# Patient Record
Sex: Male | Born: 1955 | Race: White | Hispanic: No | Marital: Married | Smoking: Former smoker
Health system: Southern US, Community
[De-identification: ages and names within clinical notes are randomized; demographics above are authoritative.]

## PROBLEM LIST (undated history)

## (undated) ENCOUNTER — Emergency Department

## (undated) DIAGNOSIS — E785 Hyperlipidemia, unspecified: Secondary | ICD-10-CM

## (undated) DIAGNOSIS — I2510 Atherosclerotic heart disease of native coronary artery without angina pectoris: Secondary | ICD-10-CM

## (undated) DIAGNOSIS — Z974 Presence of external hearing-aid: Secondary | ICD-10-CM

## (undated) DIAGNOSIS — L219 Seborrheic dermatitis, unspecified: Secondary | ICD-10-CM

## (undated) DIAGNOSIS — H903 Sensorineural hearing loss, bilateral: Secondary | ICD-10-CM

## (undated) DIAGNOSIS — I5189 Other ill-defined heart diseases: Secondary | ICD-10-CM

## (undated) DIAGNOSIS — I340 Nonrheumatic mitral (valve) insufficiency: Secondary | ICD-10-CM

## (undated) DIAGNOSIS — E43 Unspecified severe protein-calorie malnutrition: Secondary | ICD-10-CM

## (undated) DIAGNOSIS — R1310 Dysphagia, unspecified: Secondary | ICD-10-CM

## (undated) DIAGNOSIS — M0579 Rheumatoid arthritis with rheumatoid factor of multiple sites without organ or systems involvement: Secondary | ICD-10-CM

## (undated) DIAGNOSIS — J84112 Idiopathic pulmonary fibrosis: Secondary | ICD-10-CM

## (undated) DIAGNOSIS — M359 Systemic involvement of connective tissue, unspecified: Secondary | ICD-10-CM

## (undated) DIAGNOSIS — J432 Centrilobular emphysema: Secondary | ICD-10-CM

## (undated) DIAGNOSIS — B749 Filariasis, unspecified: Secondary | ICD-10-CM

## (undated) DIAGNOSIS — J439 Emphysema, unspecified: Secondary | ICD-10-CM

## (undated) DIAGNOSIS — R0600 Dyspnea, unspecified: Secondary | ICD-10-CM

## (undated) DIAGNOSIS — M1990 Unspecified osteoarthritis, unspecified site: Secondary | ICD-10-CM

## (undated) DIAGNOSIS — J449 Chronic obstructive pulmonary disease, unspecified: Secondary | ICD-10-CM

## (undated) DIAGNOSIS — J849 Interstitial pulmonary disease, unspecified: Secondary | ICD-10-CM

## (undated) DIAGNOSIS — J99 Respiratory disorders in diseases classified elsewhere: Secondary | ICD-10-CM

## (undated) HISTORY — DX: Unspecified osteoarthritis, unspecified site: M19.90

## (undated) HISTORY — PX: SINUS SURGERY WITH INSTATRAK: SHX5215

---

## 1995-02-26 DIAGNOSIS — F17200 Nicotine dependence, unspecified, uncomplicated: Secondary | ICD-10-CM | POA: Insufficient documentation

## 2003-03-30 DIAGNOSIS — Z9889 Other specified postprocedural states: Secondary | ICD-10-CM | POA: Insufficient documentation

## 2003-03-30 DIAGNOSIS — R079 Chest pain, unspecified: Secondary | ICD-10-CM | POA: Insufficient documentation

## 2006-06-29 ENCOUNTER — Emergency Department: Payer: Self-pay | Admitting: Emergency Medicine

## 2017-05-07 ENCOUNTER — Ambulatory Visit (INDEPENDENT_AMBULATORY_CARE_PROVIDER_SITE_OTHER): Payer: BC Managed Care – PPO | Admitting: Family Medicine

## 2017-05-07 ENCOUNTER — Encounter: Payer: Self-pay | Admitting: Family Medicine

## 2017-05-07 VITALS — BP 135/87 | HR 61 | Temp 98.6°F | Resp 16 | Ht 71.0 in | Wt 185.6 lb

## 2017-05-07 DIAGNOSIS — M150 Primary generalized (osteo)arthritis: Secondary | ICD-10-CM

## 2017-05-07 DIAGNOSIS — Z1159 Encounter for screening for other viral diseases: Secondary | ICD-10-CM | POA: Diagnosis not present

## 2017-05-07 DIAGNOSIS — Z125 Encounter for screening for malignant neoplasm of prostate: Secondary | ICD-10-CM | POA: Diagnosis not present

## 2017-05-07 DIAGNOSIS — Z23 Encounter for immunization: Secondary | ICD-10-CM

## 2017-05-07 DIAGNOSIS — Z7689 Persons encountering health services in other specified circumstances: Secondary | ICD-10-CM

## 2017-05-07 DIAGNOSIS — E7800 Pure hypercholesterolemia, unspecified: Secondary | ICD-10-CM | POA: Diagnosis not present

## 2017-05-07 DIAGNOSIS — R7989 Other specified abnormal findings of blood chemistry: Secondary | ICD-10-CM | POA: Diagnosis not present

## 2017-05-07 DIAGNOSIS — E785 Hyperlipidemia, unspecified: Secondary | ICD-10-CM

## 2017-05-07 DIAGNOSIS — A6000 Herpesviral infection of urogenital system, unspecified: Secondary | ICD-10-CM | POA: Insufficient documentation

## 2017-05-07 DIAGNOSIS — A6002 Herpesviral infection of other male genital organs: Secondary | ICD-10-CM | POA: Diagnosis not present

## 2017-05-07 DIAGNOSIS — R799 Abnormal finding of blood chemistry, unspecified: Secondary | ICD-10-CM | POA: Diagnosis not present

## 2017-05-07 DIAGNOSIS — Z87891 Personal history of nicotine dependence: Secondary | ICD-10-CM

## 2017-05-07 DIAGNOSIS — Z114 Encounter for screening for human immunodeficiency virus [HIV]: Secondary | ICD-10-CM | POA: Diagnosis not present

## 2017-05-07 DIAGNOSIS — M159 Polyosteoarthritis, unspecified: Secondary | ICD-10-CM | POA: Insufficient documentation

## 2017-05-07 MED ORDER — ASPIRIN EC 81 MG PO TBEC: 81.0000 mg | DELAYED_RELEASE_TABLET | Freq: Every day | ORAL | Status: DC

## 2017-05-07 NOTE — Assessment & Plan Note (Signed)
Stable, uncomplicated DC'd Acyclovir since not using anymore, no flares Follow-up or notify office if recurrence can send acyclovir or topical if need

## 2017-05-07 NOTE — Assessment & Plan Note (Signed)
Encourage remain smoke / tobacco free Counseling on future lung CA screening low dose CT - meets criteria age 63-74, >30 pack year smoking history, quit 2007, less than 15 years - given patient information to contact [facility] for arranging if interested

## 2017-05-07 NOTE — Progress Notes (Signed)
Subjective:    Patient ID: Lawrence Wells, male    DOB: [birth year], 61 y.o.   MRN: [id number]  Lawrence Wells is a 61 y.o. male presenting on 05/07/2017 for Arthritis and Establish Care  Here to establish care with new PCP locally now that he is retired from working at [employer]. He was previously followed by [place] (Dr Sandford Craze).  HPI   Osteoarthritis Multiple joints, R sided (Knee, Shoulder, elbow) - Reports chronic history of arthritis diagnosed before, has had old injuries, and overuse/repetitive activities with work in [employer], he has been followed by prior PCP and also [employer] for this in past. Has had multiple imaging with X-rays, and last MRI R Knee. No prior surgeries. Has had multiple joint injections R shoulder and multiple in R knee in past some mixed results. 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 No longer taking acyclovir PRN.  Hyperlipidemia / Family History of Heart Disease / MI - last lipid >3-4 years ago, do not have results - Reports no personal cardiac history but has family history both parents passed from MI ages 80 father and 76 mother, other sibling has heart problem. - He has never taken cholesterol med or baby aspirin - Takes MVI daily. Fish Oil stopped recently, was told ineffective - No fam history DM. No personal history of elevated blood sugar - Diet balanced, not following  particular diet - Exercise - walk on treadmill 20-33min 3-4 times weekly at gym  Health Maintenance: - Due for TDap, will receive today - Due for Flu Shot, declines today despite counseling - Due for routine HIV and Hep C screening, will get with lab draw today - Due for colon cancer screening - not interested today - Interested in low dose CT for lung CA screening, at age 38, 1.5 packs per year >30 year smoker and 1 can per day >20-30 years smokeless tobacco user  Past Medical History:  Diagnosis Date  . Arthritis    Past Surgical History:  Procedure Laterality Date  . SINUS SURGERY WITH INSTATRAK     Social History   Social History  . Marital status: Married    Spouse name: Tellis Spivak  . Number of children: N/A  . Years of education: High School   Occupational History  . 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 Smoking status: Former Smoker    Packs/day: 1.50    Years: 30.00    Types: Cigarettes    Quit date: 2007  . Smokeless tobacco: Former Systems developer    Types: [place] date: 2007     Comment: Dip smokeless tobacco >20-30 years  . Alcohol use Yes  . Drug use: No  . Sexual activity: Not on file   Other Topics Concern  . Not on file   Social History  Narrative  . No narrative on file   Family History  Problem Relation Age of Onset  . Heart disease Mother   . Heart attack Mother 39  . Heart disease Father   . Heart attack Father 55  . Hyperlipidemia Brother   . Throat cancer Brother 60   No current outpatient prescriptions on file prior to visit.   No current facility-administered medications on file prior to visit.     Review of Systems  Constitutional: Negative for activity change, appetite change, chills, diaphoresis, fatigue and fever.  HENT: Positive for hearing loss (Gradual R ear). Negative for congestion, sinus pressure, tinnitus and voice change.   Eyes: Negative for visual disturbance.  Respiratory:  Negative for apnea, cough, choking, chest tightness, shortness of breath and wheezing.   Cardiovascular: Negative for chest pain, palpitations and leg swelling.  Gastrointestinal: Negative for abdominal pain, anal bleeding, blood in stool, constipation, diarrhea, nausea and vomiting.  Endocrine: Negative for cold intolerance and polyuria.  Genitourinary: Negative for difficulty urinating, dysuria, frequency, hematuria and urgency.       Nocturia 1x nightly  Musculoskeletal: Positive for arthralgias (R shoulder, elbow, hip, knee). Negative for back pain, myalgias and neck pain.  Skin: Negative for rash.  Allergic/Immunologic: Negative for environmental allergies.  Neurological: Negative for dizziness, weakness, light-headedness, numbness and headaches.  Hematological: Negative for adenopathy.  Psychiatric/Behavioral: Negative for behavioral problems, dysphoric mood and sleep disturbance. The patient is not nervous/anxious.    Per HPI unless specifically indicated above     Objective:    BP 135/87   Pulse 61   Temp 98.6 F (37 C) (Oral)   Resp 16   Ht 5\' 11"  (1.803 m)   Wt 185 lb 9.6 oz (84.2 kg)   BMI 25.89 kg/m   Wt Readings from Last 3 Encounters:  05/07/17 185 lb 9.6 oz (84.2 kg)    Physical Exam  Constitutional: He is oriented to person, place, and time. He appears well-developed and well-nourished. No distress.  Well-appearing, comfortable, cooperative  HENT:  Head: Normocephalic and atraumatic.  Mouth/Throat: Oropharynx is clear and moist.  Frontal / maxillary sinuses non-tender. Nares patent without purulence or edema. Bilateral TMs clear without erythema, effusion or bulging. Oropharynx clear without erythema, exudates, edema or asymmetry.  Eyes: Pupils are equal, round, and reactive to light. Conjunctivae and EOM are normal. Right eye exhibits no discharge. Left eye exhibits no discharge.  Neck: Normal range of motion. Neck supple. No thyromegaly present.  No carotid  bruits  Cardiovascular: Normal rate, regular rhythm, normal heart sounds and intact distal pulses.   No murmur heard. Pulmonary/Chest: Effort normal and breath sounds normal. No respiratory distress. He has no wheezes. He has no rales.  Abdominal: Soft. Bowel sounds are normal. He exhibits no distension and no mass. There is no tenderness.  Musculoskeletal: Normal range of motion. He exhibits no edema or tenderness.  Upper / Lower Extremities: - Normal muscle tone, strength bilateral upper extremities 5/5, lower extremities 5/5  No significant joint deformity or effusion. Mild bulky appearance R>L Knee  Lymphadenopathy:    He has no cervical adenopathy.  Neurological: He is alert and oriented to person, place, and time.  Distal sensation intact to light touch all extremities  Skin: Skin is warm and dry. No rash noted. He is not diaphoretic. No erythema.  Psychiatric: He has a normal mood and affect. His behavior is normal.  Well groomed, good eye contact, normal speech and thoughts  Nursing note and vitals  reviewed.  No results found for this or any previous visit.    Assessment & Plan:   Problem List Items Addressed This Visit    Screening for prostate cancer    Low / Avg risk patient, with prior reported negative screening - Last PSA unknown - No prior DRE - Clinically asymptomatic  Plan: 1. Reviewed prostate cancer screening guidelines and risks including potential prostate biopsy if abnormal PSA, proceed with yearly PSA for now, and anticipate DRE as needed especially if abnormal PSA or new symptoms      Relevant Orders   PSA, Total with Reflex to PSA, Free   Osteoarthritis of multiple joints - Primary    Stable, chronic arthritis in multiple joints, mostly R sided knee, elbow, shoulder Known OA/DJD, prior x-rays and MRI R knee, prior injections, last seen [facility] 2 years ago Inadequate conservative therapy  Plan: 1. Discussed arthritis management, prognosis 2.  Recommend increased Tylenol dosing Ext Str / Arthritis str 1-2 tabs up to TID more often for daily symptoms, and then use NSAID in future for PRN flares only, can use Ibuprofen or Aleve OTC 3. Future consider rx NSAID meloxicam or naproxen for less pill burden, again PRN only use - pending labs to check Cr level 4. Future consider muscle relaxant or other therapies 5. Follow-up as needed, may need X-ray updated, consider future steroid injections knee/shoulder PRN, may follow-up with PT or Ortho if needed      Relevant Medications   ibuprofen (ADVIL,MOTRIN) 200 MG tablet   aspirin EC 81 MG tablet   Other Relevant Orders   COMPLETE METABOLIC PANEL WITH GFR   Hypercholesterolemia    History of HLD Will check fasting lipids today Review ASCD risk and recommendations Today start ASA 81mg  EC daily for reducing risk, future consider statin - especially given fam history CAD/MI      Relevant Medications   aspirin EC 81 MG tablet   Genital herpes    Stable, uncomplicated DC'd Acyclovir since not using anymore, no flares Follow-up or notify office if recurrence can send acyclovir or topical if need      Former smoker    Encourage remain smoke / tobacco free Counseling on future lung CA screening low dose CT - meets criteria age 61-74, >30 pack year smoking history, quit 2007, less than 15 years - given patient information to contact [facility] for arranging if interested       Other Visit Diagnoses    Encounter to establish care with new doctor       Need for diphtheria-tetanus-pertussis (Tdap) vaccine       Relevant Orders   Tdap vaccine greater than or equal to 7yo IM (Completed)   Dyslipidemia       Relevant Orders   Hemoglobin A1c   Lipid panel   Abnormal CBC       Relevant Orders   CBC with Differential/Platelet   Abnormal blood chemistry       Relevant Orders   COMPLETE METABOLIC PANEL WITH GFR   Hemoglobin A1c   Screening for HIV (human immunodeficiency virus)        Relevant Orders   HIV antibody   Need for hepatitis C screening test       Relevant Orders   Hepatitis C antibody      Meds ordered this encounter  Medications  . DISCONTD: acyclovir (ZOVIRAX) 800 MG tablet    Sig: Take 800 mg by mouth.  . Multiple Vitamin (MULTIVITAMIN) tablet  Sig: Take 1 tablet by mouth daily.  Marland Kitchen ibuprofen (ADVIL,MOTRIN) 200 MG tablet    Sig: Take 200 mg by mouth every 6 (six) hours as needed.  Marland Kitchen aspirin EC 81 MG tablet    Sig: Take 1 tablet (81 mg total) by mouth daily.    Follow up plan: Return in about 1 year (around 05/07/2018) for Annual Physical.  Nobie Putnam, DO [facility] 05/07/2017, 11:02 AM

## 2017-05-07 NOTE — Assessment & Plan Note (Signed)
Low / Avg risk patient, with prior reported negative screening - Last PSA unknown - No prior DRE - Clinically asymptomatic  Plan: 1. Reviewed prostate cancer screening guidelines and risks including potential prostate biopsy if abnormal PSA, proceed with yearly PSA for now, and anticipate DRE as needed especially if abnormal PSA or new symptoms

## 2017-05-07 NOTE — Assessment & Plan Note (Signed)
History of HLD Will check fasting lipids today Review ASCD risk and recommendations Today start ASA 81mg  EC daily for reducing risk, future consider statin - especially given fam history CAD/MI

## 2017-05-07 NOTE — Assessment & Plan Note (Signed)
Stable, chronic arthritis in multiple joints, mostly R sided knee, elbow, shoulder Known OA/DJD, prior x-rays and MRI R knee, prior injections, last seen [facility] 2 years ago Inadequate conservative therapy  Plan: 1. Discussed arthritis management, prognosis 2. Recommend increased Tylenol dosing Ext Str / Arthritis str 1-2 tabs up to TID more often for daily symptoms, and then use NSAID in future for PRN flares only, can use Ibuprofen or Aleve OTC 3. Future consider rx NSAID meloxicam or naproxen for less pill burden, again PRN only use - pending labs to check Cr level 4. Future consider muscle relaxant or other therapies 5. Follow-up as needed, may need X-ray updated, consider future steroid injections knee/shoulder PRN, may follow-up with PT or Ortho if needed

## 2017-05-07 NOTE — Patient Instructions (Addendum)
Thank you for coming to the clinic today.  1. Will check all screening blood work today, and forward you lab results through [place]  2. May continue Multivitamin daily, do not need fish oil at this time.  3. Recommend to start taking Tylenol Extra Strength 500mg  tabs - take 1 to 2 tabs per dose (max 1000mg ) every 6-8 hours for pain (take regularly, don't skip a dose for next 7 days), max 24 hour daily dose is 6 tablets or 3000mg . In the future you can repeat the same everyday Tylenol course for 1-2 weeks at a time.   Safe to take with Aleve or Ibuprofen - pick your preference, only use one of these as needed for few days, then stop. Only if FLARE of arthritis. In future can change to rx version of these if needed.  4. TDap tetanus shot today, good for 10 years  Consider Low Dose CT Scan for lung cancer screening.  Low Dose Chest CT Lung CA Screening - Age 3-74 - Smoking history >30 pack years - Annual screening for 15 years after quit date  [facility]) [place], RN, OCN [phone]  [place] Smoking Cessation Class Ph: [phone]  Please schedule a Follow-up Appointment to: Return in about 1 year (around 05/07/2018) for Annual Physical.  If you have any other questions or concerns, please feel free to call the clinic or send a message through [place]. You may also schedule an earlier appointment if necessary.  Additionally, you may be receiving a survey about your experience at our clinic within a few days to 1 week by e-mail or mail. We value your feedback.  Lawrence Putnam, DO [place]

## 2017-05-08 LAB — LIPID PANEL
Cholesterol: 259 mg/dL — ABNORMAL HIGH (ref <200)
HDL: 76 mg/dL (ref >40)
LDL Cholesterol (Calc): 156 mg/dL (calc) — ABNORMAL HIGH
Non-HDL Cholesterol (Calc): 183 mg/dL (calc) — ABNORMAL HIGH (ref <130)
TRIGLYCERIDES: 138 mg/dL (ref <150)
Total CHOL/HDL Ratio: 3.4 (calc) (ref <5.0)

## 2017-05-08 LAB — HEMOGLOBIN A1C
EAG (MMOL/L): 6.2 (calc)
Hgb A1c MFr Bld: 5.5 % of total Hgb (ref <5.7)
MEAN PLASMA GLUCOSE: 111 (calc)

## 2017-05-08 LAB — CBC WITH DIFFERENTIAL/PLATELET
BASOS ABS: 69 {cells}/uL (ref 0–200)
Basophils Relative: 0.7 %
Eosinophils Absolute: 238 cells/uL (ref 15–500)
Eosinophils Relative: 2.4 %
HEMATOCRIT: 46.3 % (ref 38.5–50.0)
Hemoglobin: 15.9 g/dL (ref 13.2–17.1)
LYMPHS ABS: 2831 {cells}/uL (ref 850–3900)
MCH: 31.8 pg (ref 27.0–33.0)
MCHC: 34.3 g/dL (ref 32.0–36.0)
MCV: 92.6 fL (ref 80.0–100.0)
MPV: 9.5 fL (ref 7.5–12.5)
Monocytes Relative: 8.2 %
NEUTROS PCT: 60.1 %
Neutro Abs: 5950 cells/uL (ref 1500–7800)
Platelets: 333 10*3/uL (ref 140–400)
RBC: 5 10*6/uL (ref 4.20–5.80)
RDW: 12 % (ref 11.0–15.0)
Total Lymphocyte: 28.6 %
WBC mixed population: 812 cells/uL (ref 200–950)
WBC: 9.9 10*3/uL (ref 3.8–10.8)

## 2017-05-08 LAB — HIV ANTIBODY (ROUTINE TESTING W REFLEX): HIV: NONREACTIVE

## 2017-05-08 LAB — COMPLETE METABOLIC PANEL WITH GFR
AG RATIO: 1.4 (calc) (ref 1.0–2.5)
ALT: 17 U/L (ref 9–46)
AST: 21 U/L (ref 10–35)
Albumin: 4.1 g/dL (ref 3.6–5.1)
Alkaline phosphatase (APISO): 95 U/L (ref 40–115)
BUN: 19 mg/dL (ref 7–25)
CALCIUM: 9.5 mg/dL (ref 8.6–10.3)
CHLORIDE: 102 mmol/L (ref 98–110)
CO2: 24 mmol/L (ref 20–32)
Creat: 0.98 mg/dL (ref 0.70–1.25)
GFR, EST NON AFRICAN AMERICAN: 82 mL/min/{1.73_m2} (ref >=60)
GFR, Est African American: 95 mL/min/{1.73_m2} (ref >=60)
Globulin: 3 g/dL (calc) (ref 1.9–3.7)
Glucose, Bld: 89 mg/dL (ref 65–99)
POTASSIUM: 4.6 mmol/L (ref 3.5–5.3)
Sodium: 137 mmol/L (ref 135–146)
Total Bilirubin: 0.4 mg/dL (ref 0.2–1.2)
Total Protein: 7.1 g/dL (ref 6.1–8.1)

## 2017-05-08 LAB — PSA, TOTAL WITH REFLEX TO PSA, FREE: PSA, TOTAL: 0.6 ng/mL (ref <=4.0)

## 2017-05-08 LAB — HEPATITIS C ANTIBODY
HEP C AB: NONREACTIVE
SIGNAL TO CUT-OFF: 0.04 (ref <1.00)

## 2017-05-09 ENCOUNTER — Other Ambulatory Visit: Payer: Self-pay | Admitting: Family Medicine

## 2017-05-09 ENCOUNTER — Encounter: Payer: Self-pay | Admitting: Family Medicine

## 2017-05-09 DIAGNOSIS — E7800 Pure hypercholesterolemia, unspecified: Secondary | ICD-10-CM

## 2017-05-09 MED ORDER — ROSUVASTATIN CALCIUM 5 MG PO TABS: 5.0000 mg | ORAL_TABLET | Freq: Every day | ORAL | 11 refills | Status: DC

## 2017-06-09 ENCOUNTER — Ambulatory Visit
Admission: RE | Admit: 2017-06-09 | Discharge: 2017-06-09 | Disposition: A | Discharge status: Home / Self Care | Payer: BC Managed Care – PPO | Source: Ambulatory Visit | Attending: Family Medicine | Admitting: Family Medicine

## 2017-06-09 ENCOUNTER — Encounter: Payer: Self-pay | Admitting: Family Medicine

## 2017-06-09 ENCOUNTER — Ambulatory Visit (INDEPENDENT_AMBULATORY_CARE_PROVIDER_SITE_OTHER): Payer: BC Managed Care – PPO | Admitting: Family Medicine

## 2017-06-09 VITALS — BP 124/77 | HR 61 | Temp 98.1°F | Resp 16 | Ht 71.0 in | Wt 186.0 lb

## 2017-06-09 DIAGNOSIS — M5442 Lumbago with sciatica, left side: Secondary | ICD-10-CM | POA: Diagnosis not present

## 2017-06-09 DIAGNOSIS — M48061 Spinal stenosis, lumbar region without neurogenic claudication: Secondary | ICD-10-CM | POA: Insufficient documentation

## 2017-06-09 DIAGNOSIS — M150 Primary generalized (osteo)arthritis: Secondary | ICD-10-CM | POA: Diagnosis not present

## 2017-06-09 DIAGNOSIS — M25551 Pain in right hip: Secondary | ICD-10-CM | POA: Diagnosis not present

## 2017-06-09 DIAGNOSIS — G8929 Other chronic pain: Secondary | ICD-10-CM

## 2017-06-09 DIAGNOSIS — M5441 Lumbago with sciatica, right side: Secondary | ICD-10-CM

## 2017-06-09 DIAGNOSIS — M25552 Pain in left hip: Secondary | ICD-10-CM | POA: Insufficient documentation

## 2017-06-09 DIAGNOSIS — M47895 Other spondylosis, thoracolumbar region: Secondary | ICD-10-CM | POA: Insufficient documentation

## 2017-06-09 DIAGNOSIS — M4807 Spinal stenosis, lumbosacral region: Secondary | ICD-10-CM | POA: Diagnosis not present

## 2017-06-09 DIAGNOSIS — I700 Atherosclerosis of aorta: Secondary | ICD-10-CM | POA: Diagnosis not present

## 2017-06-09 DIAGNOSIS — M159 Polyosteoarthritis, unspecified: Secondary | ICD-10-CM

## 2017-06-09 DIAGNOSIS — J984 Other disorders of lung: Secondary | ICD-10-CM | POA: Diagnosis not present

## 2017-06-09 MED ORDER — PREDNISONE 20 MG PO TABS: ORAL_TABLET | ORAL | 0 refills | Status: DC

## 2017-06-09 MED ORDER — MELOXICAM 15 MG PO TABS: 15.0000 mg | ORAL_TABLET | Freq: Every day | ORAL | 2 refills | Status: DC | PRN

## 2017-06-09 NOTE — Patient Instructions (Addendum)
Thank you for coming to the clinic today.  1.   For your Back and Hip Pain - I think that this is due to Muscle Spasms or strain. Your Sciatic Nerve can be affected causing some of your radiation and numbness down your legs. - Also can affect your Piriformis  - X-ray hips and low back today, stay tuned for more information  2. Start Prednisone (steroid anti-inflammatory) taper over 7 days - HOLD Meloxicam (and all other ibuprofen aleve etc) while on prednisone - Once finished Prednisone then start new rx Meloxicam 15mg  once daily AS NEEDED for flare likely up to 2 weeks to start then as needed. Take with food.  Safe to take with Tylenol Extra Str 500mg  tabs - may take 1-2 tablets per dose every 6-8 hours or 3 times daily, take this regularly for now then as needed  3.  Recommend to start using heating pad on your lower back 1-2x daily for few weeks  This pain may take weeks to months to fully resolve, but hopefully it will respond to the medicine initially. All back injuries (small or serious) are slow to heal since we use our back muscles every day. Be careful with turning, twisting, lifting, sitting / standing for prolonged periods, and avoid re-injury.  If your symptoms significantly worsen with more pain, or new symptoms with weakness in one or both legs, new or different shooting leg pains, numbness in legs or groin, loss of control or retention of urine or bowel movements, please call back for advice and you may need to go directly to the Emergency Department.  Please schedule a Follow-up Appointment to: Return in about 3 months (around 09/09/2017) for arthritis.  If you have any other questions or concerns, please feel free to call the clinic or send a message through [place]. You may also schedule an earlier appointment if necessary.  Additionally, you may be receiving a survey about your experience at our clinic within a few days to 1 week by e-mail or mail. We value your  feedback.  Nobie Putnam, DO [place]

## 2017-06-09 NOTE — Assessment & Plan Note (Signed)
Presumed etiology for underlying bilateral hip and low back pain, with known DJD and history is characteristic of OA without any known other injury - See A&P per hip/back pain

## 2017-06-09 NOTE — Progress Notes (Signed)
Subjective:    Patient ID: Lawrence Wells, male    DOB: Dec 11, 1955, 61 y.o.   MRN: [id number]  Lawrence Wells is a 61 y.o. male presenting on 06/09/2017 for Hip Pain (more on Right side and rediate to both legs onset 2 weeks really worst)   HPI   R > L Hip Pain / Low Back Pain, Chronic / FOLLOW-UP OSTEOARTHRITIS Multiple joints - Last visit with me 05/07/17, for initial visit for similar problem with OA/DJD and joint pain, treated with labs for baseline function and recommend conservative inc dose Tylenol and other treatments, see prior notes for background information. - Interval update with labs were unremarkable, he has admitted to taking frequent Meloxicam 15mg  dose from his wife's rx as needed with notable improvement, as it was not improving on other conservative medications - Today patient reports now recent flare past 1-2 weeks with gradual worsening bilateral low back pain with radiating into hips and back of legs with more sharp stabbing pain brief episodes, question sciatica, some discomfort in gluteal regions, worse with prolonged sitting even for few minutes, improves with standing and change positions, has to find comfortable position, worse long car ride. Last few days sleep on flat back, worse on R side - He is not taking Tylenol regularly - No longer followed by Ortho, in past [facility] only - Known osteoarthritis in multiple joints, no recent hip or back x-rays - Denies any fevers/chills, redness swelling, fall trauma injury, numbness weakness tingling, saddle anesthesia, bowel or bladder incontinence  Additional PMH - has some soreness and stinging in R inguinal area, no bulging, he is asking about possible hernia at end of visit.  Health Maintenance: - Due for Flu Shot, declines today despite counseling on benefits  Depression screen PHQ 2/9 05/07/2017  Decreased Interest 0  Down, Depressed, Hopeless 0  PHQ - 2 Score 0    Social History  Substance Use Topics  .  Smoking status: Former Smoker    Packs/day: 1.50    Years: 30.00    Types: Cigarettes    Quit date: 2007  . Smokeless tobacco: Former Systems developer    Types: [place] date: 2007     Comment: Dip smokeless tobacco >20-30 years  . Alcohol use Yes    Review of Systems Per HPI unless specifically indicated above     Objective:    BP 124/77   Pulse 61   Temp 98.1 F (36.7 C) (Oral)   Resp 16   Ht 5\' 11"  (1.803 m)   Wt 186 lb (84.4 kg)   BMI 25.94 kg/m   Wt Readings from Last 3 Encounters:  06/09/17 186 lb (84.4 kg)  05/07/17 185 lb 9.6 oz (84.2 kg)    Physical Exam  Constitutional: He is oriented to person, place, and time. He appears well-developed and well-nourished. No distress.  Well-appearing, comfortable, cooperative  HENT:  Head: Normocephalic and atraumatic.  Mouth/Throat: Oropharynx is clear and moist.  Eyes: Pupils are equal, round, and reactive to light. Conjunctivae and EOM are normal. Right eye exhibits no discharge. Left eye exhibits no discharge.  Neck: Normal range of motion.  Cardiovascular: Normal rate, regular rhythm, normal heart sounds and intact distal pulses.   No murmur heard. Pulmonary/Chest: Effort normal.  Musculoskeletal: Normal range of motion. He exhibits no edema.  Upper / Lower Extremities: - Normal muscle tone, strength bilateral upper extremities 5/5, lower extremities 5/5  Low Back / Bilateral hip and Greater Trochanter  Inspection: BACK - Normal appearance, no spinal deformity, symmetrical. HIP - Normal appearance, symmetrical, no obvious leg length or pelvis deformity  Palpation: BACK - No tenderness over spinous processes. Bilateral lumbar paraspinal muscles non-tender and without hypertonicity/spasm. Some discomfort on palpation over bilateral piriformis gluteal regions HIP - Hips nontender. Lower extremity thigh calf soft non tender no spasm.  ROM: BACK - Full active ROM forward flex / back extension, rotation L/R without  discomfort HIP - Bilateral hip flex/ext supine normal, internal and external rotation normal without problem or limitation.  Special Testing: BACK - Seated SLR negative for radicular pain bilaterally but with R>L tightness in posterior thigh legs HIP - Full hip ROM was not completed today  Strength: Bilateral hip flex/ext 5/5, knee flex/ext 5/5, ankle dorsiflex/plantarflex 5/5 Neurovascular: intact distal sensation to light touch  Neurological: He is alert and oriented to person, place, and time.  Distal sensation intact to light touch all extremities  Skin: Skin is warm and dry. No rash noted. He is not diaphoretic. No erythema.  Psychiatric: He has a normal mood and affect. His behavior is normal.  Well groomed, good eye contact, normal speech and thoughts  Nursing note and vitals reviewed.    I have personally reviewed the radiology report from Lumbar Spine X-ray and Bilateral Hip X-rays on 06/09/17.  CLINICAL DATA: 61 year old male with chronic bilateral hip pain worse on the right. Symptoms progressing over the past 3 weeks. Pain extends down back legs no injury. Initial encounter.  EXAM: LUMBAR SPINE - COMPLETE 4+ VIEW  COMPARISON: None.  FINDINGS: Minimal curvature convex right.  Mild L4-5 and mild to moderate L5-S1 disc space narrowing.  Mild degenerative changes T12-L1.  Significant lung disease incompletely assessed on present exam.  Vascular calcifications.  IMPRESSION: Mild L4-5 and mild to moderate L5-S1 disc space narrowing.  Mild degenerative changes T12-L1.  Significant lung disease incompletely assessed on present exam.  Aortic Atherosclerosis (ICD10-I70.0).   Electronically Signed By: Genia Del M.D. On: 06/09/2017 18:21 ------------------------------------- CLINICAL DATA:  61 year old male with chronic bilateral hip pain worse on the right. Symptoms progressing over the past 3 weeks. Pain extends down back legs no injury. Initial  encounter.  EXAM: DG HIP (WITH OR WITHOUT PELVIS) 2-3V LEFT  COMPARISON:  None.  FINDINGS: Minimal bilateral hip joint degenerative changes.  No evidence of left femoral head avascular necrosis.  No fracture or dislocation.  IMPRESSION: Minimal bilateral hip joint degenerative changes.   Electronically Signed   By: Genia Del M.D.   On: 06/09/2017 18:23  ----------------------------------------- CLINICAL DATA:  61 year old male with chronic bilateral hip pain worse on the right. Symptoms progressing over the past 3 weeks. Pain extends down back legs no injury. Initial encounter.  EXAM: DG HIP (WITH OR WITHOUT PELVIS) 2-3V RIGHT  COMPARISON:  Left hip films same date dictated separately.  FINDINGS: Minimal right hip joint degenerative changes. No plain film evidence of right femoral head avascular necrosis.  No fracture or dislocation.  IMPRESSION: Minimal right hip joint degenerative changes.   Electronically Signed   By: Genia Del M.D.   On: 06/09/2017 18:25   Results for orders placed or performed in visit on 05/07/17  COMPLETE METABOLIC PANEL WITH GFR  Result Value Ref Range   Glucose, Bld 89 65 - 99 mg/dL   BUN 19 7 - 25 mg/dL   Creat 0.98 0.70 - 1.25 mg/dL   GFR, Est Non African American 82 > OR = 60 mL/min/1.48m2   GFR, Est African  American 95 > OR = 60 mL/min/1.81m2   BUN/Creatinine Ratio NOT APPLICABLE 6 - 22 (calc)   Sodium 137 135 - 146 mmol/L   Potassium 4.6 3.5 - 5.3 mmol/L   Chloride 102 98 - 110 mmol/L   CO2 24 20 - 32 mmol/L   Calcium 9.5 8.6 - 10.3 mg/dL   Total Protein 7.1 6.1 - 8.1 g/dL   Albumin 4.1 3.6 - 5.1 g/dL   Globulin 3.0 1.9 - 3.7 g/dL (calc)   AG Ratio 1.4 1.0 - 2.5 (calc)   Total Bilirubin 0.4 0.2 - 1.2 mg/dL   Alkaline phosphatase (APISO) 95 40 - 115 U/L   AST 21 10 - 35 U/L   ALT 17 9 - 46 U/L  CBC with Differential/Platelet  Result Value Ref Range   WBC 9.9 3.8 - 10.8 Thousand/uL   RBC 5.00  4.20 - 5.80 Million/uL   Hemoglobin 15.9 13.2 - 17.1 g/dL   HCT 46.3 38.5 - 50.0 %   MCV 92.6 80.0 - 100.0 fL   MCH 31.8 27.0 - 33.0 pg   MCHC 34.3 32.0 - 36.0 g/dL   RDW 12.0 11.0 - 15.0 %   Platelets 333 140 - 400 Thousand/uL   MPV 9.5 7.5 - 12.5 fL   Neutro Abs 5,950 1,500 - 7,800 cells/uL   Lymphs Abs 2,831 850 - 3,900 cells/uL   WBC mixed population 812 200 - 950 cells/uL   Eosinophils Absolute 238 15 - 500 cells/uL   Basophils Absolute 69 0 - 200 cells/uL   Neutrophils Relative % 60.1 %   Total Lymphocyte 28.6 %   Monocytes Relative 8.2 %   Eosinophils Relative 2.4 %   Basophils Relative 0.7 %  Hemoglobin A1c  Result Value Ref Range   Hgb A1c MFr Bld 5.5 <5.7 % of total Hgb   Mean Plasma Glucose 111 (calc)   eAG (mmol/L) 6.2 (calc)  Lipid panel  Result Value Ref Range   Cholesterol 259 (H) <200 mg/dL   HDL 76 >40 mg/dL   Triglycerides 138 <150 mg/dL   LDL Cholesterol (Calc) 156 (H) mg/dL (calc)   Total CHOL/HDL Ratio 3.4 <5.0 (calc)   Non-HDL Cholesterol (Calc) 183 (H) <130 mg/dL (calc)  HIV antibody  Result Value Ref Range   HIV 1&2 Ab, 4th Generation NON-REACTIVE NON-REACTI  Hepatitis C antibody  Result Value Ref Range   Hepatitis C Ab NON-REACTIVE NON-REACTI   SIGNAL TO CUT-OFF 0.04 <1.00  PSA, Total with Reflex to PSA, Free  Result Value Ref Range   PSA, Total 0.6 < OR = 4.0 ng/mL      Assessment & Plan:   Problem List Items Addressed This Visit    Chronic bilateral low back pain with bilateral sciatica    Subacute on chronic b/l LBP with associated b/l sciatica. Suspect likely due to muscle spasm/strain and arthritis without known injury or trauma.   No red flag symptoms. Negative SLR for radiculopathy - Inadequate conservative therapy   Plan: - Check lumbar sppine X-rays for baseline - reviewed with mild L4-5 and moderate L5-S1 with disc space narrowing 1. Start Prednisone burst/taper x 7 days - primarily for LBP with sciatica bilateral - HOLD NSAID  until finish prednisone 2. Start new rx Meloxicam 15mg  daily 2-4 weeks then PRN, avoid other NSAIDs 3. Start regular dosing Tylenol Ext Str 500mg  tabs x 2 for 1000mg  up to TID regularly then PRN 4. Considered muscle relaxant vs gabapentin will hold for now 5. Follow-up 6 weeks  to 3 months, consider future referral to PT vs return to ortho      Relevant Medications   predniSONE (DELTASONE) 20 MG tablet   meloxicam (MOBIC) 15 MG tablet   Other Relevant Orders   DG Lumbar Spine Complete (Completed)   Chronic hip pain, bilateral    Subacute on chronic gradual worsening problem with R > L Hip pain, seems more related to LBP and piriformis or sciatica. Not obvious trochanteric bursitis on exam - Known underlying OA/DJD without recent imaging for hips - Suggestive of radicular symptoms - Not responding to conservative therapy  Plan: - Check bilateral hip X-rays and low back for baseline - reviewed with only mild DJD hips 1. Start Prednisone burst/taper x 7 days - primarily for LBP with sciatica bilateral - HOLD NSAID until finish prednisone 2. Start new rx Meloxicam 15mg  daily 2-4 weeks then PRN, avoid other NSAIDs 3. Start regular dosing Tylenol Ext Str 500mg  tabs x 2 for 1000mg  up to TID regularly then PRN 4. Considered muscle relaxant vs gabapentin will hold for now 5. Follow-up 6 weeks to 3 months, consider future troch bursa steroid injection, may return to ortho in future      Relevant Medications   predniSONE (DELTASONE) 20 MG tablet   meloxicam (MOBIC) 15 MG tablet   Other Relevant Orders   DG HIP UNILAT WITH PELVIS 2-3 VIEWS LEFT (Completed)   DG HIP UNILAT WITH PELVIS 2-3 VIEWS RIGHT (Completed)   Osteoarthritis of multiple joints - Primary    Presumed etiology for underlying bilateral hip and low back pain, with known DJD and history is characteristic of OA without any known other injury - See A&P per hip/back pain      Relevant Medications   predniSONE (DELTASONE) 20 MG  tablet   meloxicam (MOBIC) 15 MG tablet   Other Relevant Orders   DG Lumbar Spine Complete (Completed)   DG HIP UNILAT WITH PELVIS 2-3 VIEWS LEFT (Completed)   DG HIP UNILAT WITH PELVIS 2-3 VIEWS RIGHT (Completed)      Meds ordered this encounter  Medications  . predniSONE (DELTASONE) 20 MG tablet    Sig: Take daily with food. Start with 60mg  (3 pills) x 2 days, then reduce to 40mg  (2 pills) x 2 days, then 20mg  (1 pill) x 3 days    Dispense:  13 tablet    Refill:  0  . meloxicam (MOBIC) 15 MG tablet    Sig: Take 1 tablet (15 mg total) by mouth daily as needed for pain. For up to 2 weeks for flares.    Dispense:  30 tablet    Refill:  2    Follow up plan: Return in about 3 months (around 09/09/2017) for arthritis.  Nobie Putnam, DO [facility] 06/09/2017, 10:05 PM

## 2017-06-09 NOTE — Assessment & Plan Note (Signed)
Subacute on chronic gradual worsening problem with R > L Hip pain, seems more related to LBP and piriformis or sciatica. Not obvious trochanteric bursitis on exam - Known underlying OA/DJD without recent imaging for hips - Suggestive of radicular symptoms - Not responding to conservative therapy  Plan: - Check bilateral hip X-rays and low back for baseline - reviewed with only mild DJD hips 1. Start Prednisone burst/taper x 7 days - primarily for LBP with sciatica bilateral - HOLD NSAID until finish prednisone 2. Start new rx Meloxicam 15mg  daily 2-4 weeks then PRN, avoid other NSAIDs 3. Start regular dosing Tylenol Ext Str 500mg  tabs x 2 for 1000mg  up to TID regularly then PRN 4. Considered muscle relaxant vs gabapentin will hold for now 5. Follow-up 6 weeks to 3 months, consider future troch bursa steroid injection, may return to ortho in future

## 2017-06-09 NOTE — Assessment & Plan Note (Signed)
Subacute on chronic b/l LBP with associated b/l sciatica. Suspect likely due to muscle spasm/strain and arthritis without known injury or trauma.   No red flag symptoms. Negative SLR for radiculopathy - Inadequate conservative therapy   Plan: - Check lumbar sppine X-rays for baseline - reviewed with mild L4-5 and moderate L5-S1 with disc space narrowing 1. Start Prednisone burst/taper x 7 days - primarily for LBP with sciatica bilateral - HOLD NSAID until finish prednisone 2. Start new rx Meloxicam 15mg  daily 2-4 weeks then PRN, avoid other NSAIDs 3. Start regular dosing Tylenol Ext Str 500mg  tabs x 2 for 1000mg  up to TID regularly then PRN 4. Considered muscle relaxant vs gabapentin will hold for now 5. Follow-up 6 weeks to 3 months, consider future referral to PT vs return to ortho

## 2017-06-10 ENCOUNTER — Other Ambulatory Visit: Payer: Self-pay | Admitting: Family Medicine

## 2019-07-19 ENCOUNTER — Encounter: Payer: Self-pay | Admitting: Family Medicine

## 2019-07-19 ENCOUNTER — Ambulatory Visit (INDEPENDENT_AMBULATORY_CARE_PROVIDER_SITE_OTHER): Payer: PPO | Admitting: Family Medicine

## 2019-07-19 ENCOUNTER — Other Ambulatory Visit: Payer: Self-pay

## 2019-07-19 VITALS — Ht 71.0 in

## 2019-07-19 DIAGNOSIS — M8949 Other hypertrophic osteoarthropathy, multiple sites: Secondary | ICD-10-CM | POA: Diagnosis not present

## 2019-07-19 DIAGNOSIS — M79641 Pain in right hand: Secondary | ICD-10-CM

## 2019-07-19 DIAGNOSIS — M5441 Lumbago with sciatica, right side: Secondary | ICD-10-CM | POA: Diagnosis not present

## 2019-07-19 DIAGNOSIS — M2560 Stiffness of unspecified joint, not elsewhere classified: Secondary | ICD-10-CM | POA: Diagnosis not present

## 2019-07-19 DIAGNOSIS — M5442 Lumbago with sciatica, left side: Secondary | ICD-10-CM | POA: Diagnosis not present

## 2019-07-19 DIAGNOSIS — M79642 Pain in left hand: Secondary | ICD-10-CM

## 2019-07-19 DIAGNOSIS — E7800 Pure hypercholesterolemia, unspecified: Secondary | ICD-10-CM

## 2019-07-19 DIAGNOSIS — G8929 Other chronic pain: Secondary | ICD-10-CM | POA: Diagnosis not present

## 2019-07-19 DIAGNOSIS — M159 Polyosteoarthritis, unspecified: Secondary | ICD-10-CM

## 2019-07-19 MED ORDER — ROSUVASTATIN CALCIUM 5 MG PO TABS: 5.0000 mg | ORAL_TABLET | Freq: Every day | ORAL | 3 refills | Status: DC

## 2019-07-19 MED ORDER — MELOXICAM 15 MG PO TABS: 15.0000 mg | ORAL_TABLET | Freq: Every day | ORAL | 2 refills | Status: DC | PRN

## 2019-07-19 MED ORDER — DICLOFENAC SODIUM 1 % EX GEL: 2.0000 g | Freq: Three times a day (TID) | CUTANEOUS | 2 refills | Status: DC | PRN

## 2019-07-19 NOTE — Patient Instructions (Addendum)
Most likely you have osteoarthritis wear and tear degenerative joint problem in hands fingers lower extremity and ankles as well.  Recommend resume Meloxicam anti inflammatory as needed only. Caution this can affect or reduce kidney function.  Try topical diclofenac medication as needed as well for episodic symptoms - good for hands fingers  At night try to wear a wrist splint to avoid bending flexing wrists to alleviate some pressure on your carpal tunnel nerve can reduce the numbness.  Restart Rosuvastatin crestor nightly for cholesterol.  In future we can consider referral to Rheumatologist or back to orthopedics to consider further testing if needed.  Reynaud's has other medicines we can consider such as a blood pressure pill if need - but there should be no harm to you from this.   DUE for FASTING BLOOD WORK (no food or drink after midnight before the lab appointment, only water or coffee without cream/sugar on the morning of)  SCHEDULE "Lab Only" visit in the morning at the clinic for lab draw in 3 MONTHS   - Make sure Lab Only appointment is at about 1 week before your next appointment, so that results will be available  For Lab Results, once available within 2-3 days of blood draw, you can can log in to MyChart online to view your results and a brief explanation. Also, we can discuss results at next follow-up visit.     Please schedule a Follow-up Appointment to: Return in about 3 months (around 10/17/2019) for Annual Physical.  If you have any other questions or concerns, please feel free to call the office or send a message through [place]. You may also schedule an earlier appointment if necessary.  Additionally, you may be receiving a survey about your experience at our office within a few days to 1 week by e-mail or mail. We value your feedback.  Nobie Putnam, DO [place]

## 2019-07-19 NOTE — Progress Notes (Signed)
Virtual Visit via Telephone The purpose of this virtual visit is to provide medical care while limiting exposure to the novel coronavirus (COVID19) for both patient and office staff.  Consent was obtained for phone visit:  Yes.   Answered questions that patient had about telehealth interaction:  Yes.   I discussed the limitations, risks, security and privacy concerns of performing an evaluation and management service by telephone. I also discussed with the patient that there may be a patient responsible charge related to this service. The patient expressed understanding and agreed to proceed.  Patient Location: Home Provider Location: [facility])  ---------------------------------------------------------------------- Chief Complaint  Patient presents with  . Hyperlipidemia  . Generalized Body Aches    bilateral hands & finger, ankles and calve muscle sorness x 2 mths, Finger tips go numb and turn white     S: Reviewed CMA documentation. I have called patient and gathered additional HPI as follows:  Osteoarthritis / Joint Aches / L hand numbness Reports past few weeks, he has noticed some swelling in knuckles and fingers, usually stiff in morning and has some numbness in finger tips at times, worse with activity also with cold temperatures. Also legs feel sore and stiff in calves and legs, feels "locked up" difficulty getting moving and going. Also Left foot bones on top of foot tender and sore. - Strong history of osteoarthritis in family history in hands as well. Unsure if rheumatoid arthritis - He was advised by nurse that could have Reynaud's phenomenon with hands - he was on meloxicam in past with some relief, request refill - Used to see [facility] but no longer - Known osteoarthritis in multiple joints, no recent hip or back x-rays - Denies any fevers/chills, redness swelling, fall trauma injury, weakness, saddle anesthesia, bowel or bladder  incontinence  Hyperlipidemia / Family History of Heart Disease / MI Last results 2018, out for >1 year on medicine, not currently taking Rosuvastatin 5mg  nightly, it did not bother him or cause side effects. He would like to resume med, he is due for upcoming blood draw - Reports no personal cardiac history but has family history both parents passed from MI ages 3 father and 61 mother, other sibling has heart problem. - taking ASA 81mg  - Takes MVI daily - Diet balanced, not following particular diet   Denies any high risk travel to areas of current concern for COVID19. Denies any known or suspected exposure to person with or possibly with COVID19.  Denies any fevers, chills, sweats, body ache, cough, shortness of breath, sinus pain or pressure, headache, abdominal pain, diarrhea  Past Medical History:  Diagnosis Date  . Arthritis    Social History   Tobacco Use  . Smoking status: Former Smoker    Packs/day: 1.50    Years: 30.00    Pack years: 45.00    Types: Cigarettes    Quit date: 2007    Years since quitting: 13.9  . Smokeless tobacco: Former Systems developer    Types: Chew    Quit date: 2007  . Tobacco comment: Dip smokeless tobacco >20-30 years  Substance Use Topics  . Alcohol use: Yes  . Drug use: No    Current Outpatient Medications:  .  aspirin EC 81 MG tablet, Take 1 tablet (81 mg total) by mouth daily., Disp: , Rfl:  .  Multiple Vitamin (MULTIVITAMIN) tablet, Take 1 tablet by mouth daily., Disp: , Rfl:  .  diclofenac Sodium (VOLTAREN) 1 % GEL, Apply 2  g topically 3 (three) times daily as needed., Disp: 100 g, Rfl: 2 .  meloxicam (MOBIC) 15 MG tablet, Take 1 tablet (15 mg total) by mouth daily as needed for pain. For up to 1-2 weeks for flares., Disp: 30 tablet, Rfl: 2 .  rosuvastatin (CRESTOR) 5 MG tablet, Take 1 tablet (5 mg total) by mouth at bedtime., Disp: 90 tablet, Rfl: 3  Depression screen [facility] 2/9 07/19/2019 05/07/2017  Decreased Interest 0 0  Down, Depressed,  Hopeless 0 0  PHQ - 2 Score 0 0    No flowsheet data found.  -------------------------------------------------------------------------- O: No physical exam performed due to remote telephone encounter.  Lab results reviewed.  No results found for this or any previous visit (from the past 2160 hour(s)).  -------------------------------------------------------------------------- A&P:  Problem List Items Addressed This Visit    Osteoarthritis of multiple joints - Primary   Relevant Medications   diclofenac Sodium (VOLTAREN) 1 % GEL   meloxicam (MOBIC) 15 MG tablet   Hypercholesterolemia   Relevant Medications   rosuvastatin (CRESTOR) 5 MG tablet   Chronic bilateral low back pain with bilateral sciatica   Relevant Medications   meloxicam (MOBIC) 15 MG tablet    Other Visit Diagnoses    Bilateral hand pain       Relevant Medications   diclofenac Sodium (VOLTAREN) 1 % GEL   Stiffness of joint         #Hyperlipidemia Off med >2 years Has high cholesterol and fam history CAD/HLD Restart Rosuvastatin 5mg  nightly - new rx sent, seems this was not causing side effect or myalgia for him, will reorder now, precautions reviewed.  #Multiple joint pain stiffness / osteoarthritis Chronic osteoarthritis OA DJD multiple joints and has variety of symptoms relating to joint issues, cannot rule out carpal tunnel or peripheral nerve impingement with some numbness in fingertips, also has some possible reynaud's phenomenon as well - Fam history arthritis, uncertain if any possible rheumatoid arthritis given his constellation of symptoms  Re order Meloxicam 15mg  daily PRN, precautions reviewed New start topical Diclofenac NSAID for hands/ small joints PRN use to limit oral NSAID Tylenol PRN Use wrist splint at night to limit nerve impingement Notify us if not improved we can consider lab testing for RA or referral to ortho vs Rheum   Meds ordered this encounter  Medications  . diclofenac  Sodium (VOLTAREN) 1 % GEL    Sig: Apply 2 g topically 3 (three) times daily as needed.    Dispense:  100 g    Refill:  2  . rosuvastatin (CRESTOR) 5 MG tablet    Sig: Take 1 tablet (5 mg total) by mouth at bedtime.    Dispense:  90 tablet    Refill:  3  . meloxicam (MOBIC) 15 MG tablet    Sig: Take 1 tablet (15 mg total) by mouth daily as needed for pain. For up to 1-2 weeks for flares.    Dispense:  30 tablet    Refill:  2    Follow-up: - Return in 3 months for Annual Phys + Labs (order after apt)  Patient verbalizes understanding with the above medical recommendations including the limitation of remote medical advice.  Specific follow-up and call-back criteria were given for patient to follow-up or seek medical care more urgently if needed.   - Time spent in direct consultation with patient on phone: 14 minutes  Nobie Putnam, [facility] 07/19/2019, 4:29 PM

## 2019-08-31 ENCOUNTER — Telehealth: Payer: Self-pay | Admitting: Family Medicine

## 2019-08-31 DIAGNOSIS — R7309 Other abnormal glucose: Secondary | ICD-10-CM

## 2019-08-31 DIAGNOSIS — Z0000 Encounter for general adult medical examination without abnormal findings: Secondary | ICD-10-CM

## 2019-08-31 DIAGNOSIS — E7800 Pure hypercholesterolemia, unspecified: Secondary | ICD-10-CM

## 2019-08-31 DIAGNOSIS — Z125 Encounter for screening for malignant neoplasm of prostate: Secondary | ICD-10-CM

## 2019-08-31 DIAGNOSIS — M159 Polyosteoarthritis, unspecified: Secondary | ICD-10-CM

## 2019-08-31 NOTE — Telephone Encounter (Signed)
Signed lab orders  Nobie Putnam, [facility] 08/31/2019, 1:18 PM

## 2019-09-02 ENCOUNTER — Other Ambulatory Visit: Payer: PPO

## 2019-09-02 ENCOUNTER — Other Ambulatory Visit: Payer: Self-pay

## 2019-09-02 DIAGNOSIS — R7309 Other abnormal glucose: Secondary | ICD-10-CM

## 2019-09-02 DIAGNOSIS — Z0000 Encounter for general adult medical examination without abnormal findings: Secondary | ICD-10-CM | POA: Diagnosis not present

## 2019-09-02 DIAGNOSIS — E7800 Pure hypercholesterolemia, unspecified: Secondary | ICD-10-CM

## 2019-09-02 DIAGNOSIS — M8949 Other hypertrophic osteoarthropathy, multiple sites: Secondary | ICD-10-CM | POA: Diagnosis not present

## 2019-09-02 DIAGNOSIS — Z125 Encounter for screening for malignant neoplasm of prostate: Secondary | ICD-10-CM

## 2019-09-02 DIAGNOSIS — M159 Polyosteoarthritis, unspecified: Secondary | ICD-10-CM

## 2019-09-03 LAB — LIPID PANEL
Cholesterol: 210 mg/dL — ABNORMAL HIGH (ref <200)
HDL: 51 mg/dL (ref >=40)
LDL Cholesterol (Calc): 134 mg/dL (calc) — ABNORMAL HIGH
Non-HDL Cholesterol (Calc): 159 mg/dL (calc) — ABNORMAL HIGH (ref <130)
Total CHOL/HDL Ratio: 4.1 (calc) (ref <5.0)
Triglycerides: 139 mg/dL (ref <150)

## 2019-09-03 LAB — COMPLETE METABOLIC PANEL WITH GFR
AG Ratio: 1.2 (calc) (ref 1.0–2.5)
ALT: 10 U/L (ref 9–46)
AST: 17 U/L (ref 10–35)
Albumin: 3.8 g/dL (ref 3.6–5.1)
Alkaline phosphatase (APISO): 113 U/L (ref 35–144)
BUN: 18 mg/dL (ref 7–25)
CO2: 24 mmol/L (ref 20–32)
Calcium: 9.5 mg/dL (ref 8.6–10.3)
Chloride: 104 mmol/L (ref 98–110)
Creat: 0.92 mg/dL (ref 0.70–1.25)
GFR, Est African American: 101 mL/min/{1.73_m2} (ref >=60)
GFR, Est Non African American: 87 mL/min/{1.73_m2} (ref >=60)
Globulin: 3.3 g/dL (calc) (ref 1.9–3.7)
Glucose, Bld: 101 mg/dL (ref 65–139)
Potassium: 4.8 mmol/L (ref 3.5–5.3)
Sodium: 137 mmol/L (ref 135–146)
Total Bilirubin: 0.4 mg/dL (ref 0.2–1.2)
Total Protein: 7.1 g/dL (ref 6.1–8.1)

## 2019-09-03 LAB — CBC WITH DIFFERENTIAL/PLATELET
Absolute Monocytes: 905 cells/uL (ref 200–950)
Basophils Absolute: 65 cells/uL (ref 0–200)
Basophils Relative: 0.6 %
Eosinophils Absolute: 283 cells/uL (ref 15–500)
Eosinophils Relative: 2.6 %
HCT: 48.5 % (ref 38.5–50.0)
Hemoglobin: 16.5 g/dL (ref 13.2–17.1)
Lymphs Abs: 3717 cells/uL (ref 850–3900)
MCH: 31.8 pg (ref 27.0–33.0)
MCHC: 34 g/dL (ref 32.0–36.0)
MCV: 93.4 fL (ref 80.0–100.0)
MPV: 9.6 fL (ref 7.5–12.5)
Monocytes Relative: 8.3 %
Neutro Abs: 5930 cells/uL (ref 1500–7800)
Neutrophils Relative %: 54.4 %
Platelets: 373 10*3/uL (ref 140–400)
RBC: 5.19 10*6/uL (ref 4.20–5.80)
RDW: 11.9 % (ref 11.0–15.0)
Total Lymphocyte: 34.1 %
WBC: 10.9 10*3/uL — ABNORMAL HIGH (ref 3.8–10.8)

## 2019-09-03 LAB — PSA: PSA: 0.9 ng/mL (ref <=4.0)

## 2019-09-03 LAB — HEMOGLOBIN A1C
Hgb A1c MFr Bld: 5.7 % of total Hgb — ABNORMAL HIGH (ref <5.7)
Mean Plasma Glucose: 117 (calc)
eAG (mmol/L): 6.5 (calc)

## 2019-09-08 ENCOUNTER — Encounter: Payer: Self-pay | Admitting: Family Medicine

## 2019-09-08 ENCOUNTER — Ambulatory Visit
Admission: RE | Admit: 2019-09-08 | Discharge: 2019-09-08 | Disposition: A | Discharge status: Home / Self Care | Payer: PPO | Source: Ambulatory Visit | Attending: Family Medicine | Admitting: Family Medicine

## 2019-09-08 ENCOUNTER — Other Ambulatory Visit: Payer: Self-pay

## 2019-09-08 ENCOUNTER — Ambulatory Visit (INDEPENDENT_AMBULATORY_CARE_PROVIDER_SITE_OTHER): Payer: PPO | Admitting: Family Medicine

## 2019-09-08 VITALS — BP 121/84 | HR 69 | Temp 98.2°F | Resp 16 | Ht 71.0 in | Wt 176.0 lb

## 2019-09-08 DIAGNOSIS — R0600 Dyspnea, unspecified: Secondary | ICD-10-CM | POA: Diagnosis not present

## 2019-09-08 DIAGNOSIS — M8949 Other hypertrophic osteoarthropathy, multiple sites: Secondary | ICD-10-CM | POA: Diagnosis not present

## 2019-09-08 DIAGNOSIS — J8410 Pulmonary fibrosis, unspecified: Secondary | ICD-10-CM | POA: Diagnosis not present

## 2019-09-08 DIAGNOSIS — Z1211 Encounter for screening for malignant neoplasm of colon: Secondary | ICD-10-CM | POA: Diagnosis not present

## 2019-09-08 DIAGNOSIS — J449 Chronic obstructive pulmonary disease, unspecified: Secondary | ICD-10-CM

## 2019-09-08 DIAGNOSIS — T466X5A Adverse effect of antihyperlipidemic and antiarteriosclerotic drugs, initial encounter: Secondary | ICD-10-CM | POA: Insufficient documentation

## 2019-09-08 DIAGNOSIS — R911 Solitary pulmonary nodule: Secondary | ICD-10-CM | POA: Diagnosis not present

## 2019-09-08 DIAGNOSIS — R0609 Other forms of dyspnea: Secondary | ICD-10-CM

## 2019-09-08 DIAGNOSIS — Z23 Encounter for immunization: Secondary | ICD-10-CM | POA: Diagnosis not present

## 2019-09-08 DIAGNOSIS — J84112 Idiopathic pulmonary fibrosis: Secondary | ICD-10-CM | POA: Insufficient documentation

## 2019-09-08 DIAGNOSIS — Z0000 Encounter for general adult medical examination without abnormal findings: Secondary | ICD-10-CM

## 2019-09-08 DIAGNOSIS — M150 Primary generalized (osteo)arthritis: Secondary | ICD-10-CM

## 2019-09-08 DIAGNOSIS — E7800 Pure hypercholesterolemia, unspecified: Secondary | ICD-10-CM | POA: Diagnosis not present

## 2019-09-08 DIAGNOSIS — M791 Myalgia, unspecified site: Secondary | ICD-10-CM

## 2019-09-08 DIAGNOSIS — M159 Polyosteoarthritis, unspecified: Secondary | ICD-10-CM

## 2019-09-08 MED ORDER — GABAPENTIN 100 MG PO CAPS: ORAL_CAPSULE | ORAL | 3 refills | Status: DC

## 2019-09-08 MED ORDER — ALBUTEROL SULFATE HFA 108 (90 BASE) MCG/ACT IN AERS: 2.0000 | INHALATION_SPRAY | RESPIRATORY_TRACT | 2 refills | Status: DC | PRN

## 2019-09-08 MED ORDER — BREZTRI AEROSPHERE 160-9-4.8 MCG/ACT IN AERO: 2.0000 | INHALATION_SPRAY | Freq: Two times a day (BID) | RESPIRATORY_TRACT | 0 refills | Status: DC

## 2019-09-08 MED ORDER — NAPROXEN 500 MG PO TABS: 500.0000 mg | ORAL_TABLET | Freq: Two times a day (BID) | ORAL | 2 refills | Status: DC

## 2019-09-08 NOTE — Progress Notes (Signed)
Subjective:    Patient ID: Lawrence Wells, male    DOB: 1956/07/08, 64 y.o.   MRN: [id number]  Lawrence Wells is a 64 y.o. male presenting on 09/08/2019 for Annual Exam   HPI   Here for Annual Physical and Lab Review.  LIFESTYLE / Elevated A1c / HYPERLIPIDEMIA: - Reports concerns statin myalgia on rosuvastatin 5mg  nightly had aches and pains worse, Stopped med few month ago. - Last lipid panel 08/2019, improved LDL 130s but otherwise normal Lifestyle - Diet: goal to improve, he doesn't drink many sodas, mostly water some tea, not following low carb diet - Exercise: limited lately Taking Aspirin 81mg   Additional complaints today  Osteoarthritis multiple joint pain Reports chronic problem, gradual worsening, prior x-rays showed arthritis. He has symptoms in hands, knees, back other joints. Using topical diclofenac occasional PRN some relief. Using Aleve /Ibuprofen PRN and Tylenol, Meloxicam rx 15mg  daily PRN, sometimes he has had to take 2. - He admits stiffness and sore and eventually if active will improve, and can stiffen up again at times - No new fall or injury or trauma  Worsening Dyspnea - Former smoker, see below >30 years at 1.5 ppd approx, quit 2007 - Prior history of X-rays in 2018 for lumbar spine that showed some diffuse lung abnormality previously but only small portion of lungs examined. No other x-rays on phone that I was able to find. - He reports gradual worsening dyspnea and short of breath with exertion, over past few months more noticeable. He has some occasional cough but no significant productive cough or wheezing. - he is requesting a chest x-ray today - Denies unintentional weight loss, hemoptysis, fever chills night sweats  Health Maintenance:  Colon CA Screening: Never had colonoscopy or any colon cancer screening. Currently asymptomatic. No known family history of colon CA. Due for screening test new referral to GI for colonoscopy   Prostate CA  Screening; PSA 0.9 (08/2019) negative.  Due for initial pneumonia vaccine at age 6 - will receive Prevnar-13 today    Depression screen [facility] 2/9 09/08/2019 07/19/2019 05/07/2017  Decreased Interest 0 0 0  Down, Depressed, Hopeless 0 0 0  PHQ - 2 Score 0 0 0    Past Medical History:  Diagnosis Date  . Arthritis    Past Surgical History:  Procedure Laterality Date  . SINUS SURGERY WITH INSTATRAK     Social History   Socioeconomic History  . Marital status: Married    Spouse name: Luxton Salonen  . Number of children: Not on file  . Years of education: [employer]  . Highest education level: Not on file  Occupational History  . 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 Smoking status: Former Smoker    Packs/day: 1.50    Years: 30.00    Pack years: 45.00    Types: Cigarettes    Quit date: 2007    Years since quitting: 14.0  . Smokeless tobacco: Former Systems developer    Types: Chew    Quit date: 2007  . Tobacco comment: Dip smokeless tobacco >20-30 years  Substance and Sexual Activity  . Alcohol use: Yes  . Drug use: No  . Sexual activity: Not on file  Other Topics Concern  . Not on file  Social History Narrative  . Not on file   Social Determinants of Health   Financial Resource Strain:   . Difficulty of Paying Living Expenses: Not on  file  Food Insecurity:   . Worried About Charity fundraiser in the Last Year: Not on file  . Ran Out of Food in the Last Year: Not on file  Transportation Needs:   . Lack of Transportation (Medical): Not on file  . Lack of Transportation (Non-Medical): Not on file  Physical Activity:   . Days of Exercise per Week: Not on file  . Minutes of Exercise per Session: Not on file  Stress:   . Feeling of Stress : Not on file  Social Connections:   . Frequency of Communication with Friends and Family: Not on file  . Frequency of Social Gatherings with Friends and Family: Not on file  . Attends Religious  Services: Not on file  . Active Member of Clubs or Organizations: Not on file  . Attends Archivist Meetings: Not on file  . Marital Status: Not on file  Intimate Partner Violence:   . Fear of Current or Ex-Partner: Not on file  . Emotionally Abused: Not on file  . Physically Abused: Not on file  . Sexually Abused: Not on file   Family History  Problem Relation Age of Onset  . Heart disease Mother   . Heart attack Mother 60  . Heart disease Father   . Heart attack Father 82  . Hyperlipidemia Brother   . Throat cancer Brother 85   Current Outpatient Medications on File Prior to Visit  Medication Sig  . aspirin EC 81 MG tablet Take 1 tablet (81 mg total) by mouth daily.  . diclofenac Sodium (VOLTAREN) 1 % GEL Apply 2 g topically 3 (three) times daily as needed.  . Multiple Vitamin (MULTIVITAMIN) tablet Take 1 tablet by mouth daily.  . rosuvastatin (CRESTOR) 5 MG tablet Take 1 tablet (5 mg total) by mouth once a week.   No current facility-administered medications on file prior to visit.    Review of Systems Per HPI unless specifically indicated above      Objective:    BP 121/84   Pulse 69   Temp 98.2 F (36.8 C) (Oral)   Resp 16   Ht 5\' 11"  (1.803 m)   Wt 176 lb (79.8 kg)   SpO2 96%   BMI 24.55 kg/m   Wt Readings from Last 3 Encounters:  09/08/19 176 lb (79.8 kg)  06/09/17 186 lb (84.4 kg)  05/07/17 185 lb 9.6 oz (84.2 kg)    Physical Exam Vitals and nursing note reviewed.  Constitutional:      General: He is not in acute distress.    Appearance: He is well-developed. He is not diaphoretic.     Comments: Well-appearing, comfortable, cooperative  HENT:     Head: Normocephalic and atraumatic.  Eyes:     General:        Right eye: No discharge.        Left eye: No discharge.     Conjunctiva/sclera: Conjunctivae normal.     Pupils: Pupils are equal, round, and reactive to light.  Neck:     Thyroid: No thyromegaly.     Comments: No carotid  bruit Cardiovascular:     Rate and Rhythm: Normal rate and regular rhythm.     Heart sounds: Normal heart sounds. No murmur.  Pulmonary:     Effort: Pulmonary effort is normal. No respiratory distress.     Breath sounds: No wheezing or rales.     Comments: Reduced air movement diffuse slightly worse at bases, some coarse breath  sounds inc at bases. No focal abnormality. Abdominal:     General: Bowel sounds are normal. There is no distension.     Palpations: Abdomen is soft. There is no mass.     Tenderness: There is no abdominal tenderness.  Musculoskeletal:        General: No tenderness. Normal range of motion.     Cervical back: Normal range of motion and neck supple.     Comments: Upper / Lower Extremities: - Normal muscle tone, strength bilateral upper extremities 5/5, lower extremities 5/5  Bilateral hands /fingers with some bulkiness appearance of MCP DIP, some stiffness on ROM  Lymphadenopathy:     Cervical: No cervical adenopathy.  Skin:    General: Skin is warm and dry.     Findings: No erythema or rash.  Neurological:     Mental Status: He is alert and oriented to person, place, and time.     Comments: Distal sensation intact to light touch all extremities  Psychiatric:        Behavior: Behavior normal.     Comments: Well groomed, good eye contact, normal speech and thoughts      NEW X-ray TODAY 09/08/19  I have personally reviewed the radiology report from 09/08/19 on STAT Chest X-ray.  CLINICAL DATA:  Worsening dyspnea  EXAM: CHEST - 2 VIEW  COMPARISON:  06/09/2017  FINDINGS: Cardiac shadow is at the upper limits of normal in size. The lungs are well aerated bilaterally. Diffuse fibrotic changes are seen. No sizable infiltrate or focal effusion is seen. There is a questionable nodular density in the left apex. This may simply be related to confluence of fibrotic change. No bony abnormality is noted.  IMPRESSION: Diffuse fibrotic changes with  questionable nodule in the left apex. Noncontrast CT is recommended for further evaluation.   Electronically Signed   By: Inez Catalina M.D.   On: 09/08/2019 10:07   ------------------------------------------------------   OLD X-RAY IMAGES I have personally reviewed the radiology report from 06/09/17 X-ray Lumbar / Hip .  CLINICAL DATA:  64 year old male with chronic bilateral hip pain worse on the right. Symptoms progressing over the past 3 weeks. Pain extends down back legs no injury. Initial encounter.  EXAM: LUMBAR SPINE - COMPLETE 4+ VIEW  COMPARISON:  None.  FINDINGS: Minimal curvature convex right.  Mild L4-5 and mild to moderate L5-S1 disc space narrowing.  Mild degenerative changes T12-L1.  Significant lung disease incompletely assessed on present exam.  Vascular calcifications.  IMPRESSION: Mild L4-5 and mild to moderate L5-S1 disc space narrowing.  Mild degenerative changes T12-L1.  Significant lung disease incompletely assessed on present exam.  Aortic Atherosclerosis (ICD10-I70.0).   Electronically Signed   By: Genia Del M.D.   On: 06/09/2017 18:21 ------------------------------------------------   CLINICAL DATA:  64 year old male with chronic bilateral hip pain worse on the right. Symptoms progressing over the past 3 weeks. Pain extends down back legs no injury. Initial encounter.  EXAM: DG HIP (WITH OR WITHOUT PELVIS) 2-3V RIGHT  COMPARISON:  Left hip films same date dictated separately.  FINDINGS: Minimal right hip joint degenerative changes. No plain film evidence of right femoral head avascular necrosis.  No fracture or dislocation.  IMPRESSION: Minimal right hip joint degenerative changes.   Electronically Signed   By: Genia Del M.D.   On: 06/09/2017 18:25  Results for orders placed or performed in visit on 09/02/19  physical Lipid panel  Result Value Ref Range   Cholesterol 210 (H) <200 mg/dL  HDL 51 > OR = 40 mg/dL   Triglycerides 139 <150 mg/dL   LDL Cholesterol (Calc) 134 (H) mg/dL (calc)   Total CHOL/HDL Ratio 4.1 <5.0 (calc)   Non-HDL Cholesterol (Calc) 159 (H) <130 mg/dL (calc)  physical PSA  Result Value Ref Range   PSA 0.9 < OR = 4.0 ng/mL  physical HgB A1c  Result Value Ref Range   Hgb A1c MFr Bld 5.7 (H) <5.7 % of total Hgb   Mean Plasma Glucose 117 (calc)   eAG (mmol/L) 6.5 (calc)  physical CBC  Result Value Ref Range   WBC 10.9 (H) 3.8 - 10.8 Thousand/uL   RBC 5.19 4.20 - 5.80 Million/uL   Hemoglobin 16.5 13.2 - 17.1 g/dL   HCT 48.5 38.5 - 50.0 %   MCV 93.4 80.0 - 100.0 fL   MCH 31.8 27.0 - 33.0 pg   MCHC 34.0 32.0 - 36.0 g/dL   RDW 11.9 11.0 - 15.0 %   Platelets 373 140 - 400 Thousand/uL   MPV 9.6 7.5 - 12.5 fL   Neutro Abs 5,930 1,500 - 7,800 cells/uL   Lymphs Abs 3,717 850 - 3,900 cells/uL   Absolute Monocytes 905 200 - 950 cells/uL   Eosinophils Absolute 283 15 - 500 cells/uL   Basophils Absolute 65 0 - 200 cells/uL   Neutrophils Relative % 54.4 %   Total Lymphocyte 34.1 %   Monocytes Relative 8.3 %   Eosinophils Relative 2.6 %   Basophils Relative 0.6 %  cmet with GFR physical  Result Value Ref Range   Glucose, Bld 101 65 - 139 mg/dL   BUN 18 7 - 25 mg/dL   Creat 0.92 0.70 - 1.25 mg/dL   GFR, Est Non African American 87 > OR = 60 mL/min/1.25m2   GFR, Est African American 101 > OR = 60 mL/min/1.17m2   BUN/Creatinine Ratio NOT APPLICABLE 6 - 22 (calc)   Sodium 137 135 - 146 mmol/L   Potassium 4.8 3.5 - 5.3 mmol/L   Chloride 104 98 - 110 mmol/L   CO2 24 20 - 32 mmol/L   Calcium 9.5 8.6 - 10.3 mg/dL   Total Protein 7.1 6.1 - 8.1 g/dL   Albumin 3.8 3.6 - 5.1 g/dL   Globulin 3.3 1.9 - 3.7 g/dL (calc)   AG Ratio 1.2 1.0 - 2.5 (calc)   Total Bilirubin 0.4 0.2 - 1.2 mg/dL   Alkaline phosphatase (APISO) 113 35 - 144 U/L   AST 17 10 - 35 U/L   ALT 10 9 - 46 U/L      Assessment & Plan:   Problem List Items Addressed This Visit     Pulmonary fibrosis (HCC)   Relevant Medications   albuterol (VENTOLIN HFA) 108 (90 Base) MCG/ACT inhaler   Other Relevant Orders   DG Chest 2 View (Completed)   Osteoarthritis of multiple joints    Gradual progression vs somewhat stable chronic issue Multiple joints affected Continue topical diclofenac 1-3 times daily PRN small joints DC Meloxicam, switch to Naproxen 500 BID PRN, new rx Use Tylenol higher dose 1g TID PRN Add Gabapentin 100mg  titration nightly then to TID if need or 300 QHS, as additional pain relief for arthritis      Relevant Medications   naproxen (NAPROSYN) 500 MG tablet   gabapentin (NEURONTIN) 100 MG capsule   Myalgia due to statin   Hypercholesterolemia    Elevated LDL >130 on last lipid, off statin Last lipid panel 08/2019 Calculated ASCVD 10 yr risk  score elevated  Plan: 1. REDUCE dose Rosuvastatin frequency now to WEEKLY 5mg  one pill, can gradual titrate up to 2-3 times a week as tolerated 2. Continue ASA 81mg  for primary ASCVD risk reduction 3. Encourage improved lifestyle - low carb/cholesterol, reduce portion size, continue improving  regular exercise      Relevant Medications   rosuvastatin (CRESTOR) 5 MG tablet    Other Visit Diagnoses    Annual physical exam    -  Primary   Dyspnea on exertion       Relevant Orders   DG Chest 2 View (Completed)   Need for vaccination with 13-polyvalent pneumococcal conjugate vaccine       Relevant Orders   Pneumococcal conjugate vaccine 13-valent IM (Completed)   Screen for colon cancer       Relevant Orders   Ambulatory referral to Gastroenterology     Updated Health Maintenance information - Referral to [facility] for Colonoscopy initial screening - Prevnar13 today, next dose pneumovax23 in 1 year Reviewed recent lab results with patient Encouraged improvement to lifestyle with diet and exercise - Goal of weight loss   Orders Placed This Encounter  Procedures  . DG Chest 2 View     Standing Status:   Future    Number of Occurrences:   1    Standing Expiration Date:   11/05/2020    Order Specific Question:   Reason for Exam (SYMPTOM  OR DIAGNOSIS REQUIRED)    Answer:   worsening dyspnea, advanced chronic lung disease on prior lumbar x-ray 2018, former smoker, suspect COPD    Order Specific Question:   Preferred imaging location?    Answer:   [facility]    Order Specific Question:   Radiology Contrast Protocol - do NOT remove file path    Answer:   [email or web address]  . Pneumococcal conjugate vaccine 13-valent IM  . Ambulatory referral to Gastroenterology    Referral Priority:   Routine    Referral Type:   Consultation    Referral Reason:   Specialty Services Required    Number of Visits Requested:   1    # Dyspnea on Exertion / Pulmonary Fibrosis - new diagnosis Concerning history of gradual progression dyspnea now few months, but prior records reveal some diffuse lung disease seen in past x-rays not focusing on lungs. He was aware of this but did not affect him at the time.  No sign of infection or volume overload or other clear cause. Suspect some form of COPD emphysema given long tobacco abuse history and gradual onset  Proceed with STAT CXR today - Given sample x 2 inhaler of Breztri 2 puff BID as triple therapy option given symptoms - he can check cost coverage if need new rx or switch med - rx Albuterol PRN rescue inhaler only as needed  **Update after 1215pm today 09/08/19**  Reviewed chest X-ray result, called patient. Advised him that pulmonary fibrosis seems to have advanced and is diffuse also L upper lobe possible lung nodule, at this time seems uncertain cause, we will proceed with recommended referral/imaging, radiology rec Non Contrast CT Lung - I called Burgess Estelle RN and discussed this case and he agrees case is appropriate for referral to [facility] they will watch for this order and expedite a  follow-up plan with placing this initial CT imaging and discuss results and then also we agreed to proceed with referral to Pulmonology, sent to [place] to expedite process after his CT is  done to follow-up on further management.  Orders Placed This Encounter  Procedures  .                                                .   .                       . Ambulatory referral to Pulmonology    Referral Priority:   Routine    Referral Type:   Consultation    Referral Reason:   Specialty Services Required    Requested Specialty:   Pulmonary Disease    Number of Visits Requested:   1  . AMB  Referral to Pul[facility]    Referral Priority:   Routine    Referral Type:   Consultation    Referral Reason:   Specialty Services Required    Number of Visits Requested:   1      Meds ordered this encounter  Medications  . naproxen (NAPROSYN) 500 MG tablet    Sig: Take 1 tablet (500 mg total) by mouth 2 (two) times daily with a meal. For 2-4 weeks then as needed    Dispense:  60 tablet    Refill:  2  . gabapentin (NEURONTIN) 100 MG capsule    Sig: Start 1 capsule daily, increase by 1 cap every 2-3 days as tolerated up to 3 times a day, or may take 3 at once in evening.    Dispense:  90 capsule    Refill:  3  . albuterol (VENTOLIN HFA) 108 (90 Base) MCG/ACT inhaler    Sig: Inhale 2 puffs into the lungs every 4 (four) hours as needed for wheezing or shortness of breath (cough).    Dispense:  8 g    Refill:  2      Follow up plan: Return in about 6 months (around 03/07/2020) for 6 month COPD / Arthritis.   Nobie Putnam, [facility] 09/08/2019, 9:11 AM

## 2019-09-08 NOTE — Patient Instructions (Addendum)
Thank you for coming to the office today.  Lab results look good overall. Slightly elevated cholesterol still, but it is improved. Reduce Rosuvastatin 5mg  - to now only ONCE A WEEK at bedtime, if you do very well with that, can increase to TWICE a week. Maximum dose is THREE times a week.  Stop Meloxicam.  Now use Cream as often as you need.  When needed: Recommend trial of Anti-inflammatory with Naproxen (Naprosyn) 500mg  tabs - take one with food and plenty of water TWICE daily every day (breakfast and dinner), for next 2 to 4 weeks, then you may take only as needed - DO NOT TAKE any ibuprofen, advil, meloxicam, aleve, motrin while you are taking this medicine - It is safe to take Tylenol Ext Str 500mg  tabs - take 1 to 2 (max dose 1000mg ) every 6 hours as needed for breakthrough pain, max 24 hour daily dose is 6 to 8 tablets or 4000mg    Take every day  Start Gabapentin 100mg  capsules, take at night for 2-3 nights only, and then increase to 2 times a day for a few days, and then may increase to 3 times a day, it may make you drowsy, if helps significantly at night only, then you can increase instead to 3 capsules at night, instead of 3 times a day - In the future if needed, we can significantly increase the dose if tolerated well, some common doses are 300mg  three times a day up to 600mg  three times a day, usually it takes several weeks or months to get to higher doses  -----------------------------  STAT X-ray of Chest Lungs today. Will follow up with that, most likely some advanced lung disease such as COPD or Emphysema causing short of breath.  Sample inhaler, check cost and coverage of the following  Breztri Symbiort Breo Anoro Trelegy  REFERRAL to Colonoscopy / GI doctors in [place] - stay tuned for apt.  1st dose Pneumonia vaccine Prevnar13 today, next due in 1 year. Then done  Please schedule a Follow-up Appointment to: Return in about 6 months (around 03/07/2020) for 6  month COPD / Arthritis.  If you have any other questions or concerns, please feel free to call the office or send a message through [place]. You may also schedule an earlier appointment if necessary.  Additionally, you may be receiving a survey about your experience at our office within a few days to 1 week by e-mail or mail. We value your feedback.  Nobie Putnam, DO [place]

## 2019-09-08 NOTE — Assessment & Plan Note (Signed)
Elevated LDL >130 on last lipid, off statin Last lipid panel 08/2019 Calculated ASCVD 10 yr risk score elevated  Plan: 1. REDUCE dose Rosuvastatin frequency now to WEEKLY 5mg  one pill, can gradual titrate up to 2-3 times a week as tolerated 2. Continue ASA 81mg  for primary ASCVD risk reduction 3. Encourage improved lifestyle - low carb/cholesterol, reduce portion size, continue improving  regular exercise

## 2019-09-08 NOTE — Assessment & Plan Note (Signed)
Gradual progression vs somewhat stable chronic issue Multiple joints affected Continue topical diclofenac 1-3 times daily PRN small joints DC Meloxicam, switch to Naproxen 500 BID PRN, new rx Use Tylenol higher dose 1g TID PRN Add Gabapentin 100mg  titration nightly then to TID if need or 300 QHS, as additional pain relief for arthritis

## 2019-09-09 ENCOUNTER — Other Ambulatory Visit: Payer: Self-pay

## 2019-09-09 ENCOUNTER — Telehealth: Payer: Self-pay

## 2019-09-09 DIAGNOSIS — Z1211 Encounter for screening for malignant neoplasm of colon: Secondary | ICD-10-CM

## 2019-09-09 MED ORDER — NA SULFATE-K SULFATE-MG SULF 17.5-3.13-1.6 GM/177ML PO SOLN: 354.0000 mL | Freq: Once | ORAL | 0 refills | Status: AC

## 2019-09-09 NOTE — Telephone Encounter (Signed)
Gastroenterology Pre-Procedure Review   PATIENT REVIEW QUESTIONS: The patient responded to the following health history questions as indicated:    1. Are you having any GI issues? no 2. Do you have a personal history of Polyps? no 3. Do you have a family history of Colon Cancer or Polyps? no 4. Diabetes Mellitus? no 5. Joint replacements in the past 12 months?no 6. Major health problems in the past 3 months?no 7. Any artificial heart valves, MVP, or defibrillator?no    MEDICATIONS & ALLERGIES:    Patient reports the following regarding taking any anticoagulation/antiplatelet therapy:   Plavix, Coumadin, Eliquis, Xarelto, Lovenox, Pradaxa, Brilinta, or Effient? no Aspirin? Yes Asprin 81mg    Patient confirms/reports the following medications:  Current Outpatient Medications  Medication Sig Dispense Refill  . albuterol (VENTOLIN HFA) 108 (90 Base) MCG/ACT inhaler Inhale 2 puffs into the lungs every 4 (four) hours as needed for wheezing or shortness of breath (cough). 8 g 2  . aspirin EC 81 MG tablet Take 1 tablet (81 mg total) by mouth daily.    . Budeson-Glycopyrrol-Formoterol (BREZTRI AEROSPHERE) 160-9-4.8 MCG/ACT AERO Inhale 2 puffs into the lungs 2 (two) times daily. 5.9 g 0  . diclofenac Sodium (VOLTAREN) 1 % GEL Apply 2 g topically 3 (three) times daily as needed. 100 g 2  . gabapentin (NEURONTIN) 100 MG capsule Start 1 capsule daily, increase by 1 cap every 2-3 days as tolerated up to 3 times a day, or may take 3 at once in evening. 90 capsule 3  . Multiple Vitamin (MULTIVITAMIN) tablet Take 1 tablet by mouth daily.    . naproxen (NAPROSYN) 500 MG tablet Take 1 tablet (500 mg total) by mouth 2 (two) times daily with a meal. For 2-4 weeks then as needed 60 tablet 2  . rosuvastatin (CRESTOR) 5 MG tablet Take 1 tablet (5 mg total) by mouth once a week. 12 tablet 3   No current facility-administered medications for this visit.    Patient confirms/reports the following allergies:  No  Known Allergies  No orders of the defined types were placed in this encounter.   AUTHORIZATION INFORMATION Primary Insurance: 1D#: Group #:  Secondary Insurance: 1D#: Group #:  SCHEDULE INFORMATION: Date: 10/11/2019 Time: Location:[place]

## 2019-09-10 ENCOUNTER — Ambulatory Visit (INDEPENDENT_AMBULATORY_CARE_PROVIDER_SITE_OTHER): Payer: PPO | Admitting: Internal Medicine

## 2019-09-10 ENCOUNTER — Encounter: Payer: Self-pay | Admitting: Internal Medicine

## 2019-09-10 ENCOUNTER — Other Ambulatory Visit: Payer: Self-pay

## 2019-09-10 ENCOUNTER — Other Ambulatory Visit
Admission: RE | Admit: 2019-09-10 | Discharge: 2019-09-10 | Disposition: A | Discharge status: Home / Self Care | Payer: PPO | Source: Ambulatory Visit | Attending: Internal Medicine | Admitting: Internal Medicine

## 2019-09-10 VITALS — BP 138/82 | HR 68 | Temp 97.2°F | Ht 71.0 in | Wt 177.0 lb

## 2019-09-10 DIAGNOSIS — J849 Interstitial pulmonary disease, unspecified: Secondary | ICD-10-CM | POA: Diagnosis not present

## 2019-09-10 DIAGNOSIS — R911 Solitary pulmonary nodule: Secondary | ICD-10-CM | POA: Diagnosis not present

## 2019-09-10 DIAGNOSIS — R05 Cough: Secondary | ICD-10-CM

## 2019-09-10 DIAGNOSIS — R0609 Other forms of dyspnea: Secondary | ICD-10-CM

## 2019-09-10 DIAGNOSIS — R053 Chronic cough: Secondary | ICD-10-CM

## 2019-09-10 DIAGNOSIS — R0600 Dyspnea, unspecified: Secondary | ICD-10-CM

## 2019-09-10 LAB — BASIC METABOLIC PANEL
Anion gap: 11 (ref 5–15)
BUN: 28 mg/dL — ABNORMAL HIGH (ref 8–23)
CO2: 23 mmol/L (ref 22–32)
Calcium: 9.2 mg/dL (ref 8.9–10.3)
Chloride: 103 mmol/L (ref 98–111)
Creatinine, Ser: 0.77 mg/dL (ref 0.61–1.24)
GFR calc Af Amer: >60 mL/min (ref >60)
GFR calc non Af Amer: >60 mL/min (ref >60)
Glucose, Bld: 91 mg/dL (ref 70–99)
Potassium: 4.2 mmol/L (ref 3.5–5.1)
Sodium: 137 mmol/L (ref 135–145)

## 2019-09-10 LAB — CBC WITH DIFFERENTIAL/PLATELET
Abs Immature Granulocytes: 0.05 10*3/uL (ref 0.00–0.07)
Basophils Absolute: 0.1 10*3/uL (ref 0.0–0.1)
Basophils Relative: 0 %
Eosinophils Absolute: 0.2 10*3/uL (ref 0.0–0.5)
Eosinophils Relative: 2 %
HCT: 49.5 % (ref 39.0–52.0)
Hemoglobin: 16.5 g/dL (ref 13.0–17.0)
Immature Granulocytes: 0 %
Lymphocytes Relative: 35 %
Lymphs Abs: 3.9 10*3/uL (ref 0.7–4.0)
MCH: 31.4 pg (ref 26.0–34.0)
MCHC: 33.3 g/dL (ref 30.0–36.0)
MCV: 94.1 fL (ref 80.0–100.0)
Monocytes Absolute: 0.9 10*3/uL (ref 0.1–1.0)
Monocytes Relative: 8 %
Neutro Abs: 6.1 10*3/uL (ref 1.7–7.7)
Neutrophils Relative %: 55 %
Platelets: 347 10*3/uL (ref 150–400)
RBC: 5.26 MIL/uL (ref 4.22–5.81)
RDW: 12.4 % (ref 11.5–15.5)
WBC: 11.2 10*3/uL — ABNORMAL HIGH (ref 4.0–10.5)
nRBC: 0 % (ref 0.0–0.2)

## 2019-09-10 LAB — SEDIMENTATION RATE: Sed Rate: 17 mm/hr (ref 0–20)

## 2019-09-10 LAB — HEPATIC FUNCTION PANEL
ALT: 16 U/L (ref 0–44)
AST: 22 U/L (ref 15–41)
Albumin: 3.8 g/dL (ref 3.5–5.0)
Alkaline Phosphatase: 112 U/L (ref 38–126)
Bilirubin, Direct: <0.1 mg/dL (ref 0.0–0.2)
Total Bilirubin: 0.9 mg/dL (ref 0.3–1.2)
Total Protein: 8.3 g/dL — ABNORMAL HIGH (ref 6.5–8.1)

## 2019-09-10 LAB — CK TOTAL AND CKMB (NOT AT ARMC)
CK, MB: 3.4 ng/mL (ref 0.5–5.0)
Relative Index: INVALID (ref 0.0–2.5)
Total CK: 97 U/L (ref 49–397)

## 2019-09-10 NOTE — Addendum Note (Signed)
Addended by: Santiago Bur on: 09/10/2019 12:39 PM   Modules accepted: Orders

## 2019-09-10 NOTE — Addendum Note (Signed)
Addended by: Santiago Bur on: 09/10/2019 12:41 PM   Modules accepted: Orders

## 2019-09-10 NOTE — Addendum Note (Signed)
Addended by: Santiago Bur on: 09/10/2019 12:38 PM   Modules accepted: Orders

## 2019-09-10 NOTE — Addendum Note (Signed)
Addended by: Santiago Bur on: 09/10/2019 12:40 PM   Modules accepted: Orders

## 2019-09-10 NOTE — Addendum Note (Signed)
Addended by: Santiago Bur on: 09/10/2019 12:42 PM   Modules accepted: Orders

## 2019-09-10 NOTE — Patient Instructions (Addendum)
ICD-10-CM   1. ILD (interstitial lung disease) ([place])  J84.9   2. Dyspnea on exertion  R06.00   3. Chronic cough  R05   4. Nodule of upper lobe of left lung  R91.1     - I am concerned you  have Interstitial Lung Disease (ILD) aka Pulmonary Fibrosis   -  There are MANY varieties of this - the variety I am suspecting is IPF based on age > 84, male gender, prior smoking, work history, acid reflux histroy, crackling noise at bottom of lung and clubbing  Plan  - Do HRCT supine and prone witn inspiratory and expiratory images - next few days to few weeks  - might need PET scan based on CT rsult  - Do blood  09/10/2019 - Serum: ESR, ACE, ANA, DS-DNA, RF, anti-CCP,  ANCA screen, MPO, PR-3, Total CK,  Aldolase,   scl-70, ssA, ssB, anti-RNP, anti-JO-1,  Hypersensitivity Pneumonitis Panel  - Do blood 09/10/2019 - Check blood Cbc with diff, LFT and BMET  - Do ONO on room air  - start 2 L [place] portable o2 for exertion -to help quality of symptoms  - Do PFT test first available  Followup - 15 min telephone visit in 1- 2 weeks but after completing results - to discuss results (MD can be in [place] to do this telephone visit)  - can be with APP in [place] as well  - do not hold up visit because of PFT test

## 2019-09-10 NOTE — Progress Notes (Signed)
OV 09/10/2019  Subjective:  Patient ID: Lawrence Wells, male , DOB: 12/20/1955 , age 64 y.o. , MRN: [id number] , ADDRESS: [place] [id number]   09/10/2019 -   Chief Complaint  Patient presents with  . pulmonary consult    per Dr. Parks Ranger- CXR 09/08/2019. pt reports of sob with exertion, talking and bending, prod cough with yellow mucus, left sided chest discomfort  and wheezing.     HPI Lawrence Wells 64 y.o. -referred for interstitial lung disease after a chest x-ray from September 08, 2019 that I personally visualized and interpreted shows diffuse ILD along with left upper lobe nodule.  Patient has wife tell me that he has had insidious onset of shortness of breath for a year with progression in the last few months.  They also tell me that few years ago he had an chest x-ray done for an incidental unrelated reason that showed chronic changes of scarring but apparently were reassured.  In review of his chart and noticed that in 2005 this is chest x-ray done in the [facility] system with reports of ILD in it.  He is unaware of that.  History for this visit is given by the patient and his wife and review of the chart. [facility] Integrated Comprehensive ILD Questionnaire  Symptoms:   Insidious onset of shortness of breath for the last year getting worse in the last 3 to 4 months.  Definitely no dyspnea a few years ago even though chest x-ray from 2005 was reported as ILD.  Sometimes dyspnea is episodic but mostly with exertion relieved by rest.  Symptom severity is listed below.  There is associated significant cough.  Cough started in October 2020.  It is getting worse cough is rated as moderate to severe.  He coughs at night.  He does bring up some yellow phlegm.  It is worse when he lies down.Marland Kitchen  He does clear his throat and he does feel a tickle in the back of the throat.  There is also associated wheezing.     SYMPTOM SCALE - ILD 09/10/2019   O2 use ra    Shortness of Breath 0 -> 5 scale with 5 being worst (score 6 If unable to do)  At rest 0  Simple tasks - showers, clothes change, eating, shaving *4  Household (dishes, doing bed, laundry) 4*  Shopping 3  Walking level at own pace 3  Walking keeping up with others of same age 64  Walking up Stairs 4  Walking up [place] 4  Total (40 - 48) Dyspnea Score 24  How bad is your cough? yes  How bad is your fatigue yes       Past Medical History : He gives a history positive for arthritis.  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  Denies any asthma or known COPD.  Denies heart failure.  Denies scleroderma any collagen vascular disease.  Denies diagnosis of acid reflux or hiatal hernia.  Denies obstructive sleep apnea.  Denies pulmonary hypertension.  Denies diabetes or thyroid disease or stroke or seizures.  Denies infectious mononucleosis.  Denies hepatitis.  Denies tuberculosis denies kidney disease.  Denies blood clots denies heart disease denies pleurisy.   ROS: Positive for fatigue and arthralgia.  Positive for acid reflux.  He went to check the box for [name].  Denies any GI symptoms or rash or ulcers  FAMILY HISTORY of LUNG DISEASE: Denies including COPD and pulmonary fibrosis   EXPOSURE HISTORY: Smokes cigarettes between 1983 and 2007 1 to 2 packs/day and then quit.  Did not smoke cigars did not smoke pipe.  Did not vape.  Never smoked marijuana or cocaine.  Never used intravenous drugs.   HOME and HOBBY DETAILS : [place].  The age of the home is 20 years.  There is no dampness in the living environment.  No mold or mildew.  Does not use humidifier.  No CPAP use.  Does not use nebulizer.  No steam iron use.  No Jacuzzi.  No pet birds or parakeets in the house.  No pet gerbils.  No feather pillows.  No mold in the  [facility] duct.  Does not play wind instruments.  Does not do any gardening.   OCCUPATIONAL HISTORY (122 questions) : Works in the Transport planner for 30 years.  Worked in [employer] and crawl spaces.  A lot of dust exposure.  During this time he worked in damp air-conditioned spaces.  He thinks he had asbestos exposure.  He also worked in the [employer].  He cleaned AC coils.  Otherwise history is negative   PULMONARY TOXICITY HISTORY (27 items): Entirely negative.      Simple office walk 185 feet x  3 laps goal with forehead probe 09/10/2019   O2 used ra  Number laps completed 3  Comments about pace mod  Resting Pulse Ox/HR 98% and 74/min  Final Pulse Ox/HR 88% and 96/min  Desaturated </= 88% yes  Desaturated <= 3% points Yes, 10 points  Got Tachycardic >/= 90/min yes  Symptoms at end of test Yes "pretty heavy"  Miscellaneous comments Corrected 2L       ROS - per HPI     has a past medical history of Arthritis.   reports that he quit smoking about 14 years ago. His smoking use included cigarettes. He has a 45.00 pack-year smoking history. He quit smokeless tobacco use about 14 years ago.  His smokeless tobacco use included chew.  Past Surgical History:  Procedure Laterality Date  . SINUS SURGERY WITH INSTATRAK      No Known Allergies  Immunization History  Administered Date(s) Administered  . Pneumococcal Conjugate-13 09/08/2019  . Tdap 04/19/2015, 05/07/2017    Family History  Problem Relation Age of Onset  . Heart disease Mother   . Heart attack Mother 66  . Heart disease Father   . Heart attack Father 29  . Hyperlipidemia Brother   . Throat cancer Brother 53     Current Outpatient Medications:  .  albuterol (VENTOLIN HFA) 108 (90 Base) MCG/ACT inhaler, Inhale 2 puffs into the lungs every 4 (four) hours as needed for wheezing or shortness of breath (cough)., Disp: 8 g, Rfl: 2 .  aspirin EC 81 MG tablet, Take 1 tablet (81 mg total) by  mouth daily., Disp: , Rfl:  .  Budeson-Glycopyrrol-Formoterol (BREZTRI AEROSPHERE) 160-9-4.8 MCG/ACT AERO, Inhale 2 puffs into the lungs 2 (two) times daily., Disp: 5.9 g, Rfl: 0 .  diclofenac Sodium (VOLTAREN) 1 % GEL, Apply 2 g topically 3 (three) times daily as needed., Disp: 100 g, Rfl: 2 .  gabapentin (NEURONTIN) 100 MG capsule, Start 1 capsule daily, increase by 1 cap every 2-3 days as tolerated up to 3 times a day, or may  take 3 at once in evening., Disp: 90 capsule, Rfl: 3 .  Multiple Vitamin (MULTIVITAMIN) tablet, Take 1 tablet by mouth daily., Disp: , Rfl:  .  naproxen (NAPROSYN) 500 MG tablet, Take 1 tablet (500 mg total) by mouth 2 (two) times daily with a meal. For 2-4 weeks then as needed, Disp: 60 tablet, Rfl: 2 .  rosuvastatin (CRESTOR) 5 MG tablet, Take 1 tablet (5 mg total) by mouth once a week., Disp: 12 tablet, Rfl: 3      Objective:   Vitals:   09/10/19 1111  BP: 138/82  Pulse: 68  Temp: (!) 97.2 F (36.2 C)  TempSrc: Temporal  SpO2: 97%  Weight: 177 lb (80.3 kg)  Height: '5\' 11"'  (1.803 m)    Estimated body mass index is 24.69 kg/m as calculated from the following:   Height as of this encounter: '5\' 11"'  (1.803 m).   Weight as of this encounter: 177 lb (80.3 kg).  '@WEIGHTCHANGE' @  [employer]   09/10/19 1111  Weight: 177 lb (80.3 kg)     Physical Exam  General Appearance:    Alert, cooperative, no distress, appears stated age - yes , Deconditioned looking - no , OBESE  - no, Sitting on Wheelchair -  n  Head:    Normocephalic, without obvious abnormality, atraumatic  Eyes:    PERRL, conjunctiva/corneas clear,  Ears:    Normal TM's and external ear canals, both ears  Nose:   Nares normal, septum midline, mucosa normal, no drainage    or sinus tenderness. OXYGEN ON  - no . Patient is @ ra   Throat:   Lips, mucosa, and tongue normal; teeth and gums normal. Cyanosis on lips - no  Neck:   Supple, symmetrical, trachea midline, no adenopathy;    thyroid:  no  enlargement/tenderness/nodules; no carotid   bruit or JVD  Back:     Symmetric, no curvature, ROM normal, no CVA tenderness  Lungs:     Distress - no , Wheeze no, Barrell Chest - no, Purse lip breathing - no, Crackles - YES - classic cranio caudal velcro at base   Chest Wall:    No tenderness or deformity.    Heart:    Regular rate and rhythm, S1 and S2 normal, no rub   or gallop, Murmur - no  Breast Exam:    NOT DONE  Abdomen:     Soft, non-tender, bowel sounds active all four quadrants,    no masses, no organomegaly. Visceral obesity - no  Genitalia:   NOT DONE  Rectal:   NOT DONE  Extremities:   Extremities - normal, Has Cane - no, Clubbing - YES, Edema - no  Pulses:   2+ and symmetric all extremities  Skin:   Stigmata of Connective Tissue Disease - NO  Lymph nodes:   Cervical, supraclavicular, and axillary nodes normal  Psychiatric:  Neurologic:   Pleasant - yes, Anxious - no, Flat affect - no  CAm-ICU - neg, Alert and Oriented x 3 - yes, Moves all 4s - yes, Speech - normal, Cognition - intact           Assessment:       ICD-10-CM   1. ILD (interstitial lung disease) ([facility])  J84.9 Sedimentation rate    Angiotensin converting enzyme    Anti-DNA antibody, double-stranded    Rheumatoid factor    Cyclic citrul peptide antibody, IgG    ANCA screen with reflex titer    Antimyeloperoxidase (MPO) Abs  Antiproteinase 3 (PR-3) Abs    CK Total (and CKMB)    Aldolase    Anti-scleroderma antibody    Sjogren's syndrome antibods(ssa + ssb)    RNP Antibodies    Jo-1 antibody-IgG    Hypersensitivity Pneumonitis    CBC w/Diff    Basic metabolic panel    Pulmonary Function Test [facility] Only    Hepatic function panel    Pulse oximetry, overnight    AMB REFERRAL FOR DME    CANCELED: Hepatic function panel  2. Dyspnea on exertion  R06.00   3. Chronic cough  R05   4. Nodule of upper lobe of left lung  R91.1   5. Interstitial pulmonary disease (HCC)  J84.9 CT Chest High Resolution    In terms of ILD seen on the chest x-ray: The story is very suspicious for idiopathic pulmonary fibrosis given his age, Caucasian ethnicity, male gender, previous smoking and occupational history possible acid reflux history and progression.  Rheumatoid arthritis or collagen vascular disease given his history of arthritis and [name] possibly is in the differential.  He might have associated emphysema on account of his previous smoking  There is a history of nodule in the left lung.  This is possibly early stage lung cancer.  This can be elucidated on a CT scan during part of the ILD work-up    Plan:     Patient Instructions     ICD-10-CM   1. ILD (interstitial lung disease) ([place])  J84.9   2. Dyspnea on exertion  R06.00   3. Chronic cough  R05   4. Nodule of upper lobe of left lung  R91.1     - I am concerned you  have Interstitial Lung Disease (ILD) aka Pulmonary Fibrosios   -  There are MANY varieties of this - the variety I am suspecting is IPF based on age > 30, male gender, prior smoking, work history, acid reflux histroy, crackling noise at bottom of lung and clubbing  Plan  - Do HRCT supine and prone witn inspiratory and expiratory images - next few days to few weeks  - might need PET scan based on CT rsult  - Do blood  09/10/2019 - Serum: ESR, ACE, ANA, DS-DNA, RF, anti-CCP,  ANCA screen, MPO, PR-3, Total CK,  Aldolase,   scl-70, ssA, ssB, anti-RNP, anti-JO-1,  Hypersensitivity Pneumonitis Panel  - Do blood 09/10/2019 - Check blood Cbc with diff, LFT and BMET  - Do ONO on room air  - start 2 L [place] portable o2 for exertion -to help quality of symptoms  - Do PFT test first available  Followup - 15 min telephone visit in 1- 2 weeks but after completing results - to discuss results (MD can be in [place] to do this telephone visit)  - can be with APP in [place] as well  - do not hold up visit because of PFT test    ( Level 05 visit:  New 60-74 min in  visit type: on-site  physical face to visit  in total care time and counseling or/and coordination of care by this undersigned MD - Dr Brand Males. This includes one or more of the following on this same day 09/10/2019: pre-charting, chart review, note writing, documentation discussion of test results, diagnostic or treatment recommendations, prognosis, risks and benefits of management options, instructions, education, compliance or risk-factor reduction. It excludes time spent by the [place] or office staff in the care of the patient. Actual time 61 min)  SIGNATURE    Dr. Brand Males, M.D., F.C.C.P,  Pulmonary and Critical Care Medicine Staff Physician, [place] Director - Interstitial Lung Disease  Program  Pulmonary [place] at [place], [place], [address]  Pager: [phone], If no answer or between  15:00h - 7:00h: call [phone] Telephone: [phone]  12:02 PM 09/10/2019

## 2019-09-11 DIAGNOSIS — J849 Interstitial pulmonary disease, unspecified: Secondary | ICD-10-CM | POA: Diagnosis not present

## 2019-09-11 LAB — ANTI-DNA ANTIBODY, DOUBLE-STRANDED: ds DNA Ab: 3 IU/mL (ref 0–9)

## 2019-09-11 LAB — ANTI-SCLERODERMA ANTIBODY: Scleroderma (Scl-70) (ENA) Antibody, IgG: <0.2 AI (ref 0.0–0.9)

## 2019-09-11 LAB — SJOGREN'S SYNDROME ANTIBODS(SSA + SSB)
SSA (Ro) (ENA) Antibody, IgG: 2.6 AI — ABNORMAL HIGH (ref 0.0–0.9)
SSB (La) (ENA) Antibody, IgG: <0.2 AI (ref 0.0–0.9)

## 2019-09-11 LAB — RHEUMATOID FACTOR: Rheumatoid fact SerPl-aCnc: 490.7 IU/mL — ABNORMAL HIGH (ref 0.0–13.9)

## 2019-09-11 LAB — ALDOLASE: Aldolase: 5.8 U/L (ref 3.3–10.3)

## 2019-09-11 LAB — RNP ANTIBODIES: Ribonucleic Protein: <0.2 AI (ref 0.0–0.9)

## 2019-09-11 LAB — ANGIOTENSIN CONVERTING ENZYME: Angiotensin-Converting Enzyme: 28 U/L (ref 14–82)

## 2019-09-13 ENCOUNTER — Telehealth: Payer: Self-pay | Admitting: Internal Medicine

## 2019-09-13 DIAGNOSIS — J849 Interstitial pulmonary disease, unspecified: Secondary | ICD-10-CM

## 2019-09-13 DIAGNOSIS — M2550 Pain in unspecified joint: Secondary | ICD-10-CM

## 2019-09-13 DIAGNOSIS — R768 Other specified abnormal immunological findings in serum: Secondary | ICD-10-CM

## 2019-09-13 LAB — CYCLIC CITRUL PEPTIDE ANTIBODY, IGG/IGA: CCP Antibodies IgG/IgA: 15 units (ref 0–19)

## 2019-09-13 NOTE — Telephone Encounter (Signed)
Pt is aware of date/time of covid test.   

## 2019-09-13 NOTE — Telephone Encounter (Signed)
Margie  His RF is very high - please refer to rheeumatologist. Who is there locally? Indication: ILD, positive RF and arthralgia

## 2019-09-13 NOTE — Telephone Encounter (Signed)
Pt is aware of results and voiced his understanding. Referral has been placed to Dr. Meda Coffee with [facility], as he is local.  Will route to MR as an FYI.

## 2019-09-14 ENCOUNTER — Ambulatory Visit
Admission: RE | Admit: 2019-09-14 | Discharge: 2019-09-14 | Disposition: A | Discharge status: Home / Self Care | Payer: PPO | Source: Ambulatory Visit | Attending: Internal Medicine | Admitting: Internal Medicine

## 2019-09-14 ENCOUNTER — Other Ambulatory Visit: Payer: Self-pay

## 2019-09-14 ENCOUNTER — Telehealth: Payer: Self-pay | Admitting: Internal Medicine

## 2019-09-14 DIAGNOSIS — J9809 Other diseases of bronchus, not elsewhere classified: Secondary | ICD-10-CM | POA: Diagnosis not present

## 2019-09-14 DIAGNOSIS — J849 Interstitial pulmonary disease, unspecified: Secondary | ICD-10-CM | POA: Diagnosis not present

## 2019-09-14 DIAGNOSIS — I2584 Coronary atherosclerosis due to calcified coronary lesion: Secondary | ICD-10-CM

## 2019-09-14 DIAGNOSIS — I2510 Atherosclerotic heart disease of native coronary artery without angina pectoris: Secondary | ICD-10-CM

## 2019-09-14 LAB — ANCA TITERS
Atypical P-ANCA titer: <1:20 {titer}
C-ANCA: <1:20 {titer}
P-ANCA: <1:20 {titer}

## 2019-09-14 NOTE — Telephone Encounter (Signed)
Pt is aware of below results and voiced his understanding.  Referral has been placed to cardiology.  Pt has been scheduled for OV with MR on 10/22/2019. Nothing further is needed.

## 2019-09-14 NOTE — Telephone Encounter (Signed)
Several findings on CT. Margie - please share results but tell him app will ggo over in detail  1. Fibrosis + - depending on eval with rheum - this can now end up being RA related but can explain at followup. 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  Emphysema + - at followu with TP - can start spiriva  3. No cancer  4.coronary and aortic valve calcification -> refer cards   Plan  - keep followupo with app in feb 2021 - tele visit  - give appt to see me in March 2021 - face to face in [place]  CT Chest High Resolution  Result Date: 09/14/2019 CLINICAL DATA:  64 year old male with history of worsening shortness of breath for the past several months. EXAM: CT CHEST WITHOUT CONTRAST TECHNIQUE: Multidetector CT imaging of the chest was performed following the standard protocol without intravenous contrast. High resolution imaging of the lungs, as well as inspiratory and expiratory imaging, was performed. COMPARISON:  No priors. FINDINGS: Cardiovascular: Heart size is normal. There is no significant pericardial fluid, thickening or pericardial calcification. There is aortic atherosclerosis, as well as atherosclerosis of the great vessels of the mediastinum and the coronary arteries, including calcified atherosclerotic plaque in the left main, left anterior descending, left circumflex and right coronary arteries. Mild calcifications of the aortic valve. 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 Mediastinum/Nodes: Multiple prominent borderline enlarged and mildly enlarged mediastinal and hilar lymph nodes are noted, measuring up to 1.7 cm in short axis in the right paratracheal nodal station. Esophagus is unremarkable in appearance. No axillary lymphadenopathy. Lungs/Pleura: High-resolution images demonstrate widespread areas of septal thickening, subpleural reticulation, parenchymal banding, traction bronchiectasis and extensive honeycombing, most severe  throughout the left lung. These findings have a definitive craniocaudal gradient. A small amount of ground-glass attenuation is also noted in the periphery of the right lung. Inspiratory and expiratory imaging is unremarkable. No acute consolidative airspace disease. Mild diffuse bronchial wall thickening with moderate centrilobular and paraseptal emphysema. No pleural effusions. No suspicious appearing pulmonary nodules or masses are noted. Upper Abdomen: Aortic atherosclerosis. Musculoskeletal: There are no aggressive appearing lytic or blastic lesions noted in the visualized portions of the skeleton. IMPRESSION: 1. The appearance of the lungs is compatible with interstitial lung disease, with a spectrum of findings considered diagnostic of usual interstitial pneumonia (UIP) per current ATS guidelines. 2. Mild diffuse bronchial wall thickening with moderate centrilobular and paraseptal emphysema; imaging findings suggestive of underlying COPD. 3. Aortic atherosclerosis, in addition to left main and 3 vessel coronary artery disease. Please note that although the presence of coronary artery calcium documents the presence of coronary artery disease, the severity of this disease and any potential stenosis cannot be assessed on this non-gated CT examination. Assessment for potential risk factor modification, dietary therapy or pharmacologic therapy may be warranted, if clinically indicated. 4. There are calcifications of the aortic valve. Echocardiographic correlation for evaluation of potential valvular dysfunction may be warranted if clinically indicated. 5. 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 Recommend annual imaging followup by CTA or MRA. This recommendation follows 2010 ACCF/AHA/AATS/ACR/ASA/SCA/SCAI/SIR/STS/SVM Guidelines for the Diagnosis and Management of Patients with Thoracic Aortic Disease. Circulation. 2010; [id number]. Aortic aneurysm NOS (ICD10-I71.9). Aortic Atherosclerosis  (ICD10-I70.0) and Emphysema (ICD10-J43.9). Electronically Signed   By: Vinnie Langton M.D.   On: 09/14/2019 11:59

## 2019-09-15 ENCOUNTER — Other Ambulatory Visit
Admission: RE | Admit: 2019-09-15 | Discharge: 2019-09-15 | Disposition: A | Discharge status: Home / Self Care | Payer: PPO | Source: Ambulatory Visit | Attending: Internal Medicine | Admitting: Internal Medicine

## 2019-09-15 DIAGNOSIS — Z20822 Contact with and (suspected) exposure to covid-19: Secondary | ICD-10-CM | POA: Insufficient documentation

## 2019-09-15 DIAGNOSIS — Z01812 Encounter for preprocedural laboratory examination: Secondary | ICD-10-CM | POA: Diagnosis not present

## 2019-09-15 LAB — HYPERSENSITIVITY PNEUMONITIS
A. Pullulans Abs: NEGATIVE
A.Fumigatus #1 Abs: NEGATIVE
Micropolyspora faeni, IgG: NEGATIVE
Pigeon Serum Abs: NEGATIVE
Thermoact. Saccharii: NEGATIVE
Thermoactinomyces vulgaris, IgG: NEGATIVE

## 2019-09-15 LAB — SARS CORONAVIRUS 2 (TAT 6-24 HRS): SARS Coronavirus 2: NEGATIVE

## 2019-09-16 ENCOUNTER — Other Ambulatory Visit: Payer: Self-pay

## 2019-09-16 ENCOUNTER — Ambulatory Visit: Payer: PPO | Attending: Internal Medicine

## 2019-09-16 DIAGNOSIS — J849 Interstitial pulmonary disease, unspecified: Secondary | ICD-10-CM

## 2019-09-16 MED ORDER — ALBUTEROL SULFATE (2.5 MG/3ML) 0.083% IN NEBU
2.5000 mg | INHALATION_SOLUTION | Freq: Once | RESPIRATORY_TRACT | Status: AC
  Administered 2019-09-16: 12:00:00 2.5 mg via RESPIRATORY_TRACT
  Filled 2019-09-16: qty 3

## 2019-09-22 ENCOUNTER — Other Ambulatory Visit: Payer: Self-pay

## 2019-09-22 ENCOUNTER — Ambulatory Visit (INDEPENDENT_AMBULATORY_CARE_PROVIDER_SITE_OTHER): Payer: PPO | Admitting: Adult Health

## 2019-09-22 ENCOUNTER — Encounter: Payer: Self-pay | Admitting: Adult Health

## 2019-09-22 DIAGNOSIS — J8410 Pulmonary fibrosis, unspecified: Secondary | ICD-10-CM

## 2019-09-22 MED ORDER — SPIRIVA RESPIMAT 2.5 MCG/ACT IN AERS: 2.0000 | INHALATION_SPRAY | Freq: Every day | RESPIRATORY_TRACT | 5 refills | Status: DC

## 2019-09-22 NOTE — Patient Instructions (Addendum)
Begin Spiriva Respimat 2 puffs daily .  Use Oxygen 2l/m with activity. .  Goal is for oxygen level to be >88-90% .  Order for Portable concentrator.  Order for ONO - check oxygen level At bedtime   Begin OFEV process. (OFEV 150mg  Twice daily  )  Will take a few weeks to get approval. Will have education on how to take once approved.  Follow up with Rheumatology as planned next month  Follow up with Dr. Chase Caller in 4 weeks and As needed    Please contact office for sooner follow up if symptoms do not improve or worsen or seek emergency care

## 2019-09-22 NOTE — Progress Notes (Signed)
Virtual Visit via Telephone Note  I connected with Lawrence Wells on 09/22/19 at 10:00 AM EST by telephone and verified that I am speaking with the correct person using two identifiers.  Location: Patient: Home  Provider: Office    I discussed the limitations, risks, security and privacy concerns of performing an evaluation and management service by telephone and the availability of in person appointments. I also discussed with the patient that there may be a patient responsible charge related to this service. The patient expressed understanding and agreed to proceed.   History of Present Illness: 64 year old male former smoker -heavy -seen for pulmonary consult 09/10/2019 for abnormal CT chest with interstitial lung disease and left upper lobe nodule along with progressive shortness of breath and dry cough over the last 6 months Medical history significant for arthritis.  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  Patient was seen last visit for a pulmonary consult for possible ILD.  Patient's had progressive shortness of breath and cough over the last 6 months.  Chest x-ray showed diffuse fibrotic changes and questionable nodule in the left apex.  Patient was set up for high-resolution CT chest that was completed on 09/14/2019 that showed widespread areas of septal thickening, subpleural reticulation, parenchymal banding, traction bronchiectasis with extensive honeycombing most severe throughout the left lung.  Craniocaudal gradient.  Groundglass attenuation in the peripheral right lung.  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  Moderate emphysema.  Atherosclerosis, ectasia of TA .  Patient was referred to cardiology for the atherosclerosis and ectasia of the thoracic aorta. Labs were positive for positive rheumatoid factor  and SSA ENA IgG.  Hypersensitivity panel was negative.  Scleroderma was negative.  Double-stranded DNA was negative.  CCP antibodies were negative, ANCA panel was negative.  Patient has been referred to rheumatology.  Has an appointment next month. Patient says his oxygen is helping however he likes to be very active and works a part-time job.  He needs a portable system that he can use to be more active.  We discussed ordering a POC device.  Pulmonary function testing was done and returned showing moderate restriction, no airflow obstruction.  DLCO 56%.  We discussed his test results. Answered questions from him and his family. He says he does get winded with activity but rest and then goes back to his activity.  He does have a daily dry cough.  He denies any hemoptysis.  Says appetite is fair.  Patient Active Problem List   Diagnosis Date Noted  . Pulmonary fibrosis ([place]) 09/08/2019  . Myalgia due to statin 09/08/2019  . Chronic hip pain, bilateral 06/09/2017  . Chronic bilateral low back pain with bilateral sciatica 06/09/2017  . Osteoarthritis of multiple joints 05/07/2017  . Genital herpes 05/07/2017  . Screening for prostate cancer 05/07/2017  . Hypercholesterolemia 03/30/2003  . Former smoker 02/26/1995   Current Outpatient Medications on File Prior to Visit  Medication Sig Dispense Refill  . albuterol (VENTOLIN HFA) 108 (90 Base) MCG/ACT inhaler Inhale 2 puffs into the lungs every 4 (four) hours as needed for wheezing or shortness of breath (cough). 8 g 2  . aspirin EC 81 MG tablet Take 1 tablet (81 mg total) by mouth daily.    . Budeson-Glycopyrrol-Formoterol (BREZTRI AEROSPHERE) 160-9-4.8 MCG/ACT AERO Inhale 2 puffs into the lungs 2 (two) times daily. 5.9  g 0  . diclofenac Sodium (VOLTAREN) 1 % GEL Apply 2 g topically 3 (three) times daily as needed. 100 g 2  . gabapentin (NEURONTIN) 100 MG capsule Start 1 capsule daily, increase by 1 cap every 2-3 days as tolerated up to 3 times a  day, or may take 3 at once in evening. 90 capsule 3  . Multiple Vitamin (MULTIVITAMIN) tablet Take 1 tablet by mouth daily.    . naproxen (NAPROSYN) 500 MG tablet Take 1 tablet (500 mg total) by mouth 2 (two) times daily with a meal. For 2-4 weeks then as needed 60 tablet 2  . rosuvastatin (CRESTOR) 5 MG tablet Take 1 tablet (5 mg total) by mouth once a week. 12 tablet 3   No current facility-administered medications on file prior to visit.       Observations/Objective: Walk test 09/10/2019 O2 saturation dropped to 88% on room air.  Corrected above 90% on 2 L of oxygen  Assessment and Plan: ILD-high-resolution CT chest compatible with UIP.  Considering patient's clinical presentation, pulmonary function testing and high-resolution CT chest. Most likely has an underlying rheumatological component with positive rheumatoid factor and Sjogren's labs.  Will refer to rheumatology await their work-up.  Considering the patient is having progressive shortness of breath and DLCO is at 56%.  We will go ahead and begin Ofev. Patient education to patient and family regarding A. fib and the approval process  Emphysema-patient has no significant airflow obstruction.  He is symptomatic and uses albuterol.  We will try Spiriva to see if this helps with his symptom control.  Chronic hypoxic respiratory failure.  Patient has exertional hypoxemia.  Patient is continue on oxygen 2 L.  Will order a POC to help with his mobility. Check an overnight oximetry for nocturnal hypoxemia   Plan  Patient Instructions  Begin Spiriva Respimat 2 puffs daily .  Use Oxygen 2l/m with activity. .  Goal is for oxygen level to be >88-90% .  Order for Portable concentrator.  Order for ONO - check oxygen level At bedtime   Begin OFEV process. (OFEV 150mg  Twice daily  )  Will take a few weeks to get approval. Will have education on how to take once approved.  Follow up with Rheumatology as planned next month  Follow up with  Dr. Chase Caller in 4 weeks and As needed    Please contact office for sooner follow up if symptoms do not improve or worsen or seek emergency care         Follow Up Instructions: Follow-up in 4 weeks and as needed   I discussed the assessment and treatment plan with the patient. The patient was provided an opportunity to ask questions and all were answered. The patient agreed with the plan and demonstrated an understanding of the instructions.   The patient was advised to call back or seek an in-person evaluation if the symptoms worsen or if the condition fails to improve as anticipated.  I provided 50 minutes of non-face-to-face time during this encounter.   Rexene Edison, NP

## 2019-09-24 ENCOUNTER — Telehealth: Payer: Self-pay | Admitting: Adult Health

## 2019-09-24 ENCOUNTER — Telehealth: Payer: Self-pay | Admitting: Pharmacy Technician

## 2019-09-24 NOTE — Telephone Encounter (Signed)
Submitted a Prior Authorization request to [facility] for OFEV 150mg  via Cover My Meds. Will update once we receive a response.  ([id number]) - [id number]

## 2019-09-24 NOTE — Telephone Encounter (Signed)
Received [employer] Start Paperwork. Will update as we work through the benefits process.  3:00 PM Lawrence Wells, CPhT

## 2019-09-24 NOTE — Telephone Encounter (Signed)
Error

## 2019-09-26 DIAGNOSIS — I2584 Coronary atherosclerosis due to calcified coronary lesion: Secondary | ICD-10-CM | POA: Insufficient documentation

## 2019-09-26 DIAGNOSIS — I2510 Atherosclerotic heart disease of native coronary artery without angina pectoris: Secondary | ICD-10-CM | POA: Insufficient documentation

## 2019-09-26 NOTE — Progress Notes (Signed)
Cardiology Office Note  Date:  09/27/2019   ID:  Lawrence Wells, DOB October 13, 1955, MRN [id number]  PCP:  Olin Hauser, DO   Chief Complaint  Patient presents with  . New Patient (Initial Visit)    Patietn c/o increased SOB. Meds reviewed verbally with patient.     HPI:  Lawrence Wells is a 64 yo male with PMH of  hyperlipidemia Former smoker ILD/Pulmonary fibrosis Referred by Dr. Brand Males for coronary calcification on CT scan  Pulmonary records reviewed diffuse ILD along with left upper lobe nodule.   Note indicating insidious onset of shortness of breath for a year with progression  CT scan chest 09/14/2019 Images pulled up and reviewed with him in detail Minimal carotid and aortic atherosclerosis noted At least moderate diffuse coronary calcification noted LAD and RCA in particular  Stopped smoking 2007 Tries to stay active but slowed down by breathing  Works part time, Environmental consultant, Systems analyst Work slowly, does not wear oxygen at work  oxygen, uses rarely, only when really bad  No regular exercise program  EKG personally reviewed by myself on todays visit Shows normal sinus rhythm with rate 51 bpm T wave abnormality precordial leads V1 through V5  Lab Results  Component Value Date   CHOL 210 (H) 09/02/2019   HDL 51 09/02/2019   LDLCALC 134 (H) 09/02/2019   TRIG 139 09/02/2019     PMH:   has a past medical history of Arthritis.  PSH:    Past Surgical History:  Procedure Laterality Date  . SINUS SURGERY WITH INSTATRAK      Current Outpatient Medications  Medication Sig Dispense Refill  . albuterol (VENTOLIN HFA) 108 (90 Base) MCG/ACT inhaler Inhale 2 puffs into the lungs every 4 (four) hours as needed for wheezing or shortness of breath (cough). 8 g 2  . Budeson-Glycopyrrol-Formoterol (BREZTRI AEROSPHERE) 160-9-4.8 MCG/ACT AERO Inhale 2 puffs into the lungs 2 (two) times daily. 5.9 g 0  . diclofenac Sodium (VOLTAREN) 1 % GEL  Apply 2 g topically 3 (three) times daily as needed. 100 g 2  . gabapentin (NEURONTIN) 100 MG capsule Start 1 capsule daily, increase by 1 cap every 2-3 days as tolerated up to 3 times a day, or may take 3 at once in evening. 90 capsule 3  . Multiple Vitamin (MULTIVITAMIN) tablet Take 1 tablet by mouth daily.    . naproxen (NAPROSYN) 500 MG tablet Take 1 tablet (500 mg total) by mouth 2 (two) times daily with a meal. For 2-4 weeks then as needed 60 tablet 2  . Tiotropium Bromide Monohydrate (SPIRIVA RESPIMAT) 2.5 MCG/ACT AERS Inhale 2 puffs into the lungs daily. 1 g 5   No current facility-administered medications for this visit.     Allergies:   Patient has no known allergies.   Social History:  The patient  reports that he quit smoking about 14 years ago. His smoking use included cigarettes. He has a 45.00 pack-year smoking history. He quit smokeless tobacco use about 14 years ago.  His smokeless tobacco use included chew. He reports current alcohol use. He reports that he does not use drugs.   Family History:   family history includes Heart attack (age of onset: 32) in his father; Heart attack (age of onset: 41) in his mother; Heart disease in his father and mother; Hyperlipidemia in his brother; Throat cancer (age of onset: 31) in his brother.    Review of Systems: Review of Systems  Constitutional: Negative.  HENT: Negative.   Respiratory: Positive for shortness of breath.   Cardiovascular: Negative.   Gastrointestinal: Negative.   Musculoskeletal: Negative.   Neurological: Negative.   Psychiatric/Behavioral: Negative.   All other systems reviewed and are negative.   PHYSICAL EXAM: VS:  BP 110/82 (BP Location: Right Arm, Patient Position: Sitting, Cuff Size: Normal)   Pulse (!) 51   Ht 5\' 11"  (1.803 m)   Wt 176 lb 8 oz (80.1 kg)   SpO2 93%   BMI 24.62 kg/m  , BMI Body mass index is 24.62 kg/m. GEN: Well nourished, well developed, in no acute distress HEENT: normal Neck:  no JVD, carotid bruits, or masses Cardiac: RRR; no murmurs, rubs, or gallops,no edema  Respiratory:  clear to auscultation bilaterally, normal work of breathing GI: soft, nontender, nondistended, + BS MS: no deformity or atrophy Skin: warm and dry, no rash Neuro:  Strength and sensation are intact Psych: euthymic mood, full affect   Recent Labs: 09/10/2019: ALT 16; BUN 28; Creatinine, Ser 0.77; Hemoglobin 16.5; Platelets 347; Potassium 4.2; Sodium 137    Lipid Panel Lab Results  Component Value Date   CHOL 210 (H) 09/02/2019   HDL 51 09/02/2019   LDLCALC 134 (H) 09/02/2019   TRIG 139 09/02/2019      Wt Readings from Last 3 Encounters:  09/27/19 176 lb 8 oz (80.1 kg)  09/10/19 177 lb (80.3 kg)  09/08/19 176 lb (79.8 kg)     ASSESSMENT AND PLAN:  Problem List Items Addressed This Visit      Cardiology Problems   Coronary artery calcification   Relevant Orders   EKG 12-Lead   Hypercholesterolemia     Other   Pulmonary fibrosis ([place]) - Primary   Former smoker    Other Visit Diagnoses    Centrilobular emphysema (HCC)         COPD Contributing to some of his shortness of breath Also with pulmonary fibrosis per CT scan, followed by pulmonary  Coronary artery disease CT scan images pulled up and discussed Heavy calcification LAD, RCA, some in the left circumflex Minimal in the aorta and carotids -Given his shortness of breath symptoms, inability to treadmill secondary to shortness of breath, we have ordered a pharmacologic Myoview to rule out ischemia Discussed risk and benefit of stress testing versus cardiac catheterization We will pursue stress testing at this time  Hyperlipidemia Unable to tolerate Crestor We will try a atorvastatin 10 mg daily Might need to add Zetia Goal LDL less than 70  Pulmonary fibrosis Followed by pulmonary, seen on CT scan images Waiting for portable oxygen generator for convenience  Disposition:   F/U  6  months    Signed, Esmond Plants, M.D., Ph.D. [place], [place]

## 2019-09-27 ENCOUNTER — Ambulatory Visit (INDEPENDENT_AMBULATORY_CARE_PROVIDER_SITE_OTHER): Payer: PPO | Admitting: Cardiovascular Disease

## 2019-09-27 ENCOUNTER — Encounter: Payer: Self-pay | Admitting: Cardiovascular Disease

## 2019-09-27 ENCOUNTER — Other Ambulatory Visit: Payer: Self-pay

## 2019-09-27 VITALS — BP 110/82 | HR 51 | Ht 71.0 in | Wt 176.5 lb

## 2019-09-27 DIAGNOSIS — I2510 Atherosclerotic heart disease of native coronary artery without angina pectoris: Secondary | ICD-10-CM

## 2019-09-27 DIAGNOSIS — J8410 Pulmonary fibrosis, unspecified: Secondary | ICD-10-CM | POA: Diagnosis not present

## 2019-09-27 DIAGNOSIS — E7800 Pure hypercholesterolemia, unspecified: Secondary | ICD-10-CM | POA: Diagnosis not present

## 2019-09-27 DIAGNOSIS — J432 Centrilobular emphysema: Secondary | ICD-10-CM

## 2019-09-27 DIAGNOSIS — Z87891 Personal history of nicotine dependence: Secondary | ICD-10-CM

## 2019-09-27 DIAGNOSIS — I2584 Coronary atherosclerosis due to calcified coronary lesion: Secondary | ICD-10-CM | POA: Diagnosis not present

## 2019-09-27 MED ORDER — ATORVASTATIN CALCIUM 10 MG PO TABS: 10.0000 mg | ORAL_TABLET | Freq: Every day | ORAL | 3 refills | Status: DC

## 2019-09-27 NOTE — Telephone Encounter (Signed)
Received notification from [facility] regarding a prior authorization for Lawrence Wells. Authorization has been APPROVED from 09/27/2019 to 09/26/2020, as long as patient is still enrolled with this plan.  Will send document to scan center.  Authorization # [id number]  Per plan, patient must fill [facility].  2:45 PM Beatriz Chancellor, CPhT

## 2019-09-27 NOTE — Telephone Encounter (Signed)
Received notification from [facility] regarding a prior authorization for OFEV 150mg . Authorization has been APPROVED from 09/24/19 to 09/23/20.   Authorization # [id number] Phone # [phone]  Ran test claim, patient's copay is $2,609.06 for 1 month supply of Ofev.  **When attempting to run test claim, patient's [facility] insurance is still showing as active. [employer] says that it is primary and requires PA. Submitted PA.  Submitted a Prior Authorization request to [facility] for OFEV 150mg  via Cover My Meds. Will update once we receive a response.  PA Case ID: [id number]

## 2019-09-27 NOTE — Patient Instructions (Addendum)
Medication Instructions:  Your physician has recommended you make the following change in your medication:  1. STOP Rosuvastatin (Crestor) 2. START Atorvastatin 10 mg once daily  We can write for Zetia 10 mg once a day if needed, for cholesterol  If you need a refill on your cardiac medications before your next appointment, please call your pharmacy.    Lab work: No new labs needed   If you have labs (blood work) drawn today and your tests are completely normal, you will receive your results only by: Marland Kitchen MyChart Message (if you have MyChart) OR . A paper copy in the mail If you have any lab test that is abnormal or we need to change your treatment, we will call you to review the results.   Testing/Procedures: Leane Call for shortness of breath Unable to treadmill, CAD on CT scan  [facility] MYOVIEW  Your caregiver has ordered a Stress Test with nuclear imaging. The purpose of this test is to evaluate the blood supply to your heart muscle. This procedure is referred to as a "Non-Invasive Stress Test." This is because other than having an IV started in your vein, nothing is inserted or "invades" your body. Cardiac stress tests are done to find areas of poor blood flow to the heart by determining the extent of coronary artery disease (CAD). Some patients exercise on a treadmill, which naturally increases the blood flow to your heart, while others who are  unable to walk on a treadmill due to physical limitations have a pharmacologic/chemical stress agent called Lexiscan . This medicine will mimic walking on a treadmill by temporarily increasing your coronary blood flow.   Please note: these test may take anywhere between 2-4 hours to complete  PLEASE REPORT TO [employer] AT THE FIRST DESK WILL DIRECT YOU WHERE TO GO  Date of Procedure:_____________________________________  Arrival Time for Procedure:______________________________   PLEASE NOTIFY THE  OFFICE AT LEAST 24 HOURS IN ADVANCE IF YOU ARE UNABLE TO KEEP YOUR APPOINTMENT.  [phone] AND  PLEASE NOTIFY NUCLEAR MEDICINE AT [facility] AT LEAST 24 HOURS IN ADVANCE IF YOU ARE UNABLE TO KEEP YOUR APPOINTMENT. [phone]  How to prepare for your Myoview test:  1. Do not eat or drink after midnight 2. No caffeine for 24 hours prior to test 3. No smoking 24 hours prior to test. 4. Your medication may be taken with water.  If your doctor stopped a medication because of this test, do not take that medication. 5. Ladies, please do not wear dresses.  Skirts or pants are appropriate. Please wear a short sleeve shirt. 6. No perfume, cologne or lotion. 7. Wear comfortable walking shoes. No heels!   Follow-Up: At [facility], you and your health needs are our priority.  As part of our continuing mission to provide you with exceptional heart care, we have created designated Provider Care Teams.  These Care Teams include your primary Cardiologist (physician) and Advanced Practice Providers (APPs -  Physician Assistants and Nurse Practitioners) who all work together to provide you with the care you need, when you need it.  . You will need a follow up appointment in 6 months   . Providers on your designated Care Team:   . Murray Hodgkins, NP . Christell Faith, PA-C . Marrianne Mood, PA-C  Any Other Special Instructions Will Be Listed Below (If Applicable).  For educational health videos Log in to : [email or web address] Or : [email or web address], password : [email or web address]

## 2019-09-29 ENCOUNTER — Encounter: Payer: Self-pay | Admitting: Adult Health

## 2019-09-29 DIAGNOSIS — R0902 Hypoxemia: Secondary | ICD-10-CM | POA: Diagnosis not present

## 2019-09-29 DIAGNOSIS — J449 Chronic obstructive pulmonary disease, unspecified: Secondary | ICD-10-CM | POA: Diagnosis not present

## 2019-09-29 NOTE — Telephone Encounter (Signed)
Patient scheduled for appointment on 10/06/2019 to discuss starting Ofev.  He will determine annual household income to see if he is eligible for grant or patient assistance.   [name], PharmD, [place], [place] Clinical Specialty Pharmacist [phone]  09/29/2019 3:49 PM

## 2019-09-30 NOTE — Telephone Encounter (Signed)
Called patient and spoke to him and his wife Lawrence Wells about his 2 active prescription plans. Patient's wife will call the [employer] to coordinate their plans and make them the secondary insurance plan. Reminded them on televisit appointment on 2/17 @ 9am.

## 2019-10-01 NOTE — Progress Notes (Signed)
Subjective:   Patient presents today to [facility] to see pharmacy team for Ofev medication management.  Pertinent past medical history includes IPF, history of tobacco abuse, and hyperlipidemia . He is naive to anti-fibrotic therapy.  Objective: No Known Allergies  Outpatient Encounter Medications as of 10/06/2019  Medication Sig  . albuterol (VENTOLIN HFA) 108 (90 Base) MCG/ACT inhaler Inhale 2 puffs into the lungs every 4 (four) hours as needed for wheezing or shortness of breath (cough).  Marland Kitchen atorvastatin (LIPITOR) 10 MG tablet Take 1 tablet (10 mg total) by mouth daily.  . Budeson-Glycopyrrol-Formoterol (BREZTRI AEROSPHERE) 160-9-4.8 MCG/ACT AERO Inhale 2 puffs into the lungs 2 (two) times daily.  . diclofenac Sodium (VOLTAREN) 1 % GEL Apply 2 g topically 3 (three) times daily as needed.  . gabapentin (NEURONTIN) 100 MG capsule Start 1 capsule daily, increase by 1 cap every 2-3 days as tolerated up to 3 times a day, or may take 3 at once in evening.  . Multiple Vitamin (MULTIVITAMIN) tablet Take 1 tablet by mouth daily.  . naproxen (NAPROSYN) 500 MG tablet Take 1 tablet (500 mg total) by mouth 2 (two) times daily with a meal. For 2-4 weeks then as needed  . Tiotropium Bromide Monohydrate (SPIRIVA RESPIMAT) 2.5 MCG/ACT AERS Inhale 2 puffs into the lungs daily.   No facility-administered encounter medications on file as of 10/06/2019.     Immunization History  Administered Date(s) Administered  . Pneumococcal Conjugate-13 09/08/2019  . Tdap 04/19/2015, 05/07/2017     HRCT 09/14/2019: traction bronchiectasis and extensive honeycombing along with small amount of ground-glass attenuation is compatible with interstitial lung disease, with a spectrum of findings considered diagnostic of usual interstitial pneumonia (UIP) per current ATS guidelines.  PFT's No results found for: FEV1, FVC, FEV1FVC, TLC, DLCO    CMP     Component Value Date/Time   NA 137 09/10/2019 1258   K 4.2  09/10/2019 1258   CL 103 09/10/2019 1258   CO2 23 09/10/2019 1258   GLUCOSE 91 09/10/2019 1258   BUN 28 (H) 09/10/2019 1258   CREATININE 0.77 09/10/2019 1258   CREATININE 0.92 09/02/2019 0836   CALCIUM 9.2 09/10/2019 1258   PROT 8.3 (H) 09/10/2019 1258   ALBUMIN 3.8 09/10/2019 1258   AST 22 09/10/2019 1258   ALT 16 09/10/2019 1258   ALKPHOS 112 09/10/2019 1258   BILITOT 0.9 09/10/2019 1258   GFRNONAA >60 09/10/2019 1258   GFRNONAA 87 09/02/2019 0836   GFRAA >60 09/10/2019 1258   GFRAA 101 09/02/2019 0836     CBC    Component Value Date/Time   WBC 11.2 (H) 09/10/2019 1258   RBC 5.26 09/10/2019 1258   HGB 16.5 09/10/2019 1258   HCT 49.5 09/10/2019 1258   PLT 347 09/10/2019 1258   MCV 94.1 09/10/2019 1258   MCH 31.4 09/10/2019 1258   MCHC 33.3 09/10/2019 1258   RDW 12.4 09/10/2019 1258   LYMPHSABS 3.9 09/10/2019 1258   MONOABS 0.9 09/10/2019 1258   EOSABS 0.2 09/10/2019 1258   BASOSABS 0.1 09/10/2019 1258     LFT's Hepatic Function Latest Ref Rng & Units 09/10/2019 09/02/2019 05/07/2017  Total Protein 6.5 - 8.1 g/dL 8.3(H) 7.1 7.1  Albumin 3.5 - 5.0 g/dL 3.8 - -  AST 15 - 41 U/L '22 17 21  ' ALT 0 - 44 U/L '16 10 17  ' Alk Phosphatase 38 - 126 U/L 112 - -  Total Bilirubin 0.3 - 1.2 mg/dL 0.9 0.4 0.4  Bilirubin, Direct  0.0 - 0.2 mg/dL <0.1 - -     Assessment and Plan  1. Ofev Medication Management  Patient counseled on purpose, proper use, and potential adverse effects including diarrhea, nausea, vomiting, abdominal pain, decreased appetite, weight loss, and increased blood pressure. Stressed the importance of routine lab monitoring. Will monitor LFT's every month for the first 6 months of treatment then every 3 months. Will monitor CBC every 3 months.   Ofev dose will be 150 mg capsule every 12 hours with food. Stressed importance of taking with food to minimize stomach upset.  Dickey Gave has been approved through insurance.  Patient has both a Medicare advantage plan and [place].  Ofev was approved through both.  They are both currently listed as the primary insurance.  Ran test claim through Medicare advantage plan and co-pay was almost $2700 a month likely due to a deductible.  Unable to run a test claim through [place] as [facility].  Since [place] is [employer] he may be able to use a co-pay card but cannot make any guarantees.  Patient household size is 2 and annual gross income of $110,000.  He is not eligible for a grant.  Advised that he may be eligible for patient assistance through [facility] cares.  Patient will download application online and return to our office so we can fax for approval.    In the meantime [facility] to determine what co-pay would be through [place].  2. Medication Reconciliation  A drug regimen assessment was performed, including review of allergies, interactions, disease-state management, dosing and immunization history. Medications were reviewed with the patient, including name, instructions, indication, goals of therapy, potential side effects, importance of adherence, and safe use.  No major drug interactions identified.  3. Immunizations  Received Prevnar 13 in January. Recommend annual influenzae, Pneumovax 23, Covid-19 and shingles vaccinations.  All questions encouraged and answered.  Instructed patient to call with any other questions or concerns.  Thank you for allowing pharmacy to participate in this patient's care.   This appointment required  40 minutes of patient care (this includes precharting, chart review, review of results, face-to-face care, etc.).   [name], PharmD, [place], [place] Clinical Specialty Pharmacist [phone]  10/06/2019 4:25 PM

## 2019-10-05 ENCOUNTER — Telehealth: Payer: Self-pay | Admitting: Adult Health

## 2019-10-05 ENCOUNTER — Other Ambulatory Visit: Payer: Self-pay

## 2019-10-05 ENCOUNTER — Encounter: Payer: Self-pay | Admitting: Gastroenterology

## 2019-10-05 DIAGNOSIS — J8410 Pulmonary fibrosis, unspecified: Secondary | ICD-10-CM

## 2019-10-05 DIAGNOSIS — J849 Interstitial pulmonary disease, unspecified: Secondary | ICD-10-CM

## 2019-10-05 NOTE — Telephone Encounter (Signed)
Patient last seen 2.3.2021 by TP for televisit ONO on room air ordered to check for nocturnal hypoxemia  ONO results received and reviewed by TP: +nocturnal desats, begin O2 2L at bedtime   Called spoke with patient, discussed ONO results Patient voiced his understanding and denied any questions/concerns at this time  Order sent to [employer] Nothing further needed; will sign off

## 2019-10-06 ENCOUNTER — Telehealth (INDEPENDENT_AMBULATORY_CARE_PROVIDER_SITE_OTHER): Payer: PPO | Admitting: Pharmacist

## 2019-10-06 ENCOUNTER — Encounter: Payer: Self-pay | Admitting: Anesthesiology

## 2019-10-06 DIAGNOSIS — Z79899 Other long term (current) drug therapy: Secondary | ICD-10-CM

## 2019-10-06 DIAGNOSIS — J8410 Pulmonary fibrosis, unspecified: Secondary | ICD-10-CM

## 2019-10-06 MED ORDER — OFEV 150 MG PO CAPS: 150.0000 mg | ORAL_CAPSULE | Freq: Two times a day (BID) | ORAL | 2 refills | Status: DC

## 2019-10-06 NOTE — Patient Instructions (Signed)
   Complete patient assistance application and return to the office  Determine co-pay at [facility] through [place]

## 2019-10-06 NOTE — Progress Notes (Signed)
Wait on results from cardiac stress test

## 2019-10-07 ENCOUNTER — Other Ambulatory Visit
Admission: RE | Admit: 2019-10-07 | Discharge: 2019-10-07 | Disposition: A | Discharge status: Home / Self Care | Payer: PPO | Source: Ambulatory Visit | Attending: Gastroenterology | Admitting: Gastroenterology

## 2019-10-07 DIAGNOSIS — Z20822 Contact with and (suspected) exposure to covid-19: Secondary | ICD-10-CM | POA: Insufficient documentation

## 2019-10-07 DIAGNOSIS — Z01812 Encounter for preprocedural laboratory examination: Secondary | ICD-10-CM | POA: Insufficient documentation

## 2019-10-07 LAB — SARS CORONAVIRUS 2 (TAT 6-24 HRS): SARS Coronavirus 2: NEGATIVE

## 2019-10-08 ENCOUNTER — Telehealth: Payer: Self-pay

## 2019-10-08 ENCOUNTER — Other Ambulatory Visit: Payer: PPO

## 2019-10-08 NOTE — Telephone Encounter (Signed)
Spoke with pt and informed him that we will need to postpone his screening colonoscopy until after he's completed his cardiac work up and has cardiac clearance for the procedure. I explained that once he's completed his cardiac test as planned by Dr. Rockey Situ, we will then send a cardiac clearance request. Pt agrees with postponing the colonoscopy and plans to contact our office to reschedule once cardiac testing is complete. [facility] has been notified.

## 2019-10-08 NOTE — Anesthesia Preprocedure Evaluation (Deleted)
Anesthesia Evaluation    Airway        Dental   Pulmonary COPD (Supplemental O2 @ home prn), former smoker,   Interstitial lung disease/pulmonary fibrosis          Cardiovascular + CAD     HLD   Neuro/Psych  Chronic back pain  Neuromuscular disease (Sciatica)    GI/Hepatic   Endo/Other    Renal/GU      Musculoskeletal  (+) Arthritis ,   Abdominal   Peds  Hematology   Anesthesia Other Findings   Reproductive/Obstetrics                             Anesthesia Physical Anesthesia Plan  ASA: III  Anesthesia Plan: General   Post-op Pain Management:    Induction: Intravenous  PONV Risk Score and Plan: 2 and Propofol infusion, TIVA and Treatment may vary due to age or medical condition  Airway Management Planned: Natural Airway and Nasal Cannula  Additional Equipment:   Intra-op Plan:   Post-operative Plan:   Informed Consent: I have reviewed the patients History and Physical, chart, labs and discussed the procedure including the risks, benefits and alternatives for the proposed anesthesia with the patient or authorized representative who has indicated his/her understanding and acceptance.       Plan Discussed with: CRNA and Anesthesiologist  Anesthesia Plan Comments:         Anesthesia Quick Evaluation

## 2019-10-08 NOTE — Discharge Instructions (Signed)
General Anesthesia, Adult, Care After This sheet gives you information about how to care for yourself after your procedure. Your health care provider may also give you more specific instructions. If you have problems or questions, contact your health care provider. What can I expect after the procedure? After the procedure, the following side effects are common:  Pain or discomfort at the IV site.  Nausea.  Vomiting.  Sore throat.  Trouble concentrating.  Feeling cold or chills.  Weak or tired.  Sleepiness and fatigue.  Soreness and body aches. These side effects can affect parts of the body that were not involved in surgery. Follow these instructions at home:  For at least 24 hours after the procedure:  Have a responsible adult stay with you. It is important to have someone help care for you until you are awake and alert.  Rest as needed.  Do not: ? Participate in activities in which you could fall or become injured. ? Drive. ? Use heavy machinery. ? Drink alcohol. ? Take sleeping pills or medicines that cause drowsiness. ? Make important decisions or sign legal documents. ? Take care of children on your own. Eating and drinking  Follow any instructions from your health care provider about eating or drinking restrictions.  When you feel hungry, start by eating small amounts of foods that are soft and easy to digest (bland), such as toast. Gradually return to your regular diet.  Drink enough fluid to keep your urine pale yellow.  If you vomit, rehydrate by drinking water, juice, or clear broth. General instructions  If you have sleep apnea, surgery and certain medicines can increase your risk for breathing problems. Follow instructions from your health care provider about wearing your sleep device: ? Anytime you are sleeping, including during daytime naps. ? While taking prescription pain medicines, sleeping medicines, or medicines that make you drowsy.  Return to  your normal activities as told by your health care provider. Ask your health care provider what activities are safe for you.  Take over-the-counter and prescription medicines only as told by your health care provider.  If you smoke, do not smoke without supervision.  Keep all follow-up visits as told by your health care provider. This is important. Contact a health care provider if:  You have nausea or vomiting that does not get better with medicine.  You cannot eat or drink without vomiting.  You have pain that does not get better with medicine.  You are unable to pass urine.  You develop a skin rash.  You have a fever.  You have redness around your IV site that gets worse. Get help right away if:  You have difficulty breathing.  You have chest pain.  You have blood in your urine or stool, or you vomit blood. Summary  After the procedure, it is common to have a sore throat or nausea. It is also common to feel tired.  Have a responsible adult stay with you for the first 24 hours after general anesthesia. It is important to have someone help care for you until you are awake and alert.  When you feel hungry, start by eating small amounts of foods that are soft and easy to digest (bland), such as toast. Gradually return to your regular diet.  Drink enough fluid to keep your urine pale yellow.  Return to your normal activities as told by your health care provider. Ask your health care provider what activities are safe for you. This information is not   intended to replace advice given to you by your health care provider. Make sure you discuss any questions you have with your health care provider. Document Revised: 08/08/2017 Document Reviewed: 03/21/2017 Elsevier Patient Education  2020 [employer]vier Inc.  

## 2019-10-09 ENCOUNTER — Encounter: Payer: Self-pay | Admitting: Intensive Care

## 2019-10-09 ENCOUNTER — Other Ambulatory Visit: Payer: Self-pay

## 2019-10-09 ENCOUNTER — Emergency Department: Payer: PPO

## 2019-10-09 ENCOUNTER — Emergency Department
Admission: EM | Admit: 2019-10-09 | Discharge: 2019-10-09 | Disposition: A | Discharge status: Home / Self Care | Payer: PPO | Attending: Emergency Medicine | Admitting: Emergency Medicine

## 2019-10-09 DIAGNOSIS — Z87891 Personal history of nicotine dependence: Secondary | ICD-10-CM | POA: Insufficient documentation

## 2019-10-09 DIAGNOSIS — Z79899 Other long term (current) drug therapy: Secondary | ICD-10-CM | POA: Insufficient documentation

## 2019-10-09 DIAGNOSIS — M25512 Pain in left shoulder: Secondary | ICD-10-CM | POA: Diagnosis not present

## 2019-10-09 DIAGNOSIS — M25522 Pain in left elbow: Secondary | ICD-10-CM | POA: Diagnosis not present

## 2019-10-09 HISTORY — DX: Emphysema, unspecified: J43.9

## 2019-10-09 MED ORDER — LIDOCAINE 5 % EX PTCH
1.0000 | MEDICATED_PATCH | CUTANEOUS | Status: DC
  Administered 2019-10-09: 1 via TRANSDERMAL
  Filled 2019-10-09: qty 1

## 2019-10-09 MED ORDER — PREDNISONE 20 MG PO TABS
60.0000 mg | ORAL_TABLET | Freq: Once | ORAL | Status: AC
  Administered 2019-10-09: 60 mg via ORAL
  Filled 2019-10-09: qty 3

## 2019-10-09 MED ORDER — LIDOCAINE 5 % EX PTCH: 1.0000 | MEDICATED_PATCH | Freq: Two times a day (BID) | CUTANEOUS | 0 refills | Status: DC

## 2019-10-09 MED ORDER — METHYLPREDNISOLONE 4 MG PO TBPK: ORAL_TABLET | ORAL | 0 refills | Status: DC

## 2019-10-09 NOTE — ED Notes (Signed)
See triage note  Presents with left shoulder pain for about 1-2 years  Thinks he has had possible rotator cuff problems  But during the night he developed increased pain at left elbow   States he is having increased pain with movement  Denies any injury  Good pulses

## 2019-10-09 NOTE — Discharge Instructions (Addendum)
Follow discharge care instructions.  Continue previous medication except for naproxen while taking steroids.  Change patches every 12 hours.  Follow-up with schedule arthritis/orthopedic appointment.

## 2019-10-09 NOTE — ED Triage Notes (Signed)
Patient c/o left shoulder pain for years and reports it has progressively gotten worse the last couple of weeks. Denies injury

## 2019-10-09 NOTE — ED Provider Notes (Signed)
[facility] Emergency Department Provider Note   ____________________________________________   First MD Initiated Contact with Patient 10/09/19 [phone]     (approximate)  I have reviewed the triage vital signs and the nursing notes.   HISTORY  Chief Complaint Shoulder Pain    HPI TRAYQUAN CAMPEAU is a 64 y.o. male chronic left shoulder/elbow pain which has increased in the past couple weeks.  States the pain is at the [facility] joint and posterior elbow.  Pain increased with overhead reaching, abduction, and pronation/supination of left forearm.  No provocative incident for complaint.  Patient rates pain as a 3/10.  Patient described the pain as "achy".  Patient has  mention this to his PCP.  Patient has a schedule appointment earlier next month see Ortho doctor.  Patient came in today because he had a trouble sleeping secondary to his complaint last night.      Past Medical History:  Diagnosis Date  . Arthritis   . COPD (chronic obstructive pulmonary disease) ([place])   . Coronary artery disease   . Dyspnea   . Emphysema lung ([place])   . Hyperlipidemia   . Pulmonary filariasis     Patient Active Problem List   Diagnosis Date Noted  . Coronary artery calcification 09/26/2019  . Pulmonary fibrosis ([place]) 09/08/2019  . Myalgia due to statin 09/08/2019  . Chronic hip pain, bilateral 06/09/2017  . Chronic bilateral low back pain with bilateral sciatica 06/09/2017  . Osteoarthritis of multiple joints 05/07/2017  . Genital herpes 05/07/2017  . Screening for prostate cancer 05/07/2017  . Hypercholesterolemia 03/30/2003  . Former smoker 02/26/1995    Past Surgical History:  Procedure Laterality Date  . SINUS SURGERY WITH INSTATRAK      Prior to Admission medications   Medication Sig Start Date End Date Taking? Authorizing Provider  albuterol (VENTOLIN HFA) 108 (90 Base) MCG/ACT inhaler Inhale 2 puffs into the lungs every 4 (four) hours as needed for wheezing  or shortness of breath (cough). 09/08/19   Karamalegos, Devonne Doughty, DO  atorvastatin (LIPITOR) 10 MG tablet Take 1 tablet (10 mg total) by mouth daily. 09/27/19 12/26/19  Minna Merritts, MD  diclofenac Sodium (VOLTAREN) 1 % GEL Apply 2 g topically 3 (three) times daily as needed. 07/19/19   Karamalegos, Devonne Doughty, DO  gabapentin (NEURONTIN) 100 MG capsule Start 1 capsule daily, increase by 1 cap every 2-3 days as tolerated up to 3 times a day, or may take 3 at once in evening. 09/08/19   Karamalegos, Devonne Doughty, DO  Multiple Vitamin (MULTIVITAMIN) tablet Take 1 tablet by mouth daily.    [provider]  naproxen (NAPROSYN) 500 MG tablet Take 1 tablet (500 mg total) by mouth 2 (two) times daily with a meal. For 2-4 weeks then as needed 09/08/19   Parks Ranger, Devonne Doughty, DO  Nintedanib (OFEV) 150 MG CAPS Take 1 capsule (150 mg total) by mouth 2 (two) times daily. 10/06/19   Brand Males, MD  OXYGEN Inhale into the lungs as needed.    [provider]  Tiotropium Bromide Monohydrate (SPIRIVA RESPIMAT) 2.5 MCG/ACT AERS Inhale 2 puffs into the lungs daily. 09/22/19   Parrett, Fonnie Mu, NP    Allergies Patient has no known allergies.  Family History  Problem Relation Age of Onset  . Heart disease Mother   . Heart attack Mother 81  . Heart disease Father   . Heart attack Father 22  . Hyperlipidemia Brother   . Throat cancer  Brother 65    Social History Social History   Tobacco Use  . Smoking status: Former Smoker    Packs/day: 1.50    Years: 30.00    Pack years: 45.00    Types: Cigarettes    Quit date: 2007    Years since quitting: 14.1  . Smokeless tobacco: Former Systems developer    Types: Chew    Quit date: 2007  . Tobacco comment: Dip smokeless tobacco >20-30 years  Substance Use Topics  . Alcohol use: Yes    Alcohol/week: 6.0 standard drinks    Types: 6 Cans of beer per week    Comment: 2-5 can daily- 09/10/2019  . Drug use: No    Review of  Systems  Constitutional: No fever/chills Eyes: No visual changes. ENT: No sore throat. Cardiovascular: Denies chest pain. Respiratory: Denies shortness of breath.  COPD Gastrointestinal: No abdominal pain.  No nausea, no vomiting.  No diarrhea.  No constipation. Genitourinary: Negative for dysuria. Musculoskeletal: Left shoulder, left elbow, and left forearm pain.  Skin: Negative for rash. Neurological: Negative for headaches, focal weakness or numbness. Endocrine:  Hyperlipidemia ____________________________________________   PHYSICAL EXAM:  VITAL SIGNS: ED Triage Vitals  Enc Vitals Group     BP 10/09/19 0838 123/82     Pulse Rate 10/09/19 0838 72     Resp 10/09/19 0838 16     Temp 10/09/19 0837 97.9 F (36.6 C)     Temp Source 10/09/19 0837 Oral     SpO2 10/09/19 0838 93 %     Weight 10/09/19 0837 174 lb (78.9 kg)     Height 10/09/19 0837 5\' 11"  (1.803 m)     Head Circumference --      Peak Flow --      Pain Score 10/09/19 0837 3     Pain Loc --      Pain Edu? --      Excl. in ? --    Constitutional: Alert and oriented. Well appearing and in no acute distress. Neck: No cervical spine tenderness to palpation. Hematological/Lymphatic/Immunilogical: No cervical lymphadenopathy. Cardiovascular: Normal rate, regular rhythm. Grossly normal heart sounds.  Good peripheral circulation. Respiratory: Normal respiratory effort.  No retractions. Lungs CTAB. Gastrointestinal: Soft and nontender. No distention. No abdominal bruits. No CVA tenderness. Musculoskeletal: No obvious deformity to the left upper extremity.  Patient decreased range of motion as described above.  Patient is moderate guarding palpation of the [place] joint.  No lower extremity tenderness nor edema.  No joint effusions. Neurologic:  Normal speech and language. No gross focal neurologic deficits are appreciated. No gait instability. Skin:  Skin is warm, dry and intact. No rash noted. Psychiatric: Mood and affect are  normal. Speech and behavior are normal.  ____________________________________________   LABS (all labs ordered are listed, but only abnormal results are displayed)  Labs Reviewed - No data to display ____________________________________________  EKG   ____________________________________________  RADIOLOGY  ED MD interpretation:    Official radiology report(s): DG Elbow Complete Left  Result Date: 10/09/2019 CLINICAL DATA:  Left elbow pain with pronation and supination, nontraumatic. EXAM: LEFT ELBOW - COMPLETE 3+ VIEW COMPARISON:  None. FINDINGS: There is no evidence of fracture, dislocation, or joint effusion. There is no evidence of arthropathy or other focal bone abnormality. Soft tissues are unremarkable. IMPRESSION: No acute osseous abnormality. No significant left elbow arthropathy. Electronically Signed   By: Ilona Sorrel M.D.   On: 10/09/2019 09:46   DG Shoulder Left  Result Date: 10/09/2019 CLINICAL  DATA:  Chronic left shoulder pain for 1-2 years. No recent injury but worsening symptoms EXAM: LEFT SHOULDER - 2+ VIEW COMPARISON:  None. FINDINGS: There is no evidence of fracture or dislocation. There is no evidence of arthropathy or other focal bone abnormality. Soft tissues are unremarkable. Interstitial coarsening in the lung that correlates with emphysema and fibrosis by recent chest CT. IMPRESSION: Negative left shoulder. Electronically Signed   By: Monte Fantasia M.D.   On: 10/09/2019 09:14    ____________________________________________   PROCEDURES  Procedure(s) performed (including Critical Care):  Procedures   ____________________________________________   INITIAL IMPRESSION / ASSESSMENT AND PLAN / ED COURSE  As part of my medical decision making, I reviewed the following data within the [place]     Patient presents with left shoulder and elbow pain.  Patient shoulder pain has been chronic over the past few years.  Patient states  pain has increased and last couple weeks.  Physical exam is grossly unremarkable except for decreased range of motion guarding at the [facility] joint.  Discussed negative x-ray findings with patient.  Patient placed in arm sling for comfort and given tapered dose of steroids to use with Lidoderm patches.  Patient advised to follow-up with scheduled orthopedic/arthritic appointment early next month.    JAYCEION THERRIAULT was evaluated in Emergency Department on 10/09/2019 for the symptoms described in the history of present illness. He was evaluated in the context of the global COVID-19 pandemic, which necessitated consideration that the patient might be at risk for infection with the SARS-CoV-2 virus that causes COVID-19. Institutional protocols and algorithms that pertain to the evaluation of patients at risk for COVID-19 are in a state of rapid change based on information released by regulatory bodies including the CDC and federal and state organizations. These policies and algorithms were followed during the patient's care in the ED.       ____________________________________________   FINAL CLINICAL IMPRESSION(S) / ED DIAGNOSES  Final diagnoses:  Acute pain of left shoulder     ED Discharge Orders    None       Note:  This document was prepared using Dragon voice recognition software and may include unintentional dictation errors.    Sable Feil, PA-C 10/09/19 1005    Carrie Mew, MD 10/09/19 [phone]

## 2019-10-11 ENCOUNTER — Telehealth: Payer: Self-pay | Admitting: Pharmacist

## 2019-10-11 ENCOUNTER — Ambulatory Visit
Admission: RE | Admit: 2019-10-11 | Payer: BC Managed Care – PPO | Source: Home / Self Care | Admitting: Gastroenterology

## 2019-10-11 HISTORY — DX: Hyperlipidemia, unspecified: E78.5

## 2019-10-11 HISTORY — DX: Atherosclerotic heart disease of native coronary artery without angina pectoris: I25.10

## 2019-10-11 HISTORY — DX: Dyspnea, unspecified: R06.00

## 2019-10-11 HISTORY — DX: Chronic obstructive pulmonary disease, unspecified: J44.9

## 2019-10-11 HISTORY — DX: Respiratory disorders in diseases classified elsewhere: J99

## 2019-10-11 HISTORY — DX: Filariasis, unspecified: B74.9

## 2019-10-11 SURGERY — COLONOSCOPY WITH PROPOFOL
Anesthesia: Choice

## 2019-10-11 NOTE — Telephone Encounter (Signed)
Received email from patient's wife stating that [facility] out to [facility] about setting up patient's first shipment of Ofev.  They were informed that patient's co-pay would be 0.  I assume they enrolled patient in co-pay card.  They are to receive first shipment tomorrow.  [name] wanted to know if he can go ahead and start his medication.  The pharmacist mentioned something about having liver function test first.  Replied to email informing Lawrence Wells that Lawrence Wells can go ahead and start Ofev medication when it arrives tomorrow.  He already had baseline LFTs and he will be due for repeat CMP/CBC in 1 month.  Advised that he can have those labs drawn at the [place] office and orders are already in place.   [name], PharmD, [place], [place] Clinical Specialty Pharmacist [phone]  10/11/2019 3:40 PM

## 2019-10-12 DIAGNOSIS — J849 Interstitial pulmonary disease, unspecified: Secondary | ICD-10-CM | POA: Diagnosis not present

## 2019-10-14 ENCOUNTER — Other Ambulatory Visit: Payer: Self-pay

## 2019-10-14 ENCOUNTER — Ambulatory Visit
Admission: RE | Admit: 2019-10-14 | Discharge: 2019-10-14 | Disposition: A | Discharge status: Home / Self Care | Payer: PPO | Source: Ambulatory Visit | Attending: Cardiovascular Disease | Admitting: Cardiovascular Disease

## 2019-10-14 DIAGNOSIS — I2584 Coronary atherosclerosis due to calcified coronary lesion: Secondary | ICD-10-CM | POA: Diagnosis not present

## 2019-10-14 DIAGNOSIS — I2510 Atherosclerotic heart disease of native coronary artery without angina pectoris: Secondary | ICD-10-CM | POA: Diagnosis not present

## 2019-10-14 LAB — NM MYOCAR MULTI W/SPECT W/WALL MOTION / EF
Estimated workload: 1 METS
Exercise duration (min): 0 min
Exercise duration (sec): 0 s
LV dias vol: 73 mL (ref 62–150)
LV sys vol: 25 mL
MPHR: 155 {beats}/min
Peak HR: 89 {beats}/min
Percent HR: 57 %
Rest HR: 58 {beats}/min
SDS: 0
SRS: 1
SSS: 1
TID: 0.85

## 2019-10-14 MED ORDER — TECHNETIUM TC 99M TETROFOSMIN IV KIT
10.7110 | PACK | Freq: Once | INTRAVENOUS | Status: AC | PRN
  Administered 2019-10-14: 08:00:00 10.711 via INTRAVENOUS

## 2019-10-14 MED ORDER — REGADENOSON 0.4 MG/5ML IV SOLN
0.4000 mg | Freq: Once | INTRAVENOUS | Status: AC
  Administered 2019-10-14: 0.4 mg via INTRAVENOUS

## 2019-10-14 MED ORDER — TECHNETIUM TC 99M TETROFOSMIN IV KIT
31.2880 | PACK | Freq: Once | INTRAVENOUS | Status: AC | PRN
  Administered 2019-10-14: 09:00:00 31.288 via INTRAVENOUS

## 2019-10-18 ENCOUNTER — Telehealth: Payer: Self-pay | Admitting: *Deleted

## 2019-10-18 NOTE — Telephone Encounter (Signed)
-----   Message from Minna Merritts, MD sent at 10/17/2019  9:44 PM EST ----- Stress test No ischemia, low risk study Hope he can tolerate lipitor May need to add zetia to low dose lipitor to reach LDL <70

## 2019-10-18 NOTE — Telephone Encounter (Signed)
Spoke with patient and reviewed results of testing. Him and wife were on the phone and reports that he was on the generic Lipitor and had to go to ED because he was hurting so bad. He has tried both generic meds for Crestor & Lipitor. He was hurting so bad that he went to ED. So he has been off that for about a week. So they want to know what other medication to try. Advised that I would send to provider and call back with recommendations. They both verbalized understanding of results, and plan for return call regarding medication.

## 2019-10-20 NOTE — Progress Notes (Signed)
Office Visit Note  Patient: Lawrence Wells             Date of Birth: 07/10/1956           MRN: [id number]             PCP: Olin Hauser, DO Referring: Nobie Putnam * Visit Date: 10/21/2019 Occupation: '@GUAROCC' @  Subjective:  Pain in multiple joints.   History of Present Illness: Lawrence Wells is a 64 y.o. male seen in consultation per request of Dr. Chase Caller.  According to patient his symptoms started about 1 year ago with shortness of breath.  2 months ago he went for a physical and had a chest x-ray followed by high-resolution CT which was consistent with interstitial lung disease.  He was referred to Dr. Chase Caller after evaluation he diagnosed her with interstitial lung disease.  He was also placed on 2 L of oxygen which he uses on exertion or at night.  He states he has been experiencing pain and swelling in his joints.  He complains of pain and swelling in his bilateral hands.  He also has discomfort in his shoulders and his wrist joints.  He complains of discomfort in his knee joints and his feet as well.  He states his toes gets numb at times.  She states that he went to the emergency room due to severe shoulder joint pain.  He had x-ray of his shoulder joint and was placed on prednisone taper for 10 days.  He took first dose of prednisone yesterday and his symptoms remarkably improved.  He had lower back problems several years ago without recurrence.  He continues to have some neck discomfort without radiculopathy.  He states his hands are very stiff in the morning.  He was also seen by the cardiologist for coronary artery disease.  Activities of Daily Living:  Patient reports morning stiffness for several hours.   Patient Reports nocturnal pain.  Difficulty dressing/grooming: Reports Difficulty climbing stairs: Denies Difficulty getting out of chair: Denies Difficulty using hands for taps, buttons, cutlery, and/or writing: Reports  Review of Systems    Constitutional: Positive for fatigue. Negative for night sweats.  HENT: Negative for mouth sores, mouth dryness and nose dryness.   Eyes: Negative for redness, itching and dryness.  Respiratory: Positive for shortness of breath and difficulty breathing.   Cardiovascular: Negative for chest pain, palpitations, hypertension, irregular heartbeat and swelling in legs/feet.  Gastrointestinal: Negative for blood in stool, constipation and diarrhea.  Endocrine: Negative for increased urination.  Genitourinary: Negative for difficulty urinating and painful urination.  Musculoskeletal: Positive for arthralgias, joint pain, joint swelling and morning stiffness. Negative for myalgias, muscle weakness, muscle tenderness and myalgias.  Skin: Positive for color change. Negative for rash, hair loss, nodules/bumps, skin tightness, ulcers and sensitivity to sunlight.  Allergic/Immunologic: Negative for susceptible to infections.  Neurological: Positive for numbness. Negative for dizziness, fainting, headaches, memory loss, night sweats and weakness.  Hematological: Negative for bruising/bleeding tendency and swollen glands.  Psychiatric/Behavioral: Negative for depressed mood, confusion and sleep disturbance. The patient is not nervous/anxious.     PMFS History:  Patient Active Problem List   Diagnosis Date Noted  . Coronary artery calcification 09/26/2019  . Pulmonary fibrosis ([place]) 09/08/2019  . Myalgia due to statin 09/08/2019  . Chronic hip pain, bilateral 06/09/2017  . Chronic bilateral low back pain with bilateral sciatica 06/09/2017  . Osteoarthritis of multiple joints 05/07/2017  . Genital herpes 05/07/2017  . Screening for prostate  cancer 05/07/2017  . Hypercholesterolemia 03/30/2003  . Former smoker 02/26/1995    Past Medical History:  Diagnosis Date  . Arthritis   . COPD (chronic obstructive pulmonary disease) ([place])   . Coronary artery disease   . Dyspnea   . Emphysema lung ([place])   .  Hyperlipidemia   . Pulmonary filariasis     Family History  Problem Relation Age of Onset  . Heart disease Mother   . Heart attack Mother 38  . Arthritis Mother   . Heart disease Father   . Heart attack Father 36  . Healthy Sister   . Hyperlipidemia Brother   . Throat cancer Brother 78  . Heart disease Brother   . Healthy Son   . Healthy Daughter    Past Surgical History:  Procedure Laterality Date  . SINUS SURGERY WITH INSTATRAK     Social History   Social History Narrative  . Not on file   Immunization History  Administered Date(s) Administered  . Pneumococcal Conjugate-13 09/08/2019  . Tdap 04/19/2015, 05/07/2017     Objective: Vital Signs: BP 124/81 (BP Location: Right Arm, Patient Position: Sitting, Cuff Size: Normal)   Pulse 74   Resp 15   Ht '5\' 11"'  (1.803 m)   Wt 170 lb 9.6 oz (77.4 kg)   BMI 23.79 kg/m    Physical Exam Vitals and nursing note reviewed.  Constitutional:      Appearance: He is well-developed.  HENT:     Head: Normocephalic and atraumatic.  Eyes:     Conjunctiva/sclera: Conjunctivae normal.     Pupils: Pupils are equal, round, and reactive to light.  Cardiovascular:     Rate and Rhythm: Normal rate and regular rhythm.     Heart sounds: Normal heart sounds.  Pulmonary:     Effort: Pulmonary effort is normal.     Breath sounds: Rales present.  Abdominal:     General: Bowel sounds are normal.     Palpations: Abdomen is soft.  Musculoskeletal:     Cervical back: Normal range of motion and neck supple.  Skin:    General: Skin is warm and dry.     Capillary Refill: Capillary refill takes less than 2 seconds.  Neurological:     Mental Status: He is alert and oriented to person, place, and time.  Psychiatric:        Behavior: Behavior normal.      Musculoskeletal Exam: C-spine was in good range of motion.  Shoulder joints with good range of motion without discomfort.  Elbow joints with good range of motion.  He has tenderness over  MCPs and synovitis of her left second MCP joint.  Hip joints and knee joints in good range of motion with no synovitis.  Ankle joints in good range of motion.  He has some tenderness across MTP joints.  CDAI Exam: CDAI Score: 5.2  Patient Global: 6 mm; Provider Global: 6 mm Swollen: 1 ; Tender: 8  Joint Exam 10/21/2019      Right  Left  Glenohumeral   Tender   Tender  MCP 2     Swollen Tender  Cervical Spine   Tender     MTP 2   Tender     MTP 3   Tender   Tender  MTP 5      Tender     Investigation: No additional findings.  Imaging: DG Elbow Complete Left  Result Date: 10/09/2019 CLINICAL DATA:  Left elbow pain with pronation and  supination, nontraumatic. EXAM: LEFT ELBOW - COMPLETE 3+ VIEW COMPARISON:  None. FINDINGS: There is no evidence of fracture, dislocation, or joint effusion. There is no evidence of arthropathy or other focal bone abnormality. Soft tissues are unremarkable. IMPRESSION: No acute osseous abnormality. No significant left elbow arthropathy. Electronically Signed   By: Ilona Sorrel M.D.   On: 10/09/2019 09:46   NM Myocar Multi W/Spect W/Wall Motion / EF  Result Date: 10/14/2019 Pharmacological myocardial perfusion imaging study with no significant  ischemia Normal wall motion, EF estimated at 54% Mild GI uptake artifact No EKG changes concerning for ischemia at peak stress or in recovery. Resting EKG with T wave inversions V1 through V5 Moderate three-vessel coronary calcification, no significant aortic atherosclerosis Low risk scan Signed, Esmond Plants, MD, Ph.D [facility]   DG Shoulder Left  Result Date: 10/09/2019 CLINICAL DATA:  Chronic left shoulder pain for 1-2 years. No recent injury but worsening symptoms EXAM: LEFT SHOULDER - 2+ VIEW COMPARISON:  None. FINDINGS: There is no evidence of fracture or dislocation. There is no evidence of arthropathy or other focal bone abnormality. Soft tissues are unremarkable. Interstitial coarsening in the lung that  correlates with emphysema and fibrosis by recent chest CT. IMPRESSION: Negative left shoulder. Electronically Signed   By: Monte Fantasia M.D.   On: 10/09/2019 09:14   XR Foot 2 Views Left  Result Date: 10/21/2019 First MTP, PIP and DIP narrowing was noted.  None of the other MTP joint space narrowing was noted.  No intertarsal tibiotalar joint space narrowing was noted.  No erosive changes were noted. Impression: These findings are consistent with osteoarthritis of the foot.  XR Foot 2 Views Right  Result Date: 10/21/2019 First MTP, PIP and DIP narrowing was noted.  None of the other MTP joint space narrowing was noted.  No intertarsal tibiotalar joint space narrowing was noted.  No erosive changes were noted. Impression: These findings are consistent with osteoarthritis of the foot.  XR Hand 2 View Left  Result Date: 10/21/2019 CMC, PIP and DIP narrowing was noted.  No MCP, intercarpal radiocarpal joint space narrowing was noted.  Juxta-articular osteopenia was noted.  No erosive changes were noted. Impression: These findings are consistent with inflammatory arthritis and osteoarthritis overlap.  XR Hand 2 View Right  Result Date: 10/21/2019 Mild first and second MCP joint narrowing was noted.  PIP narrowing was noted.  No intercarpal radiocarpal joint space narrowing was noted.  No erosive changes were noted. Impression: These findings are consistent with osteoarthritis or inflammatory arthritis overlap.  XR KNEE 3 VIEW LEFT  Result Date: 10/21/2019 No medial lateral compartment narrowing was noted.  No chondrocalcinosis was noted.  No patellofemoral narrowing was noted. Impression: Unremarkable x-ray of the knee joint.  XR KNEE 3 VIEW RIGHT  Result Date: 10/21/2019 Mild medial compartment narrowing was noted.  Mild patellofemoral narrowing was noted.  No chondrocalcinosis was noted. Impression: These findings are consistent mild osteoarthritis and mild chondromalacia patella.   Recent  Labs: Lab Results  Component Value Date   WBC 11.2 (H) 09/10/2019   HGB 16.5 09/10/2019   PLT 347 09/10/2019   NA 137 09/10/2019   K 4.2 09/10/2019   CL 103 09/10/2019   CO2 23 09/10/2019   GLUCOSE 91 09/10/2019   BUN 28 (H) 09/10/2019   CREATININE 0.77 09/10/2019   BILITOT 0.9 09/10/2019   ALKPHOS 112 09/10/2019   AST 22 09/10/2019   ALT 16 09/10/2019   PROT 8.3 (H) 09/10/2019  ALBUMIN 3.8 09/10/2019   CALCIUM 9.2 09/10/2019   GFRAA >60 09/10/2019    Speciality Comments: No specialty comments available.  Procedures:  No procedures performed Allergies: Atorvastatin   Assessment / Plan:     Visit Diagnoses: Rheumatoid arthritis involving multiple sites with positive rheumatoid factor (HCC)-patient gives history of inflammatory arthritis for almost a year now.  He also has positive rheumatoid factor negative CCP and positive Ro antibody.  He states he has pain and swelling in his bilateral hands and also discomfort in his feet.  He was having severe pain and discomfort in the shoulder joints.  He was placed on prednisone and did much better after being on prednisone.  Today he did not have much synovitis except for his left second MCP joint swelling.  With his history of interstitial lung disease he would benefit by going on Imuran.  I will obtain some baseline labs today.  Once the labs are back then we can start him on Imuran 50 mg and increase to 100 mg p.o. daily as tolerated after lab work.  He will need labs in 2 weeks x 2 and then every 2 months until stable.  We will give him a prednisone taper starting at 10 mg p.o. daily and taper by 2.5 mg every 2 weeks.  High risk prescription-we will need some baseline labs today.  Polyarthralgia - 09/10/19: CK 97, aldolase 5.8, Ro 2.6, La-, Scl-70-, RF 490.7, dsDNA 3, Ace 28, ESR 17, CCP 15.  He has positive Ro antibody.  He gives history of sicca symptoms mostly related to medications and also Lasix surgery.  Pain in both hands -he  complains of pain and discomfort in his bilateral hands.  He had tenderness on palpation over MCPs and synovitis over left second MCP joint.  Plan: XR Hand 2 View Right, XR Hand 2 View Left  Chronic pain of both knees -he complains of pain and discomfort in her bilateral knee joints.  Plan: XR KNEE 3 VIEW RIGHT, XR KNEE 3 VIEW LEFT  Pain in both feet -he has tenderness across MTPs with no synovitis.  Plan: XR Foot 2 Views Right, XR Foot 2 Views Left   ILD (interstitial lung disease) (HCC)-followed by Dr. Chase Caller.  He was started on antifibrotic agent.  Pulmonary fibrosis ([place])  Former smoker  Coronary artery calcification-followed by cardiology.  Chronic bilateral low back pain with bilateral sciatica-patient denies any discomfort currently.  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 XR Hand 2 View Right  . XR Hand 2 View Left  . XR Foot 2 Views Right  . XR Foot 2 Views Left  . XR KNEE 3 VIEW RIGHT  . XR KNEE 3 VIEW LEFT  . Urinalysis, Routine w reflex microscopic  . ANA  . Anti-Smith antibody  . 14-3-3 eta Protein  . Hepatitis B core antibody, IgM  . Hepatitis B surface antigen  . QuantiFERON-TB Gold Plus  . Serum protein electrophoresis with reflex  . IgG, IgA, IgM  . Thiopurine methyltransferase(tpmt)rbc   Meds ordered this encounter  Medications  . predniSONE (DELTASONE) 5 MG tablet    Sig: Take 2 tablets (10 mg total) by mouth daily with breakfast for 14 days, THEN 1.5 tablets (7.5 mg total) daily with breakfast for 14 days, THEN 1 tablet (5 mg total) daily with breakfast for 14 days, THEN 0.5 tablets (2.5 mg total)  daily with breakfast for 14 days.    Dispense:  70 tablet    Refill:  0    Face-to-face time spent with patient was 50 minutes. Greater than 50% of time was spent in counseling and coordination of care.  Follow-Up Instructions:  Return in about 2 weeks (around 11/04/2019).   Bo Merino, MD  Note - This record has been created using Editor, commissioning.  Chart creation errors have been sought, but may not always  have been located. Such creation errors do not reflect on  the standard of medical care.

## 2019-10-21 ENCOUNTER — Ambulatory Visit: Payer: Self-pay

## 2019-10-21 ENCOUNTER — Encounter: Payer: Self-pay | Admitting: Rheumatology

## 2019-10-21 ENCOUNTER — Other Ambulatory Visit: Payer: Self-pay

## 2019-10-21 ENCOUNTER — Ambulatory Visit (INDEPENDENT_AMBULATORY_CARE_PROVIDER_SITE_OTHER): Payer: PPO | Admitting: Rheumatology

## 2019-10-21 VITALS — BP 124/81 | HR 74 | Resp 15 | Ht 71.0 in | Wt 170.6 lb

## 2019-10-21 DIAGNOSIS — M0579 Rheumatoid arthritis with rheumatoid factor of multiple sites without organ or systems involvement: Secondary | ICD-10-CM | POA: Diagnosis not present

## 2019-10-21 DIAGNOSIS — M791 Myalgia, unspecified site: Secondary | ICD-10-CM | POA: Diagnosis not present

## 2019-10-21 DIAGNOSIS — M5441 Lumbago with sciatica, right side: Secondary | ICD-10-CM

## 2019-10-21 DIAGNOSIS — G8929 Other chronic pain: Secondary | ICD-10-CM

## 2019-10-21 DIAGNOSIS — M2550 Pain in unspecified joint: Secondary | ICD-10-CM

## 2019-10-21 DIAGNOSIS — Z79899 Other long term (current) drug therapy: Secondary | ICD-10-CM | POA: Diagnosis not present

## 2019-10-21 DIAGNOSIS — T466X5A Adverse effect of antihyperlipidemic and antiarteriosclerotic drugs, initial encounter: Secondary | ICD-10-CM

## 2019-10-21 DIAGNOSIS — J849 Interstitial pulmonary disease, unspecified: Secondary | ICD-10-CM | POA: Diagnosis not present

## 2019-10-21 DIAGNOSIS — E7800 Pure hypercholesterolemia, unspecified: Secondary | ICD-10-CM

## 2019-10-21 DIAGNOSIS — J8410 Pulmonary fibrosis, unspecified: Secondary | ICD-10-CM

## 2019-10-21 DIAGNOSIS — M25561 Pain in right knee: Secondary | ICD-10-CM | POA: Diagnosis not present

## 2019-10-21 DIAGNOSIS — M79672 Pain in left foot: Secondary | ICD-10-CM

## 2019-10-21 DIAGNOSIS — M79641 Pain in right hand: Secondary | ICD-10-CM

## 2019-10-21 DIAGNOSIS — M79642 Pain in left hand: Secondary | ICD-10-CM | POA: Diagnosis not present

## 2019-10-21 DIAGNOSIS — M25562 Pain in left knee: Secondary | ICD-10-CM

## 2019-10-21 DIAGNOSIS — I2584 Coronary atherosclerosis due to calcified coronary lesion: Secondary | ICD-10-CM

## 2019-10-21 DIAGNOSIS — I2510 Atherosclerotic heart disease of native coronary artery without angina pectoris: Secondary | ICD-10-CM | POA: Diagnosis not present

## 2019-10-21 DIAGNOSIS — M79671 Pain in right foot: Secondary | ICD-10-CM | POA: Diagnosis not present

## 2019-10-21 DIAGNOSIS — Z87891 Personal history of nicotine dependence: Secondary | ICD-10-CM

## 2019-10-21 DIAGNOSIS — A6002 Herpesviral infection of other male genital organs: Secondary | ICD-10-CM

## 2019-10-21 DIAGNOSIS — M5442 Lumbago with sciatica, left side: Secondary | ICD-10-CM | POA: Diagnosis not present

## 2019-10-21 MED ORDER — PREDNISONE 5 MG PO TABS: ORAL_TABLET | ORAL | 0 refills | Status: AC

## 2019-10-21 NOTE — Progress Notes (Signed)
Pharmacy Note  [phone]  Lawrence Wells  Subjective: Patient presents today to [facility] for follow up office visit.  Patient seen by the pharmacist for counseling on azathioprine (Imuran) for rheumatoid arthritis.  Patient also connected with wife Lawrence Wells on speaker phone. He is naive to [facility] and biologic therapy.  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 Chase Caller.    Objective: CMP     Component Value Date/Time   NA 137 09/10/2019 1258   K 4.2 09/10/2019 1258   CL 103 09/10/2019 1258   CO2 23 09/10/2019 1258   GLUCOSE 91 09/10/2019 1258   BUN 28 (H) 09/10/2019 1258   CREATININE 0.77 09/10/2019 1258   CREATININE 0.92 09/02/2019 0836   CALCIUM 9.2 09/10/2019 1258   PROT 8.3 (H) 09/10/2019 1258   ALBUMIN 3.8 09/10/2019 1258   AST 22 09/10/2019 1258   ALT 16 09/10/2019 1258   ALKPHOS 112 09/10/2019 1258   BILITOT 0.9 09/10/2019 1258   GFRNONAA >60 09/10/2019 1258   GFRNONAA 87 09/02/2019 0836   GFRAA >60 09/10/2019 1258   GFRAA 101 09/02/2019 0836    CBC    Component Value Date/Time   WBC 11.2 (H) 09/10/2019 1258   RBC 5.26 09/10/2019 1258   HGB 16.5 09/10/2019 1258   HCT 49.5 09/10/2019 1258   PLT 347 09/10/2019 1258   MCV 94.1 09/10/2019 1258   MCH 31.4 09/10/2019 1258   MCHC 33.3 09/10/2019 1258   RDW 12.4 09/10/2019 1258   LYMPHSABS 3.9 09/10/2019 1258   MONOABS 0.9 09/10/2019 1258   EOSABS 0.2 09/10/2019 1258   BASOSABS 0.1 09/10/2019 1258    Baseline Immunosuppressant Therapy Labs TB GOLD Pending 10/21/2019  Hepatitis Panel HepB pending 10/21/2019 Hepatitis Latest Ref Rng & Units 05/07/2017  Hep C Ab NON-REACTI NON-REACTIVE  Hep C Ab NON-REACTI NON-REACTIVE   HIV Lab Results  Component Value Date   HIV NON-REACTIVE 05/07/2017   Immunoglobulins Pending 10/21/2019  SPEP Pending 10/21/2019  G6PD No results found for: G6PDH  TPMT Pending 10/21/2019   Chest x-ray 09/08/2019 Diffuse fibrotic changes with questionable nodule in the left  apex. Noncontrast CT is recommended for further evaluation.  Assessment/Plan:  Patient was counseled on the purpose, proper use, and adverse effects of azathioprine including risk of infection, nausea, rash, and hair loss. Also informed that medication can cause discoloration of urine, sweat and tears. Reviewed risk of cancer after long term use.  Discussed risk of myelosupression and reviewed importance of frequent lab work to monitor blood counts.  Standing orders placed.  Reviewed drug-drug interactions including contraindication with allopurinol.  Provided patient with educational materials on azathioprine and answered all questions.  Patient consented to azathioprine.  Will upload consent into the media tab.   Patient dose will be Imuran 50 mg 1 tablet daily for 2 weeks, then increase to 2 tablets daily as tolerated.  Prescription pending labs results.  Patient is currently on a prednisone taper.  We would like patient to continue prednisone after he finishes current taper to manage his symptoms while he initiates Imuran.  Prescription sent to [facility] per patient request for Prednisone 10 mg tapering by 2.5 mg every 2 weeks.    All questions encouraged and answered.  Instructed patient to call with any other questions or concerns.  [name], PharmD, [place], [place] Clinical Specialty Pharmacist [phone]  10/21/2019 9:39 AM

## 2019-10-21 NOTE — Patient Instructions (Addendum)
CONTINUE current prednisone taper  START new prednisone taper after completing taper from Dr. Tamala Julian following directions below.  10 mg (2 tablets) daily for 14 days  7.5 mg (1 1/2 tablets) daily for 14 days   5 mg (1 tablet) daily for 14 days  2.5 mg (1/2 tablet) daily for 14 days  We will start Imuran once we receive your lab results  You do NOT have to stop Imuran to receive the Covid-19 vaccine.    Standing Labs We placed an order today for your standing lab work.    Please come back and get your standing labs after starting Imuran in 2 weeks, 4 weeks, and then every 3 months.  We have open lab daily Monday through Thursday from 8:30-12:30 PM and 1:30-4:30 PM and Friday from 8:30-12:30 PM and 1:30-4:00 PM at the office of Dr. Bo Merino.   You may experience shorter wait times on Monday and Friday afternoons. The office is located at [address] [place], [place], [place] [address] No appointment is necessary.   Labs are drawn by [facility].  You may receive a bill from [facility] for your lab work.  If you wish to have your labs drawn at another location, please call the office 24 hours in advance to send orders.  If you have any questions regarding directions or hours of operation,  please call [phone].   Just as a reminder please drink plenty of water prior to coming for your lab work. Thanks!  Vaccines You are taking a medication(s) that can suppress your immune system.  The following immunizations are recommended: . Flu annually . Covid-19 . Pneumonia (Pneumovax 23 and Prevnar 13 spaced at least 1 year apart) . Shingrix (after age 14)  Please check with your PCP to make sure you are up to date.  Azathioprine tablets What is this medicine? AZATHIOPRINE (ay za THYE oh preen) suppresses the immune system. It is used to prevent organ rejection after a transplant. It is also used to treat rheumatoid arthritis. This medicine may be used for other purposes;  ask your health care provider or pharmacist if you have questions. COMMON BRAND NAME(S): Azasan, Imuran What should I tell my health care provider before I take this medicine? They need to know if you have any of these conditions:  infection  kidney disease  liver disease  an unusual or allergic reaction to azathioprine, other medicines, lactose, foods, dyes, or preservatives  pregnant or trying to get pregnant  breast feeding How should I use this medicine? Take this medicine by mouth with a full glass of water. Follow the directions on the prescription label. Take your medicine at regular intervals. Do not take your medicine more often than directed. Continue to take your medicine even if you feel better. Do not stop taking except on your doctor's advice. Talk to your pediatrician regarding the use of this medicine in children. Special care may be needed. Overdosage: If you think you have taken too much of this medicine contact a poison control center or emergency room at once. NOTE: This medicine is only for you. Do not share this medicine with others. What if I miss a dose? If you miss a dose, take it as soon as you can. If it is almost time for your next dose, take only that dose. Do not take double or extra doses. What may interact with this medicine? Do not take this medicine with any of the following medications:  febuxostat  mercaptopurine  This medicine may also interact with the following medications:  allopurinol  aminosalicylates like sulfasalazine, mesalamine, balsalazide, and olsalazine  leflunomide  medicines called ACE inhibitors like benazepril, captopril, enalapril, fosinopril, quinapril, lisinopril, ramipril, and trandolapril  mycophenolate  sulfamethoxazole; trimethoprim  vaccines  warfarin This list may not describe all possible interactions. Give your health care provider a list of all the medicines, herbs, non-prescription drugs, or dietary  supplements you use. Also tell them if you smoke, drink alcohol, or use illegal drugs. Some items may interact with your medicine. What should I watch for while using this medicine? Visit your doctor or health care professional for regular checks on your progress. You will need frequent blood checks during the first few months you are receiving the medicine. If you get a cold or other infection while receiving this medicine, call your doctor or health care professional. Do not treat yourself. The medicine may increase your risk of getting an infection. Women should inform their doctor if they wish to become pregnant or think they might be pregnant. There is a potential for serious side effects to an unborn child. Talk to your health care professional or pharmacist for more information. Men may have a reduced sperm count while they are taking this medicine. Talk to your health care professional for more information. This medicine may increase your risk of getting certain kinds of cancer. Talk to your doctor about healthy lifestyle choices, important screenings, and your risk. What side effects may I notice from receiving this medicine? Side effects that you should report to your doctor or health care professional as soon as possible:  allergic reactions like skin rash, itching or hives, swelling of the face, lips, or tongue  changes in vision  confusion  fever, chills, or any other sign of infection  loss of balance or coordination  severe stomach pain  unusual bleeding, bruising  unusually weak or tired  vomiting  yellowing of the eyes or skin Side effects that usually do not require medical attention (report to your doctor or health care professional if they continue or are bothersome):  hair loss  nausea This list may not describe all possible side effects. Call your doctor for medical advice about side effects. You may report side effects to FDA at 1-800-FDA-1088. Where should I  keep my medicine? Keep out of the reach of children. Store at room temperature between 15 and 25 degrees C (59 and 77 degrees F). Protect from light. Throw away any unused medicine after the expiration date. NOTE: This sheet is a summary. It may not cover all possible information. If you have questions about this medicine, talk to your doctor, pharmacist, or health care provider.  2020 Elsevier/Gold Standard (2013-11-30 12:00:31)

## 2019-10-22 ENCOUNTER — Other Ambulatory Visit
Admission: RE | Admit: 2019-10-22 | Discharge: 2019-10-22 | Disposition: A | Discharge status: Home / Self Care | Payer: PPO | Source: Ambulatory Visit | Attending: Internal Medicine | Admitting: Internal Medicine

## 2019-10-22 ENCOUNTER — Encounter: Payer: Self-pay | Admitting: Internal Medicine

## 2019-10-22 ENCOUNTER — Ambulatory Visit (INDEPENDENT_AMBULATORY_CARE_PROVIDER_SITE_OTHER): Payer: PPO | Admitting: Internal Medicine

## 2019-10-22 ENCOUNTER — Other Ambulatory Visit: Payer: Self-pay

## 2019-10-22 VITALS — BP 130/82 | HR 60 | Temp 98.2°F | Ht 71.0 in | Wt 173.4 lb

## 2019-10-22 DIAGNOSIS — J439 Emphysema, unspecified: Secondary | ICD-10-CM

## 2019-10-22 DIAGNOSIS — R768 Other specified abnormal immunological findings in serum: Secondary | ICD-10-CM

## 2019-10-22 DIAGNOSIS — M359 Systemic involvement of connective tissue, unspecified: Secondary | ICD-10-CM | POA: Diagnosis not present

## 2019-10-22 DIAGNOSIS — J8489 Other specified interstitial pulmonary diseases: Secondary | ICD-10-CM | POA: Diagnosis not present

## 2019-10-22 DIAGNOSIS — Z5181 Encounter for therapeutic drug level monitoring: Secondary | ICD-10-CM | POA: Diagnosis not present

## 2019-10-22 DIAGNOSIS — J849 Interstitial pulmonary disease, unspecified: Secondary | ICD-10-CM

## 2019-10-22 LAB — HEPATIC FUNCTION PANEL
ALT: 19 U/L (ref 0–44)
AST: 22 U/L (ref 15–41)
Albumin: 4.1 g/dL (ref 3.5–5.0)
Alkaline Phosphatase: 104 U/L (ref 38–126)
Bilirubin, Direct: <0.1 mg/dL (ref 0.0–0.2)
Total Bilirubin: 0.6 mg/dL (ref 0.3–1.2)
Total Protein: 8.4 g/dL — ABNORMAL HIGH (ref 6.5–8.1)

## 2019-10-22 MED ORDER — EZETIMIBE 10 MG PO TABS: 10.0000 mg | ORAL_TABLET | Freq: Every day | ORAL | 3 refills | Status: DC

## 2019-10-22 NOTE — Patient Instructions (Addendum)
ICD-10-CM   1. Interstitial lung disease due to connective tissue disease ([name])  J84.89    M35.9   2. ILD (interstitial lung disease) ([name])  J84.9   3. Rheumatoid factor positive  R76.8   4. Therapeutic drug monitoring  Z51.81   5. Pulmonary emphysema, unspecified emphysema type (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 Keturah Barre gives you the greenlight for this -Other management steps  -Support group  -Visit website www.pulmonaryfibrosis.org for support group  -Email [name] at [email or web address] .com for local support group information  -Pulmonary rehabilitation  -Refer to pulmonary rehabilitation at [facility]  -Transplant  -I want you to think about this and at next visit we will talk about referral  -Research as a care option  =-This is a consideration for the future  Follow-up  -In 4 and 8 and 12 weeks do repeat liver function test -In 4 weeks do a televisit or video visit with myself or nurse practitioner -In 8 weeks do another televisit or video visit with myself or nurse practitioner -In 12 weeks do spirometry and DLCO -In 12 weeks to a 30-minute visit with myself Dr. Chase Caller  -Try to make as many of these appointments today as possible

## 2019-10-22 NOTE — Addendum Note (Signed)
Addended by: Clearence Ped R on: 10/22/2019 11:06 AM   Modules accepted: Orders

## 2019-10-22 NOTE — Progress Notes (Signed)
OV 09/10/2019  Subjective:  Patient ID: Lawrence Wells, male , DOB: October 17, 1955 , age 89 y.o. , MRN: [id number] , ADDRESS: [place] [id number]   09/10/2019 -   Chief Complaint  Patient presents with  . pulmonary consult    per Dr. Parks Ranger- CXR 09/08/2019. pt reports of sob with exertion, talking and bending, prod cough with yellow mucus, left sided chest discomfort  and wheezing.     HPI Lawrence Wells 64 y.o. -referred for interstitial lung disease after a chest x-ray from September 08, 2019 that I personally visualized and interpreted shows diffuse ILD along with left upper lobe nodule.  Patient has wife tell me that he has had insidious onset of shortness of breath for a year with progression in the last few months.  They also tell me that few years ago he had an chest x-ray done for an incidental unrelated reason that showed chronic changes of scarring but apparently were reassured.  In review of his chart and noticed that in 2005 this is chest x-ray done in the [facility] system with reports of ILD in it.  He is unaware of that.  History for this visit is given by the patient and his wife and review of the chart. [name]  Symptoms:   Insidious onset of shortness of breath for the last year getting worse in the last 3 to 4 months.  Definitely no dyspnea a few years ago even though chest x-ray from 2005 was reported as ILD.  Sometimes dyspnea is episodic but mostly with exertion relieved by rest.  Symptom severity is listed below.  There is associated significant cough.  Cough started in October 2020.  It is getting worse cough is rated as moderate to severe.  He coughs at night.  He does bring up some yellow phlegm.  It is worse when he lies down.Marland Kitchen  He does clear his throat and he does feel a tickle in the back of the throat.  There is also associated wheezing.     SYMPTOM SCALE - ILD 09/10/2019   O2 use ra  Shortness  of Breath 0 -> 5 scale with 5 being worst (score 6 If unable to do)  At rest 0  Simple tasks - showers, clothes change, eating, shaving *4  Household (dishes, doing bed, laundry) 4*  Shopping 3  Walking level at own pace 3  Walking keeping up with others of same age 56  Walking up Stairs 4  Walking up [place] 4  Total (40 - 48) Dyspnea Score 24  How bad is your cough? yes  How bad is your fatigue yes       Past Medical History : He gives a history positive for arthritis.  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  Denies any asthma or known COPD.  Denies heart failure.  Denies scleroderma any collagen vascular disease.  Denies diagnosis of acid reflux or hiatal hernia.  Denies obstructive sleep apnea.  Denies pulmonary hypertension.  Denies diabetes or thyroid disease or stroke or seizures.  Denies infectious mononucleosis.  Denies hepatitis.  Denies tuberculosis denies kidney disease.  Denies blood clots denies heart disease denies pleurisy.   ROS: Positive for fatigue and arthralgia.  Positive for acid reflux.  He went to check the box for [name].  Denies any GI  symptoms or rash or ulcers  FAMILY HISTORY of LUNG DISEASE: Denies including COPD and pulmonary fibrosis   EXPOSURE HISTORY: Smokes cigarettes between 1983 and 2007 1 to 2 packs/day and then quit.  Did not smoke cigars did not smoke pipe.  Did not vape.  Never smoked marijuana or cocaine.  Never used intravenous drugs.   HOME and HOBBY DETAILS : [place].  The age of the home is 20 years.  There is no dampness in the living environment.  No mold or mildew.  Does not use humidifier.  No CPAP use.  Does not use nebulizer.  No steam iron use.  No Jacuzzi.  No pet birds or parakeets in the house.  No pet gerbils.  No feather pillows.  No mold in the [facility] duct.   Does not play wind instruments.  Does not do any gardening.   OCCUPATIONAL HISTORY (122 questions) : Works in the Transport planner for 30 years.  Worked in [employer] and crawl spaces.  A lot of dust exposure.  During this time he worked in damp air-conditioned spaces.  He thinks he had asbestos exposure.  He also worked in the [employer].  He cleaned AC coils.  Otherwise history is negative   PULMONARY TOXICITY HISTORY (27 items): Entirely negative.       ROS - per HPI    OV 10/22/2019  Subjective:  Patient ID: Lawrence Wells, male , DOB: 23-Feb-1956 , age 44 y.o. , MRN: [id number] , ADDRESS: [place] [id number]   10/22/2019 -   Chief Complaint  Patient presents with  . Follow-up    ILD Followup. SOB on exertion. Non prod cough. Pt denies any wheezing, fever, chills, or sweats.   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  Started on nintedanib 10/12/2019  Associated emphysema present on CT scan January 2021  Normal cardiac stress test 10/14/2019   HPI ANQUAN Wells 64 y.o. -presents for follow-up with his wife.  At last saw him end of January 2021 for ILD evaluation.  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  He then saw a rheumatologist Dr. D yesterday 10/21/2019 and given a diagnosis of joint rheumatoid arthritis.  In the interim he also saw pulmonary nurse practitioner for a virtual visit and was started on nintedanib but she started [place] on 10/12/2019.  So far is tolerating it fine.  He is using oxygen only with exertion.  There is no deterioration in symptoms.  Yesterday the rheumatology visit  Imuran has been recommended first rheumatoid arthritis.  The TPMT test has been done and the results are pending.  He does not have liver function tests checked after he started his nintedanib.  He and his wife have many questions about the future course of the disease and natural history and other management strategies.  Currently is free was not helping him but he is willing to take it because of the associated emphysema.   I personally visualized and reviewed the CT and also interpreted the findings myself   Simple office walk 185 feet x  3 laps goal  with forehead probe 09/10/2019  10/22/2019   O2 used ra ra - walk, uses 2L portable at home with exerion  Number laps completed 3 3  Comments about pace mod   Resting Pulse Ox/HR 98% and 74/min 95% and 71  Final Pulse Ox/HR 88% and 96/min 86% and 101  Desaturated </= 88% yes yes  Desaturated <= 3% points Yes, 10 points Yes, 9 pon  Got Tachycardic >/= 90/min yes yes  Symptoms at end of test Yes "pretty heavy" yes  Miscellaneous comments Corrected 2L      ROS - per HPI     has a past medical history of Arthritis, COPD (chronic obstructive pulmonary disease) ([place]), Coronary artery disease, Dyspnea, Emphysema lung ([place]), Hyperlipidemia, and Pulmonary filariasis.   reports that he quit smoking about 14 years ago. His smoking use included cigarettes. He has a 45.00 pack-year smoking history. He quit smokeless tobacco use about 14 years ago.  His smokeless tobacco use included chew.  Past Surgical History:  Procedure Laterality Date  . SINUS SURGERY WITH INSTATRAK      Allergies  Allergen Reactions  . Atorvastatin Other (See Comments)    Myalgias    Immunization History  Administered Date(s) Administered  . Pneumococcal Conjugate-13 09/08/2019  . Tdap 04/19/2015, 05/07/2017    Family History  Problem Relation Age of Onset  . Heart disease Mother   . Heart attack Mother 87  . Arthritis Mother   . Heart disease Father   .  Heart attack Father 63  . Healthy Sister   . Hyperlipidemia Brother   . Throat cancer Brother 56  . Heart disease Brother   . Healthy Son   . Healthy Daughter      Current Outpatient Medications:  .  albuterol (VENTOLIN HFA) 108 (90 Base) MCG/ACT inhaler, Inhale 2 puffs into the lungs every 4 (four) hours as needed for wheezing or shortness of breath (cough)., Disp: 8 g, Rfl: 2 .  diclofenac Sodium (VOLTAREN) 1 % GEL, Apply 2 g topically 3 (three) times daily as needed., Disp: 100 g, Rfl: 2 .  gabapentin (NEURONTIN) 100 MG capsule, Start 1 capsule daily, increase by 1 cap every 2-3 days as tolerated up to 3 times a day, or may take 3 at once in evening., Disp: 90 capsule, Rfl: 3 .  methylPREDNISolone (MEDROL DOSEPAK) 4 MG TBPK tablet, Take Tapered dose as directed, Disp: 21 tablet, Rfl: 0 .  Multiple Vitamin (MULTIVITAMIN) tablet, Take 1 tablet by mouth daily., Disp: , Rfl:  .  naproxen (NAPROSYN) 500 MG tablet, Take 1 tablet (500 mg total) by mouth 2 (two) times daily with a meal. For 2-4 weeks then as needed, Disp: 60 tablet, Rfl: 2 .  Nintedanib (OFEV) 150 MG CAPS, Take 1 capsule (150 mg total) by mouth 2 (two) times daily., Disp: 60 capsule, Rfl: 2 .  OXYGEN, Inhale into the lungs as needed., Disp: , Rfl:  .  predniSONE (DELTASONE) 5 MG tablet, Take 2 tablets (10 mg total) by mouth daily with breakfast for 14 days, THEN 1.5 tablets (7.5 mg total) daily with breakfast for 14 days, THEN 1 tablet (5 mg total) daily with breakfast for 14 days, THEN 0.5 tablets (2.5 mg total) daily with breakfast for 14 days., Disp: 70 tablet, Rfl: 0 .  Tiotropium Bromide Monohydrate (SPIRIVA RESPIMAT) 2.5 MCG/ACT AERS, Inhale 2 puffs into the lungs daily., Disp: 1 g, Rfl: 5      Objective:   Vitals:   10/22/19 1019  BP: 130/82  Pulse: 60  Temp: 98.2 F (36.8 C)  TempSrc: Temporal  SpO2: 95%  Weight: 173 lb 6.4 oz (78.7 kg)  Height: 5\' 11"  (1.803 m)    Estimated body mass index is 24.18 kg/m as  calculated from the following:   Height as of this encounter: 5\' 11"  (1.803 m).   Weight as of this encounter: 173 lb 6.4 oz (78.7 kg).  @WEIGHTCHANGE @  [employer]   10/22/19 1019  Weight: 173 lb 6.4 oz (78.7 kg)     Physical Exam  Well-built male thin.  Requiring oxygen with exertion bilateral crackles present bilateral clubbing present of his fingers.  No stigmata of connective tissue disease alert and oriented x3 normal heart sounds.  Abdomen soft.    Assessment:       ICD-10-CM   1. Interstitial lung disease due to connective tissue disease ([place])  J84.89    M35.9   2. ILD (interstitial lung disease) ([place])  J84.9   3. Rheumatoid factor positive  R76.8   4. Therapeutic drug monitoring  Z51.81   5. Pulmonary emphysema, unspecified emphysema type ([place])  J43.9        Plan:     Patient Instructions     ICD-10-CM   1. Interstitial lung disease due to connective tissue disease ([place])  J84.89    M35.9   2. ILD (interstitial lung disease) ([place])  J84.9   3. Rheumatoid factor positive  R76.8   4. Therapeutic drug monitoring  Z51.81   5. 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 Keturah Barre gives you the greenlight for this -Other management steps  -Support group  -Visit website www.pulmonaryfibrosis.org for support group  -Email [name] at [email or web address] .com for local support group information  -Pulmonary rehabilitation  -Refer to pulmonary  rehabilitation at [facility]  -Transplant  -I want you to think about this and at next visit we will talk about referral  -Research as a care option  =-This is a consideration for the future  Follow-up  -In 4 and 8 and 12 weeks do repeat liver function test -In 4 weeks do a televisit or video visit with myself or nurse practitioner -In 8 weeks do another televisit or video visit with myself or nurse practitioner -In 12 weeks do spirometry and DLCO -In 12 weeks to a 30-minute visit with myself Dr. Chase Caller  -Try to make as many of these appointments today as possible    ( Level 05 visit: Estb 40-54 min   in  visit type: on-site physical face to visit  in total care time and counseling or/and coordination of care by this undersigned MD - Dr Brand Males. This includes one or more of the following on this same day 10/22/2019: pre-charting, chart review, note writing, documentation discussion of test results, diagnostic or treatment recommendations, prognosis, risks and benefits of management options, instructions, education, compliance or risk-factor reduction. It excludes time spent by the [place] or office staff in the care of the patient. Actual time 40 min)  SIGNATURE    Dr. Brand Males, M.D., F.C.C.P,  Pulmonary and Critical Care Medicine Staff Physician, [place] Director - Interstitial Lung Disease  Program  Pulmonary [place] at [place], [place], [address]  Pager: [phone], If no answer or between  15:00h - 7:00h: call [phone] Telephone: [phone]  10:56 AM 10/22/2019

## 2019-10-22 NOTE — Telephone Encounter (Addendum)
Spoke with patient and he reports that medication was going to cost $40 at [facility] and wants me to send it to [facility]. Called pharmacy and requested that they please process with goodrx and no insurance for patient to pick up. They verbalized understanding with no further questions.

## 2019-10-22 NOTE — Telephone Encounter (Signed)
Patient calling Would like for medication to be sent to [facility] instead of [facility]

## 2019-10-22 NOTE — Telephone Encounter (Signed)
Start zetia 10 daily

## 2019-10-22 NOTE — Telephone Encounter (Signed)
Spoke with patient and reviewed that provider recommended starting zetia 10 mg once daily. Reviewed that I did send it to his pharmacy and advised if they told him anything more than $9.00 to call back because at [facility] is cheaper and would be happy to resend it to them. He verbalized understanding with no further questions at this time.

## 2019-10-22 NOTE — Addendum Note (Signed)
Addended by: Valora Corporal on: 10/22/2019 03:20 PM   Modules accepted: Orders

## 2019-10-22 NOTE — Addendum Note (Signed)
Addended by: Valora Corporal on: 10/22/2019 04:17 PM   Modules accepted: Orders

## 2019-10-27 ENCOUNTER — Encounter: Payer: Self-pay | Admitting: *Deleted

## 2019-10-27 ENCOUNTER — Encounter: Payer: PPO | Attending: Internal Medicine | Admitting: *Deleted

## 2019-10-27 ENCOUNTER — Other Ambulatory Visit: Payer: Self-pay

## 2019-10-27 DIAGNOSIS — Z7952 Long term (current) use of systemic steroids: Secondary | ICD-10-CM | POA: Insufficient documentation

## 2019-10-27 DIAGNOSIS — Z87891 Personal history of nicotine dependence: Secondary | ICD-10-CM | POA: Insufficient documentation

## 2019-10-27 DIAGNOSIS — J439 Emphysema, unspecified: Secondary | ICD-10-CM | POA: Insufficient documentation

## 2019-10-27 DIAGNOSIS — E785 Hyperlipidemia, unspecified: Secondary | ICD-10-CM | POA: Insufficient documentation

## 2019-10-27 DIAGNOSIS — I2510 Atherosclerotic heart disease of native coronary artery without angina pectoris: Secondary | ICD-10-CM | POA: Insufficient documentation

## 2019-10-27 DIAGNOSIS — Z79899 Other long term (current) drug therapy: Secondary | ICD-10-CM | POA: Insufficient documentation

## 2019-10-27 DIAGNOSIS — J849 Interstitial pulmonary disease, unspecified: Secondary | ICD-10-CM | POA: Insufficient documentation

## 2019-10-27 DIAGNOSIS — M1990 Unspecified osteoarthritis, unspecified site: Secondary | ICD-10-CM | POA: Insufficient documentation

## 2019-10-27 NOTE — Progress Notes (Signed)
Completed virtual orientation today.  EP evaluation is scheduled for Thursday  3/11 at 930am.  Documentation for diagnosis can be found in [facility] encounter 10/22/2019.

## 2019-10-28 ENCOUNTER — Encounter: Payer: PPO | Admitting: *Deleted

## 2019-10-28 ENCOUNTER — Telehealth: Payer: Self-pay | Admitting: Internal Medicine

## 2019-10-28 VITALS — Ht 70.75 in | Wt 168.9 lb

## 2019-10-28 DIAGNOSIS — J849 Interstitial pulmonary disease, unspecified: Secondary | ICD-10-CM | POA: Diagnosis not present

## 2019-10-28 DIAGNOSIS — I2510 Atherosclerotic heart disease of native coronary artery without angina pectoris: Secondary | ICD-10-CM | POA: Diagnosis not present

## 2019-10-28 DIAGNOSIS — Z87891 Personal history of nicotine dependence: Secondary | ICD-10-CM | POA: Diagnosis not present

## 2019-10-28 DIAGNOSIS — E785 Hyperlipidemia, unspecified: Secondary | ICD-10-CM | POA: Diagnosis not present

## 2019-10-28 DIAGNOSIS — J439 Emphysema, unspecified: Secondary | ICD-10-CM | POA: Diagnosis not present

## 2019-10-28 DIAGNOSIS — Z7952 Long term (current) use of systemic steroids: Secondary | ICD-10-CM | POA: Diagnosis not present

## 2019-10-28 DIAGNOSIS — M1990 Unspecified osteoarthritis, unspecified site: Secondary | ICD-10-CM | POA: Diagnosis not present

## 2019-10-28 DIAGNOSIS — Z79899 Other long term (current) drug therapy: Secondary | ICD-10-CM | POA: Diagnosis not present

## 2019-10-28 NOTE — Patient Instructions (Signed)
Patient Instructions  Patient Details  Name: Lawrence Wells MRN: [id number] Date of Birth: March 28, 1956 Referring Provider:  Brand Males, MD  Below are your personal goals for exercise, nutrition, and risk factors. Our goal is to help you stay on track towards obtaining and maintaining these goals. We will be discussing your progress on these goals with you throughout the program.  Initial Exercise Prescription: Initial Exercise Prescription - 10/28/19 1100      Date of Initial Exercise RX and Referring Provider   Date  10/28/19    Referring Provider  Brand Males MD      Oxygen   Oxygen  Continuous    Liters  2      Treadmill   MPH  2.4    Grade  0.5    Minutes  15    METs  3      NuStep   Level  3    SPM  80    Minutes  15    METs  3      Elliptical   Level  1    Speed  3    Minutes  15    METs  3      Biostep-RELP   Level  3    SPM  50    Minutes  15    METs  3      Prescription Details   Frequency (times per week)  2    Duration  Progress to 30 minutes of continuous aerobic without signs/symptoms of physical distress      Intensity   THRR 40-80% of Max Heartrate  98-138    Ratings of Perceived Exertion  11-13    Perceived Dyspnea  0-4      Progression   Progression  Continue to progress workloads to maintain intensity without signs/symptoms of physical distress.      Resistance Training   Training Prescription  Yes    Weight  5 lb    Reps  10-15       Exercise Goals: Frequency: Be able to perform aerobic exercise two to three times per week in program working toward 2-5 days per week of home exercise.  Intensity: Work with a perceived exertion of 11 (fairly light) - 15 (hard) while following your exercise prescription.  We will make changes to your prescription with you as you progress through the program.   Duration: Be able to do 30 to 45 minutes of continuous aerobic exercise in addition to a 5 minute warm-up and a 5 minute cool-down  routine.   Nutrition Goals: Your personal nutrition goals will be established when you do your nutrition analysis with the dietician.  The following are general nutrition guidelines to follow: Cholesterol < 200mg /day Sodium < 1500mg /day Fiber: Men over 50 yrs - 30 grams per day  Personal Goals: Personal Goals and Risk Factors at Admission - 10/28/19 1145      Core Components/Risk Factors/Patient Goals on Admission    Weight Management  Yes;Weight Maintenance    Intervention  Weight Management: Develop a combined nutrition and exercise program designed to reach desired caloric intake, while maintaining appropriate intake of nutrient and fiber, sodium and fats, and appropriate energy expenditure required for the weight goal.;Weight Management: Provide education and appropriate resources to help participant work on and attain dietary goals.    Admit Weight  168 lb 14.4 oz (76.6 kg)    Goal Weight: Short Term  165 lb (74.8 kg)    Goal  Weight: Long Term  165 lb (74.8 kg)    Expected Outcomes  Short Term: Continue to assess and modify interventions until short term weight is achieved;Long Term: Adherence to nutrition and physical activity/exercise program aimed toward attainment of established weight goal;Weight Maintenance: Understanding of the daily nutrition guidelines, which includes 25-35% calories from fat, 7% or less cal from saturated fats, less than 200mg  cholesterol, less than 1.5gm of sodium, & 5 or more servings of fruits and vegetables daily    Improve shortness of breath with ADL's  Yes    Intervention  Provide education, individualized exercise plan and daily activity instruction to help decrease symptoms of SOB with activities of daily living.    Expected Outcomes  Short Term: Improve cardiorespiratory fitness to achieve a reduction of symptoms when performing ADLs;Long Term: Be able to perform more ADLs without symptoms or delay the onset of symptoms    Lipids  Yes    Intervention   Provide education and support for participant on nutrition & aerobic/resistive exercise along with prescribed medications to achieve LDL 70mg , HDL >40mg .    Expected Outcomes  Short Term: Participant states understanding of desired cholesterol values and is compliant with medications prescribed. Participant is following exercise prescription and nutrition guidelines.;Long Term: Cholesterol controlled with medications as prescribed, with individualized exercise RX and with personalized nutrition plan. Value goals: LDL < 70mg , HDL > 40 mg.       Tobacco Use Initial Evaluation: Social History   Tobacco Use  Smoking Status Former Smoker  . Packs/day: 1.50  . Years: 30.00  . Pack years: 45.00  . Types: Cigarettes  . Quit date: 2007  . Years since quitting: 14.2  Smokeless Tobacco Former Systems developer  . Types: Chew  . Quit date: 2007  Tobacco Comment   Dip smokeless tobacco >20-30 years    Exercise Goals and Review: Exercise Goals    Row Name 10/28/19 1144             Exercise Goals   Increase Physical Activity  Yes       Intervention  Provide advice, education, support and counseling about physical activity/exercise needs.;Develop an individualized exercise prescription for aerobic and resistive training based on initial evaluation findings, risk stratification, comorbidities and participant's personal goals.       Expected Outcomes  Short Term: Attend rehab on a regular basis to increase amount of physical activity.;Long Term: Add in home exercise to make exercise part of routine and to increase amount of physical activity.;Long Term: Exercising regularly at least 3-5 days a week.       Increase Strength and Stamina  Yes       Intervention  Provide advice, education, support and counseling about physical activity/exercise needs.;Develop an individualized exercise prescription for aerobic and resistive training based on initial evaluation findings, risk stratification, comorbidities and  participant's personal goals.       Expected Outcomes  Short Term: Increase workloads from initial exercise prescription for resistance, speed, and METs.;Short Term: Perform resistance training exercises routinely during rehab and add in resistance training at home;Long Term: Improve cardiorespiratory fitness, muscular endurance and strength as measured by increased METs and functional capacity (6MWT)       Able to understand and use rate of perceived exertion (RPE) scale  Yes       Intervention  Provide education and explanation on how to use RPE scale       Expected Outcomes  Short Term: Able to use RPE daily in rehab  to express subjective intensity level;Long Term:  Able to use RPE to guide intensity level when exercising independently       Able to understand and use Dyspnea scale  Yes       Intervention  Provide education and explanation on how to use Dyspnea scale       Expected Outcomes  Short Term: Able to use Dyspnea scale daily in rehab to express subjective sense of shortness of breath during exertion;Long Term: Able to use Dyspnea scale to guide intensity level when exercising independently       Knowledge and understanding of Target Heart Rate Range (THRR)  Yes       Intervention  Provide education and explanation of THRR including how the numbers were predicted and where they are located for reference       Expected Outcomes  Short Term: Able to state/look up THRR;Short Term: Able to use daily as guideline for intensity in rehab;Long Term: Able to use THRR to govern intensity when exercising independently       Able to check pulse independently  Yes       Intervention  Provide education and demonstration on how to check pulse in carotid and radial arteries.;Review the importance of being able to check your own pulse for safety during independent exercise       Expected Outcomes  Short Term: Able to explain why pulse checking is important during independent exercise;Long Term: Able to check  pulse independently and accurately       Understanding of Exercise Prescription  Yes       Intervention  Provide education, explanation, and written materials on patient's individual exercise prescription       Expected Outcomes  Long Term: Able to explain home exercise prescription to exercise independently;Short Term: Able to explain program exercise prescription          Copy of goals given to participant.

## 2019-10-28 NOTE — Progress Notes (Signed)
Pulmonary Individual Treatment Plan  Patient Details  Name: Lawrence Wells MRN: [id number] Date of Birth: 11-24-1955 Referring Provider:     Pulmonary Rehab from 10/28/2019 in [facility] Cardiac and Pulmonary Rehab  Referring Provider  Brand Males MD      Initial Encounter Date:    Pulmonary Rehab from 10/28/2019 in [facility] Cardiac and Pulmonary Rehab  Date  10/28/19      Visit Diagnosis: ILD (interstitial lung disease) ([place])  Patient's Home Medications on Admission:  Current Outpatient Medications:  .  albuterol (VENTOLIN HFA) 108 (90 Base) MCG/ACT inhaler, Inhale 2 puffs into the lungs every 4 (four) hours as needed for wheezing or shortness of breath (cough). (Patient not taking: Reported on 10/27/2019), Disp: 8 g, Rfl: 2 .  diclofenac Sodium (VOLTAREN) 1 % GEL, Apply 2 g topically 3 (three) times daily as needed., Disp: 100 g, Rfl: 2 .  ezetimibe (ZETIA) 10 MG tablet, Take 1 tablet (10 mg total) by mouth daily., Disp: 90 tablet, Rfl: 3 .  gabapentin (NEURONTIN) 100 MG capsule, Start 1 capsule daily, increase by 1 cap every 2-3 days as tolerated up to 3 times a day, or may take 3 at once in evening., Disp: 90 capsule, Rfl: 3 .  methylPREDNISolone (MEDROL DOSEPAK) 4 MG TBPK tablet, Take Tapered dose as directed, Disp: 21 tablet, Rfl: 0 .  Multiple Vitamin (MULTIVITAMIN) tablet, Take 1 tablet by mouth daily., Disp: , Rfl:  .  naproxen (NAPROSYN) 500 MG tablet, Take 1 tablet (500 mg total) by mouth 2 (two) times daily with a meal. For 2-4 weeks then as needed, Disp: 60 tablet, Rfl: 2 .  Nintedanib (OFEV) 150 MG CAPS, Take 1 capsule (150 mg total) by mouth 2 (two) times daily., Disp: 60 capsule, Rfl: 2 .  OXYGEN, Inhale into the lungs as needed., Disp: , Rfl:  .  predniSONE (DELTASONE) 5 MG tablet, Take 2 tablets (10 mg total) by mouth daily with breakfast for 14 days, THEN 1.5 tablets (7.5 mg total) daily with breakfast for 14 days, THEN 1 tablet (5 mg total) daily with breakfast for 14 days,  THEN 0.5 tablets (2.5 mg total) daily with breakfast for 14 days., Disp: 70 tablet, Rfl: 0 .  Tiotropium Bromide Monohydrate (SPIRIVA RESPIMAT) 2.5 MCG/ACT AERS, Inhale 2 puffs into the lungs daily., Disp: 1 g, Rfl: 5  Past Medical History: Past Medical History:  Diagnosis Date  . Arthritis   . COPD (chronic obstructive pulmonary disease) ([place])   . Coronary artery disease   . Dyspnea   . Emphysema lung ([place])   . Hyperlipidemia   . Pulmonary filariasis     Tobacco Use: Social History   Tobacco Use  Smoking Status Former Smoker  . Packs/day: 1.50  . Years: 30.00  . Pack years: 45.00  . Types: Cigarettes  . Quit date: 2007  . Years since quitting: 14.2  Smokeless Tobacco Former Systems developer  . Types: Chew  . Quit date: 2007  Tobacco Comment   Dip smokeless tobacco >20-30 years    Labs: Recent Review Flowsheet Data    Labs for ITP Cardiac and Pulmonary Rehab Latest Ref Rng & Units 05/07/2017 09/02/2019   Cholestrol <200 mg/dL 259(H) 210(H)   LDLCALC mg/dL (calc) 156(H) 134(H)   HDL > OR = 40 mg/dL 76 51   Trlycerides <150 mg/dL 138 139   Hemoglobin A1c <5.7 % of total Hgb 5.5 5.7(H)       Pulmonary Assessment Scores: Pulmonary Assessment Scores  [place] Name 10/27/19 1117         ADL UCSD   ADL Phase  Entry     SOB Score total  41     Rest  0     Walk  3     Stairs  4     Bath  2     Dress  2     Shop  1       CAT Score   CAT Score  15        UCSD: Self-administered rating of dyspnea associated with activities of daily living (ADLs) 6-point scale (0 = "not at all" to 5 = "maximal or unable to do because of breathlessness")  Scoring Scores range from 0 to 120.  Minimally important difference is 5 units  CAT: CAT can identify the health impairment of COPD patients and is better correlated with disease progression.  CAT has a scoring range of zero to 40. The CAT score is classified into four groups of low (less than 10), medium (10 - 20), high (21-30) and very  high (31-40) based on the impact level of disease on health status. A CAT score over 10 suggests significant symptoms.  A worsening CAT score could be explained by an exacerbation, poor medication adherence, poor inhaler technique, or progression of COPD or comorbid conditions.  CAT MCID is 2 points  mMRC: mMRC (Modified Medical Research Council) Dyspnea Scale is used to assess the degree of baseline functional disability in patients of respiratory disease due to dyspnea. No minimal important difference is established. A decrease in score of 1 point or greater is considered a positive change.   Pulmonary Function Assessment: Pulmonary Function Assessment - 10/27/19 1118      Breath   Shortness of Breath  Yes;Limiting activity       Exercise Target Goals: Exercise Program Goal: Individual exercise prescription set using results from initial 6 min walk test and THRR while considering  patient's activity barriers and safety.   Exercise Prescription Goal: Initial exercise prescription builds to 30-45 minutes a day of aerobic activity, 2-3 days per week.  Home exercise guidelines will be given to patient during program as part of exercise prescription that the participant will acknowledge.  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 HR  95 bpm  Max Ex. BP  146/72     2 Minute Post BP  134/70       Interval HR   1 Minute HR  82     2 Minute HR  89     3 Minute HR  89     4 Minute HR  95     5 Minute HR  92     6 Minute HR  93     2 Minute Post HR  67     Interval Heart Rate?  Yes       Interval Oxygen   Interval Oxygen?  Yes     Baseline Oxygen Saturation %  95 %     1 Minute Oxygen Saturation %  89 %     1 Minute Liters of Oxygen  0 L Room Air     2 Minute Oxygen Saturation %  86 %     2 Minute Liters of Oxygen  0 L     3 Minute Oxygen Saturation %  82 %     3 Minute Liters of Oxygen  0 L     4 Minute Oxygen Saturation %  81 %     4 Minute Liters of Oxygen  0 L     5 Minute Oxygen Saturation %  80 %     5 Minute Liters of Oxygen  0 L     6 Minute Oxygen Saturation %  79 %     6 Minute Liters of Oxygen  0 L     2 Minute Post Oxygen Saturation %  91 %     2 Minute Post Liters of Oxygen  0 L       Oxygen Initial Assessment: Oxygen Initial Assessment - 10/28/19 1146      Home Oxygen   Home Oxygen Device  Home Concentrator;E-Tanks    Sleep Oxygen Prescription  Continuous    Liters per minute  2    Home Exercise Oxygen Prescription  None    Home at Rest Exercise Oxygen Prescription  Continuous    Liters per minute  2    Compliance with Home Oxygen Use  No      Initial 6 min Walk   Oxygen Used  None      Program Oxygen Prescription   Program Oxygen Prescription  Continuous;E-Tanks    Liters per minute  2    Comments  try with weight bearing only first and monitor saturations      Intervention   Short Term Goals  To learn and exhibit compliance with exercise, home and travel O2 prescription;To learn and understand importance of monitoring SPO2 with pulse oximeter and demonstrate accurate use of the pulse oximeter.;To learn and understand importance of maintaining oxygen saturations>88%;To  learn and demonstrate proper pursed lip breathing techniques or other breathing techniques.;To learn and demonstrate proper use of respiratory medications    Long  Term Goals  Exhibits compliance with exercise, home and travel O2 prescription;Verbalizes importance of monitoring SPO2 with pulse oximeter and return demonstration;Maintenance of O2 saturations>88%;Exhibits proper breathing techniques, such as pursed lip breathing or other method taught during program session;Compliance with respiratory medication;Demonstrates proper use of MDI's       Oxygen Re-Evaluation:   Oxygen Discharge (Final Oxygen Re-Evaluation):   Initial Exercise Prescription: Initial Exercise Prescription - 10/28/19 1100      Date of Initial Exercise RX and Referring Provider   Date  10/28/19    Referring Provider  Brand Males MD      Oxygen   Oxygen  Continuous    Liters  2      Treadmill   MPH  2.4    Grade  0.5    Minutes  15    METs  3      NuStep   Level  3    SPM  80    Minutes  15    METs  3      Elliptical   Level  1    Speed  3    Minutes  15    METs  3      Biostep-RELP   Level  3    SPM  50    Minutes  15    METs  3      Prescription Details   Frequency (times per week)  2    Duration  Progress to 30 minutes of continuous aerobic without signs/symptoms of physical distress      Intensity   THRR 40-80% of Max Heartrate  98-138    Ratings of Perceived Exertion  11-13    Perceived Dyspnea  0-4      Progression   Progression  Continue to progress workloads to maintain intensity without signs/symptoms of physical distress.      Resistance Training   Training Prescription  Yes    Weight  5 lb    Reps  10-15       Perform Capillary Blood Glucose checks as needed.  Exercise Prescription Changes: Exercise Prescription Changes    Row Name 10/28/19 1100             Response to Exercise   Blood Pressure (Admit)  130/74       Blood Pressure (Exercise)  146/72        Blood Pressure (Exit)  132/70       Heart Rate (Admit)  60 bpm       Heart Rate (Exercise)  95 bpm       Heart Rate (Exit)  62 bpm       Oxygen Saturation (Admit)  95 %       Oxygen Saturation (Exercise)  79 %       Oxygen Saturation (Exit)  95 %       Rating of Perceived Exertion (Exercise)  7       Perceived Dyspnea (Exercise)  1       Symptoms  none       Comments  walk test results          Exercise Comments:   Exercise Goals and Review: Exercise Goals    Row Name 10/28/19 1144             Exercise Goals   Increase Physical Activity  Yes       Intervention  Provide advice, education, support and counseling about physical activity/exercise needs.;Develop an individualized exercise prescription for aerobic and resistive training based on initial evaluation findings, risk stratification, comorbidities and participant's personal goals.       Expected Outcomes  Short Term: Attend rehab on a regular basis to increase amount of physical activity.;Long Term: Add in home exercise to make exercise part of routine and to increase amount of physical activity.;Long Term: Exercising regularly at least 3-5 days a week.       Increase Strength and Stamina  Yes       Intervention  Provide advice, education, support and counseling about physical activity/exercise  needs.;Develop an individualized exercise prescription for aerobic and resistive training based on initial evaluation findings, risk stratification, comorbidities and participant's personal goals.       Expected Outcomes  Short Term: Increase workloads from initial exercise prescription for resistance, speed, and METs.;Short Term: Perform resistance training exercises routinely during rehab and add in resistance training at home;Long Term: Improve cardiorespiratory fitness, muscular endurance and strength as measured by increased METs and functional capacity (6MWT)       Able to understand and use rate of perceived exertion (RPE)  scale  Yes       Intervention  Provide education and explanation on how to use RPE scale       Expected Outcomes  Short Term: Able to use RPE daily in rehab to express subjective intensity level;Long Term:  Able to use RPE to guide intensity level when exercising independently       Able to understand and use Dyspnea scale  Yes       Intervention  Provide education and explanation on how to use Dyspnea scale       Expected Outcomes  Short Term: Able to use Dyspnea scale daily in rehab to express subjective sense of shortness of breath during exertion;Long Term: Able to use Dyspnea scale to guide intensity level when exercising independently       Knowledge and understanding of Target Heart Rate Range (THRR)  Yes       Intervention  Provide education and explanation of THRR including how the numbers were predicted and where they are located for reference       Expected Outcomes  Short Term: Able to state/look up THRR;Short Term: Able to use daily as guideline for intensity in rehab;Long Term: Able to use THRR to govern intensity when exercising independently       Able to check pulse independently  Yes       Intervention  Provide education and demonstration on how to check pulse in carotid and radial arteries.;Review the importance of being able to check your own pulse for safety during independent exercise       Expected Outcomes  Short Term: Able to explain why pulse checking is important during independent exercise;Long Term: Able to check pulse independently and accurately       Understanding of Exercise Prescription  Yes       Intervention  Provide education, explanation, and written materials on patient's individual exercise prescription       Expected Outcomes  Long Term: Able to explain home exercise prescription to exercise independently;Short Term: Able to explain program exercise prescription          Exercise Goals Re-Evaluation :   Discharge Exercise Prescription (Final Exercise  Prescription Changes): Exercise Prescription Changes - 10/28/19 1100      Response to Exercise   Blood Pressure (Admit)  130/74    Blood Pressure (Exercise)  146/72    Blood Pressure (Exit)  132/70    Heart Rate (Admit)  60 bpm    Heart Rate (Exercise)  95 bpm    Heart Rate (Exit)  62 bpm    Oxygen Saturation (Admit)  95 %    Oxygen Saturation (Exercise)  79 %    Oxygen Saturation (Exit)  95 %    Rating of Perceived Exertion (Exercise)  7    Perceived Dyspnea (Exercise)  1    Symptoms  none    Comments  walk test results       Nutrition:  Target  Goals: Understanding of nutrition guidelines, daily intake of sodium <1585m, cholesterol <2020m calories 30% from fat and 7% or less from saturated fats, daily to have 5 or more servings of fruits and vegetables.  Biometrics: Pre Biometrics - 10/28/19 1145      Pre Biometrics   Height  5' 10.75" (1.797 m)    Weight  168 lb 14.4 oz (76.6 kg)    BMI (Calculated)  23.73    Single Leg Stand  30 seconds        Nutrition Therapy Plan and Nutrition Goals:   Nutrition Assessments: Nutrition Assessments - 10/27/19 1118      MEDFICTS Scores   Pre Score  23       Nutrition Goals Re-Evaluation:   Nutrition Goals Discharge (Final Nutrition Goals Re-Evaluation):   Psychosocial: Target Goals: Acknowledge presence or absence of significant depression and/or stress, maximize coping skills, provide positive support system. Participant is able to verbalize types and ability to use techniques and skills needed for reducing stress and depression.   Initial Review & Psychosocial Screening: Initial Psych Review & Screening - 10/27/19 0846      Initial Review   Current issues with  Current Stress Concerns    Source of Stress Concerns  Chronic Illness;Unable to participate in former interests or hobbies;Unable to perform yard/household activities    Comments  Pulmonary Fibrosis and Arthritis keep him from doing everything that he wants        [place] Yes   both have several children, grandkids, neighbors     Barriers   Psychosocial barriers to participate in program  The patient should benefit from training in stress management and relaxation.;Psychosocial barriers identified (see note)      Screening Interventions   Interventions  Encouraged to exercise;To provide support and resources with identified psychosocial needs;Provide feedback about the scores to participant    Expected Outcomes  Short Term goal: Utilizing psychosocial counselor, staff and physician to assist with identification of specific Stressors or current issues interfering with healing process. Setting desired goal for each stressor or current issue identified.;Long Term Goal: Stressors or current issues are controlled or eliminated.;Short Term goal: Identification and review with participant of any Quality of Life or Depression concerns found by scoring the questionnaire.;Long Term goal: The participant improves quality of Life and PHQ9 Scores as seen by post scores and/or verbalization of changes       Quality of Life Scores:  Scores of 19 and below usually indicate a poorer quality of life in these areas.  A difference of  2-3 points is a clinically meaningful difference.  A difference of 2-3 points in the total score of the Quality of Life Index has been associated with significant improvement in overall quality of life, self-image, physical symptoms, and general health in studies assessing change in quality of life.  PHQ-9: Recent Review Flowsheet Data    [facility] 10/28/2019 09/08/2019 07/19/2019 05/07/2017   Decreased Interest 1 0 0 0   Down, Depressed, Hopeless 0 0 0 0   PHQ - 2 Score 1 0 0 0   Altered sleeping 2 - - -   Tired, decreased energy 1 - - -   Change in appetite 0 - - -   Feeling bad or failure about yourself  1 - - -   Trouble concentrating 0 - - -   Moving slowly or fidgety/restless 1 - -  -   Suicidal thoughts  2  - - -   PHQ-9 Score 8 - - -   Difficult doing work/chores Not difficult at all - - -     Interpretation of Total Score  Total Score Depression Severity:  1-4 = Minimal depression, 5-9 = Mild depression, 10-14 = Moderate depression, 15-19 = Moderately severe depression, 20-27 = Severe depression   Psychosocial Evaluation and Intervention: Psychosocial Evaluation - 10/27/19 0855      Psychosocial Evaluation & Interventions   Interventions  Stress management education;Encouraged to exercise with the program and follow exercise prescription    Comments  Jerrol 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  He has always been an avid exerciser at [employer] prior to the pandemic last year.  He has rhuematoid arthritis all over which also limits his ability to do things and keeps him in chronic pain.  He has a strong support system in his wife.  He is coming to the program after his doctor recommended that he should be supervised as he gets back into his exercise routine.  He wants to get back to exercise and feel better again.  He wants to be able to breathe and able to do more.    Expected Outcomes  Short: Attend rehab to get back into exercise routine.  Long: Continue to improve breathing.    Continue Psychosocial Services   Follow up required by staff       Psychosocial Re-Evaluation:   Psychosocial Discharge (Final Psychosocial Re-Evaluation):   Education: Education Goals: Education classes will be provided on a weekly basis, covering required topics. Participant will state understanding/return demonstration of topics presented.  Learning Barriers/Preferences: Learning Barriers/Preferences - 10/27/19 5597      Learning Barriers/Preferences   Learning Barriers  None    Learning Preferences  None       Education Topics:  Initial Evaluation Education: - Verbal, written and demonstration of respiratory meds, oximetry and breathing  techniques. Instruction on use of nebulizers and MDIs and importance of monitoring MDI activations.   Pulmonary Rehab from 10/28/2019 in [facility] Cardiac and Pulmonary Rehab  Date  10/28/19  Educator  [facility]  Instruction Review Code  1- Verbalizes Understanding      General Nutrition Guidelines/Fats and Fiber: -Group instruction provided by verbal, written material, models and posters to present the general guidelines for heart healthy nutrition. Gives an explanation and review of dietary fats and fiber.   Controlling Sodium/Reading Food Labels: -Group verbal and written material supporting the discussion of sodium use in heart healthy nutrition. Review and explanation with models, verbal and written materials for utilization of the food label.   Exercise Physiology & General Exercise Guidelines: - Group verbal and written instruction with models to review the exercise physiology of the cardiovascular system and associated critical values. Provides general exercise guidelines with specific guidelines to those with heart or lung disease.    Aerobic Exercise & Resistance Training: - Gives group verbal and written instruction on the various components of exercise. Focuses on aerobic and resistive training programs and the benefits of this training and how to safely progress through these programs.   Flexibility, Balance, Mind/Body Relaxation: Provides group verbal/written instruction on the benefits of flexibility and balance training, including mind/body exercise modes such as yoga, pilates and tai chi.  Demonstration and skill practice provided.   Stress and Anxiety: - Provides group verbal and written instruction about the health risks of elevated stress and causes of high stress.  Discuss the correlation  between heart/lung disease and anxiety and treatment options. Review healthy ways to manage with stress and anxiety.   Depression: - Provides group verbal and written instruction on the  correlation between heart/lung disease and depressed mood, treatment options, and the stigmas associated with seeking treatment.   Exercise & Equipment Safety: - Individual verbal instruction and demonstration of equipment use and safety with use of the equipment.   Pulmonary Rehab from 10/28/2019 in [facility] Cardiac and Pulmonary Rehab  Date  10/28/19  Educator  [facility]  Instruction Review Code  1- Verbalizes Understanding      Infection Prevention: - Provides verbal and written material to individual with discussion of infection control including proper hand washing and proper equipment cleaning during exercise session.   Pulmonary Rehab from 10/28/2019 in [facility] Cardiac and Pulmonary Rehab  Date  10/28/19  Educator  [facility]  Instruction Review Code  1- Verbalizes Understanding      Falls Prevention: - Provides verbal and written material to individual with discussion of falls prevention and safety.   Pulmonary Rehab from 10/28/2019 in [facility] Cardiac and Pulmonary Rehab  Date  10/28/19  Educator  [facility]  Instruction Review Code  1- Verbalizes Understanding      Diabetes: - Individual verbal and written instruction to review signs/symptoms of diabetes, desired ranges of glucose level fasting, after meals and with exercise. Advice that pre and post exercise glucose checks will be done for 3 sessions at entry of program.   Chronic Lung Diseases: - Group verbal and written instruction to review updates, respiratory medications, advancements in procedures and treatments. Discuss use of supplemental oxygen including available portable oxygen systems, continuous and intermittent flow rates, concentrators, personal use and safety guidelines. Review proper use of inhaler and spacers. Provide informative websites for self-education.    Energy Conservation: - Provide group verbal and written instruction for methods to conserve energy, plan and organize activities. Instruct on pacing techniques, use of adaptive  equipment and posture/positioning to relieve shortness of breath.   Triggers and Exacerbations: - Group verbal and written instruction to review types of environmental triggers and ways to prevent exacerbations. Discuss weather changes, air quality and the benefits of nasal washing. Review warning signs and symptoms to help prevent infections. Discuss techniques for effective airway clearance, coughing, and vibrations.   AED/CPR: - Group verbal and written instruction with the use of models to demonstrate the basic use of the AED with the basic ABC's of resuscitation.   Anatomy and Physiology of the Lungs: - Group verbal and written instruction with the use of models to provide basic lung anatomy and physiology related to function, structure and complications of lung disease.   Anatomy & Physiology of the Heart: - Group verbal and written instruction and models provide basic cardiac anatomy and physiology, with the coronary electrical and arterial systems. Review of Valvular disease and Heart Failure   Cardiac Medications: - Group verbal and written instruction to review commonly prescribed medications for heart disease. Reviews the medication, class of the drug, and side effects.   Know Your Numbers and Risk Factors: -Group verbal and written instruction about important numbers in your health.  Discussion of what are risk factors and how they play a role in the disease process.  Review of Cholesterol, Blood Pressure, Diabetes, and BMI and the role they play in your overall health.   Sleep Hygiene: -Provides group verbal and written instruction about how sleep can affect your health.  Define sleep hygiene, discuss sleep cycles and impact  of sleep habits. Review good sleep hygiene tips.    Other: -Provides group and verbal instruction on various topics (see comments)    Knowledge Questionnaire Score: Knowledge Questionnaire Score - 10/27/19 1118      Knowledge Questionnaire  Score   Pre Score  15/18 Education Focus: O2 Safety and Exercise        Core Components/Risk Factors/Patient Goals at Admission: Personal Goals and Risk Factors at Admission - 10/28/19 1145      Core Components/Risk Factors/Patient Goals on Admission    Weight Management  Yes;Weight Maintenance    Intervention  Weight Management: Develop a combined nutrition and exercise program designed to reach desired caloric intake, while maintaining appropriate intake of nutrient and fiber, sodium and fats, and appropriate energy expenditure required for the weight goal.;Weight Management: Provide education and appropriate resources to help participant work on and attain dietary goals.    Admit Weight  168 lb 14.4 oz (76.6 kg)    Goal Weight: Short Term  165 lb (74.8 kg)    Goal Weight: Long Term  165 lb (74.8 kg)    Expected Outcomes  Short Term: Continue to assess and modify interventions until short term weight is achieved;Long Term: Adherence to nutrition and physical activity/exercise program aimed toward attainment of established weight goal;Weight Maintenance: Understanding of the daily nutrition guidelines, which includes 25-35% calories from fat, 7% or less cal from saturated fats, less than 278m cholesterol, less than 1.5gm of sodium, & 5 or more servings of fruits and vegetables daily    Improve shortness of breath with ADL's  Yes    Intervention  Provide education, individualized exercise plan and daily activity instruction to help decrease symptoms of SOB with activities of daily living.    Expected Outcomes  Short Term: Improve cardiorespiratory fitness to achieve a reduction of symptoms when performing ADLs;Long Term: Be able to perform more ADLs without symptoms or delay the onset of symptoms    Lipids  Yes    Intervention  Provide education and support for participant on nutrition & aerobic/resistive exercise along with prescribed medications to achieve LDL <766m HDL >4035m   Expected  Outcomes  Short Term: Participant states understanding of desired cholesterol values and is compliant with medications prescribed. Participant is following exercise prescription and nutrition guidelines.;Long Term: Cholesterol controlled with medications as prescribed, with individualized exercise RX and with personalized nutrition plan. Value goals: LDL < 93m74mDL > 40 mg.       Core Components/Risk Factors/Patient Goals Review:    Core Components/Risk Factors/Patient Goals at Discharge (Final Review):    ITP Comments: ITP Comments    Row Name 10/27/19 0912 10/28/19 1107         ITP Comments  Completed virtual orientation today.  EP evaluation is scheduled for Thursday 3/11 at 930am.  [facility]  Completed 6MWT and gym orientation.  Initial ITP created and sent for review to Dr. MarkEmily Filbertdical Director.         Comments: Initial ITP

## 2019-10-28 NOTE — Telephone Encounter (Signed)
Spoke with patient's wife. She stated that the patient attended his first pulmonary rehab session today and was advised that he will probably do better with a POC. He has oxygen now but he is having to carry around small tanks. They went home and did some research and reached out to [employer]. [employer] advised them that they sent him a POC within a week, they just need an order.   She wanted to know if we place orders for [employer] POCs. I advised her that we do, but [employer] is not a local company meaning if something happens to his POC, he can not take it to a local store for it to be repaired. She was not aware of this information. They have also tried to get a POC through [employer] but were told that he would not receive one for at least 6 months.   Will need to call [facility] in the morning to see if they have POCs.   MR, do you have any recommendations for him?

## 2019-10-29 NOTE — Telephone Encounter (Signed)
Called the patient and advised of the response from Dr. Chase Caller. Advised that he may want to contact [employer] to confirm how they would service the machine if it breaks, would they provide a loaner until the original has been repaired? What would be the out of pocket cost if any?  Patient agreed to talk with his wife to decide if we should go ahead and send order to DME or issue prescription for [employer]. Patient stated he uses 2L and uses O2 while working during the day, but the POC/[employer] would be easier to carry.  He stated if they decide to proceed forward, he will call our office to let us know. Nothing further needed at this time.

## 2019-10-29 NOTE — Telephone Encounter (Signed)
[employer] is good quality and I have not heard complaints from patients -t hey have to ask [employer] what their service is like. They probably can still give good service despite being far away and at end of day is between patient an [employer]  You can try [facility] but due to covid there is shortage on portables  So patient might just have to go to [facility]

## 2019-10-29 NOTE — Progress Notes (Addendum)
Office Visit Note  Patient: Lawrence Wells             Date of Birth: 13-Apr-1956           MRN: [id number]             PCP: Olin Hauser, DO Referring: Nobie Putnam * Visit Date: 11/04/2019 Occupation: '@GUAROCC' @  Subjective:  Pain and stiffness in joints.   History of Present Illness: Lawrence Wells is a 64 y.o. male with history of rheumatoid arthritis and interstitial lung disease.  He states he is feeling 60% better as regards to his joint pain.  He was started on prednisone 10 mg p.o. daily.  He has reduced it down to prednisone 7.5 mg p.o. daily now.  The plan was to start him on Imuran today.  Continues to have some shortness of breath.  He is going to pulmonary rehab.  Activities of Daily Living:  Patient reports morning stiffness for 30-40 minutes.   Patient Denies nocturnal pain.  Difficulty dressing/grooming: Denies Difficulty climbing stairs: Denies Difficulty getting out of chair: Denies Difficulty using hands for taps, buttons, cutlery, and/or writing: Denies  Review of Systems  Constitutional: Negative for fatigue and night sweats.  HENT: Negative for mouth sores, mouth dryness and nose dryness.   Eyes: Negative for redness, itching and dryness.  Respiratory: Positive for shortness of breath and difficulty breathing.        Due to lung disease  Cardiovascular: Negative for chest pain, palpitations, hypertension, irregular heartbeat and swelling in legs/feet.  Gastrointestinal: Negative for blood in stool, constipation and diarrhea.  Endocrine: Negative for increased urination.  Genitourinary: Negative for difficulty urinating and painful urination.  Musculoskeletal: Positive for arthralgias, joint pain and morning stiffness. Negative for joint swelling, myalgias, muscle weakness, muscle tenderness and myalgias.  Skin: Negative for color change, rash, hair loss, nodules/bumps, skin tightness, ulcers and sensitivity to sunlight.    Allergic/Immunologic: Negative for susceptible to infections.  Neurological: Positive for numbness. Negative for dizziness, fainting, headaches, memory loss, night sweats and weakness.  Hematological: Positive for bruising/bleeding tendency. Negative for swollen glands.  Psychiatric/Behavioral: Negative for depressed mood, confusion and sleep disturbance. The patient is not nervous/anxious.     PMFS History:  Patient Active Problem List   Diagnosis Date Noted  . Coronary artery calcification 09/26/2019  . Pulmonary fibrosis ([place]) 09/08/2019  . Myalgia due to statin 09/08/2019  . Chronic hip pain, bilateral 06/09/2017  . Chronic bilateral low back pain with bilateral sciatica 06/09/2017  . Osteoarthritis of multiple joints 05/07/2017  . Genital herpes 05/07/2017  . Screening for prostate cancer 05/07/2017  . Hypercholesterolemia 03/30/2003  . Former smoker 02/26/1995    Past Medical History:  Diagnosis Date  . Arthritis   . COPD (chronic obstructive pulmonary disease) ([place])   . Coronary artery disease   . Dyspnea   . Emphysema lung ([place])   . Hyperlipidemia   . Pulmonary filariasis     Family History  Problem Relation Age of Onset  . Heart disease Mother   . Heart attack Mother 48  . Arthritis Mother   . Heart disease Father   . Heart attack Father 48  . Healthy Sister   . Hyperlipidemia Brother   . Throat cancer Brother 9  . Heart disease Brother   . Healthy Son   . Healthy Daughter    Past Surgical History:  Procedure Laterality Date  . SINUS SURGERY WITH INSTATRAK     Social History  Social History Narrative  . Not on file   Immunization History  Administered Date(s) Administered  . Pneumococcal Conjugate-13 09/08/2019  . Tdap 04/19/2015, 05/07/2017     Objective: Vital Signs: BP 118/71 (BP Location: Left Arm, Patient Position: Sitting, Cuff Size: Normal)   Pulse 68   Resp 17   Ht '5\' 11"'  (1.803 m)   Wt 172 lb 6.4 oz (78.2 kg)   BMI 24.04 kg/m     Physical Exam Vitals and nursing note reviewed.  Constitutional:      Appearance: He is well-developed.  HENT:     Head: Normocephalic and atraumatic.  Eyes:     Conjunctiva/sclera: Conjunctivae normal.     Pupils: Pupils are equal, round, and reactive to light.  Cardiovascular:     Rate and Rhythm: Normal rate and regular rhythm.     Heart sounds: Normal heart sounds.  Pulmonary:     Effort: Pulmonary effort is normal.     Breath sounds: Normal breath sounds.  Abdominal:     General: Bowel sounds are normal.     Palpations: Abdomen is soft.  Musculoskeletal:     Cervical back: Normal range of motion and neck supple.  Skin:    General: Skin is warm and dry.     Capillary Refill: Capillary refill takes less than 2 seconds.  Neurological:     Mental Status: He is alert and oriented to person, place, and time.  Psychiatric:        Behavior: Behavior normal.      Musculoskeletal Exam: Patient had good range of motion of his cervical spine and lumbar spine.  He had good range of motion of his shoulder joints without discomfort.  Elbow joints wrist joints were in good range of motion.  He had no synovitis over MCP joints.  PIP and DIP joints were prominent without synovitis.  He had good range of motion of his joints.  He has good range of motion of his knee joints without any warmth swelling or effusion.  He had discomfort range of motion of his knee joints.  Ankle joints with good range of motion.  He had no tenderness over MTPs.  CDAI Exam: CDAI Score: 2.8  Patient Global: 5 mm; Provider Global: 3 mm Swollen: 0 ; Tender: 2  Joint Exam 11/04/2019      Right  Left  Knee   Tender   Tender     Investigation: No additional findings.  Imaging: DG Elbow Complete Left  Result Date: 10/09/2019 CLINICAL DATA:  Left elbow pain with pronation and supination, nontraumatic. EXAM: LEFT ELBOW - COMPLETE 3+ VIEW COMPARISON:  None. FINDINGS: There is no evidence of fracture,  dislocation, or joint effusion. There is no evidence of arthropathy or other focal bone abnormality. Soft tissues are unremarkable. IMPRESSION: No acute osseous abnormality. No significant left elbow arthropathy. Electronically Signed   By: Ilona Sorrel M.D.   On: 10/09/2019 09:46   NM Myocar Multi W/Spect W/Wall Motion / EF  Result Date: 10/14/2019 Pharmacological myocardial perfusion imaging study with no significant  ischemia Normal wall motion, EF estimated at 54% Mild GI uptake artifact No EKG changes concerning for ischemia at peak stress or in recovery. Resting EKG with T wave inversions V1 through V5 Moderate three-vessel coronary calcification, no significant aortic atherosclerosis Low risk scan Signed, Esmond Plants, MD, Ph.D [facility]   DG Shoulder Left  Result Date: 10/09/2019 CLINICAL DATA:  Chronic left shoulder pain for 1-2 years. No recent injury but  worsening symptoms EXAM: LEFT SHOULDER - 2+ VIEW COMPARISON:  None. FINDINGS: There is no evidence of fracture or dislocation. There is no evidence of arthropathy or other focal bone abnormality. Soft tissues are unremarkable. Interstitial coarsening in the lung that correlates with emphysema and fibrosis by recent chest CT. IMPRESSION: Negative left shoulder. Electronically Signed   By: Monte Fantasia M.D.   On: 10/09/2019 09:14   XR Foot 2 Views Left  Result Date: 10/21/2019 First MTP, PIP and DIP narrowing was noted.  None of the other MTP joint space narrowing was noted.  No intertarsal tibiotalar joint space narrowing was noted.  No erosive changes were noted. Impression: These findings are consistent with osteoarthritis of the foot.  XR Foot 2 Views Right  Result Date: 10/21/2019 First MTP, PIP and DIP narrowing was noted.  None of the other MTP joint space narrowing was noted.  No intertarsal tibiotalar joint space narrowing was noted.  No erosive changes were noted. Impression: These findings are consistent with osteoarthritis of  the foot.  XR Hand 2 View Left  Result Date: 10/21/2019 CMC, PIP and DIP narrowing was noted.  No MCP, intercarpal radiocarpal joint space narrowing was noted.  Juxta-articular osteopenia was noted.  No erosive changes were noted. Impression: These findings are consistent with inflammatory arthritis and osteoarthritis overlap.  XR Hand 2 View Right  Result Date: 10/21/2019 Mild first and second MCP joint narrowing was noted.  PIP narrowing was noted.  No intercarpal radiocarpal joint space narrowing was noted.  No erosive changes were noted. Impression: These findings are consistent with osteoarthritis or inflammatory arthritis overlap.  XR KNEE 3 VIEW LEFT  Result Date: 10/21/2019 No medial lateral compartment narrowing was noted.  No chondrocalcinosis was noted.  No patellofemoral narrowing was noted. Impression: Unremarkable x-ray of the knee joint.  XR KNEE 3 VIEW RIGHT  Result Date: 10/21/2019 Mild medial compartment narrowing was noted.  Mild patellofemoral narrowing was noted.  No chondrocalcinosis was noted. Impression: These findings are consistent mild osteoarthritis and mild chondromalacia patella.   Recent Labs: Lab Results  Component Value Date   WBC 11.2 (H) 09/10/2019   HGB 16.5 09/10/2019   PLT 347 09/10/2019   NA 137 09/10/2019   K 4.2 09/10/2019   CL 103 09/10/2019   CO2 23 09/10/2019   GLUCOSE 91 09/10/2019   BUN 28 (H) 09/10/2019   CREATININE 0.77 09/10/2019   BILITOT 0.6 10/22/2019   ALKPHOS 104 10/22/2019   AST 22 10/22/2019   ALT 19 10/22/2019   PROT 8.4 (H) 10/22/2019   ALBUMIN 4.1 10/22/2019   CALCIUM 9.2 09/10/2019   GFRAA >60 09/10/2019   QFTBGOLDPLUS NEGATIVE 10/21/2019  October 21, 2019 UA negative, SPEP normal, hepatitis B-, IgG elevated, IgA elevated, TB Gold negative, ANA 1: 320 cytoplasmic, Smith negative, TPMT normal  09/10/19: CK 97, aldolase 5.8, Ro 2.6, La-, Scl-70-, RF 490.7, dsDNA 3, Ace 28, ESR 17, CCP 15.  Speciality Comments: No specialty  comments available.  Procedures:  No procedures performed Allergies: Atorvastatin   Assessment / Plan:     Visit Diagnoses: Rheumatoid arthritis involving multiple sites with positive rheumatoid factor (HCC) - Positive rheumatoid factor, anti-CCP negative, positive ANA, positive Ro.  Positive synovitis.  He was given a prednisone taper at the last visit.  He took prednisone 10 mg p.o. daily for 2 weeks and felt 60% better.  He is on prednisone 7.5 mg p.o. daily now which will be tapering gradually.  At the last visit we  discussed Imuran and the side effects.  He was in agreement.  We are waiting on TPMT to return to start him on Imuran.  Indications adverse contraindications of Imuran were discussed.  The plan is to start him on Imuran 50 mg p.o. daily.  We will check labs in 2 weeks.  If labs are normal then we will increase Imuran to 100 mg p.o. daily and check labs in 2 weeks.  Medication counseling:  TPMT: Normal  Patient was counseled on the purpose, proper use, and adverse effects of azathioprine including risk of infection, nausea, rash, and hair loss.  Reviewed risk of cancer after long term use.  Discussed risk of myelosupression and reviewed importance of frequent lab work to monitor blood counts.  Reviewed drug-drug interactions including contraindication with allopurinol.  Provided patient with educational materials on azathioprine and answered all questions.  Patient consented to azathioprine.  Will upload consent into the media tab.    High risk medication use - Imuran 50 mg p.o. daily will be started today.  Primary osteoarthritis of right knee - Mild osteoarthritis.  He continues to have pain and discomfort in his bilateral knee joints.  Primary osteoarthritis of both feet-he is currently not complaining of feet discomfort.  ILD (interstitial lung disease) ([place]) - Followed by Dr. Chase Caller.  On antifibrotic agents.  Pulmonary fibrosis ([place])  Former smoker  Coronary artery  calcification  Chronic bilateral low back pain with bilateral sciatica  Myalgia due to statin  Hypercholesterolemia  Orders: Orders Placed This Encounter  Procedures  . CBC with Differential/Platelet  . CMP14+EGFR   Meds ordered this encounter  Medications  . azaTHIOprine (IMURAN) 50 MG tablet    Sig: Take 1 tablet by mouth daily for 2 weeks, if labs are stable, increase to 2 tablets by mouth daily.    Dispense:  42 tablet    Refill:  0    Face-to-face time spent with patient was  30 minutes. Greater than 50% of time was spent in counseling and coordination of care.  Follow-Up Instructions: Return in about 4 weeks (around 12/02/2019) for Rheumatoid arthritis, ILD.   Bo Merino, MD  Note - This record has been created using Editor, commissioning.  Chart creation errors have been sought, but may not always  have been located. Such creation errors do not reflect on  the standard of medical care.

## 2019-10-31 LAB — URINALYSIS, ROUTINE W REFLEX MICROSCOPIC
Bilirubin Urine: NEGATIVE
Glucose, UA: NEGATIVE
Hgb urine dipstick: NEGATIVE
Ketones, ur: NEGATIVE
Leukocytes,Ua: NEGATIVE
Nitrite: NEGATIVE
Protein, ur: NEGATIVE
Specific Gravity, Urine: 1.019 (ref 1.001–1.03)
pH: <=5 (ref 5.0–8.0)

## 2019-10-31 LAB — HEPATITIS B SURFACE ANTIGEN: Hepatitis B Surface Ag: NONREACTIVE

## 2019-10-31 LAB — IGG, IGA, IGM
IgG (Immunoglobin G), Serum: 1938 mg/dL — ABNORMAL HIGH (ref 600–1540)
IgM, Serum: 113 mg/dL (ref 50–300)
Immunoglobulin A: 424 mg/dL — ABNORMAL HIGH (ref 70–320)

## 2019-10-31 LAB — ANTI-SMITH ANTIBODY: ENA SM Ab Ser-aCnc: <1 AI

## 2019-10-31 LAB — PROTEIN ELECTROPHORESIS, SERUM, WITH REFLEX
Albumin ELP: 3.9 g/dL (ref 3.8–4.8)
Alpha 1: 0.4 g/dL — ABNORMAL HIGH (ref 0.2–0.3)
Alpha 2: 1 g/dL — ABNORMAL HIGH (ref 0.5–0.9)
Beta 2: 0.5 g/dL (ref 0.2–0.5)
Beta Globulin: 0.5 g/dL (ref 0.4–0.6)
Gamma Globulin: 1.6 g/dL (ref 0.8–1.7)
Total Protein: 8 g/dL (ref 6.1–8.1)

## 2019-10-31 LAB — ANA: Anti Nuclear Antibody (ANA): POSITIVE — AB

## 2019-10-31 LAB — QUANTIFERON-TB GOLD PLUS
Mitogen-NIL: >10 IU/mL
NIL: 0.02 IU/mL
QuantiFERON-TB Gold Plus: NEGATIVE
TB1-NIL: 0.01 IU/mL
TB2-NIL: 0 IU/mL

## 2019-10-31 LAB — ANTI-NUCLEAR AB-TITER (ANA TITER): ANA Titer 1: 1:320 {titer} — ABNORMAL HIGH

## 2019-10-31 LAB — 14-3-3 ETA PROTEIN: 14-3-3 eta Protein: 5.6 ng/mL — ABNORMAL HIGH (ref <0.2)

## 2019-10-31 LAB — HEPATITIS B CORE ANTIBODY, IGM: Hep B C IgM: NONREACTIVE

## 2019-10-31 LAB — THIOPURINE METHYLTRANSFERASE (TPMT), RBC: Thiopurine Methyltransferase, RBC: 16 nmol/hr/mL RBC

## 2019-11-01 NOTE — Progress Notes (Signed)
I will discuss results at the follow-up visit.

## 2019-11-03 ENCOUNTER — Encounter: Payer: PPO | Admitting: *Deleted

## 2019-11-03 ENCOUNTER — Other Ambulatory Visit: Payer: Self-pay

## 2019-11-03 DIAGNOSIS — J849 Interstitial pulmonary disease, unspecified: Secondary | ICD-10-CM | POA: Diagnosis not present

## 2019-11-03 NOTE — Progress Notes (Signed)
Daily Session Note  Patient Details  Name: Lawrence Wells MRN: [id number] Date of Birth: [birth year] Referring Provider:     Pulmonary Rehab from 10/28/2019 in [facility] Cardiac and Pulmonary Rehab  Referring Provider  Brand Males MD      Encounter Date: 11/03/2019  Check In: Session Check In - 11/03/19 0823      Check-In   Supervising physician immediately available to respond to emergencies  See telemetry face sheet for immediately available ER MD    Location  [facility]-Cardiac & Pulmonary Rehab    Staff Present  Heath Lark, RN, BSN, CCRP;Joseph Foy [place], [place], ACSM CEP, Exercise Physiologist    Virtual Visit  No    Medication changes reported      No    Fall or balance concerns reported     No    Warm-up and Cool-down  Performed on first and last piece of equipment    Resistance Training Performed  Yes    VAD Patient?  No    PAD/SET Patient?  No      Pain Assessment   Currently in Pain?  No/denies          Social History   Tobacco Use  Smoking Status Former Smoker  . Packs/day: 1.50  . Years: 30.00  . Pack years: 45.00  . Types: Cigarettes  . Quit date: 2007  . Years since quitting: 14.2  Smokeless Tobacco Former Systems developer  . Types: Chew  . Quit date: 2007  Tobacco Comment   Dip smokeless tobacco >20-30 years    Goals Met:  Proper associated with RPD/PD & O2 Sat Exercise tolerated well Personal goals reviewed No report of cardiac concerns or symptoms  Goals Unmet:  O2 Sat  Dropped to 80% on TM  Placed  2l per nasal canuula oxygen on [employer]   Sats back up above 90%  Comments: First full day of exercise!  Patient was oriented to gym and equipment including functions, settings, policies, and procedures.  Patient's individual exercise prescription and treatment plan were reviewed.  All starting workloads were established based on the results of the 6 minute walk test done at initial orientation visit.  The plan for exercise progression was also  introduced and progression will be customized based on patient's performance and goals.    Dr. Emily Filbert is Medical Director for [place] and [facility].

## 2019-11-04 ENCOUNTER — Other Ambulatory Visit: Payer: Self-pay

## 2019-11-04 ENCOUNTER — Encounter: Payer: PPO | Admitting: *Deleted

## 2019-11-04 ENCOUNTER — Encounter: Payer: Self-pay | Admitting: Rheumatology

## 2019-11-04 ENCOUNTER — Ambulatory Visit (INDEPENDENT_AMBULATORY_CARE_PROVIDER_SITE_OTHER): Payer: PPO | Admitting: Rheumatology

## 2019-11-04 VITALS — BP 118/71 | HR 68 | Resp 17 | Ht 71.0 in | Wt 172.4 lb

## 2019-11-04 DIAGNOSIS — Z87891 Personal history of nicotine dependence: Secondary | ICD-10-CM | POA: Diagnosis not present

## 2019-11-04 DIAGNOSIS — M5442 Lumbago with sciatica, left side: Secondary | ICD-10-CM

## 2019-11-04 DIAGNOSIS — M0579 Rheumatoid arthritis with rheumatoid factor of multiple sites without organ or systems involvement: Secondary | ICD-10-CM | POA: Diagnosis not present

## 2019-11-04 DIAGNOSIS — M5441 Lumbago with sciatica, right side: Secondary | ICD-10-CM

## 2019-11-04 DIAGNOSIS — I2510 Atherosclerotic heart disease of native coronary artery without angina pectoris: Secondary | ICD-10-CM | POA: Diagnosis not present

## 2019-11-04 DIAGNOSIS — M19072 Primary osteoarthritis, left ankle and foot: Secondary | ICD-10-CM

## 2019-11-04 DIAGNOSIS — J8410 Pulmonary fibrosis, unspecified: Secondary | ICD-10-CM

## 2019-11-04 DIAGNOSIS — J849 Interstitial pulmonary disease, unspecified: Secondary | ICD-10-CM

## 2019-11-04 DIAGNOSIS — M791 Myalgia, unspecified site: Secondary | ICD-10-CM

## 2019-11-04 DIAGNOSIS — M19071 Primary osteoarthritis, right ankle and foot: Secondary | ICD-10-CM

## 2019-11-04 DIAGNOSIS — M1711 Unilateral primary osteoarthritis, right knee: Secondary | ICD-10-CM

## 2019-11-04 DIAGNOSIS — E7800 Pure hypercholesterolemia, unspecified: Secondary | ICD-10-CM | POA: Diagnosis not present

## 2019-11-04 DIAGNOSIS — Z79899 Other long term (current) drug therapy: Secondary | ICD-10-CM | POA: Diagnosis not present

## 2019-11-04 DIAGNOSIS — G8929 Other chronic pain: Secondary | ICD-10-CM

## 2019-11-04 DIAGNOSIS — I2584 Coronary atherosclerosis due to calcified coronary lesion: Secondary | ICD-10-CM

## 2019-11-04 DIAGNOSIS — T466X5A Adverse effect of antihyperlipidemic and antiarteriosclerotic drugs, initial encounter: Secondary | ICD-10-CM

## 2019-11-04 MED ORDER — AZATHIOPRINE 50 MG PO TABS: ORAL_TABLET | ORAL | 0 refills | Status: DC

## 2019-11-04 NOTE — Progress Notes (Signed)
Daily Session Note  Patient Details  Name: Lawrence Wells MRN: [id number] Date of Birth: 1955/08/22 Referring Provider:     Pulmonary Rehab from 10/28/2019 in [facility] Cardiac and Pulmonary Rehab  Referring Provider  Brand Males MD      Encounter Date: 11/04/2019  Check In: Session Check In - 11/04/19 0805      Check-In   Supervising physician immediately available to respond to emergencies  See telemetry face sheet for immediately available ER MD    Location  [facility]-Cardiac & Pulmonary Rehab    Staff Present  Heath Lark, RN, BSN, CCRP;Laureen Owens Shark, BS, RRT, CPFT;Amanda Oletta Darter, BA, ACSM CEP, Exercise Physiologist    Virtual Visit  No    Medication changes reported      No    Fall or balance concerns reported     No    Warm-up and Cool-down  Performed on first and last piece of equipment    Resistance Training Performed  Yes    VAD Patient?  No    PAD/SET Patient?  No      Pain Assessment   Currently in Pain?  No/denies          Social History   Tobacco Use  Smoking Status Former Smoker  . Packs/day: 1.50  . Years: 30.00  . Pack years: 45.00  . Types: Cigarettes  . Quit date: 2007  . Years since quitting: 14.2  Smokeless Tobacco Former Systems developer  . Types: Chew  . Quit date: 2007  Tobacco Comment   Dip smokeless tobacco >20-30 years    Goals Met:  Proper associated with RPD/PD & O2 Sat Exercise tolerated well No report of cardiac concerns or symptoms  Goals Unmet:  Not Applicable  Comments: Pt able to follow exercise prescription today without complaint.  Will continue to monitor for progression.    Dr. Emily Filbert is Medical Director for [place] and [facility].

## 2019-11-04 NOTE — Patient Instructions (Signed)
Standing Labs We placed an order today for your standing lab work.    Please come back and get your standing labs in 2 weeks and 4 weeks.   We have open lab daily Monday through Thursday from 8:30-12:30 PM and 1:30-4:30 PM and Friday from 8:30-12:30 PM and 1:30-4:00 PM at the office of Dr. Bo Merino.   You may experience shorter wait times on Monday and Friday afternoons. The office is located at [address] [place], [place], [place] [address] No appointment is necessary.   Labs are drawn by [facility].  You may receive a bill from [facility] for your lab work.  If you wish to have your labs drawn at another location, please call the office 24 hours in advance to send orders.  If you have any questions regarding directions or hours of operation,  please call [phone].   Just as a reminder please drink plenty of water prior to coming for your lab work. Thanks!    Azathioprine tablets What is this medicine? AZATHIOPRINE (ay za THYE oh preen) suppresses the immune system. It is used to prevent organ rejection after a transplant. It is also used to treat rheumatoid arthritis. This medicine may be used for other purposes; ask your health care provider or pharmacist if you have questions. COMMON BRAND NAME(S): Azasan, Imuran What should I tell my health care provider before I take this medicine? They need to know if you have any of these conditions:  infection  kidney disease  liver disease  an unusual or allergic reaction to azathioprine, other medicines, lactose, foods, dyes, or preservatives  pregnant or trying to get pregnant  breast feeding How should I use this medicine? Take this medicine by mouth with a full glass of water. Follow the directions on the prescription label. Take your medicine at regular intervals. Do not take your medicine more often than directed. Continue to take your medicine even if you feel better. Do not stop taking except on your doctor's  advice. Talk to your pediatrician regarding the use of this medicine in children. Special care may be needed. Overdosage: If you think you have taken too much of this medicine contact a poison control center or emergency room at once. NOTE: This medicine is only for you. Do not share this medicine with others. What if I miss a dose? If you miss a dose, take it as soon as you can. If it is almost time for your next dose, take only that dose. Do not take double or extra doses. What may interact with this medicine? Do not take this medicine with any of the following medications:  febuxostat  mercaptopurine This medicine may also interact with the following medications:  allopurinol  aminosalicylates like sulfasalazine, mesalamine, balsalazide, and olsalazine  leflunomide  medicines called ACE inhibitors like benazepril, captopril, enalapril, fosinopril, quinapril, lisinopril, ramipril, and trandolapril  mycophenolate  sulfamethoxazole; trimethoprim  vaccines  warfarin This list may not describe all possible interactions. Give your health care provider a list of all the medicines, herbs, non-prescription drugs, or dietary supplements you use. Also tell them if you smoke, drink alcohol, or use illegal drugs. Some items may interact with your medicine. What should I watch for while using this medicine? Visit your doctor or health care professional for regular checks on your progress. You will need frequent blood checks during the first few months you are receiving the medicine. If you get a cold or other infection while receiving this medicine, call your doctor  or health care professional. Do not treat yourself. The medicine may increase your risk of getting an infection. Women should inform their doctor if they wish to become pregnant or think they might be pregnant. There is a potential for serious side effects to an unborn child. Talk to your health care professional or pharmacist for  more information. Men may have a reduced sperm count while they are taking this medicine. Talk to your health care professional for more information. This medicine may increase your risk of getting certain kinds of cancer. Talk to your doctor about healthy lifestyle choices, important screenings, and your risk. What side effects may I notice from receiving this medicine? Side effects that you should report to your doctor or health care professional as soon as possible:  allergic reactions like skin rash, itching or hives, swelling of the face, lips, or tongue  changes in vision  confusion  fever, chills, or any other sign of infection  loss of balance or coordination  severe stomach pain  unusual bleeding, bruising  unusually weak or tired  vomiting  yellowing of the eyes or skin Side effects that usually do not require medical attention (report to your doctor or health care professional if they continue or are bothersome):  hair loss  nausea This list may not describe all possible side effects. Call your doctor for medical advice about side effects. You may report side effects to FDA at 1-800-FDA-1088. Where should I keep my medicine? Keep out of the reach of children. Store at room temperature between 15 and 25 degrees C (59 and 77 degrees F). Protect from light. Throw away any unused medicine after the expiration date. NOTE: This sheet is a summary. It may not cover all possible information. If you have questions about this medicine, talk to your doctor, pharmacist, or health care provider.  2020 [employer] Standard (2013-11-30 12:00:31)

## 2019-11-08 ENCOUNTER — Ambulatory Visit: Payer: Self-pay | Admitting: Rheumatology

## 2019-11-09 DIAGNOSIS — J849 Interstitial pulmonary disease, unspecified: Secondary | ICD-10-CM | POA: Diagnosis not present

## 2019-11-10 ENCOUNTER — Encounter: Payer: PPO | Admitting: *Deleted

## 2019-11-10 ENCOUNTER — Other Ambulatory Visit: Payer: Self-pay

## 2019-11-10 ENCOUNTER — Encounter: Payer: Self-pay | Admitting: *Deleted

## 2019-11-10 DIAGNOSIS — J849 Interstitial pulmonary disease, unspecified: Secondary | ICD-10-CM | POA: Diagnosis not present

## 2019-11-10 NOTE — Progress Notes (Signed)
Pulmonary Individual Treatment Plan  Patient Details  Name: PRAJWAL FELLNER MRN: [id number] Date of Birth: 02/19/1956 Referring Provider:     Pulmonary Rehab from 10/28/2019 in [facility] Cardiac and Pulmonary Rehab  Referring Provider  Brand Males MD      Initial Encounter Date:    Pulmonary Rehab from 10/28/2019 in [facility] Cardiac and Pulmonary Rehab  Date  10/28/19      Visit Diagnosis: ILD (interstitial lung disease) ([place])  Patient's Home Medications on Admission:  Current Outpatient Medications:  .  albuterol (VENTOLIN HFA) 108 (90 Base) MCG/ACT inhaler, Inhale 2 puffs into the lungs every 4 (four) hours as needed for wheezing or shortness of breath (cough). (Patient not taking: Reported on 10/27/2019), Disp: 8 g, Rfl: 2 .  azaTHIOprine (IMURAN) 50 MG tablet, Take 1 tablet by mouth daily for 2 weeks, if labs are stable, increase to 2 tablets by mouth daily., Disp: 42 tablet, Rfl: 0 .  diclofenac Sodium (VOLTAREN) 1 % GEL, Apply 2 g topically 3 (three) times daily as needed., Disp: 100 g, Rfl: 2 .  ezetimibe (ZETIA) 10 MG tablet, Take 1 tablet (10 mg total) by mouth daily., Disp: 90 tablet, Rfl: 3 .  gabapentin (NEURONTIN) 100 MG capsule, Start 1 capsule daily, increase by 1 cap every 2-3 days as tolerated up to 3 times a day, or may take 3 at once in evening., Disp: 90 capsule, Rfl: 3 .  Multiple Vitamin (MULTIVITAMIN) tablet, Take 1 tablet by mouth daily., Disp: , Rfl:  .  naproxen (NAPROSYN) 500 MG tablet, Take 1 tablet (500 mg total) by mouth 2 (two) times daily with a meal. For 2-4 weeks then as needed, Disp: 60 tablet, Rfl: 2 .  Nintedanib (OFEV) 150 MG CAPS, Take 1 capsule (150 mg total) by mouth 2 (two) times daily., Disp: 60 capsule, Rfl: 2 .  OXYGEN, Inhale into the lungs as needed., Disp: , Rfl:  .  predniSONE (DELTASONE) 5 MG tablet, Take 2 tablets (10 mg total) by mouth daily with breakfast for 14 days, THEN 1.5 tablets (7.5 mg total) daily with breakfast for 14 days, THEN 1  tablet (5 mg total) daily with breakfast for 14 days, THEN 0.5 tablets (2.5 mg total) daily with breakfast for 14 days., Disp: 70 tablet, Rfl: 0 .  Tiotropium Bromide Monohydrate (SPIRIVA RESPIMAT) 2.5 MCG/ACT AERS, Inhale 2 puffs into the lungs daily., Disp: 1 g, Rfl: 5  Past Medical History: Past Medical History:  Diagnosis Date  . Arthritis   . COPD (chronic obstructive pulmonary disease) ([place])   . Coronary artery disease   . Dyspnea   . Emphysema lung ([place])   . Hyperlipidemia   . Pulmonary filariasis     Tobacco Use: Social History   Tobacco Use  Smoking Status Former Smoker  . Packs/day: 1.50  . Years: 30.00  . Pack years: 45.00  . Types: Cigarettes  . Quit date: 2007  . Years since quitting: 14.2  Smokeless Tobacco Former Systems developer  . Types: Chew  . Quit date: 2007  Tobacco Comment   Dip smokeless tobacco >20-30 years    Labs: Recent Review Flowsheet Data    Labs for ITP Cardiac and Pulmonary Rehab Latest Ref Rng & Units 05/07/2017 09/02/2019   Cholestrol <200 mg/dL 259(H) 210(H)   LDLCALC mg/dL (calc) 156(H) 134(H)   HDL > OR = 40 mg/dL 76 51   Trlycerides <150 mg/dL 138 139   Hemoglobin A1c <5.7 % of total Hgb 5.5 5.7(H)  Pulmonary Assessment Scores: Pulmonary Assessment Scores    Row Name 10/27/19 1117         ADL UCSD   ADL Phase  Entry     SOB Score total  41     Rest  0     Walk  3     Stairs  4     Bath  2     Dress  2     Shop  1       CAT Score   CAT Score  15        UCSD: Self-administered rating of dyspnea associated with activities of daily living (ADLs) 6-point scale (0 = "not at all" to 5 = "maximal or unable to do because of breathlessness")  Scoring Scores range from 0 to 120.  Minimally important difference is 5 units  CAT: CAT can identify the health impairment of COPD patients and is better correlated with disease progression.  CAT has a scoring range of zero to 40. The CAT score is classified into four groups of low (less  than 10), medium (10 - 20), high (21-30) and very high (31-40) based on the impact level of disease on health status. A CAT score over 10 suggests significant symptoms.  A worsening CAT score could be explained by an exacerbation, poor medication adherence, poor inhaler technique, or progression of COPD or comorbid conditions.  CAT MCID is 2 points  mMRC: mMRC (Modified Medical Research Council) Dyspnea Scale is used to assess the degree of baseline functional disability in patients of respiratory disease due to dyspnea. No minimal important difference is established. A decrease in score of 1 point or greater is considered a positive change.   Pulmonary Function Assessment: Pulmonary Function Assessment - 10/27/19 1118      Breath   Shortness of Breath  Yes;Limiting activity       Exercise Target Goals: Exercise Program Goal: Individual exercise prescription set using results from initial 6 min walk test and THRR while considering  patient's activity barriers and safety.   Exercise Prescription Goal: Initial exercise prescription builds to 30-45 minutes a day of aerobic activity, 2-3 days per week.  Home exercise guidelines will be given to patient during program as part of exercise prescription that the participant will acknowledge.  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 HR  95 bpm     Max Ex. BP  146/72     2 Minute Post BP  134/70       Interval HR   1 Minute HR  82     2 Minute HR  89     3 Minute HR  89     4 Minute HR  95     5 Minute HR  92     6 Minute HR  93     2 Minute Post HR  67     Interval Heart Rate?  Yes       Interval Oxygen   Interval Oxygen?  Yes     Baseline Oxygen Saturation %  95 %     1 Minute Oxygen Saturation %  89 %     1 Minute Liters of Oxygen  0 L Room Air     2 Minute Oxygen Saturation %  86 %     2 Minute Liters of Oxygen  0 L     3 Minute Oxygen Saturation %  82 %     3 Minute Liters of Oxygen  0 L     4 Minute Oxygen Saturation %  81 %     4 Minute Liters of Oxygen  0 L     5 Minute Oxygen Saturation %  80 %     5 Minute Liters of Oxygen  0 L     6 Minute Oxygen Saturation %  79 %     6 Minute Liters of Oxygen  0 L     2 Minute Post Oxygen Saturation %  91 %     2 Minute Post Liters of Oxygen  0 L       Oxygen Initial Assessment: Oxygen Initial Assessment - 11/03/19 0835      Home Oxygen   Home Oxygen Device  Home Concentrator;E-Tanks    Sleep Oxygen Prescription  Continuous    Liters per minute  2    Home Exercise Oxygen Prescription  Continuous    Liters per minute  2    Home at Rest Exercise Oxygen Prescription  None    Liters per minute  0    Compliance with Home Oxygen Use  Yes       Oxygen Re-Evaluation: Oxygen Re-Evaluation    Row Name 11/03/19 0843             Program Oxygen Prescription   Program Oxygen Prescription  E-Tanks;Continuous       Liters per minute  2       Comments  Required oxygen 2 L  while exercising         Home Oxygen   Home Oxygen Device  Home Concentrator;E-Tanks       Sleep Oxygen Prescription  Continuous       Liters per minute  2       Home Exercise Oxygen Prescription  Continuous       Liters per minute   2       Home at Rest Exercise Oxygen Prescription  None       Liters per minute  0       Compliance with Home Oxygen Use  Yes         Goals/Expected Outcomes   Short Term Goals  To learn and exhibit compliance with exercise, home and travel O2 prescription;To learn and understand importance of monitoring SPO2 with pulse oximeter and demonstrate  accurate use of the pulse oximeter.;To learn and understand importance of maintaining oxygen saturations>88%;To learn and demonstrate proper pursed lip breathing techniques or other breathing techniques.       Long  Term Goals  Exhibits compliance with exercise, home and travel O2 prescription;Verbalizes importance of monitoring SPO2 with pulse oximeter and return demonstration;Maintenance of O2 saturations>88%;Exhibits proper breathing techniques, such as pursed lip breathing or other method taught during program session       Comments  Reviewed PLB technique with pt.  Talked about how it works and it's importance in maintaining their exercise saturations.       Goals/Expected Outcomes  Short: Become more profiecient at using PLB.   Long: Become independent at using PLB.          Oxygen Discharge (Final Oxygen Re-Evaluation): Oxygen Re-Evaluation - 11/03/19 0843      Program Oxygen Prescription   Program Oxygen Prescription  E-Tanks;Continuous    Liters per minute  2    Comments  Required oxygen 2 L  while exercising      Home Oxygen   Home Oxygen Device  Home Concentrator;E-Tanks    Sleep Oxygen Prescription  Continuous    Liters per minute  2    Home Exercise Oxygen Prescription  Continuous    Liters per minute  2    Home at Rest Exercise Oxygen Prescription  None    Liters per minute  0    Compliance with Home Oxygen Use  Yes      Goals/Expected Outcomes   Short Term Goals  To learn and exhibit compliance with exercise, home and travel O2 prescription;To learn and understand importance of monitoring SPO2 with pulse oximeter and  demonstrate accurate use of the pulse oximeter.;To learn and understand importance of maintaining oxygen saturations>88%;To learn and demonstrate proper pursed lip breathing techniques or other breathing techniques.    Long  Term Goals  Exhibits compliance with exercise, home and travel O2 prescription;Verbalizes importance of monitoring SPO2 with pulse oximeter and return demonstration;Maintenance of O2 saturations>88%;Exhibits proper breathing techniques, such as pursed lip breathing or other method taught during program session    Comments  Reviewed PLB technique with pt.  Talked about how it works and it's importance in maintaining their exercise saturations.    Goals/Expected Outcomes  Short: Become more profiecient at using PLB.   Long: Become independent at using PLB.       Initial Exercise Prescription: Initial Exercise Prescription - 10/28/19 1100      Date of Initial Exercise RX and Referring Provider   Date  10/28/19    Referring Provider  Brand Males MD      Oxygen   Oxygen  Continuous    Liters  2      Treadmill   MPH  2.4    Grade  0.5    Minutes  15    METs  3      NuStep   Level  3    SPM  80    Minutes  15    METs  3      Elliptical   Level  1    Speed  3    Minutes  15    METs  3      Biostep-RELP   Level  3    SPM  50    Minutes  15    METs  3      Prescription Details   Frequency (times per week)  2  Duration  Progress to 30 minutes of continuous aerobic without signs/symptoms of physical distress      Intensity   THRR 40-80% of Max Heartrate  98-138    Ratings of Perceived Exertion  11-13    Perceived Dyspnea  0-4      Progression   Progression  Continue to progress workloads to maintain intensity without signs/symptoms of physical distress.      Resistance Training   Training Prescription  Yes    Weight  5 lb    Reps  10-15       Perform Capillary Blood Glucose checks as needed.  Exercise Prescription Changes: Exercise  Prescription Changes    Row Name 10/28/19 1100             Response to Exercise   Blood Pressure (Admit)  130/74       Blood Pressure (Exercise)  146/72       Blood Pressure (Exit)  132/70       Heart Rate (Admit)  60 bpm       Heart Rate (Exercise)  95 bpm       Heart Rate (Exit)  62 bpm       Oxygen Saturation (Admit)  95 %       Oxygen Saturation (Exercise)  79 %       Oxygen Saturation (Exit)  95 %       Rating of Perceived Exertion (Exercise)  7       Perceived Dyspnea (Exercise)  1       Symptoms  none       Comments  walk test results          Exercise Comments: Exercise Comments    Row Name 11/03/19 0829 11/03/19 0830         Exercise Comments  First full day of exercise!  Patient was oriented to gym and equipment including functions, settings, policies, and procedures.  Patient's individual exercise prescription and treatment plan were reviewed.  All starting workloads were established based on the results of the 6 minute walk test done at initial orientation visit.  The plan for exercise progression was also introduced and progression will be customized based on patient's performance and goals.  Dropped to 80% on TM  Placed  2l per nasal canuula oxygen on pJohn   Sats back up above 90%         Exercise Goals and Review: Exercise Goals    Row Name 10/28/19 1144             Exercise Goals   Increase Physical Activity  Yes       Intervention  Provide advice, education, support and counseling about physical activity/exercise needs.;Develop an individualized exercise prescription for aerobic and resistive training based on initial evaluation findings, risk stratification, comorbidities and participant's personal goals.       Expected Outcomes  Short Term: Attend rehab on a regular basis to increase amount of physical activity.;Long Term: Add in home exercise to make exercise part of routine and to increase amount of physical activity.;Long Term: Exercising regularly  at least 3-5 days a week.       Increase Strength and Stamina  Yes       Intervention  Provide advice, education, support and counseling about physical activity/exercise needs.;Develop an individualized exercise prescription for aerobic and resistive training based on initial evaluation findings, risk stratification, comorbidities and participant's personal goals.       Expected Outcomes  Short  Term: Increase workloads from initial exercise prescription for resistance, speed, and METs.;Short Term: Perform resistance training exercises routinely during rehab and add in resistance training at home;Long Term: Improve cardiorespiratory fitness, muscular endurance and strength as measured by increased METs and functional capacity (6MWT)       Able to understand and use rate of perceived exertion (RPE) scale  Yes       Intervention  Provide education and explanation on how to use RPE scale       Expected Outcomes  Short Term: Able to use RPE daily in rehab to express subjective intensity level;Long Term:  Able to use RPE to guide intensity level when exercising independently       Able to understand and use Dyspnea scale  Yes       Intervention  Provide education and explanation on how to use Dyspnea scale       Expected Outcomes  Short Term: Able to use Dyspnea scale daily in rehab to express subjective sense of shortness of breath during exertion;Long Term: Able to use Dyspnea scale to guide intensity level when exercising independently       Knowledge and understanding of Target Heart Rate Range (THRR)  Yes       Intervention  Provide education and explanation of THRR including how the numbers were predicted and where they are located for reference       Expected Outcomes  Short Term: Able to state/look up THRR;Short Term: Able to use daily as guideline for intensity in rehab;Long Term: Able to use THRR to govern intensity when exercising independently       Able to check pulse independently  Yes        Intervention  Provide education and demonstration on how to check pulse in carotid and radial arteries.;Review the importance of being able to check your own pulse for safety during independent exercise       Expected Outcomes  Short Term: Able to explain why pulse checking is important during independent exercise;Long Term: Able to check pulse independently and accurately       Understanding of Exercise Prescription  Yes       Intervention  Provide education, explanation, and written materials on patient's individual exercise prescription       Expected Outcomes  Long Term: Able to explain home exercise prescription to exercise independently;Short Term: Able to explain program exercise prescription          Exercise Goals Re-Evaluation : Exercise Goals Re-Evaluation    [place] Name 11/03/19 [phone]             Exercise Goal Re-Evaluation   Exercise Goals Review  Knowledge and understanding of Target Heart Rate Range (THRR);Able to understand and use Dyspnea scale;Able to understand and use rate of perceived exertion (RPE) scale;Understanding of Exercise Prescription       Comments  Reviewed RPE scale, THR and program prescription with pt today.  Pt voiced understanding and was given a copy of goals to take home.       Expected Outcomes  Short: Use RPE daily to regulate intensity. Long: Follow program prescription in THR.          Discharge Exercise Prescription (Final Exercise Prescription Changes): Exercise Prescription Changes - 10/28/19 1100      Response to Exercise   Blood Pressure (Admit)  130/74    Blood Pressure (Exercise)  146/72    Blood Pressure (Exit)  132/70    Heart Rate (Admit)  60  bpm    Heart Rate (Exercise)  95 bpm    Heart Rate (Exit)  62 bpm    Oxygen Saturation (Admit)  95 %    Oxygen Saturation (Exercise)  79 %    Oxygen Saturation (Exit)  95 %    Rating of Perceived Exertion (Exercise)  7    Perceived Dyspnea (Exercise)  1    Symptoms  none    Comments  walk  test results       Nutrition:  Target Goals: Understanding of nutrition guidelines, daily intake of sodium <156m, cholesterol <2017m calories 30% from fat and 7% or less from saturated fats, daily to have 5 or more servings of fruits and vegetables.  Biometrics: Pre Biometrics - 10/28/19 1145      Pre Biometrics   Height  5' 10.75" (1.797 m)    Weight  168 lb 14.4 oz (76.6 kg)    BMI (Calculated)  23.73    Single Leg Stand  30 seconds        Nutrition Therapy Plan and Nutrition Goals:   Nutrition Assessments: Nutrition Assessments - 10/27/19 1118      MEDFICTS Scores   Pre Score  23       Nutrition Goals Re-Evaluation:   Nutrition Goals Discharge (Final Nutrition Goals Re-Evaluation):   Psychosocial: Target Goals: Acknowledge presence or absence of significant depression and/or stress, maximize coping skills, provide positive support system. Participant is able to verbalize types and ability to use techniques and skills needed for reducing stress and depression.   Initial Review & Psychosocial Screening: Initial Psych Review & Screening - 10/27/19 0846      Initial Review   Current issues with  Current Stress Concerns    Source of Stress Concerns  Chronic Illness;Unable to participate in former interests or hobbies;Unable to perform yard/household activities    Comments  Pulmonary Fibrosis and Arthritis keep him from doing everything that he wants      [place] Yes   both have several children, grandkids, neighbors     Barriers   Psychosocial barriers to participate in program  The patient should benefit from training in stress management and relaxation.;Psychosocial barriers identified (see note)      Screening Interventions   Interventions  Encouraged to exercise;To provide support and resources with identified psychosocial needs;Provide feedback about the scores to participant    Expected Outcomes  Short Term goal: Utilizing  psychosocial counselor, staff and physician to assist with identification of specific Stressors or current issues interfering with healing process. Setting desired goal for each stressor or current issue identified.;Long Term Goal: Stressors or current issues are controlled or eliminated.;Short Term goal: Identification and review with participant of any Quality of Life or Depression concerns found by scoring the questionnaire.;Long Term goal: The participant improves quality of Life and PHQ9 Scores as seen by post scores and/or verbalization of changes       Quality of Life Scores:  Scores of 19 and below usually indicate a poorer quality of life in these areas.  A difference of  2-3 points is a clinically meaningful difference.  A difference of 2-3 points in the total score of the Quality of Life Index has been associated with significant improvement in overall quality of life, self-image, physical symptoms, and general health in studies assessing change in quality of life.  PHQ-9: Recent Review Flowsheet Data    [facility] 10/28/2019 09/08/2019 07/19/2019 05/07/2017  Decreased Interest 1 0 0 0   Down, Depressed, Hopeless 0 0 0 0   PHQ - 2 Score 1 0 0 0   Altered sleeping 2 - - -   Tired, decreased energy 1 - - -   Change in appetite 0 - - -   Feeling bad or failure about yourself  1 - - -   Trouble concentrating 0 - - -   Moving slowly or fidgety/restless 1 - - -   Suicidal thoughts 2  - - -   PHQ-9 Score 8 - - -   Difficult doing work/chores Not difficult at all - - -     Interpretation of Total Score  Total Score Depression Severity:  1-4 = Minimal depression, 5-9 = Mild depression, 10-14 = Moderate depression, 15-19 = Moderately severe depression, 20-27 = Severe depression   Psychosocial Evaluation and Intervention: Psychosocial Evaluation - 10/27/19 0855      Psychosocial Evaluation & Interventions   Interventions  Stress management education;Encouraged to  exercise with the program and follow exercise prescription    Comments  Torrey 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  He has always been an avid exerciser at [employer] prior to the pandemic last year.  He has rhuematoid arthritis all over which also limits his ability to do things and keeps him in chronic pain.  He has a strong support system in his wife.  He is coming to the program after his doctor recommended that he should be supervised as he gets back into his exercise routine.  He wants to get back to exercise and feel better again.  He wants to be able to breathe and able to do more.    Expected Outcomes  Short: Attend rehab to get back into exercise routine.  Long: Continue to improve breathing.    Continue Psychosocial Services   Follow up required by staff       Psychosocial Re-Evaluation:   Psychosocial Discharge (Final Psychosocial Re-Evaluation):   Education: Education Goals: Education classes will be provided on a weekly basis, covering required topics. Participant will state understanding/return demonstration of topics presented.  Learning Barriers/Preferences: Learning Barriers/Preferences - 10/27/19 9702      Learning Barriers/Preferences   Learning Barriers  None    Learning Preferences  None       Education Topics:  Initial Evaluation Education: - Verbal, written and demonstration of respiratory meds, oximetry and breathing techniques. Instruction on use of nebulizers and MDIs and importance of monitoring MDI activations.   Pulmonary Rehab from 10/28/2019 in [facility] Cardiac and Pulmonary Rehab  Date  10/28/19  Educator  [facility]  Instruction Review Code  1- Verbalizes Understanding      General Nutrition Guidelines/Fats and Fiber: -Group instruction provided by verbal, written material, models and posters to present the general guidelines for heart healthy nutrition. Gives an explanation and review of dietary fats and fiber.   Controlling  Sodium/Reading Food Labels: -Group verbal and written material supporting the discussion of sodium use in heart healthy nutrition. Review and explanation with models, verbal and written materials for utilization of the food label.   Exercise Physiology & General Exercise Guidelines: - Group verbal and written instruction with models to review the exercise physiology of the cardiovascular system and associated critical values. Provides general exercise guidelines with specific guidelines to those with heart or lung disease.    Aerobic Exercise & Resistance Training: - Gives group verbal and written instruction on the various  components of exercise. Focuses on aerobic and resistive training programs and the benefits of this training and how to safely progress through these programs.   Flexibility, Balance, Mind/Body Relaxation: Provides group verbal/written instruction on the benefits of flexibility and balance training, including mind/body exercise modes such as yoga, pilates and tai chi.  Demonstration and skill practice provided.   Stress and Anxiety: - Provides group verbal and written instruction about the health risks of elevated stress and causes of high stress.  Discuss the correlation between heart/lung disease and anxiety and treatment options. Review healthy ways to manage with stress and anxiety.   Depression: - Provides group verbal and written instruction on the correlation between heart/lung disease and depressed mood, treatment options, and the stigmas associated with seeking treatment.   Exercise & Equipment Safety: - Individual verbal instruction and demonstration of equipment use and safety with use of the equipment.   Pulmonary Rehab from 10/28/2019 in [facility] Cardiac and Pulmonary Rehab  Date  10/28/19  Educator  [facility]  Instruction Review Code  1- Verbalizes Understanding      Infection Prevention: - Provides verbal and written material to individual with discussion of  infection control including proper hand washing and proper equipment cleaning during exercise session.   Pulmonary Rehab from 10/28/2019 in [facility] Cardiac and Pulmonary Rehab  Date  10/28/19  Educator  [facility]  Instruction Review Code  1- Verbalizes Understanding      Falls Prevention: - Provides verbal and written material to individual with discussion of falls prevention and safety.   Pulmonary Rehab from 10/28/2019 in [facility] Cardiac and Pulmonary Rehab  Date  10/28/19  Educator  [facility]  Instruction Review Code  1- Verbalizes Understanding      Diabetes: - Individual verbal and written instruction to review signs/symptoms of diabetes, desired ranges of glucose level fasting, after meals and with exercise. Advice that pre and post exercise glucose checks will be done for 3 sessions at entry of program.   Chronic Lung Diseases: - Group verbal and written instruction to review updates, respiratory medications, advancements in procedures and treatments. Discuss use of supplemental oxygen including available portable oxygen systems, continuous and intermittent flow rates, concentrators, personal use and safety guidelines. Review proper use of inhaler and spacers. Provide informative websites for self-education.    Energy Conservation: - Provide group verbal and written instruction for methods to conserve energy, plan and organize activities. Instruct on pacing techniques, use of adaptive equipment and posture/positioning to relieve shortness of breath.   Triggers and Exacerbations: - Group verbal and written instruction to review types of environmental triggers and ways to prevent exacerbations. Discuss weather changes, air quality and the benefits of nasal washing. Review warning signs and symptoms to help prevent infections. Discuss techniques for effective airway clearance, coughing, and vibrations.   AED/CPR: - Group verbal and written instruction with the use of models to demonstrate the basic  use of the AED with the basic ABC's of resuscitation.   Anatomy and Physiology of the Lungs: - Group verbal and written instruction with the use of models to provide basic lung anatomy and physiology related to function, structure and complications of lung disease.   Anatomy & Physiology of the Heart: - Group verbal and written instruction and models provide basic cardiac anatomy and physiology, with the coronary electrical and arterial systems. Review of Valvular disease and Heart Failure   Cardiac Medications: - Group verbal and written instruction to review commonly prescribed medications for heart disease. Reviews the medication,  class of the drug, and side effects.   Know Your Numbers and Risk Factors: -Group verbal and written instruction about important numbers in your health.  Discussion of what are risk factors and how they play a role in the disease process.  Review of Cholesterol, Blood Pressure, Diabetes, and BMI and the role they play in your overall health.   Sleep Hygiene: -Provides group verbal and written instruction about how sleep can affect your health.  Define sleep hygiene, discuss sleep cycles and impact of sleep habits. Review good sleep hygiene tips.    Other: -Provides group and verbal instruction on various topics (see comments)    Knowledge Questionnaire Score: Knowledge Questionnaire Score - 10/27/19 1118      Knowledge Questionnaire Score   Pre Score  15/18 Education Focus: O2 Safety and Exercise        Core Components/Risk Factors/Patient Goals at Admission: Personal Goals and Risk Factors at Admission - 10/28/19 1145      Core Components/Risk Factors/Patient Goals on Admission    Weight Management  Yes;Weight Maintenance    Intervention  Weight Management: Develop a combined nutrition and exercise program designed to reach desired caloric intake, while maintaining appropriate intake of nutrient and fiber, sodium and fats, and appropriate energy  expenditure required for the weight goal.;Weight Management: Provide education and appropriate resources to help participant work on and attain dietary goals.    Admit Weight  168 lb 14.4 oz (76.6 kg)    Goal Weight: Short Term  165 lb (74.8 kg)    Goal Weight: Long Term  165 lb (74.8 kg)    Expected Outcomes  Short Term: Continue to assess and modify interventions until short term weight is achieved;Long Term: Adherence to nutrition and physical activity/exercise program aimed toward attainment of established weight goal;Weight Maintenance: Understanding of the daily nutrition guidelines, which includes 25-35% calories from fat, 7% or less cal from saturated fats, less than 230m cholesterol, less than 1.5gm of sodium, & 5 or more servings of fruits and vegetables daily    Improve shortness of breath with ADL's  Yes    Intervention  Provide education, individualized exercise plan and daily activity instruction to help decrease symptoms of SOB with activities of daily living.    Expected Outcomes  Short Term: Improve cardiorespiratory fitness to achieve a reduction of symptoms when performing ADLs;Long Term: Be able to perform more ADLs without symptoms or delay the onset of symptoms    Lipids  Yes    Intervention  Provide education and support for participant on nutrition & aerobic/resistive exercise along with prescribed medications to achieve LDL <733m HDL >4031m   Expected Outcomes  Short Term: Participant states understanding of desired cholesterol values and is compliant with medications prescribed. Participant is following exercise prescription and nutrition guidelines.;Long Term: Cholesterol controlled with medications as prescribed, with individualized exercise RX and with personalized nutrition plan. Value goals: LDL < 62m57mDL > 40 mg.       Core Components/Risk Factors/Patient Goals Review:    Core Components/Risk Factors/Patient Goals at Discharge (Final Review):    ITP  Comments: ITP Comments    Row Name 10/27/19 0912 10/28/19 1107 11/03/19 0828 11/10/19 0552     ITP Comments  Completed virtual orientation today.  EP evaluation is scheduled for Thursday 3/11 at 930am.  [facility]  Completed 6MWT and gym orientation.  Initial ITP created and sent for review to Dr. MarkEmily Filbertdical  Director.  First full day of exercise!  Patient was oriented to gym and equipment including functions, settings, policies, and procedures.  Patient's individual exercise prescription and treatment plan were reviewed.  All starting workloads were established based on the results of the 6 minute walk test done at initial orientation visit.  The plan for exercise progression was also introduced and progression will be customized based on patient's performance and goals.  30 day chart review completed. ITP sent to Dr Zachery Dakins Medical Director, for review,changes as needed and signature. Continue with ITP if no changes requested       Comments:

## 2019-11-10 NOTE — Progress Notes (Signed)
Daily Session Note  Patient Details  Name: Lawrence Wells MRN: [id number] Date of Birth: 10-22-1955 Referring Provider:     Pulmonary Rehab from 10/28/2019 in [facility] Cardiac and Pulmonary Rehab  Referring Provider  Brand Males MD      Encounter Date: 11/10/2019  Check In: Session Check In - 11/10/19 0859      Check-In   Supervising physician immediately available to respond to emergencies  See telemetry face sheet for immediately available ER MD    Location  [facility]-Cardiac & Pulmonary Rehab    Staff Present  Heath Lark, RN, BSN, Lance Sell, BA, ACSM CEP, Exercise Physiologist;Joseph Hood RCP,RRT,BSRT    Virtual Visit  No    Medication changes reported      No    Fall or balance concerns reported     No    Warm-up and Cool-down  Performed on first and last piece of equipment    Resistance Training Performed  Yes    VAD Patient?  No    PAD/SET Patient?  No      Pain Assessment   Currently in Pain?  No/denies          Social History   Tobacco Use  Smoking Status Former Smoker  . Packs/day: 1.50  . Years: 30.00  . Pack years: 45.00  . Types: Cigarettes  . Quit date: 2007  . Years since quitting: 14.2  Smokeless Tobacco Former Systems developer  . Types: Chew  . Quit date: 2007  Tobacco Comment   Dip smokeless tobacco >20-30 years    Goals Met:  Independence with exercise equipment Exercise tolerated well No report of cardiac concerns or symptoms  Goals Unmet:  Not Applicable  Comments: Pt able to follow exercise prescription today without complaint.  Will continue to monitor for progression.    Dr. Emily Filbert is Medical Director for [place] and [facility].

## 2019-11-11 ENCOUNTER — Encounter: Payer: PPO | Admitting: *Deleted

## 2019-11-11 ENCOUNTER — Other Ambulatory Visit: Payer: Self-pay

## 2019-11-11 DIAGNOSIS — J849 Interstitial pulmonary disease, unspecified: Secondary | ICD-10-CM

## 2019-11-11 NOTE — Progress Notes (Signed)
Daily Session Note  Patient Details  Name: Lawrence Wells MRN: [id number] Date of Birth: April 23, 1956 Referring Provider:     Pulmonary Rehab from 10/28/2019 in [facility] Cardiac and Pulmonary Rehab  Referring Provider  Lawrence Males MD      Encounter Date: 11/11/2019  Check In: Session Check In - 11/11/19 0757      Check-In   Supervising physician immediately available to respond to emergencies  See telemetry face sheet for immediately available ER MD    Location  [facility]-Cardiac & Pulmonary Rehab    Staff Present  Lawrence Lark, RN, BSN, CCRP;Lawrence Washburn, MA, RCEP, CCRP, CCET    Virtual Visit  No    Medication changes reported      No    Fall or balance concerns reported     No    Warm-up and Cool-down  Performed on first and last piece of equipment    Resistance Training Performed  Yes    VAD Patient?  No    PAD/SET Patient?  No      Pain Assessment   Currently in Pain?  No/denies          Social History   Tobacco Use  Smoking Status Former Smoker  . Packs/day: 1.50  . Years: 30.00  . Pack years: 45.00  . Types: Cigarettes  . Quit date: 2007  . Years since quitting: 14.2  Smokeless Tobacco Former Systems developer  . Types: Chew  . Quit date: 2007  Tobacco Comment   Dip smokeless tobacco >20-30 years    Goals Met:  Proper associated with RPD/PD & O2 Sat Exercise tolerated well No report of cardiac concerns or symptoms  Goals Unmet:  Not Applicable  Comments: Pt able to follow exercise prescription today without complaint.  Will continue to monitor for progression.    Dr. Emily Filbert is Medical Director for [place] and [facility].

## 2019-11-17 ENCOUNTER — Encounter: Payer: PPO | Admitting: *Deleted

## 2019-11-17 ENCOUNTER — Other Ambulatory Visit: Payer: Self-pay

## 2019-11-17 DIAGNOSIS — J849 Interstitial pulmonary disease, unspecified: Secondary | ICD-10-CM

## 2019-11-17 NOTE — Progress Notes (Signed)
Daily Session Note  Patient Details  Name: TERRIN MEDDAUGH MRN: [id number] Date of Birth: 10/24/1955 Referring Provider:     Pulmonary Rehab from 10/28/2019 in [facility] Cardiac and Pulmonary Rehab  Referring Provider  Brand Males MD      Encounter Date: 11/17/2019  Check In: Session Check In - 11/17/19 0836      Check-In   Supervising physician immediately available to respond to emergencies  See telemetry face sheet for immediately available ER MD    Staff Present  Heath Lark, RN, BSN, CCRP;Melissa Caiola RDN, LDN;Joseph Hood RCP,RRT,BSRT    Virtual Visit  No    Medication changes reported      No    Fall or balance concerns reported     No    Warm-up and Cool-down  Performed on first and last piece of equipment    Resistance Training Performed  Yes    VAD Patient?  No    PAD/SET Patient?  No      Pain Assessment   Currently in Pain?  No/denies          Social History   Tobacco Use  Smoking Status Former Smoker  . Packs/day: 1.50  . Years: 30.00  . Pack years: 45.00  . Types: Cigarettes  . Quit date: 2007  . Years since quitting: 14.2  Smokeless Tobacco Former Systems developer  . Types: Chew  . Quit date: 2007  Tobacco Comment   Dip smokeless tobacco >20-30 years    Goals Met:  Proper associated with RPD/PD & O2 Sat Independence with exercise equipment Exercise tolerated well No report of cardiac concerns or symptoms  Goals Unmet:  Not Applicable  Comments: Pt able to follow exercise prescription today without complaint.  Will continue to monitor for progression.    Dr. Emily Filbert is Medical Director for [place] and [facility].

## 2019-11-18 ENCOUNTER — Other Ambulatory Visit: Payer: Self-pay

## 2019-11-18 ENCOUNTER — Encounter: Payer: PPO | Attending: Internal Medicine | Admitting: *Deleted

## 2019-11-18 ENCOUNTER — Other Ambulatory Visit: Payer: Self-pay | Admitting: *Deleted

## 2019-11-18 DIAGNOSIS — J439 Emphysema, unspecified: Secondary | ICD-10-CM | POA: Insufficient documentation

## 2019-11-18 DIAGNOSIS — Z79899 Other long term (current) drug therapy: Secondary | ICD-10-CM

## 2019-11-18 DIAGNOSIS — J849 Interstitial pulmonary disease, unspecified: Secondary | ICD-10-CM | POA: Insufficient documentation

## 2019-11-18 DIAGNOSIS — E785 Hyperlipidemia, unspecified: Secondary | ICD-10-CM | POA: Insufficient documentation

## 2019-11-18 DIAGNOSIS — Z7952 Long term (current) use of systemic steroids: Secondary | ICD-10-CM | POA: Insufficient documentation

## 2019-11-18 DIAGNOSIS — I2510 Atherosclerotic heart disease of native coronary artery without angina pectoris: Secondary | ICD-10-CM | POA: Diagnosis not present

## 2019-11-18 DIAGNOSIS — Z87891 Personal history of nicotine dependence: Secondary | ICD-10-CM | POA: Diagnosis not present

## 2019-11-18 DIAGNOSIS — M1990 Unspecified osteoarthritis, unspecified site: Secondary | ICD-10-CM | POA: Diagnosis not present

## 2019-11-18 LAB — CBC WITH DIFFERENTIAL/PLATELET
Absolute Monocytes: 1101 cells/uL — ABNORMAL HIGH (ref 200–950)
Basophils Absolute: 57 cells/uL (ref 0–200)
Basophils Relative: 0.4 %
Eosinophils Absolute: 215 cells/uL (ref 15–500)
Eosinophils Relative: 1.5 %
HCT: 49.4 % (ref 38.5–50.0)
Hemoglobin: 16.8 g/dL (ref 13.2–17.1)
Lymphs Abs: 4190 cells/uL — ABNORMAL HIGH (ref 850–3900)
MCH: 32.2 pg (ref 27.0–33.0)
MCHC: 34 g/dL (ref 32.0–36.0)
MCV: 94.8 fL (ref 80.0–100.0)
MPV: 9.6 fL (ref 7.5–12.5)
Monocytes Relative: 7.7 %
Neutro Abs: 8737 cells/uL — ABNORMAL HIGH (ref 1500–7800)
Neutrophils Relative %: 61.1 %
Platelets: 335 10*3/uL (ref 140–400)
RBC: 5.21 10*6/uL (ref 4.20–5.80)
RDW: 12.5 % (ref 11.0–15.0)
Total Lymphocyte: 29.3 %
WBC: 14.3 10*3/uL — ABNORMAL HIGH (ref 3.8–10.8)

## 2019-11-18 LAB — COMPLETE METABOLIC PANEL WITH GFR
AG Ratio: 1 (calc) (ref 1.0–2.5)
ALT: 18 U/L (ref 9–46)
AST: 20 U/L (ref 10–35)
Albumin: 3.5 g/dL — ABNORMAL LOW (ref 3.6–5.1)
Alkaline phosphatase (APISO): 97 U/L (ref 35–144)
BUN: 24 mg/dL (ref 7–25)
CO2: 26 mmol/L (ref 20–32)
Calcium: 9.4 mg/dL (ref 8.6–10.3)
Chloride: 102 mmol/L (ref 98–110)
Creat: 0.84 mg/dL (ref 0.70–1.25)
GFR, Est African American: 106 mL/min/{1.73_m2} (ref >=60)
GFR, Est Non African American: 92 mL/min/{1.73_m2} (ref >=60)
Globulin: 3.6 g/dL (calc) (ref 1.9–3.7)
Glucose, Bld: 74 mg/dL (ref 65–99)
Potassium: 4.8 mmol/L (ref 3.5–5.3)
Sodium: 140 mmol/L (ref 135–146)
Total Bilirubin: 0.4 mg/dL (ref 0.2–1.2)
Total Protein: 7.1 g/dL (ref 6.1–8.1)

## 2019-11-18 NOTE — Progress Notes (Signed)
Daily Session Note  Patient Details  Name: Lawrence Wells MRN: [id number] Date of Birth: 1956/07/03 Referring Provider:     Pulmonary Rehab from 10/28/2019 in [facility] Cardiac and Pulmonary Rehab  Referring Provider  Brand Males MD      Encounter Date: 11/18/2019  Check In: Session Check In - 11/18/19 0748      Check-In   Supervising physician immediately available to respond to emergencies  See telemetry face sheet for immediately available ER MD    Location  [facility]-Cardiac & Pulmonary Rehab    Staff Present  Nyoka Cowden, RN, BSN, [place], BS, RRT, CPFT;Susanne Bice, RN, BSN, CCRP    Virtual Visit  No    Medication changes reported      No    Fall or balance concerns reported     No    Warm-up and Cool-down  Performed on first and last piece of equipment    Resistance Training Performed  Yes    VAD Patient?  No    PAD/SET Patient?  No      Pain Assessment   Currently in Pain?  No/denies          Social History   Tobacco Use  Smoking Status Former Smoker  . Packs/day: 1.50  . Years: 30.00  . Pack years: 45.00  . Types: Cigarettes  . Quit date: 2007  . Years since quitting: 14.2  Smokeless Tobacco Former Systems developer  . Types: Chew  . Quit date: 2007  Tobacco Comment   Dip smokeless tobacco >20-30 years    Goals Met:  Independence with exercise equipment Exercise tolerated well No report of cardiac concerns or symptoms  Goals Unmet:  Not Applicable  Comments: Pt able to follow exercise prescription today without complaint.  Will continue to monitor for progression.   Dr. Emily Filbert is Medical Director for [place] and [facility].

## 2019-11-22 ENCOUNTER — Ambulatory Visit: Payer: PPO | Attending: Internal Medicine

## 2019-11-22 ENCOUNTER — Telehealth: Payer: Self-pay | Admitting: *Deleted

## 2019-11-22 DIAGNOSIS — Z23 Encounter for immunization: Secondary | ICD-10-CM

## 2019-11-22 NOTE — Telephone Encounter (Signed)
Contacted patient to discuss lab results. Patient states he is currently on Imuran 50 mg and per Dr. Estanislado Pandy okay for patient to increase to 100 mg. Patient states bilateral shoulders are hurting. Patient states his shoulders and arms ache when he wakes up and for several hours. Patient states then again around 5-6 pm they start tightening and hurting again. Patient states at the end of the day his feet and knees hurt as well. Patient states it is interfering with his sleep. Patient is inquiring about possible injections in his shoulders. Please advise.

## 2019-11-22 NOTE — Progress Notes (Signed)
WBC count is elevated because patient was on prednisone.  Most of other labs are normal.  Patient is on Imuran.

## 2019-11-22 NOTE — Telephone Encounter (Signed)
Patient advised he will require x-rays of both shoulder joints prior to having cortisone injections. Patient has been rescheduled for a sooner office visit to obtain x-rays and discuss treatment options for his shoulder joint pain.

## 2019-11-22 NOTE — Telephone Encounter (Signed)
He will require x-rays of both shoulder joints prior to having cortisone injections. Please see if the patient wants to reschedule for a sooner office visit to obtain x-rays and discuss treatment options for his shoulder joint pain.

## 2019-11-22 NOTE — Progress Notes (Signed)
   Covid-19 Vaccination Clinic  Name:  Lawrence Wells    MRN: [id number] DOB: Jun 14, 1956  11/22/2019  [name] was observed post Covid-19 immunization for 15 minutes without incident. He was provided with Vaccine Information Sheet and instruction to access the V-Safe system.   [name] was instructed to call 911 with any severe reactions post vaccine: Marland Kitchen Difficulty breathing  . Swelling of face and throat  . A fast heartbeat  . A bad rash all over body  . Dizziness and weakness   Immunizations Administered    Name Date Dose VIS Date Route   Pfizer COVID-19 Vaccine 11/22/2019  9:40 AM 0.3 mL 07/30/2019 Intramuscular   Manufacturer: [place]   Lot: [id number]   [place]: [id number]

## 2019-11-23 ENCOUNTER — Ambulatory Visit (INDEPENDENT_AMBULATORY_CARE_PROVIDER_SITE_OTHER): Payer: PPO | Admitting: Physician Assistant

## 2019-11-23 ENCOUNTER — Ambulatory Visit: Payer: Self-pay

## 2019-11-23 ENCOUNTER — Encounter: Payer: Self-pay | Admitting: Physician Assistant

## 2019-11-23 ENCOUNTER — Other Ambulatory Visit: Payer: Self-pay

## 2019-11-23 VITALS — BP 120/75 | HR 62 | Resp 18 | Ht 71.0 in | Wt 171.6 lb

## 2019-11-23 DIAGNOSIS — M19071 Primary osteoarthritis, right ankle and foot: Secondary | ICD-10-CM

## 2019-11-23 DIAGNOSIS — Z87891 Personal history of nicotine dependence: Secondary | ICD-10-CM | POA: Diagnosis not present

## 2019-11-23 DIAGNOSIS — M1711 Unilateral primary osteoarthritis, right knee: Secondary | ICD-10-CM

## 2019-11-23 DIAGNOSIS — Z79899 Other long term (current) drug therapy: Secondary | ICD-10-CM | POA: Diagnosis not present

## 2019-11-23 DIAGNOSIS — I2510 Atherosclerotic heart disease of native coronary artery without angina pectoris: Secondary | ICD-10-CM

## 2019-11-23 DIAGNOSIS — M0579 Rheumatoid arthritis with rheumatoid factor of multiple sites without organ or systems involvement: Secondary | ICD-10-CM

## 2019-11-23 DIAGNOSIS — J849 Interstitial pulmonary disease, unspecified: Secondary | ICD-10-CM | POA: Diagnosis not present

## 2019-11-23 DIAGNOSIS — E7800 Pure hypercholesterolemia, unspecified: Secondary | ICD-10-CM | POA: Diagnosis not present

## 2019-11-23 DIAGNOSIS — J8410 Pulmonary fibrosis, unspecified: Secondary | ICD-10-CM | POA: Diagnosis not present

## 2019-11-23 DIAGNOSIS — M791 Myalgia, unspecified site: Secondary | ICD-10-CM

## 2019-11-23 DIAGNOSIS — M25512 Pain in left shoulder: Secondary | ICD-10-CM | POA: Diagnosis not present

## 2019-11-23 DIAGNOSIS — M19072 Primary osteoarthritis, left ankle and foot: Secondary | ICD-10-CM

## 2019-11-23 DIAGNOSIS — M25511 Pain in right shoulder: Secondary | ICD-10-CM | POA: Diagnosis not present

## 2019-11-23 DIAGNOSIS — I2584 Coronary atherosclerosis due to calcified coronary lesion: Secondary | ICD-10-CM

## 2019-11-23 DIAGNOSIS — T466X5A Adverse effect of antihyperlipidemic and antiarteriosclerotic drugs, initial encounter: Secondary | ICD-10-CM

## 2019-11-23 DIAGNOSIS — G8929 Other chronic pain: Secondary | ICD-10-CM

## 2019-11-23 MED ORDER — TRIAMCINOLONE ACETONIDE 40 MG/ML IJ SUSP
40.0000 mg | INTRAMUSCULAR | Status: AC | PRN
  Administered 2019-11-23: 40 mg via INTRA_ARTICULAR

## 2019-11-23 MED ORDER — LIDOCAINE HCL 1 % IJ SOLN
1.5000 mL | INTRAMUSCULAR | Status: AC | PRN
  Administered 2019-11-23: 1.5 mL

## 2019-11-23 NOTE — Progress Notes (Signed)
Office Visit Note  Patient: Lawrence Wells             Date of Birth: Dec 21, 1955           MRN: [id number]             PCP: Olin Hauser, DO Referring: Nobie Putnam * Visit Date: 11/23/2019 Occupation: @GUAROCC @  Subjective:  Pain in both shoulders  History of Present Illness: Lawrence Wells is a 64 y.o. male with history of seropositive rheumatoid arthritis, ILD, and osteoarthritis.  He was started on Imuran 50 mg 1 tablet once weekly about 2 weeks ago and increased to 100 mg yesterday. He has been tolerating imuran without any side effects. He continues to take the prednisone taper as prescribed.  He is unsure of what dose he is taking currently.  He presents today with pain in both shoulders, both elbows, both wrists, and both hands. He denies any joint swelling.  He states he has muscle tenderness in bilateral upper extremities.   He continues to have shortness of breath and is going to pulmonary rehab twice weekly. He is taking Ofev 150 mg 1 capsule twice daily. He is followed by Dr. Chase Caller.     Activities of Daily Living:  Patient reports morning stiffness for 3 hours.   Patient Reports nocturnal pain.  Difficulty dressing/grooming: Reports Difficulty climbing stairs: Denies Difficulty getting out of chair: Reports Difficulty using hands for taps, buttons, cutlery, and/or writing: Reports  Review of Systems  Constitutional: Positive for fatigue. Negative for night sweats.  HENT: Negative for mouth sores, mouth dryness and nose dryness.   Eyes: Positive for dryness. Negative for redness.  Respiratory: Positive for shortness of breath (Going to pulm rehab). Negative for difficulty breathing.   Cardiovascular: Negative for chest pain, palpitations, hypertension, irregular heartbeat and swelling in legs/feet.  Gastrointestinal: Negative for constipation and diarrhea.  Endocrine: Negative for increased urination.  Genitourinary: Negative for difficulty  urinating and painful urination.  Musculoskeletal: Positive for arthralgias, joint pain, joint swelling, muscle weakness, morning stiffness and muscle tenderness. Negative for myalgias and myalgias.  Skin: Negative for color change, rash, hair loss, nodules/bumps, skin tightness, ulcers and sensitivity to sunlight.  Allergic/Immunologic: Negative for susceptible to infections.  Neurological: Negative for dizziness, fainting, memory loss and night sweats.  Hematological: Negative for swollen glands.  Psychiatric/Behavioral: Positive for sleep disturbance. Negative for depressed mood. The patient is not nervous/anxious.     PMFS History:  Patient Active Problem List   Diagnosis Date Noted  . Coronary artery calcification 09/26/2019  . Pulmonary fibrosis ([place]) 09/08/2019  . Myalgia due to statin 09/08/2019  . Chronic hip pain, bilateral 06/09/2017  . Chronic bilateral low back pain with bilateral sciatica 06/09/2017  . Osteoarthritis of multiple joints 05/07/2017  . Genital herpes 05/07/2017  . Screening for prostate cancer 05/07/2017  . Hypercholesterolemia 03/30/2003  . Former smoker 02/26/1995    Past Medical History:  Diagnosis Date  . Arthritis   . COPD (chronic obstructive pulmonary disease) ([place])   . Coronary artery disease   . Dyspnea   . Emphysema lung ([place])   . Hyperlipidemia   . Pulmonary filariasis     Family History  Problem Relation Age of Onset  . Heart disease Mother   . Heart attack Mother 8  . Arthritis Mother   . Heart disease Father   . Heart attack Father 82  . Healthy Sister   . Hyperlipidemia Brother   . Throat cancer Brother  58  . Heart disease Brother   . Healthy Son   . Healthy Daughter    Past Surgical History:  Procedure Laterality Date  . SINUS SURGERY WITH INSTATRAK     Social History   Social History Narrative  . Not on file   Immunization History  Administered Date(s) Administered  . PFIZER SARS-COV-2 Vaccination 11/22/2019  .  Pneumococcal Conjugate-13 09/08/2019  . Tdap 04/19/2015, 05/07/2017     Objective: Vital Signs: BP 120/75 (BP Location: Left Arm, Patient Position: Sitting, Cuff Size: Normal)   Pulse 62   Resp 18   Ht 5\' 11"  (1.803 m)   Wt 171 lb 9.6 oz (77.8 kg)   BMI 23.93 kg/m    Physical Exam Vitals and nursing note reviewed.  Constitutional:      Appearance: He is well-developed.  HENT:     Head: Normocephalic and atraumatic.  Eyes:     Conjunctiva/sclera: Conjunctivae normal.     Pupils: Pupils are equal, round, and reactive to light.  Pulmonary:     Effort: Pulmonary effort is normal.  Abdominal:     General: Bowel sounds are normal.     Palpations: Abdomen is soft.  Musculoskeletal:     Cervical back: Normal range of motion and neck supple.  Skin:    General: Skin is warm and dry.     Capillary Refill: Capillary refill takes less than 2 seconds.  Neurological:     Mental Status: He is alert and oriented to person, place, and time.  Psychiatric:        Behavior: Behavior normal.      Musculoskeletal Exam: C-spine limited ROM with lateral rotation.  Thoracic and lumbar spine good ROM.  Shoulder joints good ROM with discomfort bilaterally.  Tenderness of both shoulders noted.  Elbow joints, wrist joints, MCPs, PIPs, and DIPs good ROM with no synovitis.  Tenderness of the right 3rd PIP joint. Hip joints, knee joints, ankle joints good ROM with no synovitis.  No warmth or effusion of knee joints.  No tenderness or swelling of ankle joints.    CDAI Exam: CDAI Score: -- Patient Global: --; Provider Global: -- Swollen: --; Tender: -- Joint Exam 11/23/2019   No joint exam has been documented for this visit   There is currently no information documented on the homunculus. Go to the Rheumatology activity and complete the homunculus joint exam.  Investigation: No additional findings.  Imaging: XR Shoulder Left  Result Date: 11/23/2019 No glenohumeral joint space narrowing was  noted.  Acromioclavicular joint narrowing was noted.  No chondrocalcinosis was noted. Impression: These findings are consistent with acromioclavicular arthritis of the shoulder.  XR Shoulder Right  Result Date: 11/23/2019 No glenohumeral joint space narrowing was noted.  Acromioclavicular joint narrowing was noted.  No chondrocalcinosis was noted. Impression: These findings are consistent with acromioclavicular arthritis of the shoulder.   Recent Labs: Lab Results  Component Value Date   WBC 14.3 (H) 11/18/2019   HGB 16.8 11/18/2019   PLT 335 11/18/2019   NA 140 11/18/2019   K 4.8 11/18/2019   CL 102 11/18/2019   CO2 26 11/18/2019   GLUCOSE 74 11/18/2019   BUN 24 11/18/2019   CREATININE 0.84 11/18/2019   BILITOT 0.4 11/18/2019   ALKPHOS 104 10/22/2019   AST 20 11/18/2019   ALT 18 11/18/2019   PROT 7.1 11/18/2019   ALBUMIN 4.1 10/22/2019   CALCIUM 9.4 11/18/2019   GFRAA 106 11/18/2019   QFTBGOLDPLUS NEGATIVE 10/21/2019  Speciality Comments: No specialty comments available.  Procedures:  Large Joint Inj: bilateral glenohumeral on 11/23/2019 12:14 PM Indications: pain Details: 27 G 1.5 in needle, posterior approach  Arthrogram: No  Medications (Right): 1.5 mL lidocaine 1 %; 40 mg triamcinolone acetonide 40 MG/ML Medications (Left): 1.5 mL lidocaine 1 %; 40 mg triamcinolone acetonide 40 MG/ML Outcome: tolerated well, no immediate complications Procedure, treatment alternatives, risks and benefits explained, specific risks discussed. Consent was given by the patient. Immediately prior to procedure a time out was called to verify the correct patient, procedure, equipment, support staff and site/side marked as required. Patient was prepped and draped in the usual sterile fashion.     Allergies: Atorvastatin   Assessment / Plan:     Visit Diagnoses: Rheumatoid arthritis involving multiple sites with positive rheumatoid factor (HCC) - Positive rheumatoid factor, anti-CCP  negative, positive ANA, positive Ro.  Positive synovitis: He has no synovitis on exam.  He presents today with pain in both shoulder joints.  He has good ROM with discomfort and stiffness bilaterally.  X-rays of both shoulders were obtained today, which revealed AC joint narrowing bilaterally.  Bilateral shoulder joints were injected with cortisone.  He has tenderness of the right 3rd PIP joint.  He will continue taking Imuran 100 mg po daily.  He will return for lab work in 2 weeks. He will complete the prednisone taper as prescribed.    He will follow up in 6 weeks.   High risk medication use - Imuran 100 mg po daily.  He is tolerating Imuran without any side effects. CBC and CMP were stable on 11/18/19.  WBC count is elevated due to prednisone use.  He will return for lab work in 2 weeks. Standing orders are in place.   ILD (interstitial lung disease) ([place]) - Followed by Dr. Chase Caller.  He is taking Ofev 150 mg 1 capsule twice daily.  He continues to go to pulmonary rehab twice weekly.   Chronic pain of both shoulders - He presents today with bilateral shoulder joint pain.  He has been experiencing morning stiffness lasting 2-3 hours as well as nocturnal pain in both shoulder joints.  He has good ROM with discomfort bilaterally.  Tenderness but no effusion noted on exam.  X-rays of both shoulders were updated today.  He requested bilateral shoulder joint cortisone injections.  He tolerated the procedure well.  Procedure notes completed above.  Aftercare was discussed. Plan: XR Shoulder Right, XR Shoulder Left  Primary osteoarthritis of right knee: He has good ROM with no discomfort at this time.  No warmth or effusion noted.   Primary osteoarthritis of both feet: He is not having any pain in his feet currently.  He wears proper fitting shoes.   Pulmonary fibrosis [facility]): He is taking Ofev as prescribed by Dr. Chase Caller.   Other medical conditions are listed as follows:   Former smoker  Coronary  artery calcification  Myalgia due to statin  Hypercholesterolemia    Orders: Orders Placed This Encounter  Procedures  . Large Joint Inj  . XR Shoulder Right  . XR Shoulder Left   No orders of the defined types were placed in this encounter.   Face-to-face time spent with patient was 30 minutes. Greater than 50% of time was spent in counseling and coordination of care.  Follow-Up Instructions: Return in about 4 weeks (around 12/21/2019).   Hazel Sams, PA-C  I examined and evaluated the patient with Hazel Sams PA.  Patient has been experiencing  discomfort in his bilateral shoulders.  The x-rays reviewed today showed acromioclavicular arthritis.  Also he was having discomfort with range of motion of the shoulder joints.  After informed consent was obtained bilateral shoulder joints were injected with cortisone as described above.  He tolerated the procedure well.  We will see response to increased dose of Imuran.  The plan of care was discussed as noted above.  Lawrence Merino, MD  Note - This record has been created using Editor, commissioning.  Chart creation errors have been sought, but may not always  have been located. Such creation errors do not reflect on  the standard of medical care.

## 2019-11-23 NOTE — Patient Instructions (Signed)
Standing Labs We placed an order today for your standing lab work.    Please come back and get your standing labs in 2 weeks  We have open lab daily Monday through Thursday from 8:30-12:30 PM and 1:30-4:30 PM and Friday from 8:30-12:30 PM and 1:30 -4:00 PM at the office of Dr. Shaili Deveshwar.   You may experience shorter wait times on Monday and Friday afternoons. The office is located at [address], [address], [place], [place] [address] No appointment is necessary.   Labs are drawn by [facility].  You may receive a bill from [facility] for your lab work.  If you wish to have your labs drawn at another location, please call the office 24 hours in advance to send orders.  If you have any questions regarding directions or hours of operation,  please call [phone].   Just as a reminder please drink plenty of water prior to coming for your lab work. Thanks!   

## 2019-11-24 ENCOUNTER — Encounter: Payer: PPO | Admitting: *Deleted

## 2019-11-24 DIAGNOSIS — J849 Interstitial pulmonary disease, unspecified: Secondary | ICD-10-CM

## 2019-11-24 NOTE — Progress Notes (Signed)
Cardiac Individual Treatment Plan  Patient Details  Name: Lawrence Wells MRN: [id number] Date of Birth: 1955/12/23 Referring Provider:     Pulmonary Rehab from 10/28/2019 in [facility] [place] Cardiac and Pulmonary Rehab  Referring Provider  Brand Males MD      Initial Encounter Date:    Pulmonary Rehab from 10/28/2019 in [facility] Cardiac and Pulmonary Rehab  Date  10/28/19      Visit Diagnosis: ILD (interstitial lung disease) ([place])  Patient's Home Medications on Admission:  Current Outpatient Medications:  .  azaTHIOprine (IMURAN) 50 MG tablet, Take 1 tablet by mouth daily for 2 weeks, if labs are stable, increase to 2 tablets by mouth daily., Disp: 42 tablet, Rfl: 0 .  diclofenac Sodium (VOLTAREN) 1 % GEL, Apply 2 g topically 3 (three) times daily as needed., Disp: 100 g, Rfl: 2 .  ezetimibe (ZETIA) 10 MG tablet, Take 1 tablet (10 mg total) by mouth daily., Disp: 90 tablet, Rfl: 3 .  gabapentin (NEURONTIN) 100 MG capsule, Start 1 capsule daily, increase by 1 cap every 2-3 days as tolerated up to 3 times a day, or may take 3 at once in evening., Disp: 90 capsule, Rfl: 3 .  Multiple Vitamin (MULTIVITAMIN) tablet, Take 1 tablet by mouth daily., Disp: , Rfl:  .  naproxen (NAPROSYN) 500 MG tablet, Take 1 tablet (500 mg total) by mouth 2 (two) times daily with a meal. For 2-4 weeks then as needed, Disp: 60 tablet, Rfl: 2 .  Nintedanib (OFEV) 150 MG CAPS, Take 1 capsule (150 mg total) by mouth 2 (two) times daily., Disp: 60 capsule, Rfl: 2 .  OXYGEN, Inhale into the lungs as needed., Disp: , Rfl:  .  predniSONE (DELTASONE) 5 MG tablet, Take 2 tablets (10 mg total) by mouth daily with breakfast for 14 days, THEN 1.5 tablets (7.5 mg total) daily with breakfast for 14 days, THEN 1 tablet (5 mg total) daily with breakfast for 14 days, THEN 0.5 tablets (2.5 mg total) daily with breakfast for 14 days., Disp: 70 tablet, Rfl: 0 .  Tiotropium Bromide Monohydrate (SPIRIVA RESPIMAT) 2.5 MCG/ACT AERS, Inhale 2 puffs  into the lungs daily., Disp: 1 g, Rfl: 5  Past Medical History: Past Medical History:  Diagnosis Date  . Arthritis   . COPD (chronic obstructive pulmonary disease) ([place])   . Coronary artery disease   . Dyspnea   . Emphysema lung ([place])   . Hyperlipidemia   . Pulmonary filariasis     Tobacco Use: Social History   Tobacco Use  Smoking Status Former Smoker  . Packs/day: 1.50  . Years: 30.00  . Pack years: 45.00  . Types: Cigarettes  . Quit date: 2007  . Years since quitting: 14.2  Smokeless Tobacco Former Systems developer  . Types: Chew  . Quit date: 2007  Tobacco Comment   Dip smokeless tobacco >20-30 years    Labs: Recent Review Flowsheet Data    Labs for ITP Cardiac and Pulmonary Rehab Latest Ref Rng & Units 05/07/2017 09/02/2019   Cholestrol <200 mg/dL 259(H) 210(H)   LDLCALC mg/dL (calc) 156(H) 134(H)   HDL > OR = 40 mg/dL 76 51   Trlycerides <150 mg/dL 138 139   Hemoglobin A1c <5.7 % of total Hgb 5.5 5.7(H)       Exercise Target Goals: Exercise Program Goal: Individual exercise prescription set using results from initial 6 min walk test and THRR while considering  patient's activity barriers and safety.   Exercise Prescription Goal: Initial exercise prescription  builds to 30-45 minutes a day of aerobic activity, 2-3 days per week.  Home exercise guidelines will be given to patient during program as part of exercise prescription that the participant will acknowledge.   Education: Aerobic Exercise & Resistance Training: - Gives group verbal and written instruction on the various components of exercise. Focuses on aerobic and resistive training programs and the benefits of this training and how to safely progress through these programs..   Education: Exercise & Equipment Safety: - Individual verbal instruction and demonstration of equipment use and safety with use of the equipment.   Pulmonary Rehab from 10/28/2019 in [facility] Cardiac and Pulmonary Rehab  Date  10/28/19    Educator  [facility]  Instruction Review Code  1- Verbalizes Understanding      Education: Exercise Physiology & General Exercise Guidelines: - Group verbal and written instruction with models to review the exercise physiology of the cardiovascular system and associated critical values. Provides general exercise guidelines with specific guidelines to those with heart or lung disease.    Education: Flexibility, Balance, Mind/Body Relaxation: Provides group verbal/written instruction on the benefits of flexibility and balance training, including mind/body exercise modes such as yoga, pilates and tai chi.  Demonstration and skill practice provided.   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 HR  95 bpm     Max Ex. BP  146/72     2 Minute Post BP  134/70       Interval HR   1 Minute HR  82     2 Minute HR  89     3 Minute HR  89     4 Minute HR  95     5 Minute HR  92     6 Minute HR  93     2 Minute Post HR  67     Interval Heart Rate?  Yes       Interval Oxygen   Interval Oxygen?  Yes     Baseline Oxygen Saturation %  95 %     1 Minute Oxygen Saturation %  89 %     1 Minute Liters of Oxygen  0 L Room Air     2 Minute  Oxygen Saturation %  86 %     2 Minute Liters of Oxygen  0 L  3 Minute Oxygen Saturation %  82 %     3 Minute Liters of Oxygen  0 L     4 Minute Oxygen Saturation %  81 %     4 Minute Liters of Oxygen  0 L     5 Minute Oxygen Saturation %  80 %     5 Minute Liters of Oxygen  0 L     6 Minute Oxygen Saturation %  79 %     6 Minute Liters of Oxygen  0 L     2 Minute Post Oxygen Saturation %  91 %     2 Minute Post Liters of Oxygen  0 L        Oxygen Initial Assessment: Oxygen Initial Assessment - 11/03/19 0835      Home Oxygen   Home Oxygen Device  Home Concentrator;E-Tanks    Sleep Oxygen Prescription  Continuous    Liters per minute  2    Home Exercise Oxygen Prescription  Continuous    Liters per minute  2    Home at Rest Exercise Oxygen Prescription  None    Liters per minute  0    Compliance with Home Oxygen Use  Yes       Oxygen Re-Evaluation: Oxygen Re-Evaluation    Row Name 11/03/19 0843             Program Oxygen Prescription   Program Oxygen Prescription  E-Tanks;Continuous       Liters per minute  2       Comments  Required oxygen 2 L  while exercising         Home Oxygen   Home Oxygen Device  Home Concentrator;E-Tanks       Sleep Oxygen Prescription  Continuous       Liters per minute  2       Home Exercise Oxygen Prescription  Continuous       Liters per minute  2       Home at Rest Exercise Oxygen Prescription  None       Liters per minute  0       Compliance with Home Oxygen Use  Yes         Goals/Expected Outcomes   Short Term Goals  To learn and exhibit compliance with exercise, home and travel O2 prescription;To learn and understand importance of monitoring SPO2 with pulse oximeter and demonstrate accurate use of the pulse oximeter.;To learn and understand importance of maintaining oxygen saturations>88%;To learn and demonstrate proper pursed lip breathing techniques or other breathing techniques.       Long  Term Goals  Exhibits compliance  with exercise, home and travel O2 prescription;Verbalizes importance of monitoring SPO2 with pulse oximeter and return demonstration;Maintenance of O2 saturations>88%;Exhibits proper breathing techniques, such as pursed lip breathing or other method taught during program session       Comments  Reviewed PLB technique with pt.  Talked about how it works and it's importance in maintaining their exercise saturations.       Goals/Expected Outcomes  Short: Become more profiecient at using PLB.   Long: Become independent at using PLB.          Oxygen Discharge (Final Oxygen Re-Evaluation): Oxygen Re-Evaluation - 11/03/19 0843      Program Oxygen Prescription   Program Oxygen Prescription  E-Tanks;Continuous    Liters per minute  2    Comments  Required oxygen 2 L  while exercising  Home Oxygen   Home Oxygen Device  Home Concentrator;E-Tanks    Sleep Oxygen Prescription  Continuous    Liters per minute  2    Home Exercise Oxygen Prescription  Continuous    Liters per minute  2    Home at Rest Exercise Oxygen Prescription  None    Liters per minute  0    Compliance with Home Oxygen Use  Yes      Goals/Expected Outcomes   Short Term Goals  To learn and exhibit compliance with exercise, home and travel O2 prescription;To learn and understand importance of monitoring SPO2 with pulse oximeter and demonstrate accurate use of the pulse oximeter.;To learn and understand importance of maintaining oxygen saturations>88%;To learn and demonstrate proper pursed lip breathing techniques or other breathing techniques.    Long  Term Goals  Exhibits compliance with exercise, home and travel O2 prescription;Verbalizes importance of monitoring SPO2 with pulse oximeter and return demonstration;Maintenance of O2 saturations>88%;Exhibits proper breathing techniques, such as pursed lip breathing or other method taught during program session    Comments  Reviewed PLB technique with pt.  Talked about how it works  and it's importance in maintaining their exercise saturations.    Goals/Expected Outcomes  Short: Become more profiecient at using PLB.   Long: Become independent at using PLB.       Initial Exercise Prescription: Initial Exercise Prescription - 10/28/19 1100      Date of Initial Exercise RX and Referring Provider   Date  10/28/19    Referring Provider  Brand Males MD      Oxygen   Oxygen  Continuous    Liters  2      Treadmill   MPH  2.4    Grade  0.5    Minutes  15    METs  3      NuStep   Level  3    SPM  80    Minutes  15    METs  3      Elliptical   Level  1    Speed  3    Minutes  15    METs  3      Biostep-RELP   Level  3    SPM  50    Minutes  15    METs  3      Prescription Details   Frequency (times per week)  2    Duration  Progress to 30 minutes of continuous aerobic without signs/symptoms of physical distress      Intensity   THRR 40-80% of Max Heartrate  98-138    Ratings of Perceived Exertion  11-13    Perceived Dyspnea  0-4      Progression   Progression  Continue to progress workloads to maintain intensity without signs/symptoms of physical distress.      Resistance Training   Training Prescription  Yes    Weight  5 lb    Reps  10-15       Perform Capillary Blood Glucose checks as needed.  Exercise Prescription Changes: Exercise Prescription Changes    Row Name 10/28/19 1100 11/10/19 0900 11/23/19 1500         Response to Exercise   Blood Pressure (Admit)  130/74  110/68  104/60     Blood Pressure (Exercise)  146/72  124/78  134/74     Blood Pressure (Exit)  132/70  104/60  106/60     Heart Rate (Admit)  60  bpm  69 bpm  70 bpm     Heart Rate (Exercise)  95 bpm  90 bpm  97 bpm     Heart Rate (Exit)  62 bpm  74 bpm  74 bpm     Oxygen Saturation (Admit)  95 %  91 %  98 %     Oxygen Saturation (Exercise)  79 %  92 %  82 %     Oxygen Saturation (Exit)  95 %  94 %  98 %     Rating of Perceived Exertion (Exercise)  '7  11  13      ' Perceived Dyspnea (Exercise)  '1  3  3     ' Symptoms  none  SOB  SOB     Comments  walk test results  --  --     Duration  --  Continue with 30 min of aerobic exercise without signs/symptoms of physical distress.  Continue with 30 min of aerobic exercise without signs/symptoms of physical distress.     Intensity  --  THRR unchanged  THRR unchanged       Progression   Progression  --  Continue to progress workloads to maintain intensity without signs/symptoms of physical distress.  Continue to progress workloads to maintain intensity without signs/symptoms of physical distress.     Average METs  --  3  4 4L on TM        Resistance Training   Training Prescription  --  Yes  Yes     Weight  --  5 lb  5 lb     Reps  --  10-15  10-15       Interval Training   Interval Training  --  No  No       Oxygen   Oxygen  --  Continuous  Continuous     Liters  --  2-3  3-4       Treadmill   MPH  --  2.4  2.4     Grade  --  0.5  1     Minutes  --  15  15     METs  --  3  3.17       REL-XR   Level  --  3  4     Minutes  --  15  15        Exercise Comments: Exercise Comments    Row Name 11/03/19 0829 11/03/19 0830         Exercise Comments  First full day of exercise!  Patient was oriented to gym and equipment including functions, settings, policies, and procedures.  Patient's individual exercise prescription and treatment plan were reviewed.  All starting workloads were established based on the results of the 6 minute walk test done at initial orientation visit.  The plan for exercise progression was also introduced and progression will be customized based on patient's performance and goals.  Dropped to 80% on TM  Placed  2l per nasal canuula oxygen on pJohn   Sats back up above 90%         Exercise Goals and Review: Exercise Goals    Row Name 10/28/19 1144             Exercise Goals   Increase Physical Activity  Yes       Intervention  Provide advice, education, support and  counseling about physical activity/exercise needs.;Develop an individualized exercise prescription for aerobic and resistive training based on initial  evaluation findings, risk stratification, comorbidities and participant's personal goals.       Expected Outcomes  Short Term: Attend rehab on a regular basis to increase amount of physical activity.;Long Term: Add in home exercise to make exercise part of routine and to increase amount of physical activity.;Long Term: Exercising regularly at least 3-5 days a week.       Increase Strength and Stamina  Yes       Intervention  Provide advice, education, support and counseling about physical activity/exercise needs.;Develop an individualized exercise prescription for aerobic and resistive training based on initial evaluation findings, risk stratification, comorbidities and participant's personal goals.       Expected Outcomes  Short Term: Increase workloads from initial exercise prescription for resistance, speed, and METs.;Short Term: Perform resistance training exercises routinely during rehab and add in resistance training at home;Long Term: Improve cardiorespiratory fitness, muscular endurance and strength as measured by increased METs and functional capacity (6MWT)       Able to understand and use rate of perceived exertion (RPE) scale  Yes       Intervention  Provide education and explanation on how to use RPE scale       Expected Outcomes  Short Term: Able to use RPE daily in rehab to express subjective intensity level;Long Term:  Able to use RPE to guide intensity level when exercising independently       Able to understand and use Dyspnea scale  Yes       Intervention  Provide education and explanation on how to use Dyspnea scale       Expected Outcomes  Short Term: Able to use Dyspnea scale daily in rehab to express subjective sense of shortness of breath during exertion;Long Term: Able to use Dyspnea scale to guide intensity level when exercising  independently       Knowledge and understanding of Target Heart Rate Range (THRR)  Yes       Intervention  Provide education and explanation of THRR including how the numbers were predicted and where they are located for reference       Expected Outcomes  Short Term: Able to state/look up THRR;Short Term: Able to use daily as guideline for intensity in rehab;Long Term: Able to use THRR to govern intensity when exercising independently       Able to check pulse independently  Yes       Intervention  Provide education and demonstration on how to check pulse in carotid and radial arteries.;Review the importance of being able to check your own pulse for safety during independent exercise       Expected Outcomes  Short Term: Able to explain why pulse checking is important during independent exercise;Long Term: Able to check pulse independently and accurately       Understanding of Exercise Prescription  Yes       Intervention  Provide education, explanation, and written materials on patient's individual exercise prescription       Expected Outcomes  Long Term: Able to explain home exercise prescription to exercise independently;Short Term: Able to explain program exercise prescription          Exercise Goals Re-Evaluation : Exercise Goals Re-Evaluation    Row Name 11/03/19 0834 11/10/19 0909 11/23/19 1535         Exercise Goal Re-Evaluation   Exercise Goals Review  Knowledge and understanding of Target Heart Rate Range (THRR);Able to understand and use Dyspnea scale;Able to understand and use rate of perceived exertion (  RPE) scale;Understanding of Exercise Prescription  Increase Physical Activity;Increase Strength and Stamina;Understanding of Exercise Prescription  Increase Physical Activity;Increase Strength and Stamina;Able to understand and use rate of perceived exertion (RPE) scale;Able to understand and use Dyspnea scale;Knowledge and understanding of Target Heart Rate Range (THRR);Able to check  pulse independently;Understanding of Exercise Prescription     Comments  Reviewed RPE scale, THR and program prescription with pt today.  Pt voiced understanding and was given a copy of goals to take home.  Lawrence Wells is off to a good start in rehab.  He is exercising on 2-3L to maintain his oxygen saturations. He has completed his first three full days of exercise.  We will continue to monitor his progress.  Lawrence Wells had to use 4L on TM due to low O2.  He did increase incline to 1%.  he has increased to level 4 on XR     Expected Outcomes  Short: Use RPE daily to regulate intensity. Long: Follow program prescription in THR.  Short: Continue to attend rehab regularly  Long: Continue to improve stamina and maintain saturations.  Short:  exercise consistently and keep O2 above 88%  Long: improve MET level        Discharge Exercise Prescription (Final Exercise Prescription Changes): Exercise Prescription Changes - 11/23/19 1500      Response to Exercise   Blood Pressure (Admit)  104/60    Blood Pressure (Exercise)  134/74    Blood Pressure (Exit)  106/60    Heart Rate (Admit)  70 bpm    Heart Rate (Exercise)  97 bpm    Heart Rate (Exit)  74 bpm    Oxygen Saturation (Admit)  98 %    Oxygen Saturation (Exercise)  82 %    Oxygen Saturation (Exit)  98 %    Rating of Perceived Exertion (Exercise)  13    Perceived Dyspnea (Exercise)  3    Symptoms  SOB    Duration  Continue with 30 min of aerobic exercise without signs/symptoms of physical distress.    Intensity  THRR unchanged      Progression   Progression  Continue to progress workloads to maintain intensity without signs/symptoms of physical distress.    Average METs  4   4L on TM      Resistance Training   Training Prescription  Yes    Weight  5 lb    Reps  10-15      Interval Training   Interval Training  No      Oxygen   Oxygen  Continuous    Liters  3-4      Treadmill   MPH  2.4    Grade  1    Minutes  15    METs  3.17       REL-XR   Level  4    Minutes  15       Nutrition:  Target Goals: Understanding of nutrition guidelines, daily intake of sodium <1553m, cholesterol <2086m calories 30% from fat and 7% or less from saturated fats, daily to have 5 or more servings of fruits and vegetables.  Education: Controlling Sodium/Reading Food Labels -Group verbal and written material supporting the discussion of sodium use in heart healthy nutrition. Review and explanation with models, verbal and written materials for utilization of the food label.   Education: General Nutrition Guidelines/Fats and Fiber: -Group instruction provided by verbal, written material, models and posters to present the general guidelines for heart healthy nutrition. Gives an  explanation and review of dietary fats and fiber.   Biometrics: Pre Biometrics - 10/28/19 1145      Pre Biometrics   Height  5' 10.75" (1.797 m)    Weight  168 lb 14.4 oz (76.6 kg)    BMI (Calculated)  23.73    Single Leg Stand  30 seconds        Nutrition Therapy Plan and Nutrition Goals:   Nutrition Assessments: Nutrition Assessments - 10/27/19 1118      MEDFICTS Scores   Pre Score  23       MEDIFICTS Score Key:          ?70 Need to make dietary changes          40-70 Heart Healthy Diet         ? 40 Therapeutic Level Cholesterol Diet  Nutrition Goals Re-Evaluation:   Nutrition Goals Discharge (Final Nutrition Goals Re-Evaluation):   Psychosocial: Target Goals: Acknowledge presence or absence of significant depression and/or stress, maximize coping skills, provide positive support system. Participant is able to verbalize types and ability to use techniques and skills needed for reducing stress and depression.   Education: Depression - Provides group verbal and written instruction on the correlation between heart/lung disease and depressed mood, treatment options, and the stigmas associated with seeking treatment.   Education: Sleep  Hygiene -Provides group verbal and written instruction about how sleep can affect your health.  Define sleep hygiene, discuss sleep cycles and impact of sleep habits. Review good sleep hygiene tips.     Education: Stress and Anxiety: - Provides group verbal and written instruction about the health risks of elevated stress and causes of high stress.  Discuss the correlation between heart/lung disease and anxiety and treatment options. Review healthy ways to manage with stress and anxiety.    Initial Review & Psychosocial Screening: Initial Psych Review & Screening - 10/27/19 0846      Initial Review   Current issues with  Current Stress Concerns    Source of Stress Concerns  Chronic Illness;Unable to participate in former interests or hobbies;Unable to perform yard/household activities    Comments  Pulmonary Fibrosis and Arthritis keep him from doing everything that he wants      [place]?  Yes   both have several children, grandkids, neighbors     Barriers   Psychosocial barriers to participate in program  The patient should benefit from training in stress management and relaxation.;Psychosocial barriers identified (see note)      Screening Interventions   Interventions  Encouraged to exercise;To provide support and resources with identified psychosocial needs;Provide feedback about the scores to participant    Expected Outcomes  Short Term goal: Utilizing psychosocial counselor, staff and physician to assist with identification of specific Stressors or current issues interfering with healing process. Setting desired goal for each stressor or current issue identified.;Long Term Goal: Stressors or current issues are controlled or eliminated.;Short Term goal: Identification and review with participant of any Quality of Life or Depression concerns found by scoring the questionnaire.;Long Term goal: The participant improves quality of Life and PHQ9 Scores as seen  by post scores and/or verbalization of changes       Quality of Life Scores:   Scores of 19 and below usually indicate a poorer quality of life in these areas.  A difference of  2-3 points is a clinically meaningful difference.  A difference of 2-3 points in the total score of the  Quality of Life Index has been associated with significant improvement in overall quality of life, self-image, physical symptoms, and general health in studies assessing change in quality of life.  PHQ-9: Recent Review Flowsheet Data    Depression screen [facility] 2/9 10/28/2019 09/08/2019 07/19/2019 05/07/2017   Decreased Interest 1 0 0 0   Down, Depressed, Hopeless 0 0 0 0   PHQ - 2 Score 1 0 0 0   Altered sleeping 2 - - -   Tired, decreased energy 1 - - -   Change in appetite 0 - - -   Feeling bad or failure about yourself  1 - - -   Trouble concentrating 0 - - -   Moving slowly or fidgety/restless 1 - - -   Suicidal thoughts 2  - - -   PHQ-9 Score 8 - - -   Difficult doing work/chores Not difficult at all - - -     Interpretation of Total Score  Total Score Depression Severity:  1-4 = Minimal depression, 5-9 = Mild depression, 10-14 = Moderate depression, 15-19 = Moderately severe depression, 20-27 = Severe depression   Psychosocial Evaluation and Intervention: Psychosocial Evaluation - 10/27/19 0855      Psychosocial Evaluation & Interventions   Interventions  Stress management education;Encouraged to exercise with the program and follow exercise prescription    Comments  Lawrence Wells 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  He has always been an avid exerciser at [employer] prior to the pandemic last year.  He has rhuematoid arthritis all over which also limits his ability to do things and keeps him in chronic pain.  He has a strong support system in his wife.  He is coming to the program after his doctor recommended that he should be supervised as he gets back into his exercise routine.   He wants to get back to exercise and feel better again.  He wants to be able to breathe and able to do more.    Expected Outcomes  Short: Attend rehab to get back into exercise routine.  Long: Continue to improve breathing.    Continue Psychosocial Services   Follow up required by staff       Psychosocial Re-Evaluation:   Psychosocial Discharge (Final Psychosocial Re-Evaluation):   Vocational Rehabilitation: Provide vocational rehab assistance to qualifying candidates.   Vocational Rehab Evaluation & Intervention:   Education: Education Goals: Education classes will be provided on a variety of topics geared toward better understanding of heart health and risk factor modification. Participant will state understanding/return demonstration of topics presented as noted by education test scores.  Learning Barriers/Preferences: Learning Barriers/Preferences - 10/27/19 7425      Learning Barriers/Preferences   Learning Barriers  None    Learning Preferences  None       General Cardiac Education Topics:  AED/CPR: - Group verbal and written instruction with the use of models to demonstrate the basic use of the AED with the basic ABC's of resuscitation.   Anatomy & Physiology of the Heart: - Group verbal and written instruction and models provide basic cardiac anatomy and physiology, with the coronary electrical and arterial systems. Review of Valvular disease and Heart Failure   Cardiac Procedures: - Group verbal and written instruction to review commonly prescribed medications for heart disease. Reviews the medication, class of the drug, and side effects. Includes the steps to properly store meds and maintain the prescription regimen. (beta blockers and nitrates)   Cardiac Medications  I: - Group verbal and written instruction to review commonly prescribed medications for heart disease. Reviews the medication, class of the drug, and side effects. Includes the steps to properly  store meds and maintain the prescription regimen.   Cardiac Medications II: -Group verbal and written instruction to review commonly prescribed medications for heart disease. Reviews the medication, class of the drug, and side effects. (all other drug classes)    Go Sex-Intimacy & Heart Disease, Get SMART - Goal Setting: - Group verbal and written instruction through game format to discuss heart disease and the return to sexual intimacy. Provides group verbal and written material to discuss and apply goal setting through the application of the S.M.A.R.T. Method.   Other Matters of the Heart: - Provides group verbal, written materials and models to describe Stable Angina and Peripheral Artery. Includes description of the disease process and treatment options available to the cardiac patient.   Infection Prevention: - Provides verbal and written material to individual with discussion of infection control including proper hand washing and proper equipment cleaning during exercise session.   Pulmonary Rehab from 10/28/2019 in [facility] Cardiac and Pulmonary Rehab  Date  10/28/19  Educator  [facility]  Instruction Review Code  1- Verbalizes Understanding      Falls Prevention: - Provides verbal and written material to individual with discussion of falls prevention and safety.   Pulmonary Rehab from 10/28/2019 in [facility] Cardiac and Pulmonary Rehab  Date  10/28/19  Educator  [facility]  Instruction Review Code  1- Verbalizes Understanding      Other: -Provides group and verbal instruction on various topics (see comments)   Knowledge Questionnaire Score: Knowledge Questionnaire Score - 10/27/19 1118      Knowledge Questionnaire Score   Pre Score  15/18 Education Focus: O2 Safety and Exercise       Core Components/Risk Factors/Patient Goals at Admission: Personal Goals and Risk Factors at Admission - 10/28/19 1145      Core Components/Risk Factors/Patient Goals on Admission    Weight Management   Yes;Weight Maintenance    Intervention  Weight Management: Develop a combined nutrition and exercise program designed to reach desired caloric intake, while maintaining appropriate intake of nutrient and fiber, sodium and fats, and appropriate energy expenditure required for the weight goal.;Weight Management: Provide education and appropriate resources to help participant work on and attain dietary goals.    Admit Weight  168 lb 14.4 oz (76.6 kg)    Goal Weight: Short Term  165 lb (74.8 kg)    Goal Weight: Long Term  165 lb (74.8 kg)    Expected Outcomes  Short Term: Continue to assess and modify interventions until short term weight is achieved;Long Term: Adherence to nutrition and physical activity/exercise program aimed toward attainment of established weight goal;Weight Maintenance: Understanding of the daily nutrition guidelines, which includes 25-35% calories from fat, 7% or less cal from saturated fats, less than 243m cholesterol, less than 1.5gm of sodium, & 5 or more servings of fruits and vegetables daily    Improve shortness of breath with ADL's  Yes    Intervention  Provide education, individualized exercise plan and daily activity instruction to help decrease symptoms of SOB with activities of daily living.    Expected Outcomes  Short Term: Improve cardiorespiratory fitness to achieve a reduction of symptoms when performing ADLs;Long Term: Be able to perform more ADLs without symptoms or delay the onset of symptoms    Lipids  Yes    Intervention  Provide  education and support for participant on nutrition & aerobic/resistive exercise along with prescribed medications to achieve LDL <6m, HDL >480m    Expected Outcomes  Short Term: Participant states understanding of desired cholesterol values and is compliant with medications prescribed. Participant is following exercise prescription and nutrition guidelines.;Long Term: Cholesterol controlled with medications as prescribed, with  individualized exercise RX and with personalized nutrition plan. Value goals: LDL < 7077mHDL > 40 mg.       Education:Diabetes - Individual verbal and written instruction to review signs/symptoms of diabetes, desired ranges of glucose level fasting, after meals and with exercise. Acknowledge that pre and post exercise glucose checks will be done for 3 sessions at entry of program.   Education: Know Your Numbers and Risk Factors: -Group verbal and written instruction about important numbers in your health.  Discussion of what are risk factors and how they play a role in the disease process.  Review of Cholesterol, Blood Pressure, Diabetes, and BMI and the role they play in your overall health.   Core Components/Risk Factors/Patient Goals Review:    Core Components/Risk Factors/Patient Goals at Discharge (Final Review):    ITP Comments: ITP Comments    Row Name 10/27/19 0912 10/28/19 1107 11/03/19 0828 11/10/19 0552     ITP Comments  Completed virtual orientation today.  EP evaluation is scheduled for Thursday 3/11 at 930am.  [facility]  Completed 6MWT and gym orientation.  Initial ITP created and sent for review to Dr. MarEmily Filbertedical Director.  First full day of exercise!  Patient was oriented to gym and equipment including functions, settings, policies, and procedures.  Patient's individual exercise prescription and treatment plan were reviewed.  All starting workloads were established based on the results of the 6 minute walk test done at initial orientation visit.  The plan for exercise progression was also introduced and progression will be customized based on patient's performance and goals.  30 day chart review completed. ITP sent to Dr M MZachery Dakinsdical Director, for review,changes as needed and signature. Continue with ITP if no changes requested       Comments:

## 2019-11-24 NOTE — Progress Notes (Signed)
Daily Session Note  Patient Details  Name: Lawrence Wells MRN: [id number] Date of Birth: 22-Nov-1955 Referring Provider:     Pulmonary Rehab from 10/28/2019 in [facility] Cardiac and Pulmonary Rehab  Referring Provider  Brand Males MD      Encounter Date: 11/24/2019  Check In: Session Check In - 11/24/19 0824      Check-In   Supervising physician immediately available to respond to emergencies  See telemetry face sheet for immediately available ER MD    Location  [facility]-Cardiac & Pulmonary Rehab    Staff Present  Nyoka Cowden, RN, BSN, MA;Susanne Bice, RN, BSN, CCRP;Joseph Hood RCP,RRT,BSRT    Virtual Visit  No    Medication changes reported      No    Fall or balance concerns reported     No    Warm-up and Cool-down  Performed on first and last piece of equipment    VAD Patient?  No          Social History   Tobacco Use  Smoking Status Former Smoker  . Packs/day: 1.50  . Years: 30.00  . Pack years: 45.00  . Types: Cigarettes  . Quit date: 2007  . Years since quitting: 14.2  Smokeless Tobacco Former Systems developer  . Types: Chew  . Quit date: 2007  Tobacco Comment   Dip smokeless tobacco >20-30 years    Goals Met:  Independence with exercise equipment Exercise tolerated well No report of cardiac concerns or symptoms  Goals Unmet:  Not Applicable  Comments: Pt able to follow exercise prescription today without complaint.  Will continue to monitor for progression.   Dr. Emily Filbert is Medical Director for [place] and [facility].

## 2019-11-25 ENCOUNTER — Other Ambulatory Visit: Payer: Self-pay

## 2019-11-25 DIAGNOSIS — J849 Interstitial pulmonary disease, unspecified: Secondary | ICD-10-CM

## 2019-11-25 NOTE — Progress Notes (Signed)
Daily Session Note  Patient Details  Name: Lawrence Wells MRN: [id number] Date of Birth: [birth year] Referring Provider:     Pulmonary Rehab from 10/28/2019 in [facility] Cardiac and Pulmonary Rehab  Referring Provider  Brand Males MD      Encounter Date: 11/25/2019  Check In: Session Check In - 11/25/19 0746      Check-In   Supervising physician immediately available to respond to emergencies  See telemetry face sheet for immediately available ER MD    Location  [facility]-Cardiac & Pulmonary Rehab    Staff Present  Vida Rigger RN, Vickki Hearing, BA, ACSM CEP, Exercise Physiologist;Melissa Caiola RDN, LDN;Susanne Bice, RN, BSN, CCRP    Virtual Visit  No    Medication changes reported      No    Fall or balance concerns reported     No    Warm-up and Cool-down  Performed on first and last piece of equipment    Resistance Training Performed  Yes    VAD Patient?  No    PAD/SET Patient?  No      Pain Assessment   Currently in Pain?  No/denies          Social History   Tobacco Use  Smoking Status Former Smoker  . Packs/day: 1.50  . Years: 30.00  . Pack years: 45.00  . Types: Cigarettes  . Quit date: 2007  . Years since quitting: 14.2  Smokeless Tobacco Former Systems developer  . Types: Chew  . Quit date: 2007  Tobacco Comment   Dip smokeless tobacco >20-30 years    Goals Met:  Proper associated with RPD/PD & O2 Sat Independence with exercise equipment Exercise tolerated well No report of cardiac concerns or symptoms Strength training completed today  Goals Unmet:  Not Applicable  Comments: Pt able to follow exercise prescription today without complaint.  Will continue to monitor for progression.   Dr. Emily Filbert is Medical Director for [place] and [facility].

## 2019-11-29 ENCOUNTER — Other Ambulatory Visit: Payer: Self-pay | Admitting: Internal Medicine

## 2019-11-29 DIAGNOSIS — J8410 Pulmonary fibrosis, unspecified: Secondary | ICD-10-CM

## 2019-12-01 ENCOUNTER — Other Ambulatory Visit: Payer: Self-pay

## 2019-12-01 ENCOUNTER — Encounter: Payer: PPO | Admitting: *Deleted

## 2019-12-01 DIAGNOSIS — J849 Interstitial pulmonary disease, unspecified: Secondary | ICD-10-CM

## 2019-12-01 NOTE — Progress Notes (Signed)
Daily Session Note  Patient Details  Name: MALIKHI OGAN MRN: [id number] Date of Birth: 06/01/1956 Referring Provider:     Pulmonary Rehab from 10/28/2019 in [facility] Cardiac and Pulmonary Rehab  Referring Provider  Brand Males MD      Encounter Date: 12/01/2019  Check In: Session Check In - 12/01/19 0749      Check-In   Supervising physician immediately available to respond to emergencies  See telemetry face sheet for immediately available ER MD    Location  [facility]-Cardiac & Pulmonary Rehab    Staff Present  Heath Lark, RN, BSN, Lance Sell, BA, ACSM CEP, Exercise Physiologist;Joseph Hood RCP,RRT,BSRT    Virtual Visit  No    Medication changes reported      No    Fall or balance concerns reported     No    Warm-up and Cool-down  Performed on first and last piece of equipment    Resistance Training Performed  Yes    VAD Patient?  No    PAD/SET Patient?  No      Pain Assessment   Currently in Pain?  No/denies          Social History   Tobacco Use  Smoking Status Former Smoker  . Packs/day: 1.50  . Years: 30.00  . Pack years: 45.00  . Types: Cigarettes  . Quit date: 2007  . Years since quitting: 14.2  Smokeless Tobacco Former Systems developer  . Types: Chew  . Quit date: 2007  Tobacco Comment   Dip smokeless tobacco >20-30 years    Goals Met:  Proper associated with RPD/PD & O2 Sat Independence with exercise equipment Exercise tolerated well No report of cardiac concerns or symptoms  Goals Unmet:  O2 Sat  Today while on the TM, sats dropped to 85%. Cued PLB and decreased Workload to get sat to 88%  Comments: Pt able to follow exercise prescription today without complaint.  Will continue to monitor for progression.    Dr. Emily Filbert is Medical Director for [place] and [facility].

## 2019-12-02 ENCOUNTER — Encounter: Payer: PPO | Admitting: *Deleted

## 2019-12-02 ENCOUNTER — Other Ambulatory Visit: Payer: Self-pay

## 2019-12-02 ENCOUNTER — Encounter: Payer: Self-pay | Admitting: Internal Medicine

## 2019-12-02 ENCOUNTER — Ambulatory Visit (INDEPENDENT_AMBULATORY_CARE_PROVIDER_SITE_OTHER): Payer: PPO | Admitting: Internal Medicine

## 2019-12-02 VITALS — BP 130/74 | HR 70 | Temp 97.7°F | Ht 71.0 in | Wt 163.8 lb

## 2019-12-02 DIAGNOSIS — J8489 Other specified interstitial pulmonary diseases: Secondary | ICD-10-CM

## 2019-12-02 DIAGNOSIS — Z5181 Encounter for therapeutic drug level monitoring: Secondary | ICD-10-CM

## 2019-12-02 DIAGNOSIS — J849 Interstitial pulmonary disease, unspecified: Secondary | ICD-10-CM

## 2019-12-02 DIAGNOSIS — R768 Other specified abnormal immunological findings in serum: Secondary | ICD-10-CM | POA: Diagnosis not present

## 2019-12-02 DIAGNOSIS — J439 Emphysema, unspecified: Secondary | ICD-10-CM

## 2019-12-02 DIAGNOSIS — M359 Systemic involvement of connective tissue, unspecified: Secondary | ICD-10-CM | POA: Diagnosis not present

## 2019-12-02 LAB — HEPATIC FUNCTION PANEL
ALT: 29 U/L (ref 0–53)
AST: 25 U/L (ref 0–37)
Albumin: 3.9 g/dL (ref 3.5–5.2)
Alkaline Phosphatase: 88 U/L (ref 39–117)
Bilirubin, Direct: 0.1 mg/dL (ref 0.0–0.3)
Total Bilirubin: 0.5 mg/dL (ref 0.2–1.2)
Total Protein: 7.4 g/dL (ref 6.0–8.3)

## 2019-12-02 NOTE — Progress Notes (Signed)
Daily Session Note  Patient Details  Name: Lawrence Wells MRN: [id number] Date of Birth: 13-Apr-1956 Referring Provider:     Pulmonary Rehab from 10/28/2019 in [facility] Cardiac and Pulmonary Rehab  Referring Provider  Brand Males MD      Encounter Date: 12/02/2019  Check In: Session Check In - 12/02/19 0753      Check-In   Supervising physician immediately available to respond to emergencies  See telemetry face sheet for immediately available ER MD    Location  [facility]-Cardiac & Pulmonary Rehab    Staff Present  Heath Lark, RN, BSN, Lance Sell, BA, ACSM CEP, Exercise Physiologist;Laureen Owens Shark, BS, RRT, CPFT    Virtual Visit  No    Medication changes reported      No    Fall or balance concerns reported     No    Warm-up and Cool-down  Performed on first and last piece of equipment    Resistance Training Performed  Yes    VAD Patient?  No    PAD/SET Patient?  No      Pain Assessment   Currently in Pain?  No/denies          Social History   Tobacco Use  Smoking Status Former Smoker  . Packs/day: 1.50  . Years: 30.00  . Pack years: 45.00  . Types: Cigarettes  . Quit date: 2007  . Years since quitting: 14.2  Smokeless Tobacco Former Systems developer  . Types: Chew  . Quit date: 2007  Tobacco Comment   Dip smokeless tobacco >20-30 years    Goals Met:  Proper associated with RPD/PD & O2 Sat Independence with exercise equipment Exercise tolerated well No report of cardiac concerns or symptoms  Goals Unmet:  Not Applicable  Comments: Pt able to follow exercise prescription today without complaint.  Will continue to monitor for progression.    Dr. Emily Filbert is Medical Director for [place] and [facility].

## 2019-12-02 NOTE — Patient Instructions (Addendum)
ICD-10-CM   1. Interstitial lung disease due to connective tissue disease ([place])  J84.89    M35.9   2. Pulmonary emphysema, unspecified emphysema type ([place])  J43.9   3. 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 at ptipff@gmail .com for local support group information  Continie pulmonary rehabilitation at [facility]  - REfer [place] lung Transplant team  -Research as a care option: this is a consideration for the future  - do spiro/dlco in 8-12 weeks  Rheumatoid ARthrisi  Pain from RA still an issue  Plan  - -Continue immuran  - COmplete prednisone taper per rheum - call your rheum and ask if you will benefit from plaquenil from pain  Emphysema  - continue spiriva  Weight loss  - could be due to RA. Could be due to [place]  - monitor ince a week at home - call if any concern  Followup - 4-6 weeks tele visit with Dr Chase Caller  Or an APP  - 8-12 weeks do spiro and dlco at [facility] - 8-12 weeks return to see Dr Chase Caller in [facility]

## 2019-12-02 NOTE — Progress Notes (Signed)
OV 09/10/2019  Subjective:  Patient ID: Lawrence Wells, male , DOB: 29-Dec-1955 , age 64 y.o. , MRN: [id number] , ADDRESS: [place] [id number]   09/10/2019 -   Chief Complaint  Patient presents with  . pulmonary consult    per Dr. Parks Ranger- CXR 09/08/2019. pt reports of sob with exertion, talking and bending, prod cough with yellow mucus, left sided chest discomfort  and wheezing.     HPI Lawrence Wells 64 y.o. -referred for interstitial lung disease after a chest x-ray from September 08, 2019 that I personally visualized and interpreted shows diffuse ILD along with left upper lobe nodule.  Patient has wife tell me that he has had insidious onset of shortness of breath for a year with progression in the last few months.  They also tell me that few years ago he had an chest x-ray done for an incidental unrelated reason that showed chronic changes of scarring but apparently were reassured.  In review of his chart and noticed that in 2005 this is chest x-ray done in the [facility] system with reports of ILD in it.  He is unaware of that.  History for this visit is given by the patient and his wife and review of the chart.  Integrated Comprehensive ILD Questionnaire  Symptoms:   Insidious onset of shortness of breath for the last year getting worse in the last 3 to 4 months.  Definitely no dyspnea a few years ago even though chest x-ray from 2005 was reported as ILD.  Sometimes dyspnea is episodic but mostly with exertion relieved by rest.  Symptom severity is listed below.  There is associated significant cough.  Cough started in October 2020.  It is getting worse cough is rated as moderate to severe.  He coughs at night.  He does bring up some yellow phlegm.  It is worse when he lies down.Marland Kitchen  He does clear his throat and he does feel a tickle in the back of the throat.  There is also associated wheezing.       Past Medical History : He gives a history positive  for arthritis.  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  Denies any asthma or known COPD.  Denies heart failure.  Denies scleroderma any collagen vascular disease.  Denies diagnosis of acid reflux or hiatal hernia.  Denies obstructive sleep apnea.  Denies pulmonary hypertension.  Denies diabetes or thyroid disease or stroke or seizures.  Denies infectious mononucleosis.  Denies hepatitis.  Denies tuberculosis denies kidney disease.  Denies blood clots denies heart disease denies pleurisy.   ROS: Positive for fatigue and arthralgia.  Positive for acid reflux.  He went to check the box for [name].  Denies any GI symptoms or rash or ulcers  FAMILY HISTORY of LUNG DISEASE: Denies including COPD and pulmonary fibrosis   EXPOSURE HISTORY: Smokes cigarettes between 1983 and 2007 1 to 2 packs/day and then quit.  Did not smoke cigars did not smoke pipe.  Did not vape.  Never smoked marijuana or cocaine.  Never used intravenous drugs.   HOME and HOBBY DETAILS : [place].  The age of the home is 20 years.  There is no dampness in the living environment.  No mold or mildew.  Does not use  humidifier.  No CPAP use.  Does not use nebulizer.  No steam iron use.  No Jacuzzi.  No pet birds or parakeets in the house.  No pet gerbils.  No feather pillows.  No mold in the [facility] duct.  Does not play wind instruments.  Does not do any gardening.   OCCUPATIONAL HISTORY (122 questions) : Works in the Transport planner for 30 years.  Worked in [employer] and crawl spaces.  A lot of dust exposure.  During this time he worked in damp air-conditioned spaces.  He thinks he had asbestos exposure.  He also worked in the [employer].  He cleaned AC coils.  Otherwise history is negative   PULMONARY TOXICITY HISTORY (27 items): Entirely  negative.       ROS - per HPI    OV 10/22/2019  Subjective:  Patient ID: Lawrence Wells, male , DOB: Lawrence 11, 1957 , age 35 y.o. , MRN: [id number] , ADDRESS: [place] [id number]   10/22/2019 -   Chief Complaint  Patient presents with  . Follow-up    ILD Followup. SOB on exertion. Non prod cough. Pt denies any wheezing, fever, chills, or sweats.   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  Started on nintedanib 10/12/2019  Associated emphysema present on CT scan January 2021  Normal cardiac stress test 10/14/2019   HPI Lawrence Wells 64 y.o. -presents for follow-up with his wife.  At last saw him end of January 2021 for ILD evaluation.  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  He then saw a rheumatologist Dr. D yesterday 10/21/2019 and given a diagnosis of joint rheumatoid arthritis.  In the interim he also saw pulmonary nurse practitioner for a virtual visit and was started on nintedanib but she started [place] on 10/12/2019.  So far is tolerating it fine.  He is using oxygen only with exertion.  There is no deterioration in symptoms.  Yesterday the rheumatology visit Imuran has been recommended first rheumatoid arthritis.  The TPMT test has been done and the results are pending.  He does not have liver function tests checked after he started his nintedanib.  He and his wife have many questions about the future course of the disease and natural history and other management strategies.  Currently is free was not helping him but he is willing to take it because of the associated emphysema.   I personally visualized and reviewed the  CT and also interpreted the findings myself   OV 12/02/2019  Subjective:  Patient ID: Lawrence Wells, male , DOB: [birth year] , age 19 y.o. , MRN: [id number] , ADDRESS: [place] [address]   12/02/2019 -   Chief Complaint  Patient presents with  . Follow-up    Follow-up rheumatoid arthritis-ILD/UIP pattern (based on high titer rheumatoid factor 09/10/2019, UIP on CT scan 09/14/2019 and rheumatology visit confirming diagnosis of rheumatoid arthritis on 10/21/2019].  Started on nintedanib 10/12/2019  Associated emphysema present on CT scan January 2021  Normal cardiac stress test 10/14/2019  Rheumatoid arthritis on Imuran  HPI Lawrence Wells 64 y.o. -6 returns for follow-up with his wife to  the [place] [facility].  He is a Engineer, manufacturing systems.  He normally seen in the [place] clinic.  At this point in time he tells me that he is tolerating the nintedanib fine.  He needs a liver function test.  However he has lost weight 160 pounds.  He does not know the reason.  He is tolerating nintedanib fine.  He is on Imuran for his rheumatoid arthritis.  In terms of his effort tolerance it is stable.  He is undergoing pulmonary rehabilitation and is using 3-4 L of oxygen with exertion.  He is still on the waiting list for a portable system.  He is willing to be seen by the transplant team for pulmonary fibrosis..    In terms of his rheumatoid arthritis he is on Imuran he is on a prednisone taper but still having pain.  He had her steroid injections.  He is not on Plaquenil.  I have encouraged him to talk to his rheumatologist about it.  Overall stable  Overall symptom score listed below.   SYMPTOM SCALE - ILD 12/02/2019 On ofev, immuran, rehab  wegt 163#  O2 use ra  Shortness of Breath 0 -> 5 scale with 5 being worst (score 6 If unable to do)  At rest 0  Simple tasks - showers, clothes change, eating, shaving 3  Household (dishes, doing bed, laundry) 3  Shopping 4  Walking  level at own pace 3.5  Walking up Stairs 4  Total (30-36) Dyspnea Score 18  How bad is your cough? 3  How bad is your fatigue 3  How bad is nausea 0  How bad is vomiting?  0  How bad is diarrhea? 0  How bad is anxiety? 2  How bad is depression 2        Simple office walk 185 feet x  3 laps goal with forehead probe 09/10/2019  10/22/2019   O2 used ra ra - walk, uses 2L portable at home with exerion  Number laps completed 3 3  Comments about pace mod   Resting Pulse Ox/HR 98% and 74/min 95% and 71  Final Pulse Ox/HR 88% and 96/min 86% and 101  Desaturated </= 88% yes yes  Desaturated <= 3% points Yes, 10 points Yes, 9 pon  Got Tachycardic >/= 90/min yes yes  Symptoms at end of test Yes "pretty heavy" yes  Miscellaneous comments Corrected 2L      ROS - per HPI     has a past medical history of Arthritis, COPD (chronic obstructive pulmonary disease) ([place]), Coronary artery disease, Dyspnea, Emphysema lung ([place]), Hyperlipidemia, and Pulmonary filariasis.   reports that he quit smoking about 14 years ago. His smoking use included cigarettes. He has a 45.00 pack-year smoking history. He quit smokeless tobacco use about 14 years ago.  His smokeless tobacco use included chew.  Past Surgical History:  Procedure Laterality Date  . SINUS SURGERY WITH INSTATRAK      Allergies  Allergen Reactions  . Atorvastatin Other (See Comments)    Myalgias    Immunization History  Administered Date(s) Administered  . PFIZER SARS-COV-2 Vaccination 11/22/2019  . Pneumococcal Conjugate-13 09/08/2019  . Tdap 04/19/2015, 05/07/2017    Family History  Problem Relation Age of Onset  . Heart disease Mother   . Heart attack Mother 60  . Arthritis Mother   . Heart disease Father   . Heart attack Father 67  . Healthy Sister   . Hyperlipidemia Brother   . Throat cancer Brother  17  . Heart disease Brother   . Healthy Son   . Healthy Daughter      Current Outpatient Medications:  .   azaTHIOprine (IMURAN) 50 MG tablet, Take 1 tablet by mouth daily for 2 weeks, if labs are stable, increase to 2 tablets by mouth daily., Disp: 42 tablet, Rfl: 0 .  diclofenac Sodium (VOLTAREN) 1 % GEL, Apply 2 g topically 3 (three) times daily as needed., Disp: 100 g, Rfl: 2 .  ezetimibe (ZETIA) 10 MG tablet, Take 1 tablet (10 mg total) by mouth daily., Disp: 90 tablet, Rfl: 3 .  gabapentin (NEURONTIN) 100 MG capsule, Start 1 capsule daily, increase by 1 cap every 2-3 days as tolerated up to 3 times a day, or may take 3 at once in evening., Disp: 90 capsule, Rfl: 3 .  Multiple Vitamin (MULTIVITAMIN) tablet, Take 1 tablet by mouth daily., Disp: , Rfl:  .  naproxen (NAPROSYN) 500 MG tablet, Take 1 tablet (500 mg total) by mouth 2 (two) times daily with a meal. For 2-4 weeks then as needed, Disp: 60 tablet, Rfl: 2 .  OFEV 150 MG CAPS, TAKE 1 CAPSULE BY MOUTH TWICE DAILY WITH FOOD. CALL [phone] FOR REFILLS, Disp: 60 capsule, Rfl: 5 .  OXYGEN, Inhale into the lungs as needed., Disp: , Rfl:  .  predniSONE (DELTASONE) 5 MG tablet, Take 2 tablets (10 mg total) by mouth daily with breakfast for 14 days, THEN 1.5 tablets (7.5 mg total) daily with breakfast for 14 days, THEN 1 tablet (5 mg total) daily with breakfast for 14 days, THEN 0.5 tablets (2.5 mg total) daily with breakfast for 14 days., Disp: 70 tablet, Rfl: 0 .  Tiotropium Bromide Monohydrate (SPIRIVA RESPIMAT) 2.5 MCG/ACT AERS, Inhale 2 puffs into the lungs daily., Disp: 1 g, Rfl: 5      Objective:   Vitals:   12/02/19 1014  BP: 130/74  Pulse: 70  Temp: 97.7 F (36.5 C)  TempSrc: Temporal  SpO2: 93%  Weight: 163 lb 12.8 oz (74.3 kg)  Height: 5\' 11"  (1.803 m)    Estimated body mass index is 22.85 kg/m as calculated from the following:   Height as of this encounter: 5\' 11"  (1.803 m).   Weight as of this encounter: 163 lb 12.8 oz (74.3 kg).  @WEIGHTCHANGE @  [employer]   12/02/19 1014  Weight: 163 lb 12.8 oz (74.3 kg)      Physical Exam Thin male well-built.  Bilateral bibasal crackles present.  Clubbing present.         Assessment:       ICD-10-CM   1. Interstitial lung disease due to connective tissue disease ([place])  J84.89 Ambulatory referral to Pulmonology   M35.9 Hepatic function panel  2. Pulmonary emphysema, unspecified emphysema type ([place])  J43.9   3. Rheumatoid factor positive  R76.8   4. Encounter for therapeutic drug monitoring  Z51.81 Hepatic function panel       Plan:     Patient Instructions     ICD-10-CM   1. Interstitial lung disease due to connective tissue disease ([place])  J84.89    M35.9   2. Pulmonary emphysema, unspecified emphysema type ([place])  J43.9   3. 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 at ptipff@gmail .com for local support group information  Continie pulmonary rehabilitation at [facility]  - REfer [place] lung Transplant team  -Research as a care option: this is a consideration for the future  - do spiro/dlco in 8-12 weeks  Rheumatoid ARthrisi  Pain from RA still an issue  Plan  - -Continue immuran  - COmplete prednisone taper per rheum - call your rheum and ask if you will benefit from plaquenil from pain  Emphysema  - continue spiriva  Weight loss  - could be due to RA. Could be due to [place]  - monitor ince a week at home - call if any concern  Followup - 4-6 weeks tele visit  with Dr Chase Caller  Or an APP  - 8-12 weeks do spiro and dlco at [facility] - 8-12 weeks return to see Dr Chase Caller in [facility] [facility]       Overall he is high risk medication use and requires intensive monitoring   SIGNATURE    Dr. Brand Males, M.D., F.C.C.P,  Pulmonary and Critical Care Medicine Staff Physician, [place] Director - Interstitial Lung Disease  Program  Pulmonary [place] at [place], [place], [address]  Pager: [phone], If no answer or between  15:00h - 7:00h: call [phone] Telephone: [phone]  10:51 AM 12/02/2019

## 2019-12-02 NOTE — Addendum Note (Signed)
Addended by: Suzzanne Cloud E on: 12/02/2019 10:53 AM   Modules accepted: Orders

## 2019-12-03 NOTE — Progress Notes (Signed)
Lab             12/02/19                      1054         AST          25           ALT          29           ALKPHOS      88           BILITOT      0.5          PROT         7.4          ALBUMIN      3.9            LFT normal

## 2019-12-08 ENCOUNTER — Other Ambulatory Visit: Payer: Self-pay

## 2019-12-08 ENCOUNTER — Encounter: Payer: PPO | Admitting: *Deleted

## 2019-12-08 ENCOUNTER — Encounter: Payer: Self-pay | Admitting: *Deleted

## 2019-12-08 DIAGNOSIS — J849 Interstitial pulmonary disease, unspecified: Secondary | ICD-10-CM | POA: Diagnosis not present

## 2019-12-08 NOTE — Progress Notes (Signed)
Pulmonary Individual Treatment Plan  Patient Details  Name: Lawrence Wells MRN: [id number] Date of Birth: February 11, 1956 Referring Provider:     Pulmonary Rehab from 10/28/2019 in [facility] Cardiac and Pulmonary Rehab  Referring Provider  Brand Males MD      Initial Encounter Date:    Pulmonary Rehab from 10/28/2019 in [facility]iac and Pulmonary Re[facility]  Date  10/28/19      Visit Diagnosis: ILD (interstitial lung disease) ([place])  Patient's Home Medications on Admission:  Current Outpatient Medications:    azaTHIOprine (IMURAN) 50 MG tablet, Take 1 tablet by mouth daily for 2 weeks, if labs are stable, increase to 2 tablets by mouth daily., Disp: 42 tablet, Rfl: 0   diclofenac Sodium (VOLTAREN) 1 % GEL, Apply 2 g topically 3 (three) times daily as needed., Disp: 100 g, Rfl: 2   ezetimibe (ZETIA) 10 MG tablet, Take 1 tablet (10 mg total) by mouth daily., Disp: 90 tablet, Rfl: 3   gabapentin (NEURONTIN) 100 MG capsule, Start 1 capsule daily, increase by 1 cap every 2-3 days as tolerated up to 3 times a day, or may take 3 at once in evening., Disp: 90 capsule, Rfl: 3   Multiple Vitamin (MULTIVITAMIN) tablet, Take 1 tablet by mouth daily., Disp: , Rfl:    naproxen (NAPROSYN) 500 MG tablet, Take 1 tablet (500 mg total) by mouth 2 (two) times daily with a meal. For 2-4 weeks then as needed, Disp: 60 tablet, Rfl: 2   OFEV 150 MG CAPS, TAKE 1 CAPSULE BY MOUTH TWICE DAILY WITH FOOD. CALL [phone] FOR REFILLS, Disp: 60 capsule, Rfl: 5   OXYGEN, Inhale into the lungs as needed., Disp: , Rfl:    predniSONE (DELTASONE) 5 MG tablet, Take 2 tablets (10 mg total) by mouth daily with breakfast for 14 days, THEN 1.5 tablets (7.5 mg total) daily with breakfast for 14 days, THEN 1 tablet (5 mg total) daily with breakfast for 14 days, THEN 0.5 tablets (2.5 mg total) daily with breakfast for 14 days., Disp: 70 tablet, Rfl: 0   Tiotropium Bromide Monohydrate (SPIRIVA RESPIMAT) 2.5 MCG/ACT AERS, Inhale 2  puffs into the lungs daily., Disp: 1 g, Rfl: 5  Past Medical History: Past Medical History:  Diagnosis Date   Arthritis    COPD (chronic obstructive pulmonary disease) (HCC)    Coronary artery disease    Dyspnea    Emphysema lung (HCC)    Hyperlipidemia    Pulmonary filariasis     Tobacco Use: Social History   Tobacco Use  Smoking Status Former Smoker   Packs/day: 1.50   Years: 30.00   Pack years: 45.00   Types: Cigarettes   Quit date: 2007   Years since quitting: 14.3  Smokeless Tobacco Former User   Types: Chew   Quit date: 2007  Tobacco Comment   Dip smokeless tobacco >20-30 years    Labs: Recent [employer] for ITP Cardiac and Pulmonary Rehab Latest Ref Rng & Units 05/07/2017 09/02/2019   Cholestrol <200 mg/dL 259(H) 210(H)   LDLCALC mg/dL (calc) 156(H) 134(H)   HDL > OR = 40 mg/dL 76 51   Trlycerides <150 mg/dL 138 139   Hemoglobin A1c <5.7 % of total Hgb 5.5 5.7(H)       Pulmonary Assessment Scores: Pulmonary Assessment Scores    Row Name 10/27/19 1117         ADL UCSD   ADL Phase  Entry     SOB Score total  41     Rest  0     Walk  3     Stairs  4     Bath  2     Dress  2     Shop  1       CAT Score   CAT Score  15        UCSD: Self-administered rating of dyspnea associated with activities of daily living (ADLs) 6-point scale (0 = "not at all" to 5 = "maximal or unable to do because of breathlessness")  Scoring Scores range from 0 to 120.  Minimally important difference is 5 units  CAT: CAT can identify the health impairment of COPD patients and is better correlated with disease progression.  CAT has a scoring range of zero to 40. The CAT score is classified into four groups of low (less than 10), medium (10 - 20), high (21-30) and very high (31-40) based on the impact level of disease on health status. A CAT score over 10 suggests significant symptoms.  A worsening CAT score could be explained by an  exacerbation, poor medication adherence, poor inhaler technique, or progression of COPD or comorbid conditions.  CAT MCID is 2 points  mMRC: mMRC (Modified Medical Research Council) Dyspnea Scale is used to assess the degree of baseline functional disability in patients of respiratory disease due to dyspnea. No minimal important difference is established. A decrease in score of 1 point or greater is considered a positive change.   Pulmonary Function Assessment: Pulmonary Function Assessment - 10/27/19 1118      Breath   Shortness of Breath  Yes;Limiting activity       Exercise Target Goals: Exercise Program Goal: Individual exercise prescription set using results from initial 6 min walk test and THRR while considering  patients activity barriers and safety.   Exercise Prescription Goal: Initial exercise prescription builds to 30-45 minutes a day of aerobic activity, 2-3 days per week.  Home exercise guidelines will be given to patient during program as part of exercise prescription that the participant will acknowledge.  Education: Aerobic Exercise & Resistance Training: - Gives group verbal and written instruction on the various components of exercise. Focuses on aerobic and resistive training programs and the benefits of this training and how to safely progress through these programs..   Education: Exercise & Equipment Safety: - Individual verbal instruction and demonstration of equipment use and safety with use of the equipment.   Pulmonary Rehab from 10/28/2019 in [facility] Cardiac and Pulmonary Rehab  Date  10/28/19  Educator  [facility]  Instruction Review Code  1- Verbalizes Understanding      Education: Exercise Physiology & General Exercise Guidelines: - Group verbal and written instruction with models to review the exercise physiology of the cardiovascular system and associated critical values. Provides general exercise guidelines with specific guidelines to those with heart or lung  disease.    Education: Flexibility, Balance, Mind/Body Relaxation: Provides group verbal/written instruction on the benefits of flexibility and balance training, including mind/body exercise modes such as yoga, pilates and tai chi.  Demonstration and skill practice provided.   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 HR  95 bpm     Max Ex. BP  146/72     2 Minute Post BP  134/70       Interval HR   1 Minute HR  82     2 Minute HR  89     3 Minute HR  89     4 Minute HR  95     5 Minute HR  92     6 Minute HR  93     2 Minute Post HR  67     Interval Heart Rate?  Yes       Interval Oxygen   Interval Oxygen?  Yes     Baseline Oxygen Saturation %  95 %     1 Minute Oxygen Saturation %  89 %     1 Minute Liters of Oxygen  0 L Room Air     2 Minute Oxygen Saturation %  86 %     2 Minute Liters of Oxygen  0 L     3 Minute Oxygen Saturation %  82 %     3 Minute Liters of Oxygen  0 L     4 Minute Oxygen Saturation %  81 %     4 Minute Liters of Oxygen  0 L     5 Minute Oxygen Saturation %  80 %     5 Minute Liters of Oxygen  0 L     6 Minute Oxygen Saturation %  79 %     6 Minute Liters of Oxygen  0 L     2 Minute  Post Oxygen Saturation %  91 %     2 Minute Post Liters of Oxygen  0 L       Oxygen Initial Assessment: Oxygen Initial Assessment - 11/03/19 0835      Home Oxygen   Home Oxygen Device  Home Concentrator;E-Tanks    Sleep Oxygen Prescription  Continuous    Liters per minute  2    Home Exercise Oxygen Prescription  Continuous    Liters per minute  2    Home at Rest Exercise Oxygen Prescription  None    Liters per minute  0    Compliance with Home Oxygen Use  Yes       Oxygen Re-Evaluation: Oxygen Re-Evaluation    Row Name 11/03/19 0843 11/25/19 0742           Program Oxygen Prescription   Program Oxygen Prescription  E-Tanks;Continuous  E-Tanks;Continuous      Liters per minute  2  3      Comments  Required oxygen 2 L  while exercising  Required oxygen 3 L  while exercising        Home Oxygen   Home Oxygen Device  Home Concentrator;E-Tanks  Home Concentrator;E-Tanks      Sleep Oxygen Prescription  Continuous  Continuous      Liters per minute  2  2 may use 3L when worked hard during the day (a couple times a week)      Home Exercise Oxygen Prescription  Continuous  Continuous      Liters per minute  2  3      Home at Rest Exercise Oxygen Prescription  None  --      Liters per minute  0  0      Compliance with Home Oxygen Use  Yes  Yes        Goals/Expected Outcomes   Short Term Goals  To learn and exhibit compliance with exercise, home and travel O2 prescription;To learn and understand importance of monitoring SPO2 with pulse oximeter and demonstrate accurate use of the pulse oximeter.;To learn and understand importance of maintaining oxygen saturations>88%;To learn and demonstrate proper pursed lip breathing techniques or other breathing techniques.  To learn and exhibit compliance with exercise, home and travel O2 prescription;To learn and understand importance of monitoring SPO2 with pulse oximeter and demonstrate accurate use of the pulse oximeter.;To learn and understand  importance of maintaining oxygen saturations>88%;To learn and demonstrate proper pursed lip breathing techniques or other breathing techniques.      Long  Term Goals  Exhibits compliance with exercise, home and travel O2 prescription;Verbalizes importance of monitoring SPO2 with pulse oximeter and return demonstration;Maintenance of O2 saturations>88%;Exhibits proper breathing techniques, such as pursed lip breathing or other method taught during program session  Exhibits compliance with exercise, home and travel O2 prescription;Verbalizes importance of monitoring SPO2 with pulse oximeter and return demonstration;Maintenance of O2 saturations>88%;Exhibits proper breathing techniques, such as pursed lip breathing or other method taught during program session      Comments  Reviewed PLB technique with pt.  Talked about how it works and it's importance in maintaining their exercise saturations.  Reviewed PLB technique with pt.  Talked about how it works and it's importance in maintaining their exercise saturations.      Goals/Expected Outcomes  Short: Become more profiecient at using PLB.   Long: Become independent at using PLB.  Short: Become more profiecient at using PLB.   Long: Become independent at using PLB.         Oxygen Discharge (Final Oxygen Re-Evaluation): Oxygen Re-Evaluation - 11/25/19 0742      Program Oxygen Prescription   Program Oxygen Prescription  E-Tanks;Continuous    Liters per minute  3    Comments  Required oxygen 3 L  while exercising      Home Oxygen   Home Oxygen Device  Home Concentrator;E-Tanks    Sleep Oxygen Prescription  Continuous    Liters per minute  2   may use 3L when worked hard during the day (a couple times a week)   Home Exercise Oxygen Prescription  Continuous    Liters per minute  3    Liters per minute  0    Compliance with Home Oxygen Use  Yes      Goals/Expected Outcomes   Short Term Goals  To learn and exhibit compliance with exercise, home and  travel O2 prescription;To learn and understand importance of monitoring SPO2 with pulse oximeter and demonstrate accurate use of the pulse oximeter.;To learn and understand importance  of maintaining oxygen saturations>88%;To learn and demonstrate proper pursed lip breathing techniques or other breathing techniques.    Long  Term Goals  Exhibits compliance with exercise, home and travel O2 prescription;Verbalizes importance of monitoring SPO2 with pulse oximeter and return demonstration;Maintenance of O2 saturations>88%;Exhibits proper breathing techniques, such as pursed lip breathing or other method taught during program session    Comments  Reviewed PLB technique with pt.  Talked about how it works and it's importance in maintaining their exercise saturations.    Goals/Expected Outcomes  Short: Become more profiecient at using PLB.   Long: Become independent at using PLB.       Initial Exercise Prescription: Initial Exercise Prescription - 10/28/19 1100      Date of Initial Exercise RX and Referring Provider   Date  10/28/19    Referring Provider  Brand Males MD      Oxygen   Oxygen  Continuous    Liters  2      Treadmill   MPH  2.4    Grade  0.5    Minutes  15    METs  3      NuStep   Level  3    SPM  80    Minutes  15    METs  3      Elliptical   Level  1    Speed  3    Minutes  15    METs  3      Biostep-RELP   Level  3    SPM  50    Minutes  15    METs  3      Prescription Details   Frequency (times per week)  2    Duration  Progress to 30 minutes of continuous aerobic without signs/symptoms of physical distress      Intensity   THRR 40-80% of Max Heartrate  98-138    Ratings of Perceived Exertion  11-13    Perceived Dyspnea  0-4      Progression   Progression  Continue to progress workloads to maintain intensity without signs/symptoms of physical distress.      Resistance Training   Training Prescription  Yes    Weight  5 lb    Reps  10-15         Perform Capillary Blood Glucose checks as needed.  Exercise Prescription Changes: Exercise Prescription Changes    Row Name 10/28/19 1100 11/10/19 0900 11/23/19 1500         Response to Exercise   Blood Pressure (Admit)  130/74  110/68  104/60     Blood Pressure (Exercise)  146/72  124/78  134/74     Blood Pressure (Exit)  132/70  104/60  106/60     Heart Rate (Admit)  60 bpm  69 bpm  70 bpm     Heart Rate (Exercise)  95 bpm  90 bpm  97 bpm     Heart Rate (Exit)  62 bpm  74 bpm  74 bpm     Oxygen Saturation (Admit)  95 %  91 %  98 %     Oxygen Saturation (Exercise)  79 %  92 %  82 %     Oxygen Saturation (Exit)  95 %  94 %  98 %     Rating of Perceived Exertion (Exercise)  '7  11  13     ' Perceived Dyspnea (Exercise)  1  3  3  Symptoms  none  SOB  SOB     Comments  walk test results  --  --     Duration  --  Continue with 30 min of aerobic exercise without signs/symptoms of physical distress.  Continue with 30 min of aerobic exercise without signs/symptoms of physical distress.     Intensity  --  THRR unchanged  THRR unchanged       Progression   Progression  --  Continue to progress workloads to maintain intensity without signs/symptoms of physical distress.  Continue to progress workloads to maintain intensity without signs/symptoms of physical distress.     Average METs  --  3  4 4L on TM        Resistance Training   Training Prescription  --  Yes  Yes     Weight  --  5 lb  5 lb     Reps  --  10-15  10-15       Interval Training   Interval Training  --  No  No       Oxygen   Oxygen  --  Continuous  Continuous     Liters  --  2-3  3-4       Treadmill   MPH  --  2.4  2.4     Grade  --  0.5  1     Minutes  --  15  15     METs  --  3  3.17       REL-XR   Level  --  3  4     Minutes  --  15  15        Exercise Comments: Exercise Comments    Row Name 11/03/19 0829 11/03/19 0830 12/01/19 0843       Exercise Comments  First full day of exercise!  Patient  was oriented to gym and equipment including functions, settings, policies, and procedures.  Patient's individual exercise prescription and treatment plan were reviewed.  All starting workloads were established based on the results of the 6 minute walk test done at initial orientation visit.  The plan for exercise progression was also introduced and progression will be customized based on patient's performance and goals.  Dropped to 80% on TM  Placed  2l per nasal canuula oxygen on pJohn   Sats back up above 90%  Today while on the TM, sats dropped to 85%. Cued PLB and decreased Workload to get sat to 88%        Exercise Goals and Review: Exercise Goals    Row Name 10/28/19 1144             Exercise Goals   Increase Physical Activity  Yes       Intervention  Provide advice, education, support and counseling about physical activity/exercise needs.;Develop an individualized exercise prescription for aerobic and resistive training based on initial evaluation findings, risk stratification, comorbidities and participant's personal goals.       Expected Outcomes  Short Term: Attend rehab on a regular basis to increase amount of physical activity.;Long Term: Add in home exercise to make exercise part of routine and to increase amount of physical activity.;Long Term: Exercising regularly at least 3-5 days a week.       Increase Strength and Stamina  Yes       Intervention  Provide advice, education, support and counseling about physical activity/exercise needs.;Develop an individualized exercise prescription for aerobic and resistive training based on initial  evaluation findings, risk stratification, comorbidities and participant's personal goals.       Expected Outcomes  Short Term: Increase workloads from initial exercise prescription for resistance, speed, and METs.;Short Term: Perform resistance training exercises routinely during rehab and add in resistance training at home;Long Term: Improve  cardiorespiratory fitness, muscular endurance and strength as measured by increased METs and functional capacity (6MWT)       Able to understand and use rate of perceived exertion (RPE) scale  Yes       Intervention  Provide education and explanation on how to use RPE scale       Expected Outcomes  Short Term: Able to use RPE daily in rehab to express subjective intensity level;Long Term:  Able to use RPE to guide intensity level when exercising independently       Able to understand and use Dyspnea scale  Yes       Intervention  Provide education and explanation on how to use Dyspnea scale       Expected Outcomes  Short Term: Able to use Dyspnea scale daily in rehab to express subjective sense of shortness of breath during exertion;Long Term: Able to use Dyspnea scale to guide intensity level when exercising independently       Knowledge and understanding of Target Heart Rate Range (THRR)  Yes       Intervention  Provide education and explanation of THRR including how the numbers were predicted and where they are located for reference       Expected Outcomes  Short Term: Able to state/look up THRR;Short Term: Able to use daily as guideline for intensity in rehab;Long Term: Able to use THRR to govern intensity when exercising independently       Able to check pulse independently  Yes       Intervention  Provide education and demonstration on how to check pulse in carotid and radial arteries.;Review the importance of being able to check your own pulse for safety during independent exercise       Expected Outcomes  Short Term: Able to explain why pulse checking is important during independent exercise;Long Term: Able to check pulse independently and accurately       Understanding of Exercise Prescription  Yes       Intervention  Provide education, explanation, and written materials on patient's individual exercise prescription       Expected Outcomes  Long Term: Able to explain home exercise  prescription to exercise independently;Short Term: Able to explain program exercise prescription          Exercise Goals Re-Evaluation : Exercise Goals Re-Evaluation    Row Name 11/03/19 0834 11/10/19 0909 11/23/19 1535 11/25/19 0748       Exercise Goal Re-Evaluation   Exercise Goals Review  Knowledge and understanding of Target Heart Rate Range (THRR);Able to understand and use Dyspnea scale;Able to understand and use rate of perceived exertion (RPE) scale;Understanding of Exercise Prescription  Increase Physical Activity;Increase Strength and Stamina;Understanding of Exercise Prescription  Increase Physical Activity;Increase Strength and Stamina;Able to understand and use rate of perceived exertion (RPE) scale;Able to understand and use Dyspnea scale;Knowledge and understanding of Target Heart Rate Range (THRR);Able to check pulse independently;Understanding of Exercise Prescription  Increase Physical Activity;Increase Strength and Stamina;Able to understand and use rate of perceived exertion (RPE) scale;Able to understand and use Dyspnea scale;Knowledge and understanding of Target Heart Rate Range (THRR);Able to check pulse independently;Understanding of Exercise Prescription    Comments  Reviewed RPE scale, THR  and program prescription with pt today.  Pt voiced understanding and was given a copy of goals to take home.  Estefan is off to a good start in rehab.  He is exercising on 2-3L to maintain his oxygen saturations. He has completed his first three full days of exercise.  We will continue to monitor his progress.  Lon had to use 4L on TM due to low O2.  He did increase incline to 1%.  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 Pt will rest when he gets SOB at work because does not wear oxygen.    Expected Outcomes  Short: Use RPE daily to regulate intensity. Long: Follow program prescription in  THR.  Short: Continue to attend rehab regularly  Long: Continue to improve stamina and maintain saturations.  Short:  exercise consistently and keep O2 above 88%  Long: improve MET level  Short:  exercise consistently and keep O2 above 88%  Long: improve MET level       Discharge Exercise Prescription (Final Exercise Prescription Changes): Exercise Prescription Changes - 11/23/19 1500      Response to Exercise   Blood Pressure (Admit)  104/60    Blood Pressure (Exercise)  134/74    Blood Pressure (Exit)  106/60    Heart Rate (Admit)  70 bpm    Heart Rate (Exercise)  97 bpm    Heart Rate (Exit)  74 bpm    Oxygen Saturation (Admit)  98 %    Oxygen Saturation (Exercise)  82 %    Oxygen Saturation (Exit)  98 %    Rating of Perceived Exertion (Exercise)  13    Perceived Dyspnea (Exercise)  3    Symptoms  SOB    Duration  Continue with 30 min of aerobic exercise without signs/symptoms of physical distress.    Intensity  THRR unchanged      Progression   Progression  Continue to progress workloads to maintain intensity without signs/symptoms of physical distress.    Average METs  4   4L on TM      Resistance Training   Training Prescription  Yes    Weight  5 lb    Reps  10-15      Interval Training   Interval Training  No      Oxygen   Oxygen  Continuous    Liters  3-4      Treadmill   MPH  2.4    Grade  1    Minutes  15    METs  3.17      REL-XR   Level  4    Minutes  15       Nutrition:  Target Goals: Understanding of nutrition guidelines, daily intake of sodium <153m, cholesterol <2078m calories 30% from fat and 7% or less from saturated fats, daily to have 5 or more servings of fruits and vegetables.  Education: Controlling Sodium/Reading Food Labels -Group verbal and written material supporting the discussion of sodium use in heart healthy nutrition. Review and explanation with models, verbal and written materials for utilization of the food  label.   Education: General Nutrition Guidelines/Fats and Fiber: -Group instruction provided by verbal, written material, models and posters to present the general guidelines for heart healthy nutrition. Gives an explanation and review of dietary fats and fiber.   Biometrics: Pre Biometrics - 10/28/19 1145  Pre Biometrics   Height  5' 10.75" (1.797 m)    Weight  168 lb 14.4 oz (76.6 kg)    BMI (Calculated)  23.73    Single Leg Stand  30 seconds        Nutrition Therapy Plan and Nutrition Goals:   Nutrition Assessments: Nutrition Assessments - 10/27/19 1118      MEDFICTS Scores   Pre Score  23       MEDIFICTS Score Key:          ?70 Need to make dietary changes          40-70 Heart Healthy Diet         ? 40 Therapeutic Level Cholesterol Diet  Nutrition Goals Re-Evaluation:   Nutrition Goals Discharge (Final Nutrition Goals Re-Evaluation):   Psychosocial: Target Goals: Acknowledge presence or absence of significant depression and/or stress, maximize coping skills, provide positive support system. Participant is able to verbalize types and ability to use techniques and skills needed for reducing stress and depression.   Education: Depression - Provides group verbal and written instruction on the correlation between heart/lung disease and depressed mood, treatment options, and the stigmas associated with seeking treatment.   Education: Sleep Hygiene -Provides group verbal and written instruction about how sleep can affect your health.  Define sleep hygiene, discuss sleep cycles and impact of sleep habits. Review good sleep hygiene tips.    Education: Stress and Anxiety: - Provides group verbal and written instruction about the health risks of elevated stress and causes of high stress.  Discuss the correlation between heart/lung disease and anxiety and treatment options. Review healthy ways to manage with stress and anxiety.   Initial Review & Psychosocial  Screening: Initial Psych Review & Screening - 10/27/19 0846      Initial Review   Current issues with  Current Stress Concerns    Source of Stress Concerns  Chronic Illness;Unable to participate in former interests or hobbies;Unable to perform yard/household activities    Comments  Pulmonary Fibrosis and Arthritis keep him from doing everything that he wants      [place]?  Yes   both have several children, grandkids, neighbors     Barriers   Psychosocial barriers to participate in program  The patient should benefit from training in stress management and relaxation.;Psychosocial barriers identified (see note)      Screening Interventions   Interventions  Encouraged to exercise;To provide support and resources with identified psychosocial needs;Provide feedback about the scores to participant    Expected Outcomes  Short Term goal: Utilizing psychosocial counselor, staff and physician to assist with identification of specific Stressors or current issues interfering with healing process. Setting desired goal for each stressor or current issue identified.;Long Term Goal: Stressors or current issues are controlled or eliminated.;Short Term goal: Identification and review with participant of any Quality of Life or Depression concerns found by scoring the questionnaire.;Long Term goal: The participant improves quality of Life and PHQ9 Scores as seen by post scores and/or verbalization of changes       Quality of Life Scores:  Scores of 19 and below usually indicate a poorer quality of life in these areas.  A difference of  2-3 points is a clinically meaningful difference.  A difference of 2-3 points in the total score of the Quality of Life Index has been associated with significant improvement in overall quality of life, self-image, physical symptoms, and general health in studies assessing change  in quality of life.  PHQ-9: Recent Review Flowsheet Data     Depression screen [facility] 2/9 12/01/2019 10/28/2019 09/08/2019 07/19/2019 05/07/2017   Decreased Interest 0 1 0 0 0   Down, Depressed, Hopeless 0 0 0 0 0   PHQ - 2 Score 0 1 0 0 0   Altered sleeping 1 2 - - -   Tired, decreased energy 1 1 - - -   Change in appetite 0 0 - - -   Feeling bad or failure about yourself  1 1 - - -   Trouble concentrating 0 0 - - -   Moving slowly or fidgety/restless 1 1 - - -   Suicidal thoughts 0 2  - - -   PHQ-9 Score 4 8 - - -   Difficult doing work/chores - Not difficult at all - - -     Interpretation of Total Score  Total Score Depression Severity:  1-4 = Minimal depression, 5-9 = Mild depression, 10-14 = Moderate depression, 15-19 = Moderately severe depression, 20-27 = Severe depression   Psychosocial Evaluation and Intervention: Psychosocial Evaluation - 10/27/19 0855      Psychosocial Evaluation & Interventions   Interventions  Stress management education;Encouraged to exercise with the program and follow exercise prescription    Comments  Maika 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  He has always been an avid exerciser at [employer] prior to the pandemic last year.  He has rhuematoid arthritis all over which also limits his ability to do things and keeps him in chronic pain.  He has a strong support system in his wife.  He is coming to the program after his doctor recommended that he should be supervised as he gets back into his exercise routine.  He wants to get back to exercise and feel better again.  He wants to be able to breathe and able to do more.    Expected Outcomes  Short: Attend rehab to get back into exercise routine.  Long: Continue to improve breathing.    Continue Psychosocial Services   Follow up required by staff       Psychosocial Re-Evaluation: Psychosocial Re-Evaluation    [place] Name 11/25/19 [phone]             Psychosocial Re-Evaluation   Current issues with  Current Sleep Concerns       Comments  Pt  reports normal sleep is tossing and turning - sometimes will have coughing spells or will get too hot. Pt reports getting enough sleep at night; 8pm-6am.       Expected Outcomes  Pt will continue to manage stress       Interventions  Encouraged to attend Pulmonary Rehabilitation for the exercise       Continue Psychosocial Services   Follow up required by staff          Psychosocial Discharge (Final Psychosocial Re-Evaluation): Psychosocial Re-Evaluation - 11/25/19 0744      Psychosocial Re-Evaluation   Current issues with  Current Sleep Concerns    Comments  Pt reports normal sleep is tossing and turning - sometimes will have coughing spells or will get too hot. Pt reports getting enough sleep at night; 8pm-6am.    Expected Outcomes  Pt will continue to manage stress    Interventions  Encouraged to attend Pulmonary Rehabilitation for the exercise    Continue Psychosocial Services   Follow up required by staff  Education: Education Goals: Education classes will be provided on a weekly basis, covering required topics. Participant will state understanding/return demonstration of topics presented.  Learning Barriers/Preferences: Learning Barriers/Preferences - 10/27/19 0842      Learning Barriers/Preferences   Learning Barriers  None    Learning Preferences  None       General Pulmonary Education Topics:  Infection Prevention: - Provides verbal and written material to individual with discussion of infection control including proper hand washing and proper equipment cleaning during exercise session.   Pulmonary Rehab from 10/28/2019 in [facility] Cardiac and Pulmonary Rehab  Date  10/28/19  Educator  [facility]  Instruction Review Code  1- Verbalizes Understanding      Falls Prevention: - Provides verbal and written material to individual with discussion of falls prevention and safety.   Pulmonary Rehab from 10/28/2019 in [facility] Cardiac and Pulmonary Rehab  Date  10/28/19  Educator  [facility]   Instruction Review Code  1- Verbalizes Understanding      Chronic Lung Diseases: - Group verbal and written instruction to review updates, respiratory medications, advancements in procedures and treatments. Discuss use of supplemental oxygen including available portable oxygen systems, continuous and intermittent flow rates, concentrators, personal use and safety guidelines. Review proper use of inhaler and spacers. Provide informative websites for self-education.    Energy Conservation: - Provide group verbal and written instruction for methods to conserve energy, plan and organize activities. Instruct on pacing techniques, use of adaptive equipment and posture/positioning to relieve shortness of breath.   Triggers and Exacerbations: - Group verbal and written instruction to review types of environmental triggers and ways to prevent exacerbations. Discuss weather changes, air quality and the benefits of nasal washing. Review warning signs and symptoms to help prevent infections. Discuss techniques for effective airway clearance, coughing, and vibrations.   AED/CPR: - Group verbal and written instruction with the use of models to demonstrate the basic use of the AED with the basic ABC's of resuscitation.   Anatomy and Physiology of the Lungs: - Group verbal and written instruction with the use of models to provide basic lung anatomy and physiology related to function, structure and complications of lung disease.   Anatomy & Physiology of the Heart: - Group verbal and written instruction and models provide basic cardiac anatomy and physiology, with the coronary electrical and arterial systems. Review of Valvular disease and Heart Failure   Cardiac Medications: - Group verbal and written instruction to review commonly prescribed medications for heart disease. Reviews the medication, class of the drug, and side effects.   Other: -Provides group and verbal instruction on various topics  (see comments)   Knowledge Questionnaire Score: Knowledge Questionnaire Score - 10/27/19 1118      Knowledge Questionnaire Score   Pre Score  15/18 Education Focus: O2 Safety and Exercise        Core Components/Risk Factors/Patient Goals at Admission: Personal Goals and Risk Factors at Admission - 10/28/19 1145      Core Components/Risk Factors/Patient Goals on Admission    Weight Management  Yes;Weight Maintenance    Intervention  Weight Management: Develop a combined nutrition and exercise program designed to reach desired caloric intake, while maintaining appropriate intake of nutrient and fiber, sodium and fats, and appropriate energy expenditure required for the weight goal.;Weight Management: Provide education and appropriate resources to help participant work on and attain dietary goals.    Admit Weight  168 lb 14.4 oz (76.6 kg)    Goal Weight: Short Term  165 lb (74.8 kg)    Goal Weight: Long Term  165 lb (74.8 kg)    Expected Outcomes  Short Term: Continue to assess and modify interventions until short term weight is achieved;Long Term: Adherence to nutrition and physical activity/exercise program aimed toward attainment of established weight goal;Weight Maintenance: Understanding of the daily nutrition guidelines, which includes 25-35% calories from fat, 7% or less cal from saturated fats, less than 258m cholesterol, less than 1.5gm of sodium, & 5 or more servings of fruits and vegetables daily    Improve shortness of breath with ADL's  Yes    Intervention  Provide education, individualized exercise plan and daily activity instruction to help decrease symptoms of SOB with activities of daily living.    Expected Outcomes  Short Term: Improve cardiorespiratory fitness to achieve a reduction of symptoms when performing ADLs;Long Term: Be able to perform more ADLs without symptoms or delay the onset of symptoms    Lipids  Yes    Intervention  Provide education and support for  participant on nutrition & aerobic/resistive exercise along with prescribed medications to achieve LDL <713m HDL >4048m   Expected Outcomes  Short Term: Participant states understanding of desired cholesterol values and is compliant with medications prescribed. Participant is following exercise prescription and nutrition guidelines.;Long Term: Cholesterol controlled with medications as prescribed, with individualized exercise RX and with personalized nutrition plan. Value goals: LDL < 63m49mDL > 40 mg.       Education:Diabetes - Individual verbal and written instruction to review signs/symptoms of diabetes, desired ranges of glucose level fasting, after meals and with exercise. Acknowledge that pre and post exercise glucose checks will be done for 3 sessions at entry of program.   Education: Know Your Numbers and Risk Factors: -Group verbal and written instruction about important numbers in your health.  Discussion of what are risk factors and how they play a role in the disease process.  Review of Cholesterol, Blood Pressure, Diabetes, and BMI and the role they play in your overall health.   Core Components/Risk Factors/Patient Goals Review:  Goals and Risk Factor Review    Row Name 11/25/19 0747             Core Components/Risk Factors/Patient Goals Review   Personal Goals Review  Develop more efficient breathing techniques such as purse lipped breathing and diaphragmatic breathing and practicing self-pacing with activity.       Review  Pt reports not having any significant health concerns.       Expected Outcomes  Continue to manage health and practice PLB.          Core Components/Risk Factors/Patient Goals at Discharge (Final Review):  Goals and Risk Factor Review - 11/25/19 0747      Core Components/Risk Factors/Patient Goals Review   Personal Goals Review  Develop more efficient breathing techniques such as purse lipped breathing and diaphragmatic breathing and practicing  self-pacing with activity.    Review  Pt reports not having any significant health concerns.    Expected Outcomes  Continue to manage health and practice PLB.       ITP Comments: ITP Comments    Row Name 10/27/19 0912 10/28/19 1107 11/03/19 0828 11/10/19 0552 12/01/19 0845   ITP Comments  Completed virtual orientation today.  EP evaluation is scheduled for Thursday 3/11 at 930am.  [facility]  Completed 6MWT and gym orientation.  Initial ITP created and sent for review to  Dr. Emily Filbert, Medical Director.  First full day of exercise!  Patient was oriented to gym and equipment including functions, settings, policies, and procedures.  Patient's individual exercise prescription and treatment plan were reviewed.  All starting workloads were established based on the results of the 6 minute walk test done at initial orientation visit.  The plan for exercise progression was also introduced and progression will be customized based on patient's performance and goals.  30 day chart review completed. ITP sent to Dr Zachery Dakins Medical Director, for review,changes as needed and signature. Continue with ITP if no changes requested  Today while on the TM, sats dropped to 85%. Cued PLB and decreased Workload to get sat to 88%   Row Name 12/08/19 0530           ITP Comments  30 Day review completed. Medical Director review done, changes made as directed,and approval shown by signature of Market researcher.          Comments:

## 2019-12-08 NOTE — Progress Notes (Signed)
Daily Session Note  Patient Details  Name: Lawrence Wells MRN: [id number] Date of Birth: 1956/03/17 Referring Provider:     Pulmonary Rehab from 10/28/2019 in [facility] Cardiac and Pulmonary Rehab  Referring Provider  Brand Males MD      Encounter Date: 12/08/2019  Check In: Session Check In - 12/08/19 0748      Check-In   Supervising physician immediately available to respond to emergencies  See telemetry face sheet for immediately available ER MD    Location  [facility]-Cardiac & Pulmonary Rehab    Staff Present  Heath Lark, RN, BSN, CCRP;Joseph Foy [place], [place], ACSM CEP, Exercise Physiologist;Jessica Bexley, MA, RCEP, CCRP, CCET    Virtual Visit  No    Medication changes reported      No    Fall or balance concerns reported     No    Warm-up and Cool-down  Performed on first and last piece of equipment    Resistance Training Performed  Yes    VAD Patient?  No    PAD/SET Patient?  No      Pain Assessment   Currently in Pain?  No/denies          Social History   Tobacco Use  Smoking Status Former Smoker  . Packs/day: 1.50  . Years: 30.00  . Pack years: 45.00  . Types: Cigarettes  . Quit date: 2007  . Years since quitting: 14.3  Smokeless Tobacco Former Systems developer  . Types: Chew  . Quit date: 2007  Tobacco Comment   Dip smokeless tobacco >20-30 years    Goals Met:  Proper associated with RPD/PD & O2 Sat Independence with exercise equipment Exercise tolerated well No report of cardiac concerns or symptoms  Goals Unmet:  O2 Sat  Dropping to 85% today   Oxygen increased to 4 L  Comments: Pt able to follow exercise prescription today without complaint.  Will continue to monitor for progression.    Dr. Emily Filbert is Medical Director for [place] and [facility].

## 2019-12-09 ENCOUNTER — Other Ambulatory Visit: Payer: Self-pay

## 2019-12-09 ENCOUNTER — Ambulatory Visit: Payer: PPO | Admitting: Rheumatology

## 2019-12-09 ENCOUNTER — Encounter: Payer: PPO | Admitting: *Deleted

## 2019-12-09 DIAGNOSIS — J849 Interstitial pulmonary disease, unspecified: Secondary | ICD-10-CM

## 2019-12-09 NOTE — Progress Notes (Signed)
Daily Session Note  Patient Details  Name: Lawrence Wells MRN: [id number] Date of Birth: 11-17-1955 Referring Provider:     Pulmonary Rehab from 10/28/2019 in [facility] Cardiac and Pulmonary Rehab  Referring Provider  Brand Males MD      Encounter Date: 12/09/2019  Check In: Session Check In - 12/09/19 0759      Check-In   Supervising physician immediately available to respond to emergencies  See telemetry face sheet for immediately available ER MD    Location  [facility]-Cardiac & Pulmonary Rehab    Staff Present  Heath Lark, RN, BSN, CCRP;Amanda Sommer, BA, ACSM CEP, Exercise Physiologist;Melissa Caiola RDN, LDN    Virtual Visit  No    Medication changes reported      No    Fall or balance concerns reported     No    Warm-up and Cool-down  Performed on first and last piece of equipment    Resistance Training Performed  Yes    VAD Patient?  No    PAD/SET Patient?  No      Pain Assessment   Currently in Pain?  No/denies          Social History   Tobacco Use  Smoking Status Former Smoker  . Packs/day: 1.50  . Years: 30.00  . Pack years: 45.00  . Types: Cigarettes  . Quit date: 2007  . Years since quitting: 14.3  Smokeless Tobacco Former Systems developer  . Types: Chew  . Quit date: 2007  Tobacco Comment   Dip smokeless tobacco >20-30 years    Goals Met:  Proper associated with RPD/PD & O2 Sat Independence with exercise equipment Exercise tolerated well No report of cardiac concerns or symptoms  Goals Unmet:  Not Applicable  Comments: Pt able to follow exercise prescription today without complaint.  Will continue to monitor for progression.  Reviewed home exercise with pt today.  Pt plans to walk and go to [employer] for exercise.  Reviewed THR, pulse, RPE, sign and symptoms, NTG use, and when to call 911 or MD.  Also discussed weather considerations and indoor options.  Pt voiced understanding.   Dr. Emily Filbert is Medical Director for [place] and [facility].

## 2019-12-09 NOTE — Progress Notes (Signed)
Completed Initial RD Eval 

## 2019-12-10 DIAGNOSIS — J849 Interstitial pulmonary disease, unspecified: Secondary | ICD-10-CM | POA: Diagnosis not present

## 2019-12-13 ENCOUNTER — Other Ambulatory Visit: Payer: Self-pay | Admitting: Internal Medicine

## 2019-12-13 DIAGNOSIS — J8410 Pulmonary fibrosis, unspecified: Secondary | ICD-10-CM

## 2019-12-15 ENCOUNTER — Ambulatory Visit: Payer: PPO | Attending: Internal Medicine

## 2019-12-15 ENCOUNTER — Other Ambulatory Visit: Payer: Self-pay

## 2019-12-15 ENCOUNTER — Encounter: Payer: PPO | Admitting: *Deleted

## 2019-12-15 DIAGNOSIS — J849 Interstitial pulmonary disease, unspecified: Secondary | ICD-10-CM | POA: Diagnosis not present

## 2019-12-15 DIAGNOSIS — Z23 Encounter for immunization: Secondary | ICD-10-CM

## 2019-12-15 NOTE — Progress Notes (Signed)
Daily Session Note  Patient Details  Name: Lawrence Wells MRN: [id number] Date of Birth: 01-08-1956 Referring Provider:     Pulmonary Rehab from 10/28/2019 in [facility] Cardiac and Pulmonary Rehab  Referring Provider  Brand Males MD      Encounter Date: 12/15/2019  Check In: Session Check In - 12/15/19 0758      Check-In   Supervising physician immediately available to respond to emergencies  See telemetry face sheet for immediately available ER MD    Location  [facility]-Cardiac & Pulmonary Rehab    Staff Present  Nada Maclachlan, BA, ACSM CEP, Exercise Physiologist;Joseph Flavia Shipper;Heath Lark, RN, BSN, CCRP    Virtual Visit  No    Medication changes reported      No    Fall or balance concerns reported     No    Warm-up and Cool-down  Performed on first and last piece of equipment    Resistance Training Performed  Yes    VAD Patient?  No    PAD/SET Patient?  No      Pain Assessment   Currently in Pain?  No/denies          Social History   Tobacco Use  Smoking Status Former Smoker  . Packs/day: 1.50  . Years: 30.00  . Pack years: 45.00  . Types: Cigarettes  . Quit date: 2007  . Years since quitting: 14.3  Smokeless Tobacco Former Systems developer  . Types: Chew  . Quit date: 2007  Tobacco Comment   Dip smokeless tobacco >20-30 years    Goals Met:  Proper associated with RPD/PD & O2 Sat Independence with exercise equipment Exercise tolerated well No report of cardiac concerns or symptoms  Goals Unmet:  Not Applicable  Comments: Pt able to follow exercise prescription today without complaint.  Will continue to monitor for progression.    Dr. Emily Filbert is Medical Director for [place] and [facility].

## 2019-12-15 NOTE — Progress Notes (Signed)
   Covid-19 Vaccination Clinic  Name:  Lawrence Wells    MRN: [id number] DOB: 1956/02/13  12/15/2019  [name] was observed post Covid-19 immunization for 15 minutes without incident. He was provided with Vaccine Information Sheet and instruction to access the V-Safe system.   [name] was instructed to call 911 with any severe reactions post vaccine: Marland Kitchen Difficulty breathing  . Swelling of face and throat  . A fast heartbeat  . A bad rash all over body  . Dizziness and weakness   Immunizations Administered    Name Date Dose VIS Date Route   Pfizer COVID-19 Vaccine 12/15/2019 10:15 AM 0.3 mL 10/13/2018 Intramuscular   Manufacturer: Lane   Lot: [id number]   [place]: [id number]

## 2019-12-16 ENCOUNTER — Encounter: Payer: PPO | Admitting: *Deleted

## 2019-12-16 ENCOUNTER — Other Ambulatory Visit: Payer: Self-pay

## 2019-12-16 DIAGNOSIS — J849 Interstitial pulmonary disease, unspecified: Secondary | ICD-10-CM | POA: Diagnosis not present

## 2019-12-16 NOTE — Progress Notes (Signed)
Daily Session Note  Patient Details  Name: REINALDO HELT MRN: [id number] Date of Birth: 07-17-1956 Referring Provider:     Pulmonary Rehab from 10/28/2019 in [facility] Cardiac and Pulmonary Rehab  Referring Provider  Brand Males MD      Encounter Date: 12/16/2019  Check In: Session Check In - 12/16/19 0830      Check-In   Supervising physician immediately available to respond to emergencies  See telemetry face sheet for immediately available ER MD    Location  [facility]-Cardiac & Pulmonary Rehab    Staff Present  Carson Myrtle, BS, RRT, CPFT;Amanda Oletta Darter, BA, ACSM CEP, Exercise Physiologist;Jaelyn Bourgoin, RN, BSN, CCRP    Virtual Visit  No    Medication changes reported      No    Fall or balance concerns reported     No    Warm-up and Cool-down  Performed on first and last piece of equipment    Resistance Training Performed  Yes    VAD Patient?  No    PAD/SET Patient?  No      Pain Assessment   Currently in Pain?  No/denies          Social History   Tobacco Use  Smoking Status Former Smoker  . Packs/day: 1.50  . Years: 30.00  . Pack years: 45.00  . Types: Cigarettes  . Quit date: 2007  . Years since quitting: 14.3  Smokeless Tobacco Former Systems developer  . Types: Chew  . Quit date: 2007  Tobacco Comment   Dip smokeless tobacco >20-30 years    Goals Met:  Proper associated with RPD/PD & O2 Sat Independence with exercise equipment Exercise tolerated well No report of cardiac concerns or symptoms  Goals Unmet:  Not Applicable  Comments: Pt able to follow exercise prescription today without complaint.  Will continue to monitor for progression.    Dr. Emily Filbert is Medical Director for [place] and [facility].

## 2019-12-21 ENCOUNTER — Other Ambulatory Visit: Payer: Self-pay | Admitting: Rheumatology

## 2019-12-21 DIAGNOSIS — M159 Polyosteoarthritis, unspecified: Secondary | ICD-10-CM

## 2019-12-21 MED ORDER — NAPROXEN 500 MG PO TABS: 500.0000 mg | ORAL_TABLET | Freq: Two times a day (BID) | ORAL | 3 refills | Status: DC

## 2019-12-21 NOTE — Progress Notes (Signed)
Office Visit Note  Patient: Lawrence Wells             Date of Birth: 10-01-1955           MRN: [id number]             PCP: Olin Hauser, DO Referring: Nobie Putnam * Visit Date: 12/23/2019 Occupation: @GUAROCC @  Subjective:  Generalized pain   History of Present Illness: Lawrence Wells is a 64 y.o. male with history of seropositive rheumatoid arthritis, ILDs, and osteoarthritis.  He is taking Imuran 50 mg 2 tablets po once daily.  He states he has pain "head to toe."  He has not noticed any improvement since starting on Imuran. He denies any joint swelling.  He had shoulder joint cortisone injections bilaterally on 11/23/19, which only provided relief for 3 days.  He is going to pulmonary rehab two days a week.  He is followed by Dr. Chase Caller.  He is taking Ofev 150 mg 1 capsule twice daily.  He denies any increased shortness of breath or coughing.    Activities of Daily Living:  Patient reports morning stiffness for  2-3 hours.   Patient Reports nocturnal pain.  Difficulty dressing/grooming: Denies Difficulty climbing stairs: Reports Difficulty getting out of chair: Reports Difficulty using hands for taps, buttons, cutlery, and/or writing: Denies  Review of Systems  Constitutional: Positive for fatigue. Negative for night sweats.  HENT: Negative for mouth sores, mouth dryness and nose dryness.   Eyes: Positive for dryness. Negative for redness.  Respiratory: Positive for cough and difficulty breathing (Oxygen as needed ). Negative for hemoptysis and shortness of breath.   Cardiovascular: Negative for chest pain, palpitations, hypertension, irregular heartbeat and swelling in legs/feet.  Gastrointestinal: Negative for blood in stool, constipation and diarrhea.  Endocrine: Negative for increased urination.  Genitourinary: Negative for painful urination.  Musculoskeletal: Positive for arthralgias, joint pain, morning stiffness and muscle tenderness. Negative for  joint swelling, myalgias, muscle weakness and myalgias.  Skin: Negative for color change, rash, hair loss, nodules/bumps, skin tightness, ulcers and sensitivity to sunlight.  Allergic/Immunologic: Negative for susceptible to infections.  Neurological: Negative for dizziness, fainting, memory loss, night sweats and weakness.  Hematological: Negative for swollen glands.  Psychiatric/Behavioral: Negative for depressed mood and sleep disturbance. The patient is not nervous/anxious.     PMFS History:  Patient Active Problem List   Diagnosis Date Noted  . Coronary artery calcification 09/26/2019  . Pulmonary fibrosis ([place]) 09/08/2019  . Myalgia due to statin 09/08/2019  . Chronic hip pain, bilateral 06/09/2017  . Chronic bilateral low back pain with bilateral sciatica 06/09/2017  . Osteoarthritis of multiple joints 05/07/2017  . Genital herpes 05/07/2017  . Screening for prostate cancer 05/07/2017  . Hypercholesterolemia 03/30/2003  . Former smoker 02/26/1995    Past Medical History:  Diagnosis Date  . Arthritis   . COPD (chronic obstructive pulmonary disease) ([place])   . Coronary artery disease   . Dyspnea   . Emphysema lung ([place])   . Hyperlipidemia   . Pulmonary filariasis     Family History  Problem Relation Age of Onset  . Heart disease Mother   . Heart attack Mother 63  . Arthritis Mother   . Heart disease Father   . Heart attack Father 43  . Healthy Sister   . Hyperlipidemia Brother   . Throat cancer Brother 58  . Heart disease Brother   . Healthy Son   . Healthy Daughter    Past  Surgical History:  Procedure Laterality Date  . SINUS SURGERY WITH INSTATRAK     Social History   Social History Narrative  . Not on file   Immunization History  Administered Date(s) Administered  . PFIZER SARS-COV-2 Vaccination 11/22/2019, 12/15/2019  . Pneumococcal Conjugate-13 09/08/2019  . Tdap 04/19/2015, 05/07/2017     Objective: Vital Signs: BP 126/79 (BP Location: Right  Arm, Patient Position: Sitting, Cuff Size: Normal)   Pulse 67   Resp 17   Ht 5\' 11"  (1.803 m)   Wt 164 lb 3.2 oz (74.5 kg)   BMI 22.90 kg/m    Physical Exam Vitals and nursing note reviewed.  Constitutional:      Appearance: He is well-developed.  HENT:     Head: Normocephalic and atraumatic.  Eyes:     Conjunctiva/sclera: Conjunctivae normal.     Pupils: Pupils are equal, round, and reactive to light.  Pulmonary:     Effort: Pulmonary effort is normal.  Abdominal:     General: Bowel sounds are normal.     Palpations: Abdomen is soft.  Musculoskeletal:     Cervical back: Normal range of motion and neck supple.  Skin:    General: Skin is warm and dry.     Capillary Refill: Capillary refill takes less than 2 seconds.     Comments: Nail clubbing noted  Neurological:     Mental Status: He is alert and oriented to person, place, and time.  Psychiatric:        Behavior: Behavior normal.      Musculoskeletal Exam: C-spine limited ROM. Thoracic and lumbar spine good ROM. Shoulder joints have full ROM with discomfort. Elbows, wrist joints, MCPs, PIPs, and DIPs good ROM with no synovitis.  Complete fist formation bilaterally.  PIP and DIP thickening consistent fist formation bilaterally.  Hip joints, knee joints, ankle joints, MTPs, PIPs, and DIPs good ROM with no synovitis.  No warmth or effusion of knee joints.  No tenderness or swelling of ankle joints.   CDAI Exam: CDAI Score: 0.6  Patient Global: 3 mm; Provider Global: 3 mm Swollen: 0 ; Tender: 0  Joint Exam 12/23/2019   No joint exam has been documented for this visit   There is currently no information documented on the homunculus. Go to the Rheumatology activity and complete the homunculus joint exam.  Investigation: No additional findings.  Imaging: No results found.  Recent Labs: Lab Results  Component Value Date   WBC 14.3 (H) 11/18/2019   HGB 16.8 11/18/2019   PLT 335 11/18/2019   NA 140 11/18/2019   K  4.8 11/18/2019   CL 102 11/18/2019   CO2 26 11/18/2019   GLUCOSE 74 11/18/2019   BUN 24 11/18/2019   CREATININE 0.84 11/18/2019   BILITOT 0.5 12/02/2019   ALKPHOS 88 12/02/2019   AST 25 12/02/2019   ALT 29 12/02/2019   PROT 7.4 12/02/2019   ALBUMIN 3.9 12/02/2019   CALCIUM 9.4 11/18/2019   GFRAA 106 11/18/2019   QFTBGOLDPLUS NEGATIVE 10/21/2019    Speciality Comments: No specialty comments available.  Procedures:  No procedures performed Allergies: Atorvastatin   Assessment / Plan:     Visit Diagnoses: Rheumatoid arthritis involving multiple sites with positive rheumatoid factor (HCC) - Positive rheumatoid factor, anti-CCP negative, positive ANA, positive Ro.  Positive synovitis: He has no synovitis on exam. He has chronic pain in multiple joints including both shoulders, both wrists, and both knees. He has no inflammation on exam. He continues to experience morning  stiffness for several hours and nocturnal pain in both shoulders at night. His rheumatoid arthritis is well controlled on Imuran 100 mg by mouth daily. He has been tolerating Imuran without any side effects. His chronic pain is due to underlying osteoarthritis. He takes naproxen as needed for pain relief. He will continue taking Imuran as prescribed. A refill of Imuran will be sent to the pharmacy today. He was advised to notify us if he develops increased joint swelling. He will follow-up in the office in 5 months.  High risk medication use - Imuran 100 mg po daily. CBC and CMP were drawn on 11/18/2019. He will return for lab work in August and every 3 months. Standing orders for CBC and CMP are in place.  ILD (interstitial lung disease) (HCC) - Dr. Chase Caller.  He is taking Ofev 150 mg 1 capsule twice daily.  He continues to go to pulmonary rehab twice weekly. He is not experiencing any increased shortness of breath or coughing. He uses oxygen as needed at home.  Clubbing of nails  Arthritis of both acromioclavicular  joints: X-rays of both shoulders were obtained on 11/23/2019 which revealed acromioclavicular arthritis of both shoulders. He had bilateral glenohumeral joint cortisone injections on 11/23/2019 which provided relief for about 3 days. He has good range of motion of both shoulder joints on exam with discomfort bilaterally. He experiences nocturnal pain in both shoulders. No warmth or effusion was noted on exam.  Primary osteoarthritis of right knee: Mild osteoarthritis-He has good range of motion of the right knee joint on exam. No warmth or effusion was noted.  Primary osteoarthritis of both feet: He is not experiencing any discomfort in his feet at this time. He was proper fitting shoes.  Pulmonary fibrosis [facility]): He is followed by Dr. Chase Caller.  Other medical conditions are listed as follows:  Coronary artery calcification  Former smoker  Myalgia due to statin  Hypercholesterolemia  Orders: No orders of the defined types were placed in this encounter.  Meds ordered this encounter  Medications  . azaTHIOprine (IMURAN) 50 MG tablet    Sig: Take 2 tablets (100 mg total) by mouth daily.    Dispense:  180 tablet    Refill:  0     Follow-Up Instructions: Return in about 5 months (around 05/24/2020) for Rheumatoid arthritis, Osteoarthritis.   Ofilia Neas, PA-C  Note - This record has been created using Dragon software.  Chart creation errors have been sought, but may not always  have been located. Such creation errors do not reflect on  the standard of medical care.

## 2019-12-21 NOTE — Telephone Encounter (Signed)
Called patient to confirm he increased dose to 2 tablets daily and patient confirmed. Patient complains of increased pain. I advised patient we like to give medications 3 months to see full potential and also advised patient that Dr. Estanislado Pandy does not prescribe pain medication. Patient has been scheduled for 12/23/2019 for further evaluation and discussion.

## 2019-12-22 ENCOUNTER — Other Ambulatory Visit: Payer: Self-pay

## 2019-12-22 ENCOUNTER — Encounter: Payer: PPO | Attending: Internal Medicine | Admitting: *Deleted

## 2019-12-22 DIAGNOSIS — M1990 Unspecified osteoarthritis, unspecified site: Secondary | ICD-10-CM | POA: Diagnosis not present

## 2019-12-22 DIAGNOSIS — E785 Hyperlipidemia, unspecified: Secondary | ICD-10-CM | POA: Insufficient documentation

## 2019-12-22 DIAGNOSIS — Z7952 Long term (current) use of systemic steroids: Secondary | ICD-10-CM | POA: Diagnosis not present

## 2019-12-22 DIAGNOSIS — Z87891 Personal history of nicotine dependence: Secondary | ICD-10-CM | POA: Diagnosis not present

## 2019-12-22 DIAGNOSIS — Z79899 Other long term (current) drug therapy: Secondary | ICD-10-CM | POA: Insufficient documentation

## 2019-12-22 DIAGNOSIS — J849 Interstitial pulmonary disease, unspecified: Secondary | ICD-10-CM | POA: Insufficient documentation

## 2019-12-22 DIAGNOSIS — I2510 Atherosclerotic heart disease of native coronary artery without angina pectoris: Secondary | ICD-10-CM | POA: Diagnosis not present

## 2019-12-22 DIAGNOSIS — J439 Emphysema, unspecified: Secondary | ICD-10-CM | POA: Insufficient documentation

## 2019-12-22 NOTE — Progress Notes (Signed)
Daily Session Note  Patient Details  Name: Lawrence Wells MRN: [id number] Date of Birth: 1956-03-05 Referring Provider:     Pulmonary Rehab from 10/28/2019 in [facility] Re[facility]  Referring Provider  Brand Males MD      Encounter Date: 12/22/2019  Check In: Session Check In - 12/22/19 0819      Check-In   Location  [facility]-Cardiac & Pulmonary Rehab    Staff Present  Alberteen Sam, MA, RCEP, CCRP, CCET;Other;Amanda Sommer, BA, ACSM CEP, Exercise Physiologist;Joseph Hood RCP,RRT,BSRT;Melissa Dwight [place] RDN, LDN   Basilia Jumbo RN, BSN   Virtual Visit  No    Medication changes reported      No    Fall or balance concerns reported     No    Warm-up and Cool-down  Performed on first and last piece of equipment    Resistance Training Performed  Yes    VAD Patient?  No    PAD/SET Patient?  No      Pain Assessment   Currently in Pain?  No/denies          Social History   Tobacco Use  Smoking Status Former Smoker  . Packs/day: 1.50  . Years: 30.00  . Pack years: 45.00  . Types: Cigarettes  . Quit date: 2007  . Years since quitting: 14.3  Smokeless Tobacco Former Systems developer  . Types: Chew  . Quit date: 2007  Tobacco Comment   Dip smokeless tobacco >20-30 years    Goals Met:  Independence with exercise equipment Exercise tolerated well No report of cardiac concerns or symptoms  Goals Unmet:  Not Applicable  Comments: Pt able to follow exercise prescription today without complaint.  Will continue to monitor for progression.    Dr. Emily Filbert is Medical Director for [place] and [facility].

## 2019-12-23 ENCOUNTER — Encounter: Payer: Self-pay | Admitting: Physician Assistant

## 2019-12-23 ENCOUNTER — Ambulatory Visit (INDEPENDENT_AMBULATORY_CARE_PROVIDER_SITE_OTHER): Payer: PPO | Admitting: Physician Assistant

## 2019-12-23 ENCOUNTER — Other Ambulatory Visit: Payer: Self-pay

## 2019-12-23 VITALS — BP 126/79 | HR 67 | Resp 17 | Ht 71.0 in | Wt 164.2 lb

## 2019-12-23 DIAGNOSIS — M1711 Unilateral primary osteoarthritis, right knee: Secondary | ICD-10-CM

## 2019-12-23 DIAGNOSIS — M19011 Primary osteoarthritis, right shoulder: Secondary | ICD-10-CM | POA: Diagnosis not present

## 2019-12-23 DIAGNOSIS — E7800 Pure hypercholesterolemia, unspecified: Secondary | ICD-10-CM | POA: Diagnosis not present

## 2019-12-23 DIAGNOSIS — J8410 Pulmonary fibrosis, unspecified: Secondary | ICD-10-CM

## 2019-12-23 DIAGNOSIS — J849 Interstitial pulmonary disease, unspecified: Secondary | ICD-10-CM

## 2019-12-23 DIAGNOSIS — M19071 Primary osteoarthritis, right ankle and foot: Secondary | ICD-10-CM

## 2019-12-23 DIAGNOSIS — M19072 Primary osteoarthritis, left ankle and foot: Secondary | ICD-10-CM

## 2019-12-23 DIAGNOSIS — M0579 Rheumatoid arthritis with rheumatoid factor of multiple sites without organ or systems involvement: Secondary | ICD-10-CM | POA: Diagnosis not present

## 2019-12-23 DIAGNOSIS — Z79899 Other long term (current) drug therapy: Secondary | ICD-10-CM

## 2019-12-23 DIAGNOSIS — R683 Clubbing of fingers: Secondary | ICD-10-CM | POA: Diagnosis not present

## 2019-12-23 DIAGNOSIS — M791 Myalgia, unspecified site: Secondary | ICD-10-CM

## 2019-12-23 DIAGNOSIS — I2510 Atherosclerotic heart disease of native coronary artery without angina pectoris: Secondary | ICD-10-CM

## 2019-12-23 DIAGNOSIS — T466X5A Adverse effect of antihyperlipidemic and antiarteriosclerotic drugs, initial encounter: Secondary | ICD-10-CM

## 2019-12-23 DIAGNOSIS — Z87891 Personal history of nicotine dependence: Secondary | ICD-10-CM | POA: Diagnosis not present

## 2019-12-23 DIAGNOSIS — M19012 Primary osteoarthritis, left shoulder: Secondary | ICD-10-CM

## 2019-12-23 DIAGNOSIS — I2584 Coronary atherosclerosis due to calcified coronary lesion: Secondary | ICD-10-CM

## 2019-12-23 MED ORDER — AZATHIOPRINE 50 MG PO TABS: 100.0000 mg | ORAL_TABLET | Freq: Every day | ORAL | 0 refills | Status: AC

## 2019-12-23 NOTE — Patient Instructions (Signed)
Standing Labs We placed an order today for your standing lab work.    Please come back and get your standing labs in August and every 3 months   We have open lab daily Monday through Thursday from 8:30-12:30 PM and 1:30-4:30 PM and Friday from 8:30-12:30 PM and 1:30-4:00 PM at the office of Dr. Shaili Deveshwar.   You may experience shorter wait times on Monday and Friday afternoons. The office is located at [address], [address], [place], [place] [address] No appointment is necessary.   Labs are drawn by [facility].  You may receive a bill from [facility] for your lab work.  If you wish to have your labs drawn at another location, please call the office 24 hours in advance to send orders.  If you have any questions regarding directions or hours of operation,  please call [phone].   Just as a reminder please drink plenty of water prior to coming for your lab work. Thanks!   

## 2019-12-28 ENCOUNTER — Telehealth: Payer: Self-pay

## 2019-12-28 DIAGNOSIS — J849 Interstitial pulmonary disease, unspecified: Secondary | ICD-10-CM | POA: Insufficient documentation

## 2019-12-28 DIAGNOSIS — M0579 Rheumatoid arthritis with rheumatoid factor of multiple sites without organ or systems involvement: Secondary | ICD-10-CM | POA: Insufficient documentation

## 2019-12-28 NOTE — Telephone Encounter (Signed)
Copied from [place] [id number]. Topic: General - Other >> Dec 28, 2019  9:15 AM Leward Quan A wrote: Reason for CRM: Patient wife [name] called to request a new referral for a Rheumatologist. Requesting to get a referral to see Dr. Meda Coffee at [facility] if possible. Please call Jody with questions or concerns at Ph#  [phone]

## 2019-12-28 NOTE — Telephone Encounter (Signed)
Called patient back.  Placed referral.  Reason for Referral: Patient preference to switch Rheumatologist providers. Previously managed by [facility] [place] - Dr Bronson Curb / Hazel Sams PA, for rheumatoid arthritis sero positive multiple joint pain, on azathioprine, joint injections and other management without improvement in joint pain. Followed by Pulm for ILD related to this problem.   Has the referral been discussed with the patient?:   Designated contact for the referral if not the patient (name/phone number): patient [phone] (M) (also shared by wife, Lawrence Wells)   Has the patient seen a specialist for this issue before?: Yes  If so, who (practice/provider)? Dr Bronson Curb / Hazel Sams PA - [facility] [place]   Does the patient have a provider or location preference for the referral?: Yes - Dr Annalee Genta [facility])  Would the patient like to see previous specialist if applicable? No   Patient preference to change due to requesting 2nd opinion, not improving on current treatment plan, and has family member who sees Dr Meda Coffee.  Lawrence Wells, [facility] 12/28/2019, 4:48 PM

## 2019-12-29 ENCOUNTER — Encounter: Payer: PPO | Admitting: *Deleted

## 2019-12-29 ENCOUNTER — Other Ambulatory Visit: Payer: Self-pay

## 2019-12-29 DIAGNOSIS — J849 Interstitial pulmonary disease, unspecified: Secondary | ICD-10-CM

## 2019-12-29 NOTE — Progress Notes (Signed)
Daily Session Note  Patient Details  Name: Lawrence Wells MRN: [id number] Date of Birth: [birth year] Referring Provider:     Pulmonary Rehab from 10/28/2019 in [facility] Cardiac and Pulmonary Rehab  Referring Provider  Brand Males MD      Encounter Date: 12/29/2019  Check In: Session Check In - 12/29/19 0821      Check-In   Supervising physician immediately available to respond to emergencies  See telemetry face sheet for immediately available ER MD    Location  [facility]-Cardiac & Pulmonary Rehab    Staff Present  Heath Lark, RN, BSN, CCRP;Jessica [place], MA, RCEP, CCRP, [place], [place], ACSM CEP, Exercise Physiologist;Joseph Merigold [place]    Virtual Visit  No    Medication changes reported      No    Fall or balance concerns reported     No    Warm-up and Cool-down  Performed on first and last piece of equipment    Resistance Training Performed  Yes    VAD Patient?  No    PAD/SET Patient?  No      Pain Assessment   Currently in Pain?  No/denies          Social History   Tobacco Use  Smoking Status Former Smoker  . Packs/day: 1.50  . Years: 30.00  . Pack years: 45.00  . Types: Cigarettes  . Quit date: 2007  . Years since quitting: 14.3  Smokeless Tobacco Former Systems developer  . Types: Chew  . Quit date: 2007  Tobacco Comment   Dip smokeless tobacco >20-30 years    Goals Met:  Proper associated with RPD/PD & O2 Sat Independence with exercise equipment Exercise tolerated well No report of cardiac concerns or symptoms  Goals Unmet:  Not Applicable  Comments: Pt able to follow exercise prescription today without complaint.  Will continue to monitor for progression.    Dr. Emily Filbert is Medical Director for [place] and [facility].

## 2019-12-30 ENCOUNTER — Other Ambulatory Visit: Payer: Self-pay

## 2019-12-30 ENCOUNTER — Encounter: Payer: PPO | Admitting: *Deleted

## 2019-12-30 DIAGNOSIS — J849 Interstitial pulmonary disease, unspecified: Secondary | ICD-10-CM | POA: Diagnosis not present

## 2019-12-30 NOTE — Progress Notes (Signed)
Daily Session Note  Patient Details  Name: Lawrence Wells MRN: [id number] Date of Birth: [birth year] Referring Provider:     Pulmonary Rehab from 10/28/2019 in [facility] Cardiac and Pulmonary Rehab  Referring Provider  Brand Males MD      Encounter Date: 12/30/2019  Check In: Session Check In - 12/30/19 0829      Check-In   Supervising physician immediately available to respond to emergencies  See telemetry face sheet for immediately available ER MD    Location  [facility]Cardiac & Pulmonary Rehab    Staff Present  Heath Lark, RN, BSN, CCRP;Melissa [place] RDN, Rowe Pavy, BA, ACSM CEP, Exercise Physiologist    Virtual Visit  No    Medication changes reported      No    Fall or balance concerns reported     No    Warm-up and Cool-down  Performed on first and last piece of equipment    Resistance Training Performed  Yes    VAD Patient?  No    PAD/SET Patient?  No      Pain Assessment   Currently in Pain?  No/denies          Social History   Tobacco Use  Smoking Status Former Smoker  . Packs/day: 1.50  . Years: 30.00  . Pack years: 45.00  . Types: Cigarettes  . Quit date: 2007  . Years since quitting: 14.3  Smokeless Tobacco Former Systems developer  . Types: Chew  . Quit date: 2007  Tobacco Comment   Dip smokeless tobacco >20-30 years    Goals Met:  Proper associated with RPD/PD & O2 Sat Independence with exercise equipment Exercise tolerated well No report of cardiac concerns or symptoms  Goals Unmet:  Not Applicable  Comments: Pt able to follow exercise prescription today without complaint.  Will continue to monitor for progression.    Dr. Emily Filbert is Medical Director for [place] and [facility].

## 2020-01-05 ENCOUNTER — Encounter: Payer: Self-pay | Admitting: *Deleted

## 2020-01-05 ENCOUNTER — Other Ambulatory Visit: Payer: Self-pay

## 2020-01-05 ENCOUNTER — Encounter: Payer: PPO | Admitting: *Deleted

## 2020-01-05 DIAGNOSIS — J849 Interstitial pulmonary disease, unspecified: Secondary | ICD-10-CM

## 2020-01-05 NOTE — Progress Notes (Signed)
Daily Session Note  Patient Details  Name: Lawrence Wells MRN: [id number] Date of Birth: [birth year] Referring Provider:     Pulmonary Rehab from 10/28/2019 in [facility] Cardiac and Pulmonary Rehab  Referring Provider  Brand Males MD      Encounter Date: 01/05/2020  Check In: Session Check In - 01/05/20 0830      Check-In   Supervising physician immediately available to respond to emergencies  See telemetry face sheet for immediately available ER MD    Location  [facility]Cardiac & Pulmonary Rehab    Staff Present  Heath Lark, RN, BSN, CCRP;Joseph Hood RCP,RRT,BSRT;Amanda Oletta D[place], [place], ACSM CEP, Exercise Physiologist    Virtual Visit  No    Medication changes reported      No    Fall or balance concerns reported     No    Warm-up and Cool-down  Performed on first and last piece of equipment    Resistance Training Performed  Yes    VAD Patient?  No    PAD/SET Patient?  No      Pain Assessment   Currently in Pain?  No/denies          Social History   Tobacco Use  Smoking Status Former Smoker  . Packs/day: 1.50  . Years: 30.00  . Pack years: 45.00  . Types: Cigarettes  . Quit date: 2007  . Years since quitting: 14.3  Smokeless Tobacco Former Systems developer  . Types: Chew  . Quit date: 2007  Tobacco Comment   Dip smokeless tobacco >20-30 years    Goals Met:  Proper associated with RPD/PD & O2 Sat Independence with exercise equipment Exercise tolerated well No report of cardiac concerns or symptoms  Goals Unmet:  Not Applicable  Comments: Pt able to follow exercise prescription today without complaint.  Will continue to monitor for progression.    Dr. Emily Filbert is Medical Director for [place] and [facility].

## 2020-01-05 NOTE — Progress Notes (Signed)
Pulmonary Individual Treatment Plan  Patient Details  Name: Lawrence Wells MRN: [id number] Date of Birth: Nov 29, 1955 Referring Provider:     Pulmonary Rehab from 10/28/2019 in [facility]  Referring Provider  Brand Males MD      Initial Encounter Date:    Pulmonary Rehab from 10/28/2019 in [facility]  Date  10/28/19      Visit Diagnosis: ILD (interstitial lung disease) ([place])  Patient's Home Medications on Admission:  Current Outpatient Medications:  .  azaTHIOprine (IMURAN) 50 MG tablet, Take 2 tablets (100 mg total) by mouth daily., Disp: 180 tablet, Rfl: 0 .  diclofenac Sodium (VOLTAREN) 1 % GEL, Apply 2 g topically 3 (three) times daily as needed., Disp: 100 g, Rfl: 2 .  ezetimibe (ZETIA) 10 MG tablet, Take 1 tablet (10 mg total) by mouth daily., Disp: 90 tablet, Rfl: 3 .  gabapentin (NEURONTIN) 100 MG capsule, Start 1 capsule daily, increase by 1 cap every 2-3 days as tolerated up to 3 times a day, or may take 3 at once in evening., Disp: 90 capsule, Rfl: 3 .  Multiple Vitamin (MULTIVITAMIN) tablet, Take 1 tablet by mouth daily., Disp: , Rfl:  .  naproxen (NAPROSYN) 500 MG tablet, Take 1 tablet (500 mg total) by mouth 2 (two) times daily with a meal. For 2-4 weeks then as needed, Disp: 60 tablet, Rfl: 3 .  OFEV 150 MG CAPS, TAKE 1 CAPSULE BY MOUTH TWICE DAILY WITH FOOD. CALL [phone] FOR REFILLS, Disp: 60 capsule, Rfl: 11 .  OXYGEN, Inhale into the lungs as needed., Disp: , Rfl:  .  Tiotropium Bromide Monohydrate (SPIRIVA RESPIMAT) 2.5 MCG/ACT AERS, Inhale 2 puffs into the lungs daily., Disp: 1 g, Rfl: 5  Past Medical History: Past Medical History:  Diagnosis Date  . Arthritis   . COPD (chronic obstructive pulmonary disease) ([place])   . Coronary artery disease   . Dyspnea   . Emphysema lung ([place])   . Hyperlipidemia   . Pulmonary filariasis     Tobacco Use: Social History   Tobacco Use  Smoking Status Former Smoker  .  Packs/day: 1.50  . Years: 30.00  . Pack years: 45.00  . Types: Cigarettes  . Quit date: 2007  . Years since quitting: 14.3  Smokeless Tobacco Former Systems developer  . Types: Chew  . Quit date: 2007  Tobacco Comment   Dip smokeless tobacco >20-30 years    Labs: Recent Review Flowsheet Data    Labs for ITP Cardiac and Pulmonary Rehab Latest Ref Rng & Units 05/07/2017 09/02/2019   Cholestrol <200 mg/dL 259(H) 210(H)   LDLCALC mg/dL (calc) 156(H) 134(H)   HDL > OR = 40 mg/dL 76 51   Trlycerides <150 mg/dL 138 139   Hemoglobin A1c <5.7 % of total Hgb 5.5 5.7(H)       Pulmonary Assessment Scores: Pulmonary Assessment Scores    Row Name 10/27/19 1117         ADL UCSD   ADL Phase  Entry     SOB Score total  41     Rest  0     Walk  3     Stairs  4     Bath  2     Dress  2     Shop  1       CAT Score   CAT Score  15        UCSD: Self-administered rating of dyspnea associated with activities  of daily living (ADLs) 6-point scale (0 = "not at all" to 5 = "maximal or unable to do because of breathlessness")  Scoring Scores range from 0 to 120.  Minimally important difference is 5 units  CAT: CAT can identify the health impairment of COPD patients and is better correlated with disease progression.  CAT has a scoring range of zero to 40. The CAT score is classified into four groups of low (less than 10), medium (10 - 20), high (21-30) and very high (31-40) based on the impact level of disease on health status. A CAT score over 10 suggests significant symptoms.  A worsening CAT score could be explained by an exacerbation, poor medication adherence, poor inhaler technique, or progression of COPD or comorbid conditions.  CAT MCID is 2 points  mMRC: mMRC (Modified Medical Research Council) Dyspnea Scale is used to assess the degree of baseline functional disability in patients of respiratory disease due to dyspnea. No minimal important difference is established. A decrease in score of 1  point or greater is considered a positive change.   Pulmonary Function Assessment: Pulmonary Function Assessment - 10/27/19 1118      Breath   Shortness of Breath  Yes;Limiting activity       Exercise Target Goals: Exercise Program Goal: Individual exercise prescription set using results from initial 6 min walk test and THRR while considering  patient's activity barriers and safety.   Exercise Prescription Goal: Initial exercise prescription builds to 30-45 minutes a day of aerobic activity, 2-3 days per week.  Home exercise guidelines will be given to patient during program as part of exercise prescription that the participant will acknowledge.  Education: Aerobic Exercise & Resistance Training: - Gives group verbal and written instruction on the various components of exercise. Focuses on aerobic and resistive training programs and the benefits of this training and how to safely progress through these programs..   Education: Exercise & Equipment Safety: - Individual verbal instruction and demonstration of equipment use and safety with use of the equipment.   Pulmonary Rehab from 10/28/2019 in [facility] Re[facility]  Date  10/28/19  Educator  [facility]  Instruction Review Code  1- Verbalizes Understanding      Education: Exercise Physiology & General Exercise Guidelines: - Group verbal and written instruction with models to review the exercise physiology of the cardiovascular system and associated critical values. Provides general exercise guidelines with specific guidelines to those with heart or lung disease.    Education: Flexibility, Balance, Mind/Body Relaxation: Provides group verbal/written instruction on the benefits of flexibility and balance training, including mind/body exercise modes such as yoga, pilates and tai chi.  Demonstration and skill practice provided.   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 HR  95 bpm     Max Ex. BP  146/72     2 Minute Post BP  134/70       Interval HR   1 Minute HR  82     2 Minute HR  89     3 Minute HR  89     4 Minute HR  95     5 Minute HR  92     6 Minute HR  93     2 Minute Post HR  67     Interval Heart Rate?  Yes       Interval Oxygen   Interval Oxygen?  Yes     Baseline Oxygen Saturation %  95 %     1 Minute Oxygen Saturation %  89 %     1 Minute Liters of Oxygen  0 L Room Air     2 Minute Oxygen Saturation %  86 %     2 Minute Liters of Oxygen  0 L     3 Minute Oxygen Saturation %  82 %     3 Minute Liters of Oxygen  0 L     4 Minute Oxygen Saturation %  81 %     4 Minute Liters of Oxygen  0 L     5 Minute Oxygen Saturation %  80 %     5 Minute Liters of Oxygen  0 L     6 Minute Oxygen Saturation %  79 %     6 Minute Liters of Oxygen  0 L     2 Minute Post Oxygen Saturation %  91 %     2 Minute Post Liters of Oxygen  0 L       Oxygen Initial Assessment: Oxygen Initial Assessment - 11/03/19 0835      Home Oxygen   Home Oxygen Device  Home Concentrator;E-Tanks    Sleep Oxygen Prescription  Continuous    Liters per minute  2    Home Exercise Oxygen Prescription  Continuous    Liters per minute  2    Home at Rest Exercise  Oxygen Prescription  None    Liters per minute  0    Compliance with Home Oxygen Use  Yes       Oxygen Re-Evaluation: Oxygen Re-Evaluation    Row Name 11/03/19 0843 11/25/19 0742 12/29/19 0758         Program Oxygen Prescription   Program Oxygen Prescription  E-Tanks;Continuous  E-Tanks;Continuous  E-Tanks;Continuous     Liters per minute  '2  3  3     ' Comments  Required oxygen 2 L  while exercising  Required oxygen 3 L  while exercising  Required oxygen 3 L  while exercising       Home Oxygen   Home Oxygen Device  Home Concentrator;E-Tanks  Home Concentrator;E-Tanks  Home Concentrator;E-Tanks     Sleep Oxygen Prescription  Continuous  Continuous  Continuous     Liters per minute  2  2 may use 3L when worked hard during the  day (a couple times a week)  2     Home Exercise Oxygen Prescription  Continuous  Continuous  Continuous     Liters per minute  '2  3  3     ' Home at Rest Exercise Oxygen Prescription  None  --  None     Liters per minute  0  0  0     Compliance with Home Oxygen Use  Yes  Yes  Yes       Goals/Expected Outcomes   Short Term Goals  To learn and exhibit compliance with exercise, home and travel O2 prescription;To learn and understand importance of monitoring SPO2 with pulse oximeter and demonstrate accurate use of the pulse oximeter.;To learn and understand importance of maintaining oxygen saturations>88%;To learn and demonstrate proper pursed lip breathing techniques or other breathing techniques.  To learn and exhibit compliance with exercise, home and travel O2 prescription;To learn and understand importance of monitoring SPO2 with pulse oximeter and demonstrate accurate use of the pulse oximeter.;To learn and understand importance of maintaining oxygen saturations>88%;To learn and demonstrate proper pursed lip breathing techniques or other breathing techniques.  To learn and exhibit compliance with exercise, home and travel O2 prescription;To learn and understand  importance of monitoring SPO2 with pulse oximeter and demonstrate accurate use of the pulse oximeter.;To learn and understand importance of maintaining oxygen saturations>88%;To learn and demonstrate proper pursed lip breathing techniques or other breathing techniques.     Long  Term Goals  Exhibits compliance with exercise, home and travel O2 prescription;Verbalizes importance of monitoring SPO2 with pulse oximeter and return demonstration;Maintenance of O2 saturations>88%;Exhibits proper breathing techniques, such as pursed lip breathing or other method taught during program session  Exhibits compliance with exercise, home and travel O2 prescription;Verbalizes importance of monitoring SPO2 with pulse oximeter and return demonstration;Maintenance of O2 saturations>88%;Exhibits proper breathing techniques, such as pursed lip breathing or other method taught during program session  Exhibits compliance with exercise, home and travel O2 prescription;Verbalizes importance of monitoring SPO2 with pulse oximeter and return demonstration;Maintenance of O2 saturations>88%;Exhibits proper breathing techniques, such as pursed lip breathing or other method taught during program session     Comments  Reviewed PLB technique with pt.  Talked about how it works and it's importance in maintaining their exercise saturations.  Reviewed PLB technique with pt.  Talked about how it works and it's importance in maintaining their exercise saturations.  Saeed checks 02 at home.  He will be evaluated at [facility] for transplant later this month     Goals/Expected Outcomes  Short: Become more profiecient at using PLB.   Long: Become independent at using PLB.  Short: Become more profiecient at using PLB.   Long: Become independent at using PLB.  Short:  continue to use PLB and monitor 02 at home Long:  maintian 02 at optimal levels        Oxygen Discharge (Final Oxygen Re-Evaluation): Oxygen Re-Evaluation - 12/29/19 0758      Program  Oxygen Prescription   Program Oxygen Prescription  E-Tanks;Continuous    Liters per minute  3    Comments  Required oxygen 3 L  while exercising      Home Oxygen   Home Oxygen Device  Home Concentrator;E-Tanks    Sleep Oxygen Prescription  Continuous    Liters per minute  2    Home Exercise Oxygen Prescription  Continuous    Liters per minute  3    Home at Rest Exercise Oxygen Prescription  None  Liters per minute  0    Compliance with Home Oxygen Use  Yes      Goals/Expected Outcomes   Short Term Goals  To learn and exhibit compliance with exercise, home and travel O2 prescription;To learn and understand importance of monitoring SPO2 with pulse oximeter and demonstrate accurate use of the pulse oximeter.;To learn and understand importance of maintaining oxygen saturations>88%;To learn and demonstrate proper pursed lip breathing techniques or other breathing techniques.    Long  Term Goals  Exhibits compliance with exercise, home and travel O2 prescription;Verbalizes importance of monitoring SPO2 with pulse oximeter and return demonstration;Maintenance of O2 saturations>88%;Exhibits proper breathing techniques, such as pursed lip breathing or other method taught during program session    Comments  Angelgabriel checks 02 at home.  He will be evaluated at [facility] for [facility] later this month    Goals/Expected Outcomes  Short:  continue to use PLB and monitor 02 at home Long:  maintian 02 at optimal levels       Initial Exercise Prescription: Initial Exercise Prescription - 10/28/19 1100      Date of Initial Exercise RX and Referring Provider   Date  10/28/19    Referring Provider  Brand Males MD      Oxygen   Oxygen  Continuous    Liters  2      Treadmill   MPH  2.4    Grade  0.5    Minutes  15    METs  3      NuStep   Level  3    SPM  80    Minutes  15    METs  3      Elliptical   Level  1    Speed  3    Minutes  15    METs  3      Biostep-RELP   Level  3    SPM   50    Minutes  15    METs  3      Prescription Details   Frequency (times per week)  2    Duration  Progress to 30 minutes of continuous aerobic without signs/symptoms of physical distress      Intensity   THRR 40-80% of Max Heartrate  98-138    Ratings of Perceived Exertion  11-13    Perceived Dyspnea  0-4      Progression   Progression  Continue to progress workloads to maintain intensity without signs/symptoms of physical distress.      Resistance Training   Training Prescription  Yes    Weight  5 lb    Reps  10-15       Perform Capillary Blood Glucose checks as needed.  Exercise Prescription Changes: Exercise Prescription Changes    Row Name 10/28/19 1100 11/10/19 0900 11/23/19 1500 12/08/19 1100 12/09/19 0800     Response to Exercise   Blood Pressure (Admit)  130/74  110/68  104/60  128/70  --   Blood Pressure (Exercise)  146/72  124/78  134/74  152/74  --   Blood Pressure (Exit)  132/70  104/60  106/60  126/74  --   Heart Rate (Admit)  60 bpm  69 bpm  70 bpm  69 bpm  --   Heart Rate (Exercise)  95 bpm  90 bpm  97 bpm  98 bpm  --   Heart Rate (Exit)  62 bpm  74 bpm  74 bpm  85 bpm  --  Oxygen Saturation (Admit)  95 %  91 %  98 %  97 %  --   Oxygen Saturation (Exercise)  79 %  92 %  82 %  89 %  --   Oxygen Saturation (Exit)  95 %  94 %  98 %  97 %  --   Rating of Perceived Exertion (Exercise)  '7  11  13  13  ' --   Perceived Dyspnea (Exercise)  '1  3  3  2  ' --   Symptoms  none  SOB  SOB  SOB  --   Comments  walk test results  --  --  --  --   Duration  --  Continue with 30 min of aerobic exercise without signs/symptoms of physical distress.  Continue with 30 min of aerobic exercise without signs/symptoms of physical distress.  Continue with 30 min of aerobic exercise without signs/symptoms of physical distress.  --   Intensity  --  THRR unchanged  THRR unchanged  THRR unchanged  --     Progression   Progression  --  Continue to progress workloads to maintain  intensity without signs/symptoms of physical distress.  Continue to progress workloads to maintain intensity without signs/symptoms of physical distress.  Continue to progress workloads to maintain intensity without signs/symptoms of physical distress.  --   Average METs  --  3  4 4L on TM   3.68  --     Resistance Training   Training Prescription  --  Yes  Yes  Yes  --   Weight  --  5 lb  5 lb  5 lb  --   Reps  --  10-15  10-15  10-15  --     Interval Training   Interval Training  --  No  No  No  --     Oxygen   Oxygen  --  Continuous  Continuous  Continuous  --   Liters  --  2-3  3-4  3-4  --     Treadmill   MPH  --  2.4  2.4  2.8  --   Grade  --  0.'5  1  1  ' --   Minutes  --  '15  15  15  ' --   METs  --  3  3.17  3.53  --     NuStep   Level  --  --  --  4  --   Minutes  --  --  --  15  --   METs  --  --  --  3.5  --     REL-XR   Level  --  '3  4  6  ' --   Minutes  --  '15  15  15  ' --     Biostep-RELP   Level  --  --  --  3  --   Minutes  --  --  --  15  --   METs  --  --  --  4  --     Home Exercise Plan   Plans to continue exercise at  --  --  --  --  [employer] (comment) Neurosurgeon and walking   Frequency  --  --  --  --  Add 2 additional days to program exercise sessions.   Initial Home Exercises Provided  --  --  --  --  12/09/19   Row Name 12/20/19 1400  Response to Exercise   Blood Pressure (Admit)  94/60       Blood Pressure (Exercise)  128/72       Blood Pressure (Exit)  92/62       Heart Rate (Admit)  71 bpm       Heart Rate (Exercise)  98 bpm       Heart Rate (Exit)  74 bpm       Oxygen Saturation (Admit)  97 %       Oxygen Saturation (Exercise)  83 %       Oxygen Saturation (Exit)  95 %       Rating of Perceived Exertion (Exercise)  12       Duration  Continue with 30 min of aerobic exercise without signs/symptoms of physical distress.       Intensity  THRR unchanged         Progression   Progression  Continue to progress  workloads to maintain intensity without signs/symptoms of physical distress.       Average METs  3.25         Resistance Training   Training Prescription  Yes       Weight  5 lb       Reps  10-15         Interval Training   Interval Training  No         Oxygen   Oxygen  Continuous       Liters  3-4         Treadmill   MPH  2.8       Grade  1       Minutes  15       METs  3.53         REL-XR   Level  6       Watts  50       Minutes  15          Exercise Comments: Exercise Comments    Row Name 11/03/19 0829 11/03/19 0830 12/01/19 0843 12/08/19 0750 12/16/19 5176   Exercise Comments  First full day of exercise!  Patient was oriented to gym and equipment including functions, settings, policies, and procedures.  Patient's individual exercise prescription and treatment plan were reviewed.  All starting workloads were established based on the results of the 6 minute walk test done at initial orientation visit.  The plan for exercise progression was also introduced and progression will be customized based on patient's performance and goals.  Dropped to 80% on TM  Placed  2l per nasal canuula oxygen on pJohn   Sats back up above 90%  Today while on the TM, sats dropped to 85%. Cued PLB and decreased Workload to get sat to 88%  O2 Sat  Dropping to 85% today   Oxygen increased to 4 L  Adjusting oyygen while on TM for sat lower than 88%. Change did bring sats up to 93%      Exercise Goals and Review: Exercise Goals    Row Name 10/28/19 1144             Exercise Goals   Increase Physical Activity  Yes       Intervention  Provide advice, education, support and counseling about physical activity/exercise needs.;Develop an individualized exercise prescription for aerobic and resistive training based on initial evaluation findings, risk stratification, comorbidities and participant's personal goals.       Expected Outcomes  Short Term: Attend rehab  on a regular basis to increase amount  of physical activity.;Long Term: Add in home exercise to make exercise part of routine and to increase amount of physical activity.;Long Term: Exercising regularly at least 3-5 days a week.       Increase Strength and Stamina  Yes       Intervention  Provide advice, education, support and counseling about physical activity/exercise needs.;Develop an individualized exercise prescription for aerobic and resistive training based on initial evaluation findings, risk stratification, comorbidities and participant's personal goals.       Expected Outcomes  Short Term: Increase workloads from initial exercise prescription for resistance, speed, and METs.;Short Term: Perform resistance training exercises routinely during rehab and add in resistance training at home;Long Term: Improve cardiorespiratory fitness, muscular endurance and strength as measured by increased METs and functional capacity (6MWT)       Able to understand and use rate of perceived exertion (RPE) scale  Yes       Intervention  Provide education and explanation on how to use RPE scale       Expected Outcomes  Short Term: Able to use RPE daily in rehab to express subjective intensity level;Long Term:  Able to use RPE to guide intensity level when exercising independently       Able to understand and use Dyspnea scale  Yes       Intervention  Provide education and explanation on how to use Dyspnea scale       Expected Outcomes  Short Term: Able to use Dyspnea scale daily in rehab to express subjective sense of shortness of breath during exertion;Long Term: Able to use Dyspnea scale to guide intensity level when exercising independently       Knowledge and understanding of Target Heart Rate Range (THRR)  Yes       Intervention  Provide education and explanation of THRR including how the numbers were predicted and where they are located for reference       Expected Outcomes  Short Term: Able to state/look up THRR;Short Term: Able to use daily as  guideline for intensity in rehab;Long Term: Able to use THRR to govern intensity when exercising independently       Able to check pulse independently  Yes       Intervention  Provide education and demonstration on how to check pulse in carotid and radial arteries.;Review the importance of being able to check your own pulse for safety during independent exercise       Expected Outcomes  Short Term: Able to explain why pulse checking is important during independent exercise;Long Term: Able to check pulse independently and accurately       Understanding of Exercise Prescription  Yes       Intervention  Provide education, explanation, and written materials on patient's individual exercise prescription       Expected Outcomes  Long Term: Able to explain home exercise prescription to exercise independently;Short Term: Able to explain program exercise prescription          Exercise Goals Re-Evaluation : Exercise Goals Re-Evaluation    Row Name 11/03/19 0834 11/10/19 0909 11/23/19 1535 11/25/19 0748 12/08/19 1006     Exercise Goal Re-Evaluation   Exercise Goals Review  Knowledge and understanding of Target Heart Rate Range (THRR);Able to understand and use Dyspnea scale;Able to understand and use rate of perceived exertion (RPE) scale;Understanding of Exercise Prescription  Increase Physical Activity;Increase Strength and Stamina;Understanding of Exercise Prescription  Increase Physical Activity;Increase Strength and  Stamina;Able to understand and use rate of perceived exertion (RPE) scale;Able to understand and use Dyspnea scale;Knowledge and understanding of Target Heart Rate Range (THRR);Able to check pulse independently;Understanding of Exercise Prescription  Increase Physical Activity;Increase Strength and Stamina;Able to understand and use rate of perceived exertion (RPE) scale;Able to understand and use Dyspnea scale;Knowledge and understanding of Target Heart Rate Range (THRR);Able to check pulse  independently;Understanding of Exercise Prescription  Increase Physical Activity;Increase Strength and Stamina;Understanding of Exercise Prescription   Comments  Reviewed RPE scale, THR and program prescription with pt today.  Pt voiced understanding and was given a copy of goals to take home.  Arash is off to a good start in rehab.  He is exercising on 2-3L to maintain his oxygen saturations. He has completed his first three full days of exercise.  We will continue to monitor his progress.  Konnor had to use 4L on TM due to low O2.  He did increase incline to 1%.  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 Pt will rest when he gets SOB at work because does not wear oxygen.  Adebayo has been doing well in rehab.  He is now up to level 6 on the XR and his sats are doing well too.  We will continue to monitor his progression.   Expected Outcomes  Short: Use RPE daily to regulate intensity. Long: Follow program prescription in THR.  Short: Continue to attend rehab regularly  Long: Continue to improve stamina and maintain saturations.  Short:  exercise consistently and keep O2 above 88%  Long: improve MET level  Short:  exercise consistently and keep O2 above 88%  Long: improve MET level  Short: Continue to monitor O2 closely  Long: Continue to improve stamina.   [place] Name 12/09/19 5697 12/20/19 1405           Exercise Goal Re-Evaluation   Exercise Goals Review  Increase Physical Activity;Understanding of Exercise Prescription;Able to understand and use rate of perceived exertion (RPE) scale;Increase Strength and Stamina;Able to understand and use Dyspnea scale;Knowledge and understanding of Target Heart Rate Range (THRR);Able to check pulse independently  Increase Physical Activity;Increase Strength and Stamina;Able to understand and use rate of perceived exertion (RPE) scale;Able to understand and use Dyspnea  scale;Knowledge and understanding of Target Heart Rate Range (THRR);Able to check pulse independently;Understanding of Exercise Prescription      Comments  Reviewed home exercise with pt today.  Pt plans to walk and go to [employer] for exercise.  Reviewed THR, pulse, RPE, sign and symptoms, NTG use, and when to call 911 or MD.  Also discussed weather considerations and indoor options.  Pt voiced understanding.  [place] is doing well in rehab.  He does have to use 4L on some pieces of equipment to keep sats above 88%.      Expected Outcomes  Short: Start to add in more exercise at home.  Long; Continue to improve stamina.  Short:  monitor oxygen while exercising Long: continue to build stamina         Discharge Exercise Prescription (Final Exercise Prescription Changes): Exercise Prescription Changes - 12/20/19 1400      Response to Exercise   Blood Pressure (Admit)  94/60    Blood Pressure (Exercise)  128/72    Blood Pressure (Exit)  92/62    Heart Rate (Admit)  71 bpm  Heart Rate (Exercise)  98 bpm    Heart Rate (Exit)  74 bpm    Oxygen Saturation (Admit)  97 %    Oxygen Saturation (Exercise)  83 %    Oxygen Saturation (Exit)  95 %    Rating of Perceived Exertion (Exercise)  12    Duration  Continue with 30 min of aerobic exercise without signs/symptoms of physical distress.    Intensity  THRR unchanged      Progression   Progression  Continue to progress workloads to maintain intensity without signs/symptoms of physical distress.    Average METs  3.25      Resistance Training   Training Prescription  Yes    Weight  5 lb    Reps  10-15      Interval Training   Interval Training  No      Oxygen   Oxygen  Continuous    Liters  3-4      Treadmill   MPH  2.8    Grade  1    Minutes  15    METs  3.53      REL-XR   Level  6    Watts  50    Minutes  15       Nutrition:  Target Goals: Understanding of nutrition guidelines, daily intake of sodium <1570m, cholesterol  <2011m calories 30% from fat and 7% or less from saturated fats, daily to have 5 or more servings of fruits and vegetables.  Education: Controlling Sodium/Reading Food Labels -Group verbal and written material supporting the discussion of sodium use in heart healthy nutrition. Review and explanation with models, verbal and written materials for utilization of the food label.   Education: General Nutrition Guidelines/Fats and Fiber: -Group instruction provided by verbal, written material, models and posters to present the general guidelines for heart healthy nutrition. Gives an explanation and review of dietary fats and fiber.   Biometrics: Pre Biometrics - 10/28/19 1145      Pre Biometrics   Height  5' 10.75" (1.797 m)    Weight  168 lb 14.4 oz (76.6 kg)    BMI (Calculated)  23.73    Single Leg Stand  30 seconds        Nutrition Therapy Plan and Nutrition Goals: Nutrition Therapy & Goals - 12/09/19 1302      Nutrition Therapy   Diet  Low Na, HH diet    Protein (specify units)  75-80g    Fiber  30 grams    Whole Grain Foods  3 servings    Saturated Fats  12 max. grams    Fruits and Vegetables  5 servings/day    Sodium  1.5 grams      Personal Nutrition Goals   Nutrition Goal  ST: add protein shake either two 10g or one 20 g; optimize by having after workoutLT: Healthy as long as possible    Comments  Shredded mini wheat, cheerios. Ham or bolonga sandwiches with fruit. Pretzles or chips. Meat with two vegetables. Airfryer. Eats fish. Discussed Pulmonary MNT and general HH eating.      Intervention Plan   Intervention  Prescribe, educate and counsel regarding individualized specific dietary modifications aiming towards targeted core components such as weight, hypertension, lipid management, diabetes, heart failure and other comorbidities.;Nutrition handout(s) given to patient.    Expected Outcomes  Short Term Goal: Understand basic principles of dietary content, such as  calories, fat, sodium, cholesterol and nutrients.;Short Term Goal: A plan has  been developed with personal nutrition goals set during dietitian appointment.;Long Term Goal: Adherence to prescribed nutrition plan.       Nutrition Assessments: Nutrition Assessments - 10/27/19 1118      MEDFICTS Scores   Pre Score  23       MEDIFICTS Score Key:          ?70 Need to make dietary changes          40-70 Heart Healthy Diet         ? 40 Therapeutic Level Cholesterol Diet  Nutrition Goals Re-Evaluation:   Nutrition Goals Discharge (Final Nutrition Goals Re-Evaluation):   Psychosocial: Target Goals: Acknowledge presence or absence of significant depression and/or stress, maximize coping skills, provide positive support system. Participant is able to verbalize types and ability to use techniques and skills needed for reducing stress and depression.   Education: Depression - Provides group verbal and written instruction on the correlation between heart/lung disease and depressed mood, treatment options, and the stigmas associated with seeking treatment.   Education: Sleep Hygiene -Provides group verbal and written instruction about how sleep can affect your health.  Define sleep hygiene, discuss sleep cycles and impact of sleep habits. Review good sleep hygiene tips.    Education: Stress and Anxiety: - Provides group verbal and written instruction about the health risks of elevated stress and causes of high stress.  Discuss the correlation between heart/lung disease and anxiety and treatment options. Review healthy ways to manage with stress and anxiety.   Initial Review & Psychosocial Screening: Initial Psych Review & Screening - 10/27/19 0846      Initial Review   Current issues with  Current Stress Concerns    Source of Stress Concerns  Chronic Illness;Unable to participate in former interests or hobbies;Unable to perform yard/household activities    Comments  Pulmonary Fibrosis  and Arthritis keep him from doing everything that he wants      [place]?  Yes   both have several children, grandkids, neighbors     Barriers   Psychosocial barriers to participate in program  The patient should benefit from training in stress management and relaxation.;Psychosocial barriers identified (see note)      Screening Interventions   Interventions  Encouraged to exercise;To provide support and resources with identified psychosocial needs;Provide feedback about the scores to participant    Expected Outcomes  Short Term goal: Utilizing psychosocial counselor, staff and physician to assist with identification of specific Stressors or current issues interfering with healing process. Setting desired goal for each stressor or current issue identified.;Long Term Goal: Stressors or current issues are controlled or eliminated.;Short Term goal: Identification and review with participant of any Quality of Life or Depression concerns found by scoring the questionnaire.;Long Term goal: The participant improves quality of Life and PHQ9 Scores as seen by post scores and/or verbalization of changes       Quality of Life Scores:  Scores of 19 and below usually indicate a poorer quality of life in these areas.  A difference of  2-3 points is a clinically meaningful difference.  A difference of 2-3 points in the total score of the Quality of Life Index has been associated with significant improvement in overall quality of life, self-image, physical symptoms, and general health in studies assessing change in quality of life.  PHQ-9: Recent Review Flowsheet Data    Depression screen [facility] 2/9 12/08/2019 12/01/2019 10/28/2019 09/08/2019 07/19/2019   Decreased Interest 0 0 1  0 0   Down, Depressed, Hopeless 1 0 0 0 0   PHQ - 2 Score 1 0 1 0 0   Altered sleeping '1  1 2 ' - -   Tired, decreased energy '1 1 1 ' - -   Change in appetite 0 0 0 - -   Feeling bad or failure about yourself   0 1 1 - -   Trouble concentrating 0 0 0 - -   Moving slowly or fidgety/restless '1 1 1 ' - -   Suicidal thoughts 0 0 2  - -   PHQ-9 Score '4 4 8 ' - -   Difficult doing work/chores Not difficult at all - Not difficult at all - -     Interpretation of Total Score  Total Score Depression Severity:  1-4 = Minimal depression, 5-9 = Mild depression, 10-14 = Moderate depression, 15-19 = Moderately severe depression, 20-27 = Severe depression   Psychosocial Evaluation and Intervention: Psychosocial Evaluation - 10/27/19 0855      Psychosocial Evaluation & Interventions   Interventions  Stress management education;Encouraged to exercise with the program and follow exercise prescription    Comments  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  He has always been an avid exerciser at [employer] prior to the pandemic last year.  He has rhuematoid arthritis all over which also limits his ability to do things and keeps him in chronic pain.  He has a strong support system in his wife.  He is coming to the program after his doctor recommended that he should be supervised as he gets back into his exercise routine.  He wants to get back to exercise and feel better again.  He wants to be able to breathe and able to do more.    Expected Outcomes  Short: Attend rehab to get back into exercise routine.  Long: Continue to improve breathing.    Continue Psychosocial Services   Follow up required by staff       Psychosocial Re-Evaluation: Psychosocial Re-Evaluation    [place] Name 11/25/19 0744 12/29/19 0756           Psychosocial Re-Evaluation   Current issues with  Current Sleep Concerns  Current Sleep Concerns;Current Stress Concerns      Comments  Pt reports normal sleep is tossing and turning - sometimes will have coughing spells or will get too hot. Pt reports getting enough sleep at night; 8pm-6am.  Dewey sleeps well most of the time.  No major stress concerns.      Expected Outcomes   Pt will continue to manage stress  Short: continue to work on sleep patterns Long:  maintain positive outlook      Interventions  Encouraged to attend Pulmonary Rehabilitation for the exercise  --      Continue Psychosocial Services   Follow up required by staff  --         Psychosocial Discharge (Final Psychosocial Re-Evaluation): Psychosocial Re-Evaluation - 12/29/19 0756      Psychosocial Re-Evaluation   Current issues with  Current Sleep Concerns;Current Stress Concerns    Comments  Craig sleeps well most of the time.  No major stress concerns.    Expected Outcomes  Short: continue to work on sleep patterns Long:  maintain positive outlook       Education: Education Goals: Education classes will be provided on a weekly basis, covering required topics. Participant will state understanding/return demonstration of topics presented.  Learning Barriers/Preferences: Learning  Barriers/Preferences - 10/27/19 6270      Learning Barriers/Preferences   Learning Barriers  None    Learning Preferences  None       General Pulmonary Education Topics:  Infection Prevention: - Provides verbal and written material to individual with discussion of infection control including proper hand washing and proper equipment cleaning during exercise session.   Pulmonary Rehab from 10/28/2019 in [facility]  Date  10/28/19  Educator  [facility]  Instruction Review Code  1- Verbalizes Understanding      Falls Prevention: - Provides verbal and written material to individual with discussion of falls prevention and safety.   Pulmonary Rehab from 10/28/2019 in [facility] Cardiac and Pulmonary Rehab  Date  10/28/19  Educator  [facility]  Instruction Review Code  1- Verbalizes Understanding      Chronic Lung Diseases: - Group verbal and written instruction to review updates, respiratory medications, advancements in procedures and treatments. Discuss use of supplemental oxygen including available  portable oxygen systems, continuous and intermittent flow rates, concentrators, personal use and safety guidelines. Review proper use of inhaler and spacers. Provide informative websites for self-education.    Energy Conservation: - Provide group verbal and written instruction for methods to conserve energy, plan and organize activities. Instruct on pacing techniques, use of adaptive equipment and posture/positioning to relieve shortness of breath.   Triggers and Exacerbations: - Group verbal and written instruction to review types of environmental triggers and ways to prevent exacerbations. Discuss weather changes, air quality and the benefits of nasal washing. Review warning signs and symptoms to help prevent infections. Discuss techniques for effective airway clearance, coughing, and vibrations.   AED/CPR: - Group verbal and written instruction with the use of models to demonstrate the basic use of the AED with the basic ABC's of resuscitation.   Anatomy and Physiology of the Lungs: - Group verbal and written instruction with the use of models to provide basic lung anatomy and physiology related to function, structure and complications of lung disease.   Anatomy & Physiology of the Heart: - Group verbal and written instruction and models provide basic cardiac anatomy and physiology, with the coronary electrical and arterial systems. Review of Valvular disease and Heart Failure   Cardiac Medications: - Group verbal and written instruction to review commonly prescribed medications for heart disease. Reviews the medication, class of the drug, and side effects.   Other: -Provides group and verbal instruction on various topics (see comments)   Knowledge Questionnaire Score: Knowledge Questionnaire Score - 10/27/19 1118      Knowledge Questionnaire Score   Pre Score  15/18 Education Focus: O2 Safety and Exercise        Core Components/Risk Factors/Patient Goals at  Admission: Personal Goals and Risk Factors at Admission - 10/28/19 1145      Core Components/Risk Factors/Patient Goals on Admission    Weight Management  Yes;Weight Maintenance    Intervention  Weight Management: Develop a combined nutrition and exercise program designed to reach desired caloric intake, while maintaining appropriate intake of nutrient and fiber, sodium and fats, and appropriate energy expenditure required for the weight goal.;Weight Management: Provide education and appropriate resources to help participant work on and attain dietary goals.    Admit Weight  168 lb 14.4 oz (76.6 kg)    Goal Weight: Short Term  165 lb (74.8 kg)    Goal Weight: Long Term  165 lb (74.8 kg)    Expected Outcomes  Short Term: Continue to assess  and modify interventions until short term weight is achieved;Long Term: Adherence to nutrition and physical activity/exercise program aimed toward attainment of established weight goal;Weight Maintenance: Understanding of the daily nutrition guidelines, which includes 25-35% calories from fat, 7% or less cal from saturated fats, less than 21m cholesterol, less than 1.5gm of sodium, & 5 or more servings of fruits and vegetables daily    Improve shortness of breath with ADL's  Yes    Intervention  Provide education, individualized exercise plan and daily activity instruction to help decrease symptoms of SOB with activities of daily living.    Expected Outcomes  Short Term: Improve cardiorespiratory fitness to achieve a reduction of symptoms when performing ADLs;Long Term: Be able to perform more ADLs without symptoms or delay the onset of symptoms    Lipids  Yes    Intervention  Provide education and support for participant on nutrition & aerobic/resistive exercise along with prescribed medications to achieve LDL <764m HDL >4066m   Expected Outcomes  Short Term: Participant states understanding of desired cholesterol values and is compliant with medications  prescribed. Participant is following exercise prescription and nutrition guidelines.;Long Term: Cholesterol controlled with medications as prescribed, with individualized exercise RX and with personalized nutrition plan. Value goals: LDL < 104m55mDL > 40 mg.       Education:Diabetes - Individual verbal and written instruction to review signs/symptoms of diabetes, desired ranges of glucose level fasting, after meals and with exercise. Acknowledge that pre and post exercise glucose checks will be done for 3 sessions at entry of program.   Education: Know Your Numbers and Risk Factors: -Group verbal and written instruction about important numbers in your health.  Discussion of what are risk factors and how they play a role in the disease process.  Review of Cholesterol, Blood Pressure, Diabetes, and BMI and the role they play in your overall health.   Core Components/Risk Factors/Patient Goals Review:  Goals and Risk Factor Review    [place] Name 11/25/19 0747 12/29/19 0754           Core Components/Risk Factors/Patient Goals Review   Personal Goals Review  Develop more efficient breathing techniques such as purse lipped breathing and diaphragmatic breathing and practicing self-pacing with activity.  Develop more efficient breathing techniques such as purse lipped breathing and diaphragmatic breathing and practicing self-pacing with activity.;Improve shortness of breath with ADL's      Review  Pt reports not having any significant health concerns.  Stokely uses oxygen as needed at home.  He can tell he has improved since he has been exercising.  He uses PLB.  He has arthritis and it takes him a little longer to get going in the morning      Expected Outcomes  Continue to manage health and practice PLB.  Short:  use PLB to help with ADLs Long:  manage health concerns         Core Components/Risk Factors/Patient Goals at Discharge (Final Review):  Goals and Risk Factor Review - 12/29/19 0754       Core Components/Risk Factors/Patient Goals Review   Personal Goals Review  Develop more efficient breathing techniques such as purse lipped breathing and diaphragmatic breathing and practicing self-pacing with activity.;Improve shortness of breath with ADL's    Review  Tavi uses oxygen as needed at home.  He can tell he has improved since he has been exercising.  He uses PLB.  He has arthritis and it takes him a little longer to get going  in the morning    Expected Outcomes  Short:  use PLB to help with ADLs Long:  manage health concerns       ITP Comments: ITP Comments    [place] Name 10/27/19 0912 10/28/19 1107 11/03/19 0828 11/10/19 0552 12/01/19 0845   ITP Comments  Completed virtual orientation today.  EP evaluation is scheduled for Thursday 3/11 at 930am.  Documentation for diagnosis can be found in [facility] encounter 10/22/2019.  Completed 6MWT and gym orientation.  Initial ITP created and sent for review to Dr. Emily Filbert, Medical Director.  First full day of exercise!  Patient was oriented to gym and equipment including functions, settings, policies, and procedures.  Patient's individual exercise prescription and treatment plan were reviewed.  All starting workloads were established based on the results of the 6 minute walk test done at initial orientation visit.  The plan for exercise progression was also introduced and progression will be customized based on patient's performance and goals.  30 day chart review completed. ITP sent to Dr Zachery Dakins Medical Director, for review,changes as needed and signature. Continue with ITP if no changes requested  Today while on the TM, sats dropped to 85%. Cued PLB and decreased Workload to get sat to 88%   [place] Name 12/08/19 0530 12/08/19 0750 12/09/19 1321 12/16/19 0831 01/05/20 0534   ITP Comments  30 Day review completed. Medical Director review done, changes made as directed,and approval shown by signature of Market researcher.  O2 Sat  Dropping to 85%  today   Oxygen increased to 4 L  Completed Initial RD Eval  Adjusting oyygen while on TM for sat lower than 88%. Change did bring sats up to 93%  30 Day review completed. ITP review done, changes made as directed,and approval shown by signature of  Scientist, research (life sciences).      Comments:

## 2020-01-06 ENCOUNTER — Other Ambulatory Visit: Payer: Self-pay

## 2020-01-06 ENCOUNTER — Ambulatory Visit: Payer: PPO | Admitting: Rheumatology

## 2020-01-06 ENCOUNTER — Ambulatory Visit (INDEPENDENT_AMBULATORY_CARE_PROVIDER_SITE_OTHER): Payer: PPO | Admitting: Internal Medicine

## 2020-01-06 DIAGNOSIS — K521 Toxic gastroenteritis and colitis: Secondary | ICD-10-CM

## 2020-01-06 DIAGNOSIS — J8489 Other specified interstitial pulmonary diseases: Secondary | ICD-10-CM | POA: Diagnosis not present

## 2020-01-06 DIAGNOSIS — M359 Systemic involvement of connective tissue, unspecified: Secondary | ICD-10-CM

## 2020-01-06 DIAGNOSIS — R634 Abnormal weight loss: Secondary | ICD-10-CM | POA: Diagnosis not present

## 2020-01-06 DIAGNOSIS — Z5181 Encounter for therapeutic drug level monitoring: Secondary | ICD-10-CM | POA: Diagnosis not present

## 2020-01-06 DIAGNOSIS — R768 Other specified abnormal immunological findings in serum: Secondary | ICD-10-CM | POA: Diagnosis not present

## 2020-01-06 DIAGNOSIS — J439 Emphysema, unspecified: Secondary | ICD-10-CM

## 2020-01-06 DIAGNOSIS — J849 Interstitial pulmonary disease, unspecified: Secondary | ICD-10-CM

## 2020-01-06 NOTE — Patient Instructions (Addendum)
ICD-10-CM   1. Diarrhea due to drug  K52.1   2. Interstitial lung disease due to connective tissue disease ([place])  J84.89    M35.9   3. Pulmonary emphysema, unspecified emphysema type ([place])  J43.9   4. Rheumatoid factor positive  R76.8   5. Encounter for therapeutic drug monitoring  Z51.81   6. 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 The pattern of pulmonary fibrosis you have is generally progressive The future course is variable I think Imuran and nintedanib will help slow down the progression but unfortunately the disease still has the potential to progress   Plan  -Continue oxygen with exertion and night -Stop nintedanib until further notice -Call us back in 1 week with weight and diarrhea control and leave a message with the triage desk - Continie pulmonary rehabilitation at [place] lung transplant evaluation -Research as a care option: this is a consideration for the future -Support group as an option for the future   Rheumatoid ARthrisi  Pain from RA still an issue  Plan  - -Continue immuran  - COmplete prednisone taper per rheum - call your rheum and ask if you will benefit from plaquenil from pain  Emphysema  - continue spiriva  Weight loss  - c likely due to nintedanib. Plan  -Stop nintedanib -Call with a weight in 1 week -Reassess weight formally mid June 2021 visit  Followup [facility] mid June 2021 visit for pulmonary function test and face-to-face visit in [place]

## 2020-01-06 NOTE — Progress Notes (Signed)
OV 09/10/2019  Subjective:  Patient ID: Lawrence Wells, male , DOB: 29-Jul-1956 , age 64 y.o. , MRN: [id number] , ADDRESS: [place] [id number]   09/10/2019 -   Chief Complaint  Patient presents with  . pulmonary consult    per Dr. Parks Ranger- CXR 09/08/2019. pt reports of sob with exertion, talking and bending, prod cough with yellow mucus, left sided chest discomfort  and wheezing.     HPI Lawrence Wells 64 y.o. -referred for interstitial lung disease after a chest x-ray from September 08, 2019 that I personally visualized and interpreted shows diffuse ILD along with left upper lobe nodule.  Patient has wife tell me that he has had insidious onset of shortness of breath for a year with progression in the last few months.  They also tell me that few years ago he had an chest x-ray done for an incidental unrelated reason that showed chronic changes of scarring but apparently were reassured.  In review of his chart and noticed that in 2005 this is chest x-ray done in the [facility] system with reports of ILD in it.  He is unaware of that.  History for this visit is given by the patient and his wife and review of the chart. [facility] Comprehensive ILD Questionnaire  Symptoms:   Insidious onset of shortness of breath for the last year getting worse in the last 3 to 4 months.  Definitely no dyspnea a few years ago even though chest x-ray from 2005 was reported as ILD.  Sometimes dyspnea is episodic but mostly with exertion relieved by rest.  Symptom severity is listed below.  There is associated significant cough.  Cough started in October 2020.  It is getting worse cough is rated as moderate to severe.  He coughs at night.  He does bring up some yellow phlegm.  It is worse when he lies down.Marland Kitchen  He does clear his throat and he does feel a tickle in the back of the throat.  There is also associated wheezing.       Past Medical History : He gives a history positive  for arthritis.  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  Denies any asthma or known COPD.  Denies heart failure.  Denies scleroderma any collagen vascular disease.  Denies diagnosis of acid reflux or hiatal hernia.  Denies obstructive sleep apnea.  Denies pulmonary hypertension.  Denies diabetes or thyroid disease or stroke or seizures.  Denies infectious mononucleosis.  Denies hepatitis.  Denies tuberculosis denies kidney disease.  Denies blood clots denies heart disease denies pleurisy.   ROS: Positive for fatigue and arthralgia.  Positive for acid reflux.  He went to check the box for [name].  Denies any GI symptoms or rash or ulcers  FAMILY HISTORY of LUNG DISEASE: Denies including COPD and pulmonary fibrosis   EXPOSURE HISTORY: Smokes cigarettes between 1983 and 2007 1 to 2 packs/day and then quit.  Did not smoke cigars did not smoke pipe.  Did not vape.  Never smoked marijuana or cocaine.  Never used intravenous drugs.   HOME and HOBBY DETAILS : [place].  The age of the home is 20 years.  There is no dampness in the living environment.  No mold or mildew.  Does not use humidifier.  No CPAP use.  Does not use nebulizer.  No steam iron use.  No Jacuzzi.  No pet birds or parakeets in the house.  No pet gerbils.  No feather pillows.  No mold in the [facility] duct.  Does not play wind instruments.  Does not do any gardening.   OCCUPATIONAL HISTORY (122 questions) : Works in the Transport planner for 30 years.  Worked in [employer] and crawl spaces.  A lot of dust exposure.  During this time he worked in damp air-conditioned spaces.  He thinks he had asbestos exposure.  He also worked in the [employer].  He cleaned AC coils.  Otherwise history is negative   PULMONARY TOXICITY HISTORY (27 items): Entirely  negative.       ROS - per HPI    OV 10/22/2019  Subjective:  Patient ID: Lawrence Wells, male , DOB: [birth year] , age 64 y.o. , MRN: [id number] , ADDRESS: [place] [id number]   10/22/2019 -   Chief Complaint  Patient presents with  . Follow-up    ILD Followup. SOB on exertion. Non prod cough. Pt denies any wheezing, fever, chills, or sweats.   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  Started on nintedanib 10/12/2019  Associated emphysema present on CT scan January 2021  Normal cardiac stress test 10/14/2019   HPI Lawrence Wells 64 y.o. -presents for follow-up with his wife.  At last saw him end of January 2021 for ILD evaluation.  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  He then saw a rheumatologist Dr. D yesterday 10/21/2019 and given a diagnosis of joint rheumatoid arthritis.  In the interim he also saw pulmonary nurse practitioner for a virtual visit and was started on nintedanib but she started Kite on 10/12/2019.  So far is tolerating it fine.  He is using oxygen only with exertion.  There is no deterioration in symptoms.  Yesterday the rheumatology visit Imuran has been recommended first rheumatoid arthritis.  The TPMT test has been done and the results are pending.  He does not have liver function tests checked after he started his nintedanib.  He and his wife have many questions about the future course of the disease and natural history and other management strategies.  Currently is free was not helping him but he is willing to take it because of the associated emphysema.   I personally visualized and reviewed the  CT and also interpreted the findings myself   OV 12/02/2019  Subjective:  Patient ID: Lawrence Wells, male , DOB: [birth year] , age 64 y.o. , MRN: [id number] , ADDRESS: [place] [id number]   12/02/2019 -   Chief Complaint  Patient presents with  . Follow-up    Follow-up rheumatoid arthritis-ILD/UIP pattern (based on high titer rheumatoid factor 09/10/2019, UIP on CT scan 09/14/2019 and rheumatology visit confirming diagnosis of rheumatoid arthritis on 10/21/2019].  Started on nintedanib 10/12/2019  Associated emphysema present on CT scan January 2021  Normal cardiac stress test 10/14/2019  Rheumatoid arthritis on Imuran  HPI Lawrence Wells 64 y.o. -6 returns for follow-up with his wife to the [place]  cl[facility].  He is a Engineer, manufacturing systems.  He normally seen in the [place] clinic.  At this point in time he tells me that he is tolerating the nintedanib fine.  He needs a liver function test.  However he has lost weight 160 pounds.  He does not know the reason.  He is tolerating nintedanib fine.  He is on Imuran for his rheumatoid arthritis.  In terms of his effort tolerance it is stable.  He is undergoing pulmonary rehabilitation and is using 3-4 L of oxygen with exertion.  He is still on the waiting list for a portable system.  He is willing to be seen by the transplant team for pulmonary fibrosis..    In terms of his rheumatoid arthritis he is on Imuran he is on a prednisone taper but still having pain.  He had her steroid injections.  He is not on Plaquenil.  I have encouraged him to talk to his rheumatologist about it.  Overall stable  Overall symptom score listed below.   OV 01/06/2020  Subjective:  Patient ID: Lawrence Wells, male , DOB: [birth year] , age 28 y.o. , MRN: [id number] , ADDRESS: [address] [place] [id number]   Follow-up rheumatoid arthritis-ILD/UIP pattern (based on high titer rheumatoid factor 09/10/2019, UIP on CT scan 09/14/2019 and  rheumatology visit confirming diagnosis of rheumatoid arthritis on 10/21/2019].  Started on nintedanib 10/12/2019  Associated emphysema present on CT scan January 2021  Normal cardiac stress test 10/14/2019  Rheumatoid arthritis on Imuran   01/06/2020 -     HPI Lawrence Wells 64 y.o. -there is a telephone visit.  Patient identified with 2 person identifier.  Wife also came on the phone.  He tells me in the wife also attest that since starting nintedanib he has continued to lose weight.  Today the weight is 159 pounds.  Diarrhea has started despite taking Imodium once a day.  The diarrhea is severe as documented by a level 4.  He is undergoing pulmonary rehabilitation and this is helped dyspnea but he is dealing with significant pain from the rheumatoid arthritis.  He is undergoing transplant evaluation at [facility] and is quite busy with that.  He has upcoming appointment with me in mid June 2021 for a face-to-face for pulmonary function test and follow-up.  But at this point in time symptoms of weight loss and diarrhea are paramount.    SYMPTOM SCALE - ILD 12/02/2019 On ofev, immuran, rehab  wegt 163# 159# 01/06/2020   O2 use ra   Shortness of Breath 0 -> 5 scale with 5 being worst (score 6 If unable to do)   At rest 0   Simple tasks - showers, clothes change, eating, shaving 3   Household (dishes, doing bed, laundry) 3   Shopping 4   Walking level at own pace 3.5   Walking up Stairs 4 4  Total (30-36) Dyspnea Score 18   How bad is your cough? 3 2  How bad is your fatigue 3   How bad is nausea 0   How bad is vomiting?  0   How bad is diarrhea? 0 4 despite immodium 1 per day  How bad is anxiety? 2   How bad is depression 2   pain  2        Simple office walk 185 feet x  3 laps goal with forehead probe 09/10/2019  10/22/2019   O2 used ra ra - walk, uses 2L portable  at home with exerion  Number laps completed 3 3  Comments about pace mod   Resting Pulse Ox/HR 98% and  74/min 95% and 71  Final Pulse Ox/HR 88% and 96/min 86% and 101  Desaturated </= 88% yes yes  Desaturated <= 3% points Yes, 10 points Yes, 9 pon  Got Tachycardic >/= 90/min yes yes  Symptoms at end of test Yes "pretty heavy" yes  Miscellaneous comments Corrected 2L        ROS - per HPI     has a past medical history of Arthritis, COPD (chronic obstructive pulmonary disease) ([place]), Coronary artery disease, Dyspnea, Emphysema lung ([place]), Hyperlipidemia, and Pulmonary filariasis.   reports that he quit smoking about 14 years ago. His smoking use included cigarettes. He has a 45.00 pack-year smoking history. He quit smokeless tobacco use about 14 years ago.  His smokeless tobacco use included chew.  Past Surgical History:  Procedure Laterality Date  . SINUS SURGERY WITH INSTATRAK      Allergies  Allergen Reactions  . Atorvastatin Other (See Comments)    Myalgias    Immunization History  Administered Date(s) Administered  . PFIZER SARS-COV-2 Vaccination 11/22/2019, 12/15/2019  . Pneumococcal Conjugate-13 09/08/2019  . Tdap 04/19/2015, 05/07/2017    Family History  Problem Relation Age of Onset  . Heart disease Mother   . Heart attack Mother 45  . Arthritis Mother   . Heart disease Father   . Heart attack Father 35  . Healthy Sister   . Hyperlipidemia Brother   . Throat cancer Brother 79  . Heart disease Brother   . Healthy Son   . Healthy Daughter      Current Outpatient Medications:  .  azaTHIOprine (IMURAN) 50 MG tablet, Take 2 tablets (100 mg total) by mouth daily., Disp: 180 tablet, Rfl: 0 .  diclofenac Sodium (VOLTAREN) 1 % GEL, Apply 2 g topically 3 (three) times daily as needed., Disp: 100 g, Rfl: 2 .  ezetimibe (ZETIA) 10 MG tablet, Take 1 tablet (10 mg total) by mouth daily., Disp: 90 tablet, Rfl: 3 .  gabapentin (NEURONTIN) 100 MG capsule, Start 1 capsule daily, increase by 1 cap every 2-3 days as tolerated up to 3 times a day, or may take 3 at once in  evening., Disp: 90 capsule, Rfl: 3 .  Multiple Vitamin (MULTIVITAMIN) tablet, Take 1 tablet by mouth daily., Disp: , Rfl:  .  naproxen (NAPROSYN) 500 MG tablet, Take 1 tablet (500 mg total) by mouth 2 (two) times daily with a meal. For 2-4 weeks then as needed, Disp: 60 tablet, Rfl: 3 .  OFEV 150 MG CAPS, TAKE 1 CAPSULE BY MOUTH TWICE DAILY WITH FOOD. CALL [phone] FOR REFILLS, Disp: 60 capsule, Rfl: 11 .  OXYGEN, Inhale into the lungs as needed., Disp: , Rfl:  .  Tiotropium Bromide Monohydrate (SPIRIVA RESPIMAT) 2.5 MCG/ACT AERS, Inhale 2 puffs into the lungs daily., Disp: 1 g, Rfl: 5      Objective:   There were no vitals filed for this visit.  Estimated body mass index is 22.9 kg/m as calculated from the following:   Height as of 12/23/19: 5\' 11"  (1.803 m).   Weight as of 12/23/19: 164 lb 3.2 oz (74.5 kg).  @WEIGHTCHANGE @  There were no vitals filed for this visit.   Physical Exam        Assessment:       ICD-10-CM   1. Diarrhea due to drug  K52.1   2. Interstitial  lung disease due to connective tissue disease ([place])  J84.89    M35.9   3. Pulmonary emphysema, unspecified emphysema type ([place])  J43.9   4. Rheumatoid factor positive  R76.8   5. Encounter for therapeutic drug monitoring  Z51.81   6. Weight loss  R63.4        Plan:     Patient Instructions     ICD-10-CM   1. Interstitial lung disease due to connective tissue disease ([place])  J84.89    M35.9   2. Pulmonary emphysema, unspecified emphysema type ([place])  J43.9   3. 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 at ptipff@gmail .com for local support group information  Continie pulmonary rehabilitation at [facility]  - REfer [place] lung Transplant team  -Research as a care option: this is a consideration for the future  - do spiro/dlco in 8-12 weeks  Rheumatoid ARthrisi  Pain from RA still an issue  Plan  - -Continue immuran  - COmplete prednisone taper per rheum - call your rheum and ask if you will benefit from plaquenil from pain  Emphysema  - continue spiriva  Weight loss  - could be due to RA. Could be due to [place]  - monitor ince a week at home - call if any concern  Followup - 4-6 weeks tele visit with Dr Chase Caller  Or an APP  - 8-12 weeks do spiro and dlco at [facility] clinic - 8-12 weeks return to see Dr Chase Caller in [facility] clinic          SIGNATURE    Dr. Brand Males, M.D., F.C.C.P,  Pulmonary and Critical Care Medicine Staff Physician, [place] Director - Interstitial Lung Disease  Program  Pulmonary [place] at [place], [place], [address]  Pager: [phone], If no answer or between  15:00h - 7:00h: call [phone] Telephone: [phone]  9:15 AM 01/06/2020

## 2020-01-06 NOTE — Progress Notes (Signed)
Daily Session Note  Patient Details  Name: Lawrence Wells MRN: [id number] Date of Birth: 19-Jul-1956 Referring Provider:     Pulmonary Rehab from 10/28/2019 in [facility] Cardiac and Pulmonary Rehab  Referring Provider  Brand Males MD      Encounter Date: 01/06/2020  Check In: Session Check In - 01/06/20 0752      Check-In   Supervising physician immediately available to respond to emergencies  See telemetry face sheet for immediately available ER MD    Location  [facility]-Cardiac & Pulmonary Rehab    Staff Present  Vida Rigger RN, BSN;Jessica [place], MA, RCEP, CCRP, CCET;Amanda Sommer, [place], ACSM CEP, Exercise Physiologist;Melissa Caiola RDN, LDN    Virtual Visit  No    Medication changes reported      No    Fall or balance concerns reported     No    Warm-up and Cool-down  Performed on first and last piece of equipment    Resistance Training Performed  Yes    PAD/SET Patient?  No      Pain Assessment   Currently in Pain?  No/denies          Social History   Tobacco Use  Smoking Status Former Smoker  . Packs/day: 1.50  . Years: 30.00  . Pack years: 45.00  . Types: Cigarettes  . Quit date: 2007  . Years since quitting: 14.3  Smokeless Tobacco Former Systems developer  . Types: Chew  . Quit date: 2007  Tobacco Comment   Dip smokeless tobacco >20-30 years    Goals Met:  Proper associated with RPD/PD & O2 Sat Independence with exercise equipment Exercise tolerated well No report of cardiac concerns or symptoms Strength training completed today  Goals Unmet:  Not Applicable  Comments: Pt able to follow exercise prescription today without complaint.  Will continue to monitor for progression.   Dr. Emily Filbert is Medical Director for [facility] and [facility].

## 2020-01-09 DIAGNOSIS — J849 Interstitial pulmonary disease, unspecified: Secondary | ICD-10-CM | POA: Diagnosis not present

## 2020-01-10 ENCOUNTER — Telehealth: Payer: Self-pay | Admitting: Internal Medicine

## 2020-01-10 DIAGNOSIS — Z7682 Awaiting organ transplant status: Secondary | ICD-10-CM | POA: Insufficient documentation

## 2020-01-10 DIAGNOSIS — R0602 Shortness of breath: Secondary | ICD-10-CM | POA: Diagnosis not present

## 2020-01-10 DIAGNOSIS — Z9981 Dependence on supplemental oxygen: Secondary | ICD-10-CM | POA: Insufficient documentation

## 2020-01-10 DIAGNOSIS — R911 Solitary pulmonary nodule: Secondary | ICD-10-CM | POA: Insufficient documentation

## 2020-01-10 DIAGNOSIS — J8410 Pulmonary fibrosis, unspecified: Secondary | ICD-10-CM | POA: Diagnosis not present

## 2020-01-10 DIAGNOSIS — Z5181 Encounter for therapeutic drug level monitoring: Secondary | ICD-10-CM | POA: Diagnosis not present

## 2020-01-10 DIAGNOSIS — J9611 Chronic respiratory failure with hypoxia: Secondary | ICD-10-CM | POA: Insufficient documentation

## 2020-01-10 DIAGNOSIS — Z6740 Type O blood, Rh positive: Secondary | ICD-10-CM | POA: Diagnosis not present

## 2020-01-10 DIAGNOSIS — J432 Centrilobular emphysema: Secondary | ICD-10-CM | POA: Diagnosis not present

## 2020-01-10 DIAGNOSIS — R970 Elevated carcinoembryonic antigen [CEA]: Secondary | ICD-10-CM | POA: Diagnosis not present

## 2020-01-10 DIAGNOSIS — Z125 Encounter for screening for malignant neoplasm of prostate: Secondary | ICD-10-CM | POA: Diagnosis not present

## 2020-01-10 DIAGNOSIS — U071 COVID-19: Secondary | ICD-10-CM | POA: Diagnosis not present

## 2020-01-10 DIAGNOSIS — Z79899 Other long term (current) drug therapy: Secondary | ICD-10-CM | POA: Diagnosis not present

## 2020-01-10 DIAGNOSIS — Z01818 Encounter for other preprocedural examination: Secondary | ICD-10-CM | POA: Diagnosis not present

## 2020-01-10 NOTE — Telephone Encounter (Signed)
Jody wife states patient had PFT 01/10/2020 at [facility]. Patient scheduled for PFT in our office on 01/27/2020. Would like to know if they need to keep this appointment. Jody phone number is [phone].     Dr.Ramaswamy can you please advise.

## 2020-01-11 DIAGNOSIS — I6523 Occlusion and stenosis of bilateral carotid arteries: Secondary | ICD-10-CM | POA: Diagnosis not present

## 2020-01-11 DIAGNOSIS — Z7682 Awaiting organ transplant status: Secondary | ICD-10-CM | POA: Diagnosis not present

## 2020-01-12 DIAGNOSIS — Z7682 Awaiting organ transplant status: Secondary | ICD-10-CM | POA: Diagnosis not present

## 2020-01-12 DIAGNOSIS — Z008 Encounter for other general examination: Secondary | ICD-10-CM | POA: Diagnosis not present

## 2020-01-12 DIAGNOSIS — F4323 Adjustment disorder with mixed anxiety and depressed mood: Secondary | ICD-10-CM | POA: Diagnosis not present

## 2020-01-12 NOTE — Telephone Encounter (Signed)
No need for pft with [place] given proximity of PFT at [place]

## 2020-01-13 DIAGNOSIS — Z01818 Encounter for other preprocedural examination: Secondary | ICD-10-CM | POA: Insufficient documentation

## 2020-01-13 NOTE — Telephone Encounter (Signed)
Called and spoke with pt letting him know that MR said he did not need to do the PFT at our office which was scheduled for 6/10 and he verbalized understanding. I have cancelled that PFT and covid test appts.  While speaking with pt and his wife [name], she stated that after pt had the covid test and the PFT at [facility], they received a call from [facility] stating that pt's covid test came back positive. Pt stated that he was not having any symptoms.  They stated that pt has been self-quarantining. MR, please advise on any other recs.

## 2020-01-14 NOTE — Telephone Encounter (Signed)
Should be referred TODAY 01/14/2020 without waiting to respiratory clinic for setting up  MAb finsuion if he qualifies which I am sure he does. He is high risk. Copying Lazaro Arms. But please calll resp clinic and ensure that they know and they cal patient. Not sure if Kenney Houseman is working 01/14/2020   He will likelky end up wit mild infection only because he had vaccine based on what we are seeing so far in vaccinated people but I think still good idea for him to get MAb infusion and contacts to get MAb subcutaneous prophylaix if this is available   has a past medical history of Arthritis, COPD (chronic obstructive pulmonary disease) ([place]), Coronary artery disease, Dyspnea, Emphysema lung ([place]), Hyperlipidemia, and Pulmonary filariasis.  Allergies  Allergen Reactions  . Atorvastatin Other (See Comments)    Myalgias    Immunization History  Administered Date(s) Administered  . PFIZER SARS-COV-2 Vaccination 11/22/2019, 12/15/2019  . Pneumococcal Conjugate-13 09/08/2019  . Tdap 04/19/2015, 05/07/2017     SIGNATURE    Dr. Brand Males, M.D., F.C.C.P,  Pulmonary and Critical Care Medicine Staff Physician, [place] Director - Interstitial Lung Disease  Program  Pulmonary [place] at [place], [place], [address]  Pager: [phone], If no answer or between  15:00h - 7:00h: call [phone] Telephone: [phone]  6:12 AM 01/14/2020

## 2020-01-14 NOTE — Telephone Encounter (Signed)
Routing to [facility] for her to contact pt.

## 2020-01-18 ENCOUNTER — Telehealth: Payer: Self-pay | Admitting: Nurse Practitioner

## 2020-01-18 NOTE — Telephone Encounter (Signed)
Routing to [employer] so they can also try to see if they are able to reach pt or his wife Jody to get pt scheduled for the appt.

## 2020-01-18 NOTE — Telephone Encounter (Signed)
Message received from Lazaro Arms, NP: Patient didn't qualify for MAB infusion due to denying any symptoms. He states that he has needed to increase liter flow of O2 which also disqualifies him unfortunately. He thinks this is due to recently stopping OFEV and not Covid - hard to tell. Please schedule a follow up appointment with Dr. Chase Caller as soon as possible or an APP just to make sure he is still doing well. So far he states that he is doing really well. We think this is because he had both vaccines already.    Attempted to call pt to get virtual appt scheduled with MR, but unable to reach and unable to leave VM. Will try to call back later. If pt returns call, please schedule pt a virtual visit (whether it is televisit or mychart visit) with MR or APP first avail. Pt can be scheduled to have this done when MR is in [place] office.

## 2020-01-18 NOTE — Telephone Encounter (Signed)
Attempted to call pulmonary rehab at [facility] at [phone] but unable to reach anyone and unable to leave a VM. Will try to call back later.

## 2020-01-18 NOTE — Telephone Encounter (Signed)
Called toDiscuss with patient about Covid symptoms and the use of bamlanivimab, a monoclonal antibody infusion for those with mild to moderate Covid symptoms and at a high risk of hospitalization.   Pt denies any symptoms at this time. Patient has needed to increase O2 flow, but thinks this is due to [place] recently being discontinued. Unfortunately this disqualifies him from getting MAB. Dr. Chase Caller notified.

## 2020-01-18 NOTE — Telephone Encounter (Signed)
Called to Discuss with patient about Covid symptoms and the use of bamlanivimab, a monoclonal antibody infusion for those with mild to moderate Covid symptoms and at a high risk of hospitalization.     Pt denies any symptoms at this time.    Unable to reach pt

## 2020-01-18 NOTE — Telephone Encounter (Signed)
Contacted this patient for MAB - he denies any symptoms. He states that he has needed to increase O2 flow to 4 L and thinks this is due to Ofev being D/C'd.. I am going to discuss this case with the [facility] team today. He would like for someone to contact pulmonary rehab to let them know that he won't be there this week.

## 2020-01-19 ENCOUNTER — Encounter (HOSPITAL_COMMUNITY): Payer: Self-pay | Admitting: Emergency Medicine

## 2020-01-19 ENCOUNTER — Other Ambulatory Visit: Payer: Self-pay

## 2020-01-19 ENCOUNTER — Ambulatory Visit (INDEPENDENT_AMBULATORY_CARE_PROVIDER_SITE_OTHER): Payer: PPO | Admitting: Primary Care

## 2020-01-19 ENCOUNTER — Telehealth: Payer: Self-pay | Admitting: Internal Medicine

## 2020-01-19 ENCOUNTER — Inpatient Hospital Stay (HOSPITAL_COMMUNITY)
Admission: EM | Admit: 2020-01-19 | Discharge: 2020-01-23 | DRG: 177 | Disposition: A | Discharge status: Home / Self Care | Payer: PPO | Attending: Internal Medicine | Admitting: Internal Medicine

## 2020-01-19 ENCOUNTER — Encounter: Payer: Self-pay | Admitting: Primary Care

## 2020-01-19 ENCOUNTER — Emergency Department (HOSPITAL_COMMUNITY): Payer: PPO

## 2020-01-19 ENCOUNTER — Telehealth: Payer: Self-pay | Admitting: Primary Care

## 2020-01-19 DIAGNOSIS — Z8261 Family history of arthritis: Secondary | ICD-10-CM | POA: Diagnosis not present

## 2020-01-19 DIAGNOSIS — Z9981 Dependence on supplemental oxygen: Secondary | ICD-10-CM | POA: Diagnosis not present

## 2020-01-19 DIAGNOSIS — D72829 Elevated white blood cell count, unspecified: Secondary | ICD-10-CM | POA: Diagnosis not present

## 2020-01-19 DIAGNOSIS — E785 Hyperlipidemia, unspecified: Secondary | ICD-10-CM | POA: Diagnosis present

## 2020-01-19 DIAGNOSIS — Z83438 Family history of other disorder of lipoprotein metabolism and other lipidemia: Secondary | ICD-10-CM

## 2020-01-19 DIAGNOSIS — Z87891 Personal history of nicotine dependence: Secondary | ICD-10-CM | POA: Diagnosis not present

## 2020-01-19 DIAGNOSIS — Z8619 Personal history of other infectious and parasitic diseases: Secondary | ICD-10-CM | POA: Diagnosis not present

## 2020-01-19 DIAGNOSIS — J849 Interstitial pulmonary disease, unspecified: Secondary | ICD-10-CM

## 2020-01-19 DIAGNOSIS — I2510 Atherosclerotic heart disease of native coronary artery without angina pectoris: Secondary | ICD-10-CM | POA: Insufficient documentation

## 2020-01-19 DIAGNOSIS — J9621 Acute and chronic respiratory failure with hypoxia: Secondary | ICD-10-CM | POA: Diagnosis present

## 2020-01-19 DIAGNOSIS — Z808 Family history of malignant neoplasm of other organs or systems: Secondary | ICD-10-CM | POA: Diagnosis not present

## 2020-01-19 DIAGNOSIS — U071 COVID-19: Secondary | ICD-10-CM

## 2020-01-19 DIAGNOSIS — M1990 Unspecified osteoarthritis, unspecified site: Secondary | ICD-10-CM | POA: Diagnosis not present

## 2020-01-19 DIAGNOSIS — I4891 Unspecified atrial fibrillation: Secondary | ICD-10-CM | POA: Diagnosis present

## 2020-01-19 DIAGNOSIS — Z8249 Family history of ischemic heart disease and other diseases of the circulatory system: Secondary | ICD-10-CM | POA: Diagnosis not present

## 2020-01-19 DIAGNOSIS — J449 Chronic obstructive pulmonary disease, unspecified: Secondary | ICD-10-CM | POA: Diagnosis not present

## 2020-01-19 DIAGNOSIS — Z791 Long term (current) use of non-steroidal anti-inflammatories (NSAID): Secondary | ICD-10-CM | POA: Diagnosis not present

## 2020-01-19 DIAGNOSIS — E7800 Pure hypercholesterolemia, unspecified: Secondary | ICD-10-CM | POA: Diagnosis present

## 2020-01-19 DIAGNOSIS — J8410 Pulmonary fibrosis, unspecified: Secondary | ICD-10-CM | POA: Diagnosis not present

## 2020-01-19 DIAGNOSIS — M0579 Rheumatoid arthritis with rheumatoid factor of multiple sites without organ or systems involvement: Secondary | ICD-10-CM | POA: Diagnosis not present

## 2020-01-19 DIAGNOSIS — J069 Acute upper respiratory infection, unspecified: Secondary | ICD-10-CM

## 2020-01-19 DIAGNOSIS — R0902 Hypoxemia: Secondary | ICD-10-CM

## 2020-01-19 DIAGNOSIS — I5032 Chronic diastolic (congestive) heart failure: Secondary | ICD-10-CM | POA: Diagnosis present

## 2020-01-19 DIAGNOSIS — Z79899 Other long term (current) drug therapy: Secondary | ICD-10-CM | POA: Diagnosis not present

## 2020-01-19 DIAGNOSIS — Z7952 Long term (current) use of systemic steroids: Secondary | ICD-10-CM | POA: Insufficient documentation

## 2020-01-19 DIAGNOSIS — J432 Centrilobular emphysema: Secondary | ICD-10-CM | POA: Diagnosis not present

## 2020-01-19 DIAGNOSIS — I472 Ventricular tachycardia: Secondary | ICD-10-CM | POA: Diagnosis present

## 2020-01-19 DIAGNOSIS — J439 Emphysema, unspecified: Secondary | ICD-10-CM | POA: Insufficient documentation

## 2020-01-19 DIAGNOSIS — R0602 Shortness of breath: Secondary | ICD-10-CM | POA: Diagnosis not present

## 2020-01-19 LAB — CBC
HCT: 45.7 % (ref 39.0–52.0)
Hemoglobin: 14.9 g/dL (ref 13.0–17.0)
MCH: 31.8 pg (ref 26.0–34.0)
MCHC: 32.6 g/dL (ref 30.0–36.0)
MCV: 97.4 fL (ref 80.0–100.0)
Platelets: 490 10*3/uL — ABNORMAL HIGH (ref 150–400)
RBC: 4.69 MIL/uL (ref 4.22–5.81)
RDW: 13.2 % (ref 11.5–15.5)
WBC: 19.5 10*3/uL — ABNORMAL HIGH (ref 4.0–10.5)
nRBC: 0 % (ref 0.0–0.2)

## 2020-01-19 LAB — BASIC METABOLIC PANEL
Anion gap: 9 (ref 5–15)
BUN: 34 mg/dL — ABNORMAL HIGH (ref 8–23)
CO2: 25 mmol/L (ref 22–32)
Calcium: 9.5 mg/dL (ref 8.9–10.3)
Chloride: 102 mmol/L (ref 98–111)
Creatinine, Ser: 1.17 mg/dL (ref 0.61–1.24)
GFR calc Af Amer: >60 mL/min (ref >60)
GFR calc non Af Amer: >60 mL/min (ref >60)
Glucose, Bld: 142 mg/dL — ABNORMAL HIGH (ref 70–99)
Potassium: 4.9 mmol/L (ref 3.5–5.1)
Sodium: 136 mmol/L (ref 135–145)

## 2020-01-19 LAB — HIV ANTIBODY (ROUTINE TESTING W REFLEX): HIV Screen 4th Generation wRfx: NONREACTIVE

## 2020-01-19 LAB — TROPONIN I (HIGH SENSITIVITY)
Troponin I (High Sensitivity): 15 ng/L (ref <18)
Troponin I (High Sensitivity): 15 ng/L (ref <18)

## 2020-01-19 LAB — FERRITIN: Ferritin: 255 ng/mL (ref 24–336)

## 2020-01-19 LAB — PROCALCITONIN: Procalcitonin: <0.1 ng/mL

## 2020-01-19 LAB — ABO/RH: ABO/RH(D): O POS

## 2020-01-19 MED ORDER — SODIUM CHLORIDE 0.9 % IV SOLN
100.0000 mg | Freq: Every day | INTRAVENOUS | Status: AC
  Administered 2020-01-20 – 2020-01-23 (×4): 100 mg via INTRAVENOUS
  Filled 2020-01-19 (×4): qty 20

## 2020-01-19 MED ORDER — SODIUM CHLORIDE 0.9 % IV SOLN
200.0000 mg | Freq: Once | INTRAVENOUS | Status: AC
  Administered 2020-01-19: 200 mg via INTRAVENOUS
  Filled 2020-01-19: qty 40

## 2020-01-19 MED ORDER — DEXAMETHASONE SODIUM PHOSPHATE 10 MG/ML IJ SOLN
6.0000 mg | INTRAMUSCULAR | Status: DC
  Administered 2020-01-19: 6 mg via INTRAVENOUS
  Filled 2020-01-19: qty 1

## 2020-01-19 MED ORDER — EZETIMIBE 10 MG PO TABS
10.0000 mg | ORAL_TABLET | Freq: Every day | ORAL | Status: DC
  Administered 2020-01-19 – 2020-01-23 (×5): 10 mg via ORAL
  Filled 2020-01-19 (×5): qty 1

## 2020-01-19 MED ORDER — IPRATROPIUM-ALBUTEROL 20-100 MCG/ACT IN AERS
1.0000 | INHALATION_SPRAY | Freq: Four times a day (QID) | RESPIRATORY_TRACT | Status: DC
  Administered 2020-01-19 – 2020-01-23 (×14): 1 via RESPIRATORY_TRACT
  Filled 2020-01-19: qty 4

## 2020-01-19 MED ORDER — GUAIFENESIN-DM 100-10 MG/5ML PO SYRP: 10.0000 mL | ORAL_SOLUTION | ORAL | Status: DC | PRN

## 2020-01-19 MED ORDER — ENOXAPARIN SODIUM 40 MG/0.4ML ~~LOC~~ SOLN
40.0000 mg | SUBCUTANEOUS | Status: DC
  Administered 2020-01-19 – 2020-01-20 (×2): 40 mg via SUBCUTANEOUS
  Filled 2020-01-19 (×2): qty 0.4

## 2020-01-19 MED ORDER — SODIUM CHLORIDE 0.9% FLUSH: 3.0000 mL | Freq: Once | INTRAVENOUS | Status: DC

## 2020-01-19 MED ORDER — ZINC SULFATE 220 (50 ZN) MG PO CAPS
220.0000 mg | ORAL_CAPSULE | Freq: Every day | ORAL | Status: DC
  Administered 2020-01-19 – 2020-01-23 (×5): 220 mg via ORAL
  Filled 2020-01-19 (×5): qty 1

## 2020-01-19 MED ORDER — ASCORBIC ACID 500 MG PO TABS
500.0000 mg | ORAL_TABLET | Freq: Every day | ORAL | Status: DC
  Administered 2020-01-19 – 2020-01-23 (×5): 500 mg via ORAL
  Filled 2020-01-19 (×5): qty 1

## 2020-01-19 MED ORDER — POLYETHYLENE GLYCOL 3350 17 G PO PACK: 17.0000 g | PACK | Freq: Every day | ORAL | Status: DC | PRN

## 2020-01-19 NOTE — ED Provider Notes (Signed)
[facility] EMERGENCY DEPARTMENT Provider Note   CSN: [id number] Arrival date & time: 01/19/20  1324     History Chief Complaint  Patient presents with  . Shortness of Breath    Lawrence Wells is a 64 y.o. male.  Patient is a 64 year old male with a history of RA, pulmonary fibrosis, prior smoker who is immune compromised on Imuran and has received both Pfizer vaccines in April presents today after testing Covid +9 days ago with symptoms of shortness of breath, cough, anorexia and general fatigue.  Patient reports that he normally does not require oxygen at baseline but over the last 2 days he has been wearing 4 L constantly.  At rest he does not report significant shortness of breath but becomes extremely winded with only minimal activity like walking from one room to another in his home.  He has felt chilled and feverish but every time he is checked his temperature its been normal.  He denies any vomiting or diarrhea.  Patient spoke with his pulmonologist today and did a televisit and they recommended he come to the emergency room as they felt that he would need remdesivir.  Patient currently has no complaints.  He denies any chest or abdominal pain but does state it feels like there is something in his lungs.  He has taken all of his medications today.   Shortness of Breath      Past Medical History:  Diagnosis Date  . Arthritis   . COPD (chronic obstructive pulmonary disease) ([place])   . Coronary artery disease   . Dyspnea   . Emphysema lung ([place])   . Hyperlipidemia   . Pulmonary filariasis     Patient Active Problem List   Diagnosis Date Noted  . Rheumatoid arthritis involving multiple sites with positive rheumatoid factor ([place]) 12/28/2019  . ILD (interstitial lung disease) ([place]) 12/28/2019  . Coronary artery calcification 09/26/2019  . Pulmonary fibrosis ([place]) 09/08/2019  . Myalgia due to statin 09/08/2019  . Chronic hip pain, bilateral 06/09/2017  .  Chronic bilateral low back pain with bilateral sciatica 06/09/2017  . Osteoarthritis of multiple joints 05/07/2017  . Genital herpes 05/07/2017  . Screening for prostate cancer 05/07/2017  . Hypercholesterolemia 03/30/2003  . Former smoker 02/26/1995    Past Surgical History:  Procedure Laterality Date  . SINUS SURGERY WITH INSTATRAK         Family History  Problem Relation Age of Onset  . Heart disease Mother   . Heart attack Mother 94  . Arthritis Mother   . Heart disease Father   . Heart attack Father 85  . Healthy Sister   . Hyperlipidemia Brother   . Throat cancer Brother 71  . Heart disease Brother   . Healthy Son   . Healthy Daughter     Social History   Tobacco Use  . Smoking status: Former Smoker    Packs/day: 1.50    Years: 30.00    Pack years: 45.00    Types: Cigarettes    Quit date: 2007    Years since quitting: 14.4  . Smokeless tobacco: Former Systems developer    Types: Chew    Quit date: 2007  . Tobacco comment: Dip smokeless tobacco >20-30 years  Substance Use Topics  . Alcohol use: Yes    Comment: occ  . Drug use: No    Home Medications Prior to Admission medications   Medication Sig Start Date End Date Taking? Authorizing Provider  azaTHIOprine (IMURAN) 50  MG tablet Take 2 tablets (100 mg total) by mouth daily. 12/23/19   Ofilia Neas, PA-C  diclofenac Sodium (VOLTAREN) 1 % GEL Apply 2 g topically 3 (three) times daily as needed. 07/19/19   Karamalegos, Devonne Doughty, DO  ezetimibe (ZETIA) 10 MG tablet Take 1 tablet (10 mg total) by mouth daily. 10/22/19 01/20/20  Minna Merritts, MD  gabapentin (NEURONTIN) 100 MG capsule Start 1 capsule daily, increase by 1 cap every 2-3 days as tolerated up to 3 times a day, or may take 3 at once in evening. 09/08/19   Karamalegos, Devonne Doughty, DO  Multiple Vitamin (MULTIVITAMIN) tablet Take 1 tablet by mouth daily.    [provider]  naproxen (NAPROSYN) 500 MG tablet Take 1 tablet (500 mg total) by mouth 2 (two)  times daily with a meal. For 2-4 weeks then as needed 12/21/19   Karamalegos, Alexander J, DO  OFEV 150 MG CAPS TAKE 1 CAPSULE BY MOUTH TWICE DAILY WITH FOOD. CALL [phone] FOR REFILLS 12/13/19   Brand Males, MD  OXYGEN Inhale into the lungs as needed.    [provider]  Tiotropium Bromide Monohydrate (SPIRIVA RESPIMAT) 2.5 MCG/ACT AERS Inhale 2 puffs into the lungs daily. 09/22/19   Parrett, Fonnie Mu, NP    Allergies    Atorvastatin  Review of Systems   Review of Systems  Respiratory: Positive for shortness of breath.   All other systems reviewed and are negative.   Physical Exam Updated Vital Signs BP 111/78   Pulse 81   Temp 99.1 F (37.3 C) (Oral)   Resp (!) 23   SpO2 98%   Physical Exam Vitals and nursing note reviewed.  Constitutional:      General: He is not in acute distress.    Appearance: He is well-developed and normal weight.  HENT:     Head: Normocephalic and atraumatic.  Eyes:     Conjunctiva/sclera: Conjunctivae normal.     Pupils: Pupils are equal, round, and reactive to light.  Cardiovascular:     Rate and Rhythm: Normal rate and regular rhythm.     Pulses: Normal pulses.     Heart sounds: No murmur.  Pulmonary:     Effort: Tachypnea present. No accessory muscle usage or respiratory distress.     Breath sounds: Rales present. No wheezing.  Abdominal:     General: There is no distension.     Palpations: Abdomen is soft.     Tenderness: There is no abdominal tenderness. There is no guarding or rebound.  Musculoskeletal:        General: No tenderness. Normal range of motion.     Cervical back: Normal range of motion and neck supple.     Right lower leg: No tenderness.     Left lower leg: No tenderness.  Skin:    General: Skin is warm and dry.     Capillary Refill: Capillary refill takes less than 2 seconds.     Findings: No erythema or rash.  Neurological:     General: No focal deficit present.     Mental Status: He is alert. He is  disoriented.  Psychiatric:        Mood and Affect: Mood normal.        Behavior: Behavior normal.     ED Results / Procedures / Treatments   Labs (all labs ordered are listed, but only abnormal results are displayed) Labs Reviewed  BASIC METABOLIC PANEL - Abnormal; Notable for the following components:  Result Value   Glucose, Bld 142 (*)    BUN 34 (*)    All other components within normal limits  CBC - Abnormal; Notable for the following components:   WBC 19.5 (*)    Platelets 490 (*)    All other components within normal limits  TROPONIN I (HIGH SENSITIVITY)  TROPONIN I (HIGH SENSITIVITY)    EKG EKG Interpretation  Date/Time:  Wednesday January 19 2020 13:52:19 EDT Ventricular Rate:  99 PR Interval:  180 QRS Duration: 80 QT Interval:  310 QTC Calculation: 397 R Axis:   -95 Text Interpretation: Normal sinus rhythm Possible Left atrial enlargement Right superior axis deviation Pulmonary disease pattern Right ventricular hypertrophy with repolarization abnormality new Nonspecific T wave abnormality Confirmed by Blanchie Dessert [id number]) on 01/19/2020 3:25:42 PM   Radiology DG Chest Port 1 View  Result Date: 01/19/2020 CLINICAL DATA:  Shortness of breath, COVID-19 positive, pulmonary fibrosis EXAM: PORTABLE CHEST 1 VIEW COMPARISON:  09/08/2019 FINDINGS: Stable cardiomediastinal contours. Diffuse interstitial fibrotic lung changes with a basilar predominance no definite superimposed airspace opacity. Subtle area of nodularity in the left lung apex appears less prominent compared to the prior study. No pleural effusion or pneumothorax. IMPRESSION: Diffuse interstitial fibrotic lung changes without definite superimposed airspace opacity. Electronically Signed   By: Davina Poke D.O.   On: 01/19/2020 16:14    Procedures Procedures (including critical care time)  Medications Ordered in ED Medications  sodium chloride flush (NS) 0.9 % injection 3 mL (0 mLs Intravenous Hold  01/19/20 1531)    ED Course  I have reviewed the triage vital signs and the nursing notes.  Pertinent labs & imaging results that were available during my care of the patient were reviewed by me and considered in my medical decision making (see chart for details).    MDM Rules/Calculators/A&P                      64 year old male with multiple medical problems who is Covid positive presenting today due to increased oxygen requirements and now using 4 L constantly at home and dyspnea on exertion.  Patient is immunocompromised on Imuran due to his RA.  Also patient has pulmonary fibrosis and is getting evaluated for lung transplant later this year.  He is awake alert and able to provide all medical history.  He is satting 95 to 97% on 3 L.  Patient was sent in by his pulmonologist as they would like him to get remdesivir infusion.  Patient is currently on day 9 since he tested positive last Monday.  CBC consistent with leukocytosis of 19,000 with normal hemoglobin and platelet count, BMP with stable creatinine and normal anion gap, troponin is 15 and EKG has T wave abnormalities but prior EKG was from 20 years ago.  Chest x-ray is pending  4:34 PM CXR without new findings.  MDM Number of Diagnoses or Management Options   Amount and/or Complexity of Data Reviewed Clinical lab tests: ordered and reviewed Tests in the radiology section of CPT: ordered and reviewed Tests in the medicine section of CPT: ordered and reviewed Decide to obtain previous medical records or to obtain history from someone other than the patient: yes Obtain history from someone other than the patient: no Review and summarize past medical records: yes Discuss the patient with other providers: yes Independent visualization of images, tracings, or specimens: yes  Risk of Complications, Morbidity, and/or Mortality Presenting problems: moderate Diagnostic procedures: low Management options: low  Patient  Progress Patient progress: stable  Lawrence Wells was evaluated in Emergency Department on 01/19/2020 for the symptoms described in the history of present illness. He was evaluated in the context of the global COVID-19 pandemic, which necessitated consideration that the patient might be at risk for infection with the SARS-CoV-2 virus that causes COVID-19. Institutional protocols and algorithms that pertain to the evaluation of patients at risk for COVID-19 are in a state of rapid change based on information released by regulatory bodies including the CDC and federal and state organizations. These policies and algorithms were followed during the patient's care in the ED.   Final Clinical Impression(s) / ED Diagnoses Final diagnoses:  COVID-19  Hypoxia    Rx / DC Orders ED Discharge Orders    None       Blanchie Dessert, MD 01/19/20 1635

## 2020-01-19 NOTE — H&P (Signed)
History and Physical    Lawrence Wells [id number] DOB: 04-01-1956 DOA: 01/19/2020  PCP: Olin Hauser, DO   Patient coming from: Home    Chief Complaint: Shortness of breath, dyspnea on exertion, cough, loss of appetite  HPI: Lawrence Wells is a 65 y.o. male with medical history significant of pulmonary fibrosis, rheumatoid arthritis, COPD who was sent to the emergency department as per his pulmonologist advise for the management of Covid illness.  Patient was tested positive for Covid on 01/10/2020 at a clinic at [facility].  Patient uses oxygen intermittently at home for his pulmonary fibrosis at 2 L/min.  He was doing well before 2 days but since last 2 days he started having more shortness of breath, dyspnea on mild exertion, also developed productive cough and loss of appetite.  He also required 4 L of oxygen per minute which is more than his baseline.  He is feeling more lethargic and sleepy.  He lives with his wife. Patient has been vaccinated twice against Covid. He follows with pulmonology at [facility] for his pulmonary fibrosis.  He follows with Dr. Chase Caller.  He was advised by his pulmonologist to go to the emergency department for admission for remdesivir infusion.   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  He also has history of rheumatoid arthritis and is on Imuran.  Patient is also being considered for possible lung transplantation. Patient seen and examined at the bedside in the emergency department.  Currently requiring 3 - 4 L of oxygen per minute.  At rest he does not have any shortness of breath but he complains of chest tightness as if some body sitting on his chest.  He denies any fever, chills, nausea, vomiting, diarrhea, abdominal pain, dysuria or loss of consciousness. Patient is a past smoker and quit smoking in 2007.  ED Course: On presentation, he was hemodynamically stable.  He was found to have leukocytosis , he was afebrile.   Chest x-ray showed diffuse interstitial fibrotic lung changes without definite superimposed airspace opacity.  Inflammatory markers have been ordered.  Patient started on Decadron and remdesivir.  Review of Systems: As per HPI otherwise 10 point review of systems negative.    Past Medical History:  Diagnosis Date  . Arthritis   . COPD (chronic obstructive pulmonary disease) ([place])   . Coronary artery disease   . Dyspnea   . Emphysema lung ([place])   . Hyperlipidemia   . Pulmonary filariasis     Past Surgical History:  Procedure Laterality Date  . SINUS SURGERY WITH INSTATRAK       reports that he quit smoking about 14 years ago. His smoking use included cigarettes. He has a 45.00 pack-year smoking history. He quit smokeless tobacco use about 14 years ago.  His smokeless tobacco use included chew. He reports current alcohol use. He reports that he does not use drugs.  Allergies  Allergen Reactions  . Atorvastatin Other (See Comments)    Myalgias    Family History  Problem Relation Age of Onset  . Heart disease Mother   . Heart attack Mother 73  . Arthritis Mother   . Heart disease Father   . Heart attack Father 52  . Healthy Sister   . Hyperlipidemia Brother   . Throat cancer Brother 19  . Heart disease Brother   . Healthy Son   . Healthy Daughter      Prior to Admission medications   Medication Sig Start  Date End Date Taking? Authorizing Provider  azaTHIOprine (IMURAN) 50 MG tablet Take 2 tablets (100 mg total) by mouth daily. 12/23/19   Ofilia Neas, PA-C  diclofenac Sodium (VOLTAREN) 1 % GEL Apply 2 g topically 3 (three) times daily as needed. 07/19/19   Karamalegos, Devonne Doughty, DO  ezetimibe (ZETIA) 10 MG tablet Take 1 tablet (10 mg total) by mouth daily. 10/22/19 01/20/20  Minna Merritts, MD  gabapentin (NEURONTIN) 100 MG capsule Start 1 capsule daily, increase by 1 cap every 2-3 days as tolerated up to 3 times a day, or may take 3 at once in evening. 09/08/19    Karamalegos, Devonne Doughty, DO  Multiple Vitamin (MULTIVITAMIN) tablet Take 1 tablet by mouth daily.    [provider]  naproxen (NAPROSYN) 500 MG tablet Take 1 tablet (500 mg total) by mouth 2 (two) times daily with a meal. For 2-4 weeks then as needed 12/21/19   Karamalegos, Alexander J, DO  OFEV 150 MG CAPS TAKE 1 CAPSULE BY MOUTH TWICE DAILY WITH FOOD. CALL [phone] FOR REFILLS 12/13/19   Brand Males, MD  OXYGEN Inhale into the lungs as needed.    [provider]  Tiotropium Bromide Monohydrate (SPIRIVA RESPIMAT) 2.5 MCG/ACT AERS Inhale 2 puffs into the lungs daily. 09/22/19   Melvenia Needles, NP    Physical Exam: Vitals:   01/19/20 1530 01/19/20 1545 01/19/20 1615 01/19/20 1637  BP: 111/78 107/76 100/74 100/78  Pulse: 81  79 84  Resp: (!) 23   18  Temp:      TempSrc:      SpO2: 98%  97% 96%    Constitutional: Not in distress, pleasant gentleman Vitals:   01/19/20 1530 01/19/20 1545 01/19/20 1615 01/19/20 1637  BP: 111/78 107/76 100/74 100/78  Pulse: 81  79 84  Resp: (!) 23   18  Temp:      TempSrc:      SpO2: 98%  97% 96%   Eyes: PERRL, lids and conjunctivae normal ENMT: Mucous membranes are moist.  Neck: normal, supple, no masses, no thyromegaly Respiratory: Bilateral fine crackles. Normal respiratory effort. No accessory muscle use.  Cardiovascular: Regular rate and rhythm, no murmurs / rubs / gallops. No extremity edema.  Abdomen: no tenderness, no masses palpated. No hepatosplenomegaly. Bowel sounds positive.  Musculoskeletal: no clubbing / cyanosis. No joint deformity upper and lower extremities.  Skin: no rashes, lesions, ulcers. No induration Neurologic: CN 2-12 grossly intact.  Strength 5/5 in all 4.  Psychiatric: Normal judgment and insight. Alert and oriented x 3. Normal mood.   Foley Catheter:None  Labs on Admission: I have personally reviewed following labs and imaging studies  CBC: Recent Labs  Lab 01/19/20 1428  WBC 19.5*  HGB  14.9  HCT 45.7  MCV 97.4  PLT [id number]*   Basic Metabolic Panel: Recent Labs  Lab 01/19/20 1428  NA 136  K 4.9  CL 102  CO2 25  GLUCOSE 142*  BUN 34*  CREATININE 1.17  CALCIUM 9.5   GFR: CrCl cannot be calculated (Unknown ideal weight.). Liver Function Tests: No results for input(s): AST, ALT, ALKPHOS, BILITOT, PROT, ALBUMIN in the last 168 hours. No results for input(s): LIPASE, AMYLASE in the last 168 hours. No results for input(s): AMMONIA in the last 168 hours. Coagulation Profile: No results for input(s): INR, PROTIME in the last 168 hours. Cardiac Enzymes: No results for input(s): CKTOTAL, CKMB, CKMBINDEX, TROPONINI in the last 168 hours. BNP (last 3 results) No results  for input(s): PROBNP in the last 8760 hours. HbA1C: No results for input(s): HGBA1C in the last 72 hours. CBG: No results for input(s): GLUCAP in the last 168 hours. Lipid Profile: No results for input(s): CHOL, HDL, LDLCALC, TRIG, CHOLHDL, LDLDIRECT in the last 72 hours. Thyroid Function Tests: No results for input(s): TSH, T4TOTAL, FREET4, T3FREE, THYROIDAB in the last 72 hours. Anemia Panel: No results for input(s): VITAMINB12, FOLATE, FERRITIN, TIBC, IRON, RETICCTPCT in the last 72 hours. Urine analysis:    Component Value Date/Time   COLORURINE YELLOW 10/21/2019 1038   APPEARANCEUR CLEAR 10/21/2019 1038   LABSPEC 1.019 10/21/2019 1038   PHURINE < OR = 5.0 10/21/2019 1038   GLUCOSEU NEGATIVE 10/21/2019 1038   HGBUR NEGATIVE 10/21/2019 1038   KETONESUR NEGATIVE 10/21/2019 1038   PROTEINUR NEGATIVE 10/21/2019 1038   NITRITE NEGATIVE 10/21/2019 1038   LEUKOCYTESUR NEGATIVE 10/21/2019 1038    Radiological Exams on Admission: DG Chest Port 1 View  Result Date: 01/19/2020 CLINICAL DATA:  Shortness of breath, COVID-19 positive, pulmonary fibrosis EXAM: PORTABLE CHEST 1 VIEW COMPARISON:  09/08/2019 FINDINGS: Stable cardiomediastinal contours. Diffuse interstitial fibrotic lung changes with a  basilar predominance no definite superimposed airspace opacity. Subtle area of nodularity in the left lung apex appears less prominent compared to the prior study. No pleural effusion or pneumothorax. IMPRESSION: Diffuse interstitial fibrotic lung changes without definite superimposed airspace opacity. Electronically Signed   By: Davina Poke D.O.   On: 01/19/2020 16:14     Assessment/Plan Principal Problem:   Acute respiratory disease due to COVID-19 virus Active Problems:   Hypercholesterolemia   Pulmonary fibrosis (HCC)   Rheumatoid arthritis involving multiple sites with positive rheumatoid factor ([place])   COVID-19   Acute on chronic respiratory failure with hypoxia secondary to Covid illness: Presented with worsening hypoxia, dyspnea on exertion, cough, loss of appetite. Uses 2 L of oxygen intermittently at home for his pulmonary fibrosis.  Required 4 L of oxygen round-the-clock at home. Chest x-ray done in the emergency department does not show any clear infiltrates but is most likely obscured by finding of diffuse interstitial fibrosis from his chronic  pulmonary disease. Continue supplemental oxygen as needed.  Will start on Decadron and remdesivir. We will consider Actemra if hypoxia worsens. Continue to monitor inflammatory markers. Continue mucolytics, vitamin C, zinc.  Leukocytosis: Low suspicion for concurrent bacterial pneumonia.  Will check procalcitonin.  Will hold on starting any antibiotics.  Pulmonary fibrosis: Follows with Dr. Chase Caller.  On 2 L of oxygen per minute at home intermittently.  He is being considered for lung transplantation.  History of COPD: Past smoker.  Quit smoking in 2007.  Uses bronchodilators at home.  Rheumatoid arthritis: Follows with rheumatology.  On Imuran.  Hyperlipidemia: On Zetia at home.   Severity of Illness: The appropriate patient status for this patient is INPATIENT.   DVT prophylaxis: Lovenox( we will consider  Increasing   the dose as per D-dimers level which is pending) Code Status: Full Family Communication: Discussed with the patient Consults called: None     Shelly Coss MD [facility]  01/19/2020, 5:15 PM

## 2020-01-19 NOTE — ED Notes (Signed)
Pt remains on 2L O2 via nasal cannula

## 2020-01-19 NOTE — Telephone Encounter (Addendum)
Received a message from Derl Barrow, NP stating that pt was being sent to [facility] ED. Pt tested positive for covid 5/24 and after pt had televisit with Beth today 6/2, she stated that he needed to go to the ED for tx.  Called [facility] ED and spoke with Deloris, who then had me speak to RN at ED. When I was transferred to the ED RN, when tried to give him info about pt's name, DOB, or MRN, he did not want to take any of that info after I told him pt was en route after being sent there by NP. I tried to tell him that pt was covid positive, but all he said to me was that they would just see him when he arrives. Nothing further needed.

## 2020-01-19 NOTE — ED Triage Notes (Signed)
Pt arrives to ED with c/o of covid + since Tuesday: pt only developed sx 2 days ago having sob and CP. Pt has pulmonary fibrous.

## 2020-01-19 NOTE — Telephone Encounter (Signed)
Can you please call [facility] ED and let them know that we are sending a patient that is covid positive on 5/24. He has been fully vaccinated, received second covid vaccine on April 28th. He has a past medication history significant for ILD and rheumatoid arthritis. Increased O2 demand over the last 2 weeks. He is 94% on 4L. Needs admit to start remdesivir per Dr. Chase Caller and Dr. Joya Gaskins (with covid team).

## 2020-01-19 NOTE — Progress Notes (Signed)
Virtual Visit via Telephone Note  I connected with Lawrence Wells on 01/19/20 at  9:00 AM EDT by telephone and verified that I am speaking with the correct person using two identifiers.  Location: Patient: Home Provider: Office   I discussed the limitations, risks, security and privacy concerns of performing an evaluation and management service by telephone and the availability of in person appointments. I also discussed with the patient that there may be a patient responsible charge related to this service. The patient expressed understanding and agreed to proceed.   History of Present Illness: 64 year old male, former smoker quit in 2007 (45-pack-year history).  Past medical history significant for interstitial lung disease.  Patient of Dr. Chase Caller, last seen 01/06/2020. Works in Emergency planning/management officer for 30 years. 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 Started on Ofev 10/12/19. Rheumatology visit confirming diagnosis of rheumatoid arthritis on 10/21/2019.  He is on Imuran for RA. Undergoing pulmonary rehab and is using 3-4L of oxygen with exertion.   01/19/2020 Patient contacted today for televisit. Wife accompanied on phone call. During last visit patient weighted 159, reports currently weight 156.8. He stopped OFEV 2 weeks ago d/t diarrhea and weight loss. He will be seeing rheumatology this Friday and will ask about starting plaquenil for pain.   Interim: Tested positive for COVID on May 24th transplant clinic [facility]. He had a second positive test at a drive through clinic in [place] on Saturday 01/15/20. He was not previously experiencing symptoms except for increased oxygen demand and wife reports that he has been more lethargic and sleepy. Over the last two weeks he has been wearing oxygen at night and needing to use it pretty consistently during the day. He reports having some chest tightness when off oxygen in the morning. He has a chronic cough had is somewhat productive. He  reports increased dyspnea on exertion. Received second April 28th covid vaccine.  CXR: Extensive reticular opacities bilaterally within the peripheral mid and lower lung are consistent with known interstitial lung disease. Correlation with presumed outside CT suggested for further characterization.  Observations/Objective:  - Able to speak in full sentences, no over shortness of breath or wheezing  Patient reported vs : - O2 93% 4L - O2 79-85% RA   Assessment and Plan:  Covid-19 virus infection: - Tested positive for COVID on 01/10/20 at [facility]. Increased oxygen demand over the last two weeks with symptoms of cough and dyspnea worse over the last 24-48hours. - Requiring 4L oxygen to maintain O2 level >90%  - Discussed with Dr. Chase Caller and Dr. Joya Gaskins with Covid team, both recommending admission for Remdesivir. He did not qualify for monoclonal antibody infusion.   RA-ILD: - Stopped OFEV 2 weeks ago d/t diarrhea and weight loss (reassess at next visit) - Continues Imuran 100mg  daily  - Has apt with Rheumatology on Friday 01/21/20  Follow Up Instructions:   - Advised patient present to [facility] for admission for Remdesivir infusion   I discussed the assessment and treatment plan with the patient. The patient was provided an opportunity to ask questions and all were answered. The patient agreed with the plan and demonstrated an understanding of the instructions.   The patient was advised to call back or seek an in-person evaluation if the symptoms worsen or if the condition fails to improve as anticipated.  I provided 25 minutes of non-face-to-face time during this encounter.   Martyn Ehrich, NP

## 2020-01-19 NOTE — Telephone Encounter (Signed)
Called [facility] triage nurse and provided her with the patient's information.   Nothing further needed.

## 2020-01-20 ENCOUNTER — Inpatient Hospital Stay (HOSPITAL_COMMUNITY): Payer: PPO

## 2020-01-20 ENCOUNTER — Encounter (HOSPITAL_COMMUNITY): Payer: Self-pay | Admitting: Internal Medicine

## 2020-01-20 DIAGNOSIS — J8410 Pulmonary fibrosis, unspecified: Secondary | ICD-10-CM

## 2020-01-20 DIAGNOSIS — R0602 Shortness of breath: Secondary | ICD-10-CM

## 2020-01-20 DIAGNOSIS — E7800 Pure hypercholesterolemia, unspecified: Secondary | ICD-10-CM

## 2020-01-20 DIAGNOSIS — M0579 Rheumatoid arthritis with rheumatoid factor of multiple sites without organ or systems involvement: Secondary | ICD-10-CM

## 2020-01-20 DIAGNOSIS — R0902 Hypoxemia: Secondary | ICD-10-CM

## 2020-01-20 LAB — COMPREHENSIVE METABOLIC PANEL
ALT: 15 U/L (ref 0–44)
AST: 17 U/L (ref 15–41)
Albumin: 2.5 g/dL — ABNORMAL LOW (ref 3.5–5.0)
Alkaline Phosphatase: 103 U/L (ref 38–126)
Anion gap: 9 (ref 5–15)
BUN: 33 mg/dL — ABNORMAL HIGH (ref 8–23)
CO2: 26 mmol/L (ref 22–32)
Calcium: 9.1 mg/dL (ref 8.9–10.3)
Chloride: 103 mmol/L (ref 98–111)
Creatinine, Ser: 0.81 mg/dL (ref 0.61–1.24)
GFR calc Af Amer: >60 mL/min (ref >60)
GFR calc non Af Amer: >60 mL/min (ref >60)
Glucose, Bld: 130 mg/dL — ABNORMAL HIGH (ref 70–99)
Potassium: 5 mmol/L (ref 3.5–5.1)
Sodium: 138 mmol/L (ref 135–145)
Total Bilirubin: 0.5 mg/dL (ref 0.3–1.2)
Total Protein: 6.4 g/dL — ABNORMAL LOW (ref 6.5–8.1)

## 2020-01-20 LAB — CBC WITH DIFFERENTIAL/PLATELET
Abs Immature Granulocytes: 0.07 10*3/uL (ref 0.00–0.07)
Basophils Absolute: 0 10*3/uL (ref 0.0–0.1)
Basophils Relative: 0 %
Eosinophils Absolute: 0 10*3/uL (ref 0.0–0.5)
Eosinophils Relative: 0 %
HCT: 41.7 % (ref 39.0–52.0)
Hemoglobin: 14.3 g/dL (ref 13.0–17.0)
Immature Granulocytes: 1 %
Lymphocytes Relative: 21 %
Lymphs Abs: 2.7 10*3/uL (ref 0.7–4.0)
MCH: 32.6 pg (ref 26.0–34.0)
MCHC: 34.3 g/dL (ref 30.0–36.0)
MCV: 95 fL (ref 80.0–100.0)
Monocytes Absolute: 0.8 10*3/uL (ref 0.1–1.0)
Monocytes Relative: 6 %
Neutro Abs: 9.5 10*3/uL — ABNORMAL HIGH (ref 1.7–7.7)
Neutrophils Relative %: 72 %
Platelets: 427 10*3/uL — ABNORMAL HIGH (ref 150–400)
RBC: 4.39 MIL/uL (ref 4.22–5.81)
RDW: 12.9 % (ref 11.5–15.5)
WBC: 13.1 10*3/uL — ABNORMAL HIGH (ref 4.0–10.5)
nRBC: 0 % (ref 0.0–0.2)

## 2020-01-20 LAB — C-REACTIVE PROTEIN: CRP: 11.3 mg/dL — ABNORMAL HIGH (ref <1.0)

## 2020-01-20 LAB — D-DIMER, QUANTITATIVE: D-Dimer, Quant: 1.36 ug/mL-FEU — ABNORMAL HIGH (ref 0.00–0.50)

## 2020-01-20 LAB — ECHOCARDIOGRAM COMPLETE
Height: 71 in
Weight: 2525.59 oz

## 2020-01-20 LAB — FERRITIN: Ferritin: 229 ng/mL (ref 24–336)

## 2020-01-20 MED ORDER — METHYLPREDNISOLONE SODIUM SUCC 40 MG IJ SOLR
40.0000 mg | Freq: Four times a day (QID) | INTRAMUSCULAR | Status: DC
  Administered 2020-01-20 – 2020-01-21 (×4): 40 mg via INTRAVENOUS
  Filled 2020-01-20 (×4): qty 1

## 2020-01-20 MED ORDER — PRO-STAT SUGAR FREE PO LIQD
30.0000 mL | Freq: Two times a day (BID) | ORAL | Status: DC
  Administered 2020-01-20 – 2020-01-23 (×7): 30 mL via ORAL
  Filled 2020-01-20 (×7): qty 30

## 2020-01-20 MED ORDER — ENSURE ENLIVE PO LIQD
237.0000 mL | Freq: Two times a day (BID) | ORAL | Status: DC
  Administered 2020-01-20 – 2020-01-23 (×6): 237 mL via ORAL

## 2020-01-20 MED ORDER — FAMOTIDINE 20 MG PO TABS
20.0000 mg | ORAL_TABLET | Freq: Two times a day (BID) | ORAL | Status: DC
  Administered 2020-01-20 – 2020-01-23 (×6): 20 mg via ORAL
  Filled 2020-01-20 (×6): qty 1

## 2020-01-20 MED ORDER — AZATHIOPRINE 50 MG PO TABS
100.0000 mg | ORAL_TABLET | Freq: Every day | ORAL | Status: DC
  Administered 2020-01-20 – 2020-01-23 (×4): 100 mg via ORAL
  Filled 2020-01-20 (×4): qty 2

## 2020-01-20 MED ORDER — FAMOTIDINE 20 MG PO TABS: 20.0000 mg | ORAL_TABLET | Freq: Every day | ORAL | Status: DC

## 2020-01-20 MED ORDER — IOHEXOL 350 MG/ML SOLN
75.0000 mL | Freq: Once | INTRAVENOUS | Status: AC | PRN
  Administered 2020-01-20: 75 mL via INTRAVENOUS

## 2020-01-20 NOTE — Telephone Encounter (Signed)
Was involved in epic text message communication with Derl Barrow and Lazaro Arms yesterday.  They took a decision to send the patient to the hospital to get admitted yesterday.  We will close this encounter

## 2020-01-20 NOTE — Progress Notes (Signed)
  Echocardiogram 2D Echocardiogram has been performed.  Lawrence Wells 01/20/2020, 2:21 PM

## 2020-01-20 NOTE — Progress Notes (Signed)
PROGRESS NOTE                                                                                                                                                                                                             Patient Demographics:    Lawrence Wells, is a 64 y.o. male, DOB - 25-Dec-1955, [id number]  Admit date - 01/19/2020   Admitting Physician Shelly Coss, MD  Outpatient Primary MD for the patient is Olin Hauser, DO  LOS - 1   Chief Complaint  Patient presents with  . Shortness of Breath       Brief Narrative   HPI: Lawrence Wells is a 64 y.o. male with medical history significant of pulmonary fibrosis, rheumatoid arthritis, COPD who was sent to the emergency department as per his pulmonologist advise for the management of Covid illness.  Patient was tested positive for Covid on 01/10/2020 at a clinic at [facility].  Patient uses oxygen intermittently at home for his pulmonary fibrosis at 2 L/min.  He was doing well before 2 days but since last 2 days he started having more shortness of breath, dyspnea on mild exertion, also developed productive cough and loss of appetite.  He also required 4 L of oxygen per minute which is more than his baseline.  He is feeling more lethargic and sleepy.  He lives with his wife. Patient has been vaccinated twice against Covid. He follows with pulmonology at [facility] for his pulmonary fibrosis.  He follows with Dr. Chase Caller.  He was advised by his pulmonologist to go to the emergency department for admission for remdesivir infusion.   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  He also has history of rheumatoid arthritis and is on Imuran.  Patient is also being considered for possible lung transplantation. Patient seen and examined at the bedside in the emergency department.  Currently requiring 3 - 4 L of oxygen per minute.  At rest he does not have any shortness of breath  but he complains of chest tightness as if some body sitting on his chest.  He denies any fever, chills, nausea, vomiting, diarrhea, abdominal pain, dysuria or loss of consciousness. Patient is a past smoker and quit smoking in 2007.  ED Course: On presentation, he was hemodynamically stable.  He was found to have  leukocytosis , he was afebrile.  Chest x-ray showed diffuse interstitial fibrotic lung changes without definite superimposed airspace opacity.  Inflammatory markers have been ordered.  Patient started on Decadron and remdesivir.   Subjective:    [name] today has, No headache, No chest pain, No abdominal pain - No Nausea, reports some dyspnea, dry cough, no fever or chills .    Assessment  & Plan :    Principal Problem:   Acute respiratory disease due to COVID-19 virus Active Problems:   Hypercholesterolemia   Pulmonary fibrosis (HCC)   Rheumatoid arthritis involving multiple sites with positive rheumatoid factor ([place])   COVID-19  acute on chronic hypoxic respiratory failure with COVID-19 infection. -Patient presents with increased oxygen requirement, at baseline 2 L with activity, but he has been having 3 to 4 L even at rest recently. -He received Covid vaccine x2, but he tested positive for Covid on 5/24. -His hypoxia can be related to pulmonary fibrosis exacerbation versus COVID-19 infection. -This with Dr. Chase Caller, will obtain CT high-resolution to rule out pulmonary fibrosis flare, this to be followed by CTA chest PE rule out elevated D-dimers, as we will obtain 2D echo to rule out volume overload. -For now we will continue with IV IV steroids, will change IV Decadron to 40 mg of IV Solumedrol every 6 hours. -Was encouraged to get out of bed to chair, use incentive spirometry and flutter valve.   COVID-19 Labs  Recent Labs    01/19/20 1856 01/20/20 0556  DDIMER  --  1.36*  FERRITIN 255 229  CRP  --  11.3*    Lab Results  Component Value Date    SARSCOV2NAA NEGATIVE 10/07/2019   [place] NEGATIVE 09/15/2019   Pulmonary fibrosis:  Follows with Dr. Chase Caller.  On 2 L of oxygen per minute at home intermittently.  He is being considered for lung transplantation.  History of COPD:  Past smoker.  Quit smoking in 2007.  Uses bronchodilators at home.  Rheumatoid arthritis:  -Follows with rheumatology.  On Imuran.  Hyperlipidemia:  On Zetia at home.   Code Status : Full  Family Communication  : D/W patient  Disposition Plan  :  Status is: Inpatient  Remains inpatient appropriate because:IV treatments appropriate due to intensity of illness or inability to take PO and Inpatient level of care appropriate due to severity of illness   Dispo: The patient is from: Home              Anticipated d/c is to: Home              Anticipated d/c date is: > 3 days              Patient currently is not medically stable to d/c.       Consults  :  None  Procedures  : None  DVT Prophylaxis  :  [place] lovenox  Lab Results  Component Value Date   PLT 427 (H) 01/20/2020    Antibiotics  :    Anti-infectives (From admission, onward)   Start     Dose/Rate Route Frequency Ordered Stop   01/20/20 1000  remdesivir 100 mg in sodium chloride 0.9 % 100 mL IVPB     100 mg 200 mL/hr over 30 Minutes Intravenous Daily 01/19/20 1712 01/24/20 0959   01/19/20 1715  remdesivir 200 mg in sodium chloride 0.9% 250 mL IVPB     200 mg 580 mL/hr over 30 Minutes Intravenous Once 01/19/20 1712 01/19/20 2130  Objective:   Vitals:   01/19/20 2017 01/20/20 0000 01/20/20 0400 01/20/20 0808  BP: 100/68 94/66 99/70  113/69  Pulse: 86 75 68 71  Resp: 20 20 14 17   Temp: 98.7 F (37.1 C) 98.3 F (36.8 C) 97.9 F (36.6 C) 98 F (36.7 C)  TempSrc: Oral Oral Oral Oral  SpO2: 90% 92% 94% 94%  Weight:      Height:        Wt Readings from Last 3 Encounters:  01/19/20 71.6 kg  12/23/19 74.5 kg  12/02/19 74.3 kg     Intake/Output Summary  (Last 24 hours) at 01/20/2020 1157 Last data filed at 01/20/2020 0909 Gross per 24 hour  Intake 540 ml  Output 900 ml  Net -360 ml     Physical Exam  Awake Alert, Oriented X 3, No new F.N deficits, Normal affect Symmetrical Chest wall movement, Good air movement bilaterally, diffuse rhonchi RRR,No Gallops,Rubs or new Murmurs, No Parasternal Heave +ve B.Sounds, Abd Soft, No tenderness,No rebound - guarding or rigidity. No Cyanosis, Clubbing or edema, No new Rash or bruise      Data Review:    CBC Recent Labs  Lab 01/19/20 1428 01/20/20 0556  WBC 19.5* 13.1*  HGB 14.9 14.3  HCT 45.7 41.7  PLT 490* 427*  MCV 97.4 95.0  MCH 31.8 32.6  MCHC 32.6 34.3  RDW 13.2 12.9  LYMPHSABS  --  2.7  MONOABS  --  0.8  EOSABS  --  0.0  BASOSABS  --  0.0    Chemistries  Recent Labs  Lab 01/19/20 1428 01/20/20 0556  NA 136 138  K 4.9 5.0  CL 102 103  CO2 25 26  GLUCOSE 142* 130*  BUN 34* 33*  CREATININE 1.17 0.81  CALCIUM 9.5 9.1  AST  --  17  ALT  --  15  ALKPHOS  --  103  BILITOT  --  0.5   ------------------------------------------------------------------------------------------------------------------ No results for input(s): CHOL, HDL, LDLCALC, TRIG, CHOLHDL, LDLDIRECT in the last 72 hours.  Lab Results  Component Value Date   HGBA1C 5.7 (H) 09/02/2019   ------------------------------------------------------------------------------------------------------------------ No results for input(s): TSH, T4TOTAL, T3FREE, THYROIDAB in the last 72 hours.  Invalid input(s): FREET3 ------------------------------------------------------------------------------------------------------------------ Recent Labs    01/19/20 1856 01/20/20 0556  FERRITIN 255 229    Coagulation profile No results for input(s): INR, PROTIME in the last 168 hours.  Recent Labs    01/20/20 0556  DDIMER 1.36*    Cardiac Enzymes No results for input(s): CKMB, TROPONINI, MYOGLOBIN in the last  168 hours.  Invalid input(s): CK ------------------------------------------------------------------------------------------------------------------ No results found for: BNP  Inpatient Medications  Scheduled Meds: . vitamin C  500 mg Oral Daily  . azaTHIOprine  100 mg Oral Daily  . dexamethasone (DECADRON) injection  6 mg Intravenous Q24H  . enoxaparin (LOVENOX) injection  40 mg Subcutaneous Q24H  . ezetimibe  10 mg Oral Daily  . famotidine  20 mg Oral Daily  . feeding supplement (ENSURE ENLIVE)  237 mL Oral BID BM  . feeding supplement (PRO-STAT SUGAR FREE 64)  30 mL Oral BID  . Ipratropium-Albuterol  1 puff Inhalation Q6H  . sodium chloride flush  3 mL Intravenous Once  . zinc sulfate  220 mg Oral Daily   Continuous Infusions: . remdesivir 100 mg in NS 100 mL 100 mg (01/20/20 0909)   PRN Meds:.guaiFENesin-dextromethorphan, polyethylene glycol  Micro Results No results found for this or any previous visit (from the past 240 hour(s)).  Radiology Reports DG Chest Port 1 View  Result Date: 01/19/2020 CLINICAL DATA:  Shortness of breath, COVID-19 positive, pulmonary fibrosis EXAM: PORTABLE CHEST 1 VIEW COMPARISON:  09/08/2019 FINDINGS: Stable cardiomediastinal contours. Diffuse interstitial fibrotic lung changes with a basilar predominance no definite superimposed airspace opacity. Subtle area of nodularity in the left lung apex appears less prominent compared to the prior study. No pleural effusion or pneumothorax. IMPRESSION: Diffuse interstitial fibrotic lung changes without definite superimposed airspace opacity. Electronically Signed   By: Davina Poke D.O.   On: 01/19/2020 16:14     Phillips Climes M.D on 01/20/2020 at 11:57 AM    [facility] -  Office  [phone]

## 2020-01-21 ENCOUNTER — Other Ambulatory Visit: Payer: Self-pay

## 2020-01-21 DIAGNOSIS — I4891 Unspecified atrial fibrillation: Secondary | ICD-10-CM

## 2020-01-21 LAB — CBC WITH DIFFERENTIAL/PLATELET
Abs Immature Granulocytes: 0.11 10*3/uL — ABNORMAL HIGH (ref 0.00–0.07)
Basophils Absolute: 0 10*3/uL (ref 0.0–0.1)
Basophils Relative: 0 %
Eosinophils Absolute: 0 10*3/uL (ref 0.0–0.5)
Eosinophils Relative: 0 %
HCT: 45.7 % (ref 39.0–52.0)
Hemoglobin: 15.3 g/dL (ref 13.0–17.0)
Immature Granulocytes: 1 %
Lymphocytes Relative: 16 %
Lymphs Abs: 2.3 10*3/uL (ref 0.7–4.0)
MCH: 32.1 pg (ref 26.0–34.0)
MCHC: 33.5 g/dL (ref 30.0–36.0)
MCV: 95.8 fL (ref 80.0–100.0)
Monocytes Absolute: 0.5 10*3/uL (ref 0.1–1.0)
Monocytes Relative: 4 %
Neutro Abs: 11.5 10*3/uL — ABNORMAL HIGH (ref 1.7–7.7)
Neutrophils Relative %: 79 %
Platelets: 508 10*3/uL — ABNORMAL HIGH (ref 150–400)
RBC: 4.77 MIL/uL (ref 4.22–5.81)
RDW: 13.1 % (ref 11.5–15.5)
WBC: 14.4 10*3/uL — ABNORMAL HIGH (ref 4.0–10.5)
nRBC: 0 % (ref 0.0–0.2)

## 2020-01-21 LAB — COMPREHENSIVE METABOLIC PANEL
ALT: 17 U/L (ref 0–44)
AST: 21 U/L (ref 15–41)
Albumin: 2.8 g/dL — ABNORMAL LOW (ref 3.5–5.0)
Alkaline Phosphatase: 110 U/L (ref 38–126)
Anion gap: 13 (ref 5–15)
BUN: 30 mg/dL — ABNORMAL HIGH (ref 8–23)
CO2: 26 mmol/L (ref 22–32)
Calcium: 9.4 mg/dL (ref 8.9–10.3)
Chloride: 102 mmol/L (ref 98–111)
Creatinine, Ser: 0.79 mg/dL (ref 0.61–1.24)
GFR calc Af Amer: >60 mL/min (ref >60)
GFR calc non Af Amer: >60 mL/min (ref >60)
Glucose, Bld: 137 mg/dL — ABNORMAL HIGH (ref 70–99)
Potassium: 4.3 mmol/L (ref 3.5–5.1)
Sodium: 141 mmol/L (ref 135–145)
Total Bilirubin: 0.5 mg/dL (ref 0.3–1.2)
Total Protein: 7.1 g/dL (ref 6.5–8.1)

## 2020-01-21 LAB — FERRITIN: Ferritin: 289 ng/mL (ref 24–336)

## 2020-01-21 LAB — BRAIN NATRIURETIC PEPTIDE: B Natriuretic Peptide: 271.4 pg/mL — ABNORMAL HIGH (ref 0.0–100.0)

## 2020-01-21 LAB — D-DIMER, QUANTITATIVE: D-Dimer, Quant: 1.01 ug/mL-FEU — ABNORMAL HIGH (ref 0.00–0.50)

## 2020-01-21 LAB — C-REACTIVE PROTEIN: CRP: 6 mg/dL — ABNORMAL HIGH (ref <1.0)

## 2020-01-21 MED ORDER — METHYLPREDNISOLONE SODIUM SUCC 125 MG IJ SOLR
60.0000 mg | Freq: Four times a day (QID) | INTRAMUSCULAR | Status: DC
  Administered 2020-01-21 – 2020-01-23 (×8): 60 mg via INTRAVENOUS
  Filled 2020-01-21: qty 2
  Filled 2020-01-21: qty 0.96
  Filled 2020-01-21: qty 2
  Filled 2020-01-21: qty 0.96
  Filled 2020-01-21 (×3): qty 2

## 2020-01-21 MED ORDER — APIXABAN 5 MG PO TABS
5.0000 mg | ORAL_TABLET | Freq: Two times a day (BID) | ORAL | Status: DC
  Administered 2020-01-21 – 2020-01-23 (×5): 5 mg via ORAL
  Filled 2020-01-21 (×5): qty 1

## 2020-01-21 MED ORDER — FUROSEMIDE 10 MG/ML IJ SOLN
20.0000 mg | Freq: Once | INTRAMUSCULAR | Status: AC
  Administered 2020-01-21: 20 mg via INTRAVENOUS
  Filled 2020-01-21: qty 2

## 2020-01-21 NOTE — Discharge Instructions (Signed)

## 2020-01-21 NOTE — Progress Notes (Signed)
PROGRESS NOTE                                                                                                                                                                                                             Patient Demographics:    Lawrence Wells, is a 64 y.o. male, DOB - April 12, 1956, XBM:[id number]  Admit date - 01/19/2020   Admitting Physician Shelly Coss, MD  Outpatient Primary MD for the patient is Parks Ranger Devonne Doughty, DO  LOS - 2   Chief Complaint  Patient presents with  . Shortness of Breath       Brief Narrative   DRAXTON LUU is a 64 y.o. male with medical history significant of pulmonary fibrosis, rheumatoid arthritis, COPD who was sent to the emergency department as per his pulmonologist advise for the management of Covid illness.  Patient was tested positive for Covid on 01/10/2020 at a clinic at [facility].  Patient presents to ED with increased requirement of oxygen, and hypoxia, patient on 2 L with activity at baseline, he is currently requiring 3 to 4 L with activity, so he was admitted to further work-up, his worsening hypoxia appears to be pulmonary fibrosis exacerbation in the setting of COVID-19 infection, he was admitted for further work-up.   Subjective:    [name] today has, No headache, No chest pain, No abdominal pain - No Nausea, reports he is feeling better today, dyspnea has improved, still reports some cough, no fever or chills.  .   Assessment  & Plan :    Principal Problem:   Acute respiratory disease due to COVID-19 virus Active Problems:   Hypercholesterolemia   Pulmonary fibrosis (HCC)   Rheumatoid arthritis involving multiple sites with positive rheumatoid factor ([place])   COVID-19  acute on chronic hypoxic respiratory failure with COVID-19 infection. -Patient presents with increased oxygen requirement, at baseline 2 L with activity, but he has been having 3 to 4 L even at rest recently. -He  received Covid vaccine x2, but he tested positive for Covid on 5/24. -Have discussed with pulmonary Lawrence Wells, he did review high-resolution CT, there is some degree of increased groundglass opacity, so his worsening hypoxia most likely related to pulmonary fibrosis exacerbation, which is provoked by his COVID-19 infection . -We will continue to treat on IV steroids for both pulmonary fibrosis  exacerbation and COVID-19 infection, IV Solu-Medrol will be increased to 60 mg IV every 6 hours, which is more appropriate for pulmonary fibrosis exacerbation, for next 48 hours, then it can be changed to prednisone 60 mg oral daily, with taper over 3 weeks . -Continue with IV remdesivir. -Procalcitonin within normal limits, no indication for antibiotics. -Was encouraged to get out of bed to chair, use incentive spirometry and flutter valve.  Chronic diastolic CHF -2D echo was done, grade 2 diastolic dysfunction with preserved EF, patient BNP mildly elevated, but he does appear to be in acute CHF or volume overload, I will go ahead and give one-time of IV Lasix 20 mg once to see if this helps with his hypoxia.   COVID-19 Labs  Recent Labs    01/19/20 1856 01/20/20 0556 01/21/20 0818  DDIMER  --  1.36* 1.01*  FERRITIN 255 229 289  CRP  --  11.3* 6.0*    Lab Results  Component Value Date   SARSCOV2NAA NEGATIVE 10/07/2019   [place] NEGATIVE 09/15/2019    New onset A. Fib -We will check TSH, appears to be rate controlled, new onset, CHA2DS2-VASc score of 2, will discuss with cardiology about full anticoagulation with Eliquis.  Pulmonary fibrosis exacerbation : -Cussed with Lawrence Wells, please see above discussion .  New onset atrial fibrillation  History of COPD:  Past smoker.  Quit smoking in 2007.  Uses bronchodilators at home.  Rheumatoid arthritis:  -Follows with rheumatology.  On Imuran.  Hyperlipidemia:  On Zetia at home.   Code Status : Full  Family  Communication  : D/W patient  Disposition Plan  :  Status is: Inpatient  Remains inpatient appropriate because:IV treatments appropriate due to intensity of illness or inability to take PO and Inpatient level of care appropriate due to severity of illness   Dispo: The patient is from: Home              Anticipated d/c is to: Home              Anticipated d/c date is: > 3 days              Patient currently is not medically stable to d/c.       Consults  :  None  Procedures  : None  DVT Prophylaxis  :  [place] lovenox  Lab Results  Component Value Date   PLT 508 (H) 01/21/2020    Antibiotics  :    Anti-infectives (From admission, onward)   Start     Dose/Rate Route Frequency Ordered Stop   01/20/20 1000  remdesivir 100 mg in sodium chloride 0.9 % 100 mL IVPB     100 mg 200 mL/hr over 30 Minutes Intravenous Daily 01/19/20 1712 01/24/20 0959   01/19/20 1715  remdesivir 200 mg in sodium chloride 0.9% 250 mL IVPB     200 mg 580 mL/hr over 30 Minutes Intravenous Once 01/19/20 1712 01/19/20 2130        Objective:   Vitals:   01/20/20 0808 01/20/20 1237 01/20/20 1719 01/21/20 0509  BP: 113/69 114/77 101/73 111/71  Pulse: 71 83 72 (!) 104  Resp: 17 19 20 20   Temp: 98 F (36.7 C) 97.8 F (36.6 C) 97.7 F (36.5 C) 97.8 F (36.6 C)  TempSrc: Oral Oral Oral Oral  SpO2: 94% 93% 93% (!) 85%  Weight:      Height:        Wt Readings from Last 3 Encounters:  01/19/20 71.6 kg  12/23/19 74.5 kg  12/02/19 74.3 kg     Intake/Output Summary (Last 24 hours) at 01/21/2020 1055 Last data filed at 01/21/2020 0800 Gross per 24 hour  Intake 910 ml  Output --  Net 910 ml     Physical Exam  Awake Alert, Oriented X 3, No new F.N deficits, Normal affect Symmetrical Chest wall movement, Good air movement bilaterally, diffuse rhonchi Irregular irregular,No Gallops,Rubs or new Murmurs, No Parasternal Heave +ve B.Sounds, Abd Soft, No tenderness, No rebound - guarding or  rigidity. No Cyanosis, Clubbing or edema, No new Rash or bruise      Data Review:    CBC Recent Labs  Lab 01/19/20 1428 01/20/20 0556 01/21/20 0818  WBC 19.5* 13.1* 14.4*  HGB 14.9 14.3 15.3  HCT 45.7 41.7 45.7  PLT 490* 427* 508*  MCV 97.4 95.0 95.8  MCH 31.8 32.6 32.1  MCHC 32.6 34.3 33.5  RDW 13.2 12.9 13.1  LYMPHSABS  --  2.7 2.3  MONOABS  --  0.8 0.5  EOSABS  --  0.0 0.0  BASOSABS  --  0.0 0.0    Chemistries  Recent Labs  Lab 01/19/20 1428 01/20/20 0556 01/21/20 0818  NA 136 138 141  K 4.9 5.0 4.3  CL 102 103 102  CO2 25 26 26   GLUCOSE 142* 130* 137*  BUN 34* 33* 30*  CREATININE 1.17 0.81 0.79  CALCIUM 9.5 9.1 9.4  AST  --  17 21  ALT  --  15 17  ALKPHOS  --  103 110  BILITOT  --  0.5 0.5   ------------------------------------------------------------------------------------------------------------------ No results for input(s): CHOL, HDL, LDLCALC, TRIG, CHOLHDL, LDLDIRECT in the last 72 hours.  Lab Results  Component Value Date   HGBA1C 5.7 (H) 09/02/2019   ------------------------------------------------------------------------------------------------------------------ No results for input(s): TSH, T4TOTAL, T3FREE, THYROIDAB in the last 72 hours.  Invalid input(s): FREET3 ------------------------------------------------------------------------------------------------------------------ Recent Labs    01/20/20 0556 01/21/20 0818  FERRITIN 229 289    Coagulation profile No results for input(s): INR, PROTIME in the last 168 hours.  Recent Labs    01/20/20 0556 01/21/20 0818  DDIMER 1.36* 1.01*    Cardiac Enzymes No results for input(s): CKMB, TROPONINI, MYOGLOBIN in the last 168 hours.  Invalid input(s): CK ------------------------------------------------------------------------------------------------------------------    Component Value Date/Time   BNP 271.4 (H) 01/21/2020 0818    Inpatient Medications  Scheduled Meds: .  vitamin C  500 mg Oral Daily  . azaTHIOprine  100 mg Oral Daily  . enoxaparin (LOVENOX) injection  40 mg Subcutaneous Q24H  . ezetimibe  10 mg Oral Daily  . famotidine  20 mg Oral BID  . feeding supplement (ENSURE ENLIVE)  237 mL Oral BID BM  . feeding supplement (PRO-STAT SUGAR FREE 64)  30 mL Oral BID  . Ipratropium-Albuterol  1 puff Inhalation Q6H  . methylPREDNISolone (SOLU-MEDROL) injection  60 mg Intravenous Q6H  . sodium chloride flush  3 mL Intravenous Once  . zinc sulfate  220 mg Oral Daily   Continuous Infusions: . remdesivir 100 mg in NS 100 mL 100 mg (01/21/20 0944)   PRN Meds:.guaiFENesin-dextromethorphan, polyethylene glycol  Micro Results No results found for this or any previous visit (from the past 240 hour(s)).  Radiology Reports CT ANGIO CHEST PE W OR WO CONTRAST  Result Date: 01/20/2020 CLINICAL DATA:  64 year old male with hypoxia and COVID-19. Rule out pulmonary embolism. EXAM: CT ANGIOGRAPHY CHEST WITH CONTRAST TECHNIQUE: Multidetector CT imaging of the chest was  performed using the standard protocol during bolus administration of intravenous contrast. Multiplanar CT image reconstructions and MIPs were obtained to evaluate the vascular anatomy. CONTRAST:  34mL OMNIPAQUE IOHEXOL 350 MG/ML SOLN COMPARISON:  Chest radiograph dated 01/19/2020 and CT dated 09/14/2019. FINDINGS: Cardiovascular: There is mild cardiomegaly. No pericardial effusion. Coronary vascular calcifications primarily involving the LAD and RCA. The thoracic aorta is unremarkable. Mild dilatation of the main pulmonary trunk suggestive of a degree of pulmonary hypertension. Evaluation of the pulmonary arteries is somewhat limited due to respiratory motion artifact. No pulmonary artery embolus identified. Mediastinum/Nodes: Several top-normal bilateral hilar and mediastinal lymph nodes. These measure up to approximately 11 mm in short axis in the right hilum 13 mm in the subcarinal region and 10 mm in the  aortopulmonic window relatively similar to prior CT. The esophagus is grossly unremarkable. No mediastinal fluid collection. Lungs/Pleura: There is background of emphysema and interstitial lung disease. There is subpleural reticulation and honeycombing most severely involving the left lung base. There is traction bronchiectasis. Diffuse ground-glass density throughout the lungs with confluent densities involving the lower lung field may represent superimposed pneumonia. Right apical subpleural scarring. No lobar consolidation, pleural effusion, or pneumothorax. The central airways are patent. Upper Abdomen: No acute abnormality. Musculoskeletal: No chest wall abnormality. No acute or significant osseous findings. Review of the MIP images confirms the above findings. IMPRESSION: 1. No CT evidence of pulmonary artery embolus. 2. Emphysema and interstitial lung disease. 3. Diffuse airspace ground-glass opacity and confluent areas of densities primarily involving the lower lung field concerning for superimposed pneumonia. Clinical correlation and follow-up recommended. Please refer to the accompanying high-resolution CT report for detail pleuroparenchymal findings. 4. Aortic Atherosclerosis (ICD10-I70.0) and Emphysema (ICD10-J43.9). Electronically Signed   By: Anner Crete M.D.   On: 01/20/2020 23:42   CT Chest High Resolution  Result Date: 01/21/2020 CLINICAL DATA:  64 year old male with history of hypoxia and COVID-19. EXAM: CT CHEST WITHOUT CONTRAST TECHNIQUE: Multidetector CT imaging of the chest was performed following the standard protocol without intravenous contrast. High resolution imaging of the lungs, as well as inspiratory and expiratory imaging, was performed. COMPARISON:  Chest CT 09/14/2019. FINDINGS: Comment: Study is limited by non standard protocol. Cardiovascular: Heart size is mildly enlarged with dilatation of the right atrium and right ventricle. There is no significant pericardial fluid,  thickening or pericardial calcification. There is aortic atherosclerosis, as well as atherosclerosis of the great vessels of the mediastinum and the coronary arteries, including calcified atherosclerotic plaque in the left main, left anterior descending and right coronary arteries. Dilatation of the pulmonic trunk (4.0 cm in diameter). Mediastinum/Nodes: Multiple prominent but nonenlarged mediastinal and bilateral hilar lymph nodes, similar to the prior study, presumably chronic and reactive in the setting of interstitial lung disease. Esophagus is unremarkable in appearance. No axillary lymphadenopathy. Lungs/Pleura: Standard high-resolution protocol was not performed. With this limitation in mind, the limited high-resolution images clearly demonstrate widespread areas of septal thickening, subpleural reticulation, parenchymal banding, traction bronchiectasis and honeycombing in the lungs bilaterally (left greater than right). Findings again have a craniocaudal gradient and do not appear progressive compared to the prior study from September 14, 2019. Inspiratory and expiratory imaging was performed and is unremarkable. Diffuse bronchial wall thickening with moderate centrilobular and paraseptal emphysema. No acute consolidative airspace disease. No pleural effusions. No definite suspicious appearing pulmonary nodules or masses are noted. Chronic partially calcified pleuroparenchymal thickening and architectural distortion in the lung apices bilaterally (right greater than left), similar to the prior study,  most compatible with chronic post infectious or inflammatory scarring. Upper Abdomen: Aortic atherosclerosis. Musculoskeletal: There are no aggressive appearing lytic or blastic lesions noted in the visualized portions of the skeleton. IMPRESSION: 1. The appearance of the lungs is very similar to the prior study with a spectrum of findings considered diagnostic of usual interstitial pneumonia (UIP) per current ATS  guidelines. 2. Diffuse bronchial wall thickening with moderate centrilobular and paraseptal emphysema; imaging findings suggestive of underlying COPD. 3. Cardiomegaly with dilatation of the right atrium and right ventricle. There is also dilatation of the pulmonic trunk (4.0 cm in diameter), concerning for pulmonary arterial hypertension. 4. Aortic atherosclerosis, in addition to left main and 2 vessel coronary artery disease. Please note that although the presence of coronary artery calcium documents the presence of coronary artery disease, the severity of this disease and any potential stenosis cannot be assessed on this non-gated CT examination. Assessment for potential risk factor modification, dietary therapy or pharmacologic therapy may be warranted, if clinically indicated. Aortic Atherosclerosis (ICD10-I70.0) and Emphysema (ICD10-J43.9). Electronically Signed   By: Vinnie Langton M.D.   On: 01/21/2020 08:16   DG Chest Port 1 View  Result Date: 01/19/2020 CLINICAL DATA:  Shortness of breath, COVID-19 positive, pulmonary fibrosis EXAM: PORTABLE CHEST 1 VIEW COMPARISON:  09/08/2019 FINDINGS: Stable cardiomediastinal contours. Diffuse interstitial fibrotic lung changes with a basilar predominance no definite superimposed airspace opacity. Subtle area of nodularity in the left lung apex appears less prominent compared to the prior study. No pleural effusion or pneumothorax. IMPRESSION: Diffuse interstitial fibrotic lung changes without definite superimposed airspace opacity. Electronically Signed   By: Davina Poke D.O.   On: 01/19/2020 16:14   ECHOCARDIOGRAM COMPLETE  Result Date: 01/20/2020    ECHOCARDIOGRAM REPORT   Patient Name:   CHIP CANEPA Date of Exam: 01/20/2020 Medical Rec #:  [id number]      Height:       71.0 in Accession #:    [id number]     Weight:       157.8 lb Date of Birth:  [birth year]      BSA:          1.907 m Patient Age:    15 years       BP:           114/77 mmHg Patient Gender:  M              HR:           83 bpm. Exam Location:  Inpatient Procedure: 2D Echo Indications:    dyspnea  History:        Patient has no prior history of Echocardiogram examinations.                 CAD, COPD; Risk Factors:Dyslipidemia and Former Smoker.  Sonographer:    Jannett Celestine RDCS (AE) Referring Phys: [id number] Abisai Deer S Rosebud Koenen IMPRESSIONS  1. Left ventricular ejection fraction, by estimation, is 60 to 65%. The left ventricle has normal function. The left ventricle has no regional wall motion abnormalities. Left ventricular diastolic parameters are consistent with Grade II diastolic dysfunction (pseudonormalization). There is the interventricular septum is flattened in diastole ('D' shaped left ventricle), consistent with right ventricular volume overload.  2. Right ventricular systolic function is moderately reduced. The right ventricular size is normal.  3. The mitral valve is normal in structure. No evidence of mitral valve regurgitation. No evidence of mitral stenosis.  4. The aortic valve is normal in structure. Aortic valve  regurgitation is not visualized. No aortic stenosis is present. FINDINGS  Left Ventricle: Left ventricular ejection fraction, by estimation, is 60 to 65%. The left ventricle has normal function. The left ventricle has no regional wall motion abnormalities. The left ventricular internal cavity size was normal in size. There is  no left ventricular hypertrophy. The interventricular septum is flattened in diastole ('D' shaped left ventricle), consistent with right ventricular volume overload. Left ventricular diastolic parameters are consistent with Grade II diastolic dysfunction (pseudonormalization). Right Ventricle: The right ventricular size is normal. No increase in right ventricular wall thickness. Right ventricular systolic function is moderately reduced. Left Atrium: Left atrial size was normal in size. Right Atrium: Right atrial size was normal in size. Pericardium: There is no  evidence of pericardial effusion. Mitral Valve: The mitral valve is normal in structure. No evidence of mitral valve regurgitation. No evidence of mitral valve stenosis. Tricuspid Valve: The tricuspid valve is normal in structure. Tricuspid valve regurgitation is trivial. No evidence of tricuspid stenosis. Aortic Valve: The aortic valve is normal in structure. Aortic valve regurgitation is not visualized. No aortic stenosis is present. Pulmonic Valve: The pulmonic valve was normal in structure. Pulmonic valve regurgitation is not visualized. No evidence of pulmonic stenosis. Aorta: The aortic root and ascending aorta are structurally normal, with no evidence of dilitation. IAS/Shunts: The atrial septum is grossly normal.  LEFT VENTRICLE PLAX 2D LVIDd:         4.15 cm  Diastology LVIDs:         2.80 cm  LV e' lateral:   11.25 cm/s LV PW:         1.25 cm  LV E/e' lateral: 4.9 LV IVS:        1.30 cm  LV e' medial:    5.33 cm/s LVOT diam:     2.30 cm  LV E/e' medial:  10.3 LV SV:         43 LV SV Index:   22 LVOT Area:     4.15 cm  LEFT ATRIUM           Index       RIGHT ATRIUM           Index LA diam:      4.30 cm 2.26 cm/m  RA Area:     27.40 cm LA Vol (A4C): 46.6 ml 24.44 ml/m RA Volume:   101.00 ml 52.97 ml/m  AORTIC VALVE LVOT Vmax:   67.20 cm/s LVOT Vmean:  54.800 cm/s LVOT VTI:    0.103 m  AORTA Ao Root diam: 3.50 cm MITRAL VALVE MV Area (PHT): 3.77 cm    SHUNTS MV Decel Time: 201 msec    Systemic VTI:  0.10 m MV E velocity: 54.80 cm/s  Systemic Diam: 2.30 cm MV A velocity: 45.80 cm/s MV E/A ratio:  1.20 Mertie Moores MD Electronically signed by Mertie Moores MD Signature Date/Time: 01/20/2020/3:16:40 PM    Final      Phillips Climes M.D on 01/21/2020 at 10:55 AM    [facility] -  Office  [phone]

## 2020-01-22 LAB — COMPREHENSIVE METABOLIC PANEL
ALT: 21 U/L (ref 0–44)
AST: 20 U/L (ref 15–41)
Albumin: 2.6 g/dL — ABNORMAL LOW (ref 3.5–5.0)
Alkaline Phosphatase: 91 U/L (ref 38–126)
Anion gap: 9 (ref 5–15)
BUN: 39 mg/dL — ABNORMAL HIGH (ref 8–23)
CO2: 29 mmol/L (ref 22–32)
Calcium: 9.2 mg/dL (ref 8.9–10.3)
Chloride: 102 mmol/L (ref 98–111)
Creatinine, Ser: 0.85 mg/dL (ref 0.61–1.24)
GFR calc Af Amer: >60 mL/min (ref >60)
GFR calc non Af Amer: >60 mL/min (ref >60)
Glucose, Bld: 153 mg/dL — ABNORMAL HIGH (ref 70–99)
Potassium: 4.9 mmol/L (ref 3.5–5.1)
Sodium: 140 mmol/L (ref 135–145)
Total Bilirubin: 0.5 mg/dL (ref 0.3–1.2)
Total Protein: 6.3 g/dL — ABNORMAL LOW (ref 6.5–8.1)

## 2020-01-22 LAB — CBC WITH DIFFERENTIAL/PLATELET
Abs Immature Granulocytes: 0.16 10*3/uL — ABNORMAL HIGH (ref 0.00–0.07)
Basophils Absolute: 0 10*3/uL (ref 0.0–0.1)
Basophils Relative: 0 %
Eosinophils Absolute: 0 10*3/uL (ref 0.0–0.5)
Eosinophils Relative: 0 %
HCT: 42.7 % (ref 39.0–52.0)
Hemoglobin: 14.1 g/dL (ref 13.0–17.0)
Immature Granulocytes: 1 %
Lymphocytes Relative: 12 %
Lymphs Abs: 2.4 10*3/uL (ref 0.7–4.0)
MCH: 32.1 pg (ref 26.0–34.0)
MCHC: 33 g/dL (ref 30.0–36.0)
MCV: 97.3 fL (ref 80.0–100.0)
Monocytes Absolute: 0.7 10*3/uL (ref 0.1–1.0)
Monocytes Relative: 3 %
Neutro Abs: 17.4 10*3/uL — ABNORMAL HIGH (ref 1.7–7.7)
Neutrophils Relative %: 84 %
Platelets: 558 10*3/uL — ABNORMAL HIGH (ref 150–400)
RBC: 4.39 MIL/uL (ref 4.22–5.81)
RDW: 13 % (ref 11.5–15.5)
WBC: 20.7 10*3/uL — ABNORMAL HIGH (ref 4.0–10.5)
nRBC: 0 % (ref 0.0–0.2)

## 2020-01-22 LAB — D-DIMER, QUANTITATIVE: D-Dimer, Quant: 0.7 ug/mL-FEU — ABNORMAL HIGH (ref 0.00–0.50)

## 2020-01-22 LAB — C-REACTIVE PROTEIN: CRP: 2.3 mg/dL — ABNORMAL HIGH (ref <1.0)

## 2020-01-22 LAB — FERRITIN: Ferritin: 278 ng/mL (ref 24–336)

## 2020-01-22 MED ORDER — FUROSEMIDE 20 MG PO TABS
20.0000 mg | ORAL_TABLET | Freq: Every day | ORAL | Status: DC
  Administered 2020-01-22 – 2020-01-23 (×2): 20 mg via ORAL
  Filled 2020-01-22 (×2): qty 1

## 2020-01-22 MED ORDER — FUROSEMIDE 10 MG/ML IJ SOLN
INTRAMUSCULAR | Status: AC
  Filled 2020-01-22: qty 2

## 2020-01-22 NOTE — Progress Notes (Signed)
PROGRESS NOTE                                                                                                                                                                                                             Patient Demographics:    Lawrence Wells, is a 64 y.o. male, DOB - 06/19/1956, CVE:[id number]  Admit date - 01/19/2020   Admitting Physician Shelly Coss, MD  Outpatient Primary MD for the patient is Parks Ranger Devonne Doughty, DO  LOS - 3   Chief Complaint  Patient presents with  . Shortness of Breath       Brief Narrative   Lawrence Wells is a 64 y.o. male with medical history significant of pulmonary fibrosis, rheumatoid arthritis, COPD who was sent to the emergency department as per his pulmonologist advise for the management of Covid illness.  Patient was tested positive for Covid on 01/10/2020 at a clinic at [facility].  Patient presents to ED with increased requirement of oxygen, and hypoxia, patient on 2 L with activity at baseline, he is currently requiring 3 to 4 L with activity, so he was admitted to further work-up, his worsening hypoxia appears to be pulmonary fibrosis exacerbation in the setting of COVID-19 infection, he was admitted for further work-up.   Subjective:    Lawrence Wells today has, No headache, No chest pain, No abdominal pain - No Nausea, reports he is feeling better today, dyspnea has improved, as well reports his cough has improved.   Assessment  & Plan :    Principal Problem:   Acute respiratory disease due to COVID-19 virus Active Problems:   Hypercholesterolemia   Pulmonary fibrosis (HCC)   Rheumatoid arthritis involving multiple sites with positive rheumatoid factor ([place])   COVID-19  acute on chronic hypoxic respiratory failure with COVID-19 infection. -Patient presents with increased oxygen requirement, at baseline 2 L with activity, but he has been having 3 to 4 L even at rest recently. -He received  Covid vaccine x2, but he tested positive for Covid on 5/24. -Have discussed with pulmonary Dr. Chase Caller, he did review high-resolution CT, there is some degree of increased groundglass opacity, so his worsening hypoxia most likely related to pulmonary fibrosis exacerbation, which is provoked by his COVID-19 infection . -We will continue to treat on IV steroids for both pulmonary fibrosis exacerbation and COVID-19  infection, IV Solu-Medrol will be increased to 60 mg IV every 6 hours, which is more appropriate for pulmonary fibrosis exacerbation, for next 24 hours, and hopefully he can tomorrow 60 mg of oral prednisone, with taper over 3 weeks as discussed with pulmonary. -Continue with IV remdesivir. -Procalcitonin within normal limits, no indication for antibiotics. -Was encouraged to get out of bed to chair, use incentive spirometry and flutter valve.  Chronic diastolic CHF -2D echo was done, grade 2 diastolic dysfunction with preserved EF, patient BNP mildly elevated, but he does appear to be in acute CHF or volume overload, no need for IV Lasix, but will start on Lasix 20 mg oral daily.  COVID-19 Labs  Recent Labs    01/20/20 0556 01/21/20 0818 01/22/20 0454  DDIMER 1.36* 1.01* 0.70*  FERRITIN 229 289 278  CRP 11.3* 6.0* 2.3*    Lab Results  Component Value Date   SARSCOV2NAA NEGATIVE 10/07/2019   [place] NEGATIVE 09/15/2019    New onset A. Fib -We will check TSH, appears to be rate controlled, new onset, CHA2DS2-VASc score of 2, cussed with cardiology Dr. Debara Pickett, recommendation is to start on Eliquis, especially with his history of COVID-19 infection currently, which might risk for hypercoagulable state   Pulmonary fibrosis exacerbation : -Discussed with Dr. Chase Caller, please see above discussion .  History of COPD:  Past smoker.  Quit smoking in 2007.  Uses bronchodilators at home.  Rheumatoid arthritis:  -Follows with rheumatology.  On Imuran.  Hyperlipidemia:    On Zetia at home.   Code Status : Full  Family Communication  : D/W spouse by phone.  Disposition Plan  :  Status is: Inpatient  Remains inpatient appropriate because:IV treatments appropriate due to intensity of illness or inability to take PO and Inpatient level of care appropriate due to severity of illness   Dispo: The patient is from: Home              Anticipated d/c is to: Home              Anticipated d/c date is: 1 day              Patient currently is not medically stable to d/c.       Consults  :  None  Procedures  : None  DVT Prophylaxis  :  [place] lovenox  Lab Results  Component Value Date   PLT 558 (H) 01/22/2020    Antibiotics  :    Anti-infectives (From admission, onward)   Start     Dose/Rate Route Frequency Ordered Stop   01/20/20 1000  remdesivir 100 mg in sodium chloride 0.9 % 100 mL IVPB     100 mg 200 mL/hr over 30 Minutes Intravenous Daily 01/19/20 1712 01/24/20 0959   01/19/20 1715  remdesivir 200 mg in sodium chloride 0.9% 250 mL IVPB     200 mg 580 mL/hr over 30 Minutes Intravenous Once 01/19/20 1712 01/19/20 2130        Objective:   Vitals:   01/21/20 0509 01/21/20 1205 01/21/20 1950 01/22/20 0512  BP: 111/71  95/64 108/69  Pulse: (!) 104  84 67  Resp: 20  19 20   Temp: 97.8 F (36.6 C) 97.8 F (36.6 C) 98 F (36.7 C) 97.7 F (36.5 C)  TempSrc: Oral Oral Oral Oral  SpO2: (!) 85%  92% 91%  Weight:      Height:        Wt Readings from Last 3 Encounters:  01/19/20 71.6 kg  12/23/19 74.5 kg  12/02/19 74.3 kg     Intake/Output Summary (Last 24 hours) at 01/22/2020 1148 Last data filed at 01/22/2020 0900 Gross per 24 hour  Intake 1400 ml  Output --  Net 1400 ml     Physical Exam  Awake Alert, Oriented X 3, No new F.N deficits, Normal affect Symmetrical Chest wall movement, Good air movement bilaterally, diffuse rhonchi more at bases RRR,No Gallops,Rubs or new Murmurs, No Parasternal Heave +ve B.Sounds, Abd Soft, No  tenderness, No rebound - guarding or rigidity. No Cyanosis, Clubbing or edema, No new Rash or bruise       Data Review:    CBC Recent Labs  Lab 01/19/20 1428 01/20/20 0556 01/21/20 0818 01/22/20 0454  WBC 19.5* 13.1* 14.4* 20.7*  HGB 14.9 14.3 15.3 14.1  HCT 45.7 41.7 45.7 42.7  PLT 490* 427* 508* 558*  MCV 97.4 95.0 95.8 97.3  MCH 31.8 32.6 32.1 32.1  MCHC 32.6 34.3 33.5 33.0  RDW 13.2 12.9 13.1 13.0  LYMPHSABS  --  2.7 2.3 2.4  MONOABS  --  0.8 0.5 0.7  EOSABS  --  0.0 0.0 0.0  BASOSABS  --  0.0 0.0 0.0    Chemistries  Recent Labs  Lab 01/19/20 1428 01/20/20 0556 01/21/20 0818 01/22/20 0454  NA 136 138 141 140  K 4.9 5.0 4.3 4.9  CL 102 103 102 102  CO2 25 26 26 29   GLUCOSE 142* 130* 137* 153*  BUN 34* 33* 30* 39*  CREATININE 1.17 0.81 0.79 0.85  CALCIUM 9.5 9.1 9.4 9.2  AST  --  17 21 20   ALT  --  15 17 21   ALKPHOS  --  103 110 91  BILITOT  --  0.5 0.5 0.5   ------------------------------------------------------------------------------------------------------------------ No results for input(s): CHOL, HDL, LDLCALC, TRIG, CHOLHDL, LDLDIRECT in the last 72 hours.  Lab Results  Component Value Date   HGBA1C 5.7 (H) 09/02/2019   ------------------------------------------------------------------------------------------------------------------ No results for input(s): TSH, T4TOTAL, T3FREE, THYROIDAB in the last 72 hours.  Invalid input(s): FREET3 ------------------------------------------------------------------------------------------------------------------ Recent Labs    01/21/20 0818 01/22/20 0454  FERRITIN 289 278    Coagulation profile No results for input(s): INR, PROTIME in the last 168 hours.  Recent Labs    01/21/20 0818 01/22/20 0454  DDIMER 1.01* 0.70*    Cardiac Enzymes No results for input(s): CKMB, TROPONINI, MYOGLOBIN in the last 168 hours.  Invalid input(s):  CK ------------------------------------------------------------------------------------------------------------------    Component Value Date/Time   BNP 271.4 (H) 01/21/2020 0818    Inpatient Medications  Scheduled Meds: . apixaban  5 mg Oral BID  . vitamin C  500 mg Oral Daily  . azaTHIOprine  100 mg Oral Daily  . ezetimibe  10 mg Oral Daily  . famotidine  20 mg Oral BID  . feeding supplement (ENSURE ENLIVE)  237 mL Oral BID BM  . feeding supplement (PRO-STAT SUGAR FREE 64)  30 mL Oral BID  . Ipratropium-Albuterol  1 puff Inhalation Q6H  . methylPREDNISolone (SOLU-MEDROL) injection  60 mg Intravenous Q6H  . sodium chloride flush  3 mL Intravenous Once  . zinc sulfate  220 mg Oral Daily   Continuous Infusions: . remdesivir 100 mg in NS 100 mL 100 mg (01/22/20 0743)   PRN Meds:.guaiFENesin-dextromethorphan, polyethylene glycol  Micro Results No results found for this or any previous visit (from the past 240 hour(s)).  Radiology Reports CT ANGIO CHEST PE W OR WO CONTRAST  Result  Date: 01/20/2020 CLINICAL DATA:  64 year old male with hypoxia and COVID-19. Rule out pulmonary embolism. EXAM: CT ANGIOGRAPHY CHEST WITH CONTRAST TECHNIQUE: Multidetector CT imaging of the chest was performed using the standard protocol during bolus administration of intravenous contrast. Multiplanar CT image reconstructions and MIPs were obtained to evaluate the vascular anatomy. CONTRAST:  39mL OMNIPAQUE IOHEXOL 350 MG/ML SOLN COMPARISON:  Chest radiograph dated 01/19/2020 and CT dated 09/14/2019. FINDINGS: Cardiovascular: There is mild cardiomegaly. No pericardial effusion. Coronary vascular calcifications primarily involving the LAD and RCA. The thoracic aorta is unremarkable. Mild dilatation of the main pulmonary trunk suggestive of a degree of pulmonary hypertension. Evaluation of the pulmonary arteries is somewhat limited due to respiratory motion artifact. No pulmonary artery embolus identified.  Mediastinum/Nodes: Several top-normal bilateral hilar and mediastinal lymph nodes. These measure up to approximately 11 mm in short axis in the right hilum 13 mm in the subcarinal region and 10 mm in the aortopulmonic window relatively similar to prior CT. The esophagus is grossly unremarkable. No mediastinal fluid collection. Lungs/Pleura: There is background of emphysema and interstitial lung disease. There is subpleural reticulation and honeycombing most severely involving the left lung base. There is traction bronchiectasis. Diffuse ground-glass density throughout the lungs with confluent densities involving the lower lung field may represent superimposed pneumonia. Right apical subpleural scarring. No lobar consolidation, pleural effusion, or pneumothorax. The central airways are patent. Upper Abdomen: No acute abnormality. Musculoskeletal: No chest wall abnormality. No acute or significant osseous findings. Review of the MIP images confirms the above findings. IMPRESSION: 1. No CT evidence of pulmonary artery embolus. 2. Emphysema and interstitial lung disease. 3. Diffuse airspace ground-glass opacity and confluent areas of densities primarily involving the lower lung field concerning for superimposed pneumonia. Clinical correlation and follow-up recommended. Please refer to the accompanying high-resolution CT report for detail pleuroparenchymal findings. 4. Aortic Atherosclerosis (ICD10-I70.0) and Emphysema (ICD10-J43.9). Electronically Signed   By: Anner Crete M.D.   On: 01/20/2020 23:42   CT Chest High Resolution  Result Date: 01/21/2020 CLINICAL DATA:  65 year old male with history of hypoxia and COVID-19. EXAM: CT CHEST WITHOUT CONTRAST TECHNIQUE: Multidetector CT imaging of the chest was performed following the standard protocol without intravenous contrast. High resolution imaging of the lungs, as well as inspiratory and expiratory imaging, was performed. COMPARISON:  Chest CT 09/14/2019.  FINDINGS: Comment: Study is limited by non standard protocol. Cardiovascular: Heart size is mildly enlarged with dilatation of the right atrium and right ventricle. There is no significant pericardial fluid, thickening or pericardial calcification. There is aortic atherosclerosis, as well as atherosclerosis of the great vessels of the mediastinum and the coronary arteries, including calcified atherosclerotic plaque in the left main, left anterior descending and right coronary arteries. Dilatation of the pulmonic trunk (4.0 cm in diameter). Mediastinum/Nodes: Multiple prominent but nonenlarged mediastinal and bilateral hilar lymph nodes, similar to the prior study, presumably chronic and reactive in the setting of interstitial lung disease. Esophagus is unremarkable in appearance. No axillary lymphadenopathy. Lungs/Pleura: Standard high-resolution protocol was not performed. With this limitation in mind, the limited high-resolution images clearly demonstrate widespread areas of septal thickening, subpleural reticulation, parenchymal banding, traction bronchiectasis and honeycombing in the lungs bilaterally (left greater than right). Findings again have a craniocaudal gradient and do not appear progressive compared to the prior study from September 14, 2019. Inspiratory and expiratory imaging was performed and is unremarkable. Diffuse bronchial wall thickening with moderate centrilobular and paraseptal emphysema. No acute consolidative airspace disease. No pleural effusions. No definite suspicious  appearing pulmonary nodules or masses are noted. Chronic partially calcified pleuroparenchymal thickening and architectural distortion in the lung apices bilaterally (right greater than left), similar to the prior study, most compatible with chronic post infectious or inflammatory scarring. Upper Abdomen: Aortic atherosclerosis. Musculoskeletal: There are no aggressive appearing lytic or blastic lesions noted in the visualized  portions of the skeleton. IMPRESSION: 1. The appearance of the lungs is very similar to the prior study with a spectrum of findings considered diagnostic of usual interstitial pneumonia (UIP) per current ATS guidelines. 2. Diffuse bronchial wall thickening with moderate centrilobular and paraseptal emphysema; imaging findings suggestive of underlying COPD. 3. Cardiomegaly with dilatation of the right atrium and right ventricle. There is also dilatation of the pulmonic trunk (4.0 cm in diameter), concerning for pulmonary arterial hypertension. 4. Aortic atherosclerosis, in addition to left main and 2 vessel coronary artery disease. Please note that although the presence of coronary artery calcium documents the presence of coronary artery disease, the severity of this disease and any potential stenosis cannot be assessed on this non-gated CT examination. Assessment for potential risk factor modification, dietary therapy or pharmacologic therapy may be warranted, if clinically indicated. Aortic Atherosclerosis (ICD10-I70.0) and Emphysema (ICD10-J43.9). Electronically Signed   By: Vinnie Langton M.D.   On: 01/21/2020 08:16   DG Chest Port 1 View  Result Date: 01/19/2020 CLINICAL DATA:  Shortness of breath, COVID-19 positive, pulmonary fibrosis EXAM: PORTABLE CHEST 1 VIEW COMPARISON:  09/08/2019 FINDINGS: Stable cardiomediastinal contours. Diffuse interstitial fibrotic lung changes with a basilar predominance no definite superimposed airspace opacity. Subtle area of nodularity in the left lung apex appears less prominent compared to the prior study. No pleural effusion or pneumothorax. IMPRESSION: Diffuse interstitial fibrotic lung changes without definite superimposed airspace opacity. Electronically Signed   By: Davina Poke D.O.   On: 01/19/2020 16:14   ECHOCARDIOGRAM COMPLETE  Result Date: 01/20/2020    ECHOCARDIOGRAM REPORT   Patient Name:   NABEEL GLADSON Date of Exam: 01/20/2020 Medical Rec #:  [id number]       Height:       71.0 in Accession #:    [id number]     Weight:       157.8 lb Date of Birth:  10-Mar-1956      BSA:          1.907 m Patient Age:    89 years       BP:           114/77 mmHg Patient Gender: M              HR:           83 bpm. Exam Location:  Inpatient Procedure: 2D Echo Indications:    dyspnea  History:        Patient has no prior history of Echocardiogram examinations.                 CAD, COPD; Risk Factors:Dyslipidemia and Former Smoker.  Sonographer:    Jannett Celestine RDCS (AE) Referring Phys: [id number] Omarian Jaquith S Terrisa Curfman IMPRESSIONS  1. Left ventricular ejection fraction, by estimation, is 60 to 65%. The left ventricle has normal function. The left ventricle has no regional wall motion abnormalities. Left ventricular diastolic parameters are consistent with Grade II diastolic dysfunction (pseudonormalization). There is the interventricular septum is flattened in diastole ('D' shaped left ventricle), consistent with right ventricular volume overload.  2. Right ventricular systolic function is moderately reduced. The right ventricular size is normal.  3.  The mitral valve is normal in structure. No evidence of mitral valve regurgitation. No evidence of mitral stenosis.  4. The aortic valve is normal in structure. Aortic valve regurgitation is not visualized. No aortic stenosis is present. FINDINGS  Left Ventricle: Left ventricular ejection fraction, by estimation, is 60 to 65%. The left ventricle has normal function. The left ventricle has no regional wall motion abnormalities. The left ventricular internal cavity size was normal in size. There is  no left ventricular hypertrophy. The interventricular septum is flattened in diastole ('D' shaped left ventricle), consistent with right ventricular volume overload. Left ventricular diastolic parameters are consistent with Grade II diastolic dysfunction (pseudonormalization). Right Ventricle: The right ventricular size is normal. No increase in right  ventricular wall thickness. Right ventricular systolic function is moderately reduced. Left Atrium: Left atrial size was normal in size. Right Atrium: Right atrial size was normal in size. Pericardium: There is no evidence of pericardial effusion. Mitral Valve: The mitral valve is normal in structure. No evidence of mitral valve regurgitation. No evidence of mitral valve stenosis. Tricuspid Valve: The tricuspid valve is normal in structure. Tricuspid valve regurgitation is trivial. No evidence of tricuspid stenosis. Aortic Valve: The aortic valve is normal in structure. Aortic valve regurgitation is not visualized. No aortic stenosis is present. Pulmonic Valve: The pulmonic valve was normal in structure. Pulmonic valve regurgitation is not visualized. No evidence of pulmonic stenosis. Aorta: The aortic root and ascending aorta are structurally normal, with no evidence of dilitation. IAS/Shunts: The atrial septum is grossly normal.  LEFT VENTRICLE PLAX 2D LVIDd:         4.15 cm  Diastology LVIDs:         2.80 cm  LV e' lateral:   11.25 cm/s LV PW:         1.25 cm  LV E/e' lateral: 4.9 LV IVS:        1.30 cm  LV e' medial:    5.33 cm/s LVOT diam:     2.30 cm  LV E/e' medial:  10.3 LV SV:         43 LV SV Index:   22 LVOT Area:     4.15 cm  LEFT ATRIUM           Index       RIGHT ATRIUM           Index LA diam:      4.30 cm 2.26 cm/m  RA Area:     27.40 cm LA Vol (A4C): 46.6 ml 24.44 ml/m RA Volume:   101.00 ml 52.97 ml/m  AORTIC VALVE LVOT Vmax:   67.20 cm/s LVOT Vmean:  54.800 cm/s LVOT VTI:    0.103 m  AORTA Ao Root diam: 3.50 cm MITRAL VALVE MV Area (PHT): 3.77 cm    SHUNTS MV Decel Time: 201 msec    Systemic VTI:  0.10 m MV E velocity: 54.80 cm/s  Systemic Diam: 2.30 cm MV A velocity: 45.80 cm/s MV E/A ratio:  1.20 Mertie Moores MD Electronically signed by Mertie Moores MD Signature Date/Time: 01/20/2020/3:16:40 PM    Final      Phillips Climes M.D on 01/22/2020 at 11:48 AM    [facility] -   Office  [phone]

## 2020-01-22 NOTE — Care Management (Signed)
Sent Eliquis card through tube station. Notified nurse to make sure the patient receives it.

## 2020-01-22 NOTE — Progress Notes (Signed)
The patient had a 14 beat run of Vtach with no complaints of chest pain.  Blount, NP was notified.  No new orders received.  Will continue to monitor.

## 2020-01-22 NOTE — Evaluation (Signed)
Physical Therapy Evaluation/Discharge Patient Details Name: Lawrence Wells MRN: [id number] DOB: 17-Apr-1956 Today's Date: 01/22/2020   History of Present Illness  64 y.o. male admitted on 01/22/20 for SOB, DOE, loss of appetite.  Dx with COVID 19 PNA.  Pt with significant PMH of pulmonary fibrosis, emphysema, CAD, COPD, arthritis, on home O2 at baseline.   Clinical Impression  Pt did well ambulating on 4 L O2 [place], walking and talking throughout, lowest O2 sat was 85%.  Pt reports feeling significantly better than when he arrived at the hospital.  He has no further acute or follow up therapy needs at this time.  PT to sign off.    Follow Up Recommendations No PT follow up    Equipment Recommendations  None recommended by PT    Recommendations for Other Services   NA    Precautions / Restrictions Precautions Precautions: Other (comment) Precaution Comments: monitor O2      Mobility  Bed Mobility               General bed mobility comments: Pt OOB in the recliner chair.   Transfers Overall transfer level: Independent                  Ambulation/Gait Ambulation/Gait assistance: Independent Gait Distance (Feet): 1000 Feet Assistive device: None Gait Pattern/deviations: WFL(Within Functional Limits)     General Gait Details: Good spee, walking and talking on 4 L O2 [place], lowest O2 sat observed that had a good wave was 85%, 90% at rest         Balance Overall balance assessment: No apparent balance deficits (not formally assessed)                                           Pertinent Vitals/Pain Pain Assessment: No/denies pain    Home Living Family/patient expects to be discharged to:: Private residence Living Arrangements: Spouse/significant other Available Help at Discharge: Family;Available 24 hours/day Type of Home: House Home Access: Stairs to enter     Home Layout: One level Home Equipment: Other (comment)(home O2) Additional  Comments: 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     Prior Function Level of Independence: Independent               Hand Dominance   Dominant Hand: Right    Extremity/Trunk Assessment   Upper Extremity Assessment Upper Extremity Assessment: Overall WFL for tasks assessed    Lower Extremity Assessment Lower Extremity Assessment: Overall WFL for tasks assessed    Cervical / Trunk Assessment Cervical / Trunk Assessment: Normal  Communication   Communication: No difficulties  Cognition Arousal/Alertness: Awake/alert Behavior During Therapy: WFL for tasks assessed/performed Overall Cognitive Status: Within Functional Limits for tasks assessed                                        General Comments General comments (skin integrity, edema, etc.): Pt reports compliance with IS up to 1500 mL, encouraged him to walk a few laps around the room every time he got up to the bathroom and preform some squats, toe raises, and standing marches at the window before sitting back down.          Assessment/Plan    PT Assessment Patent does not need any  further PT services         PT Goals (Current goals can be found in the Care Plan section)  Acute Rehab PT Goals Patient Stated Goal: to go home tomorrow PT Goal Formulation: All assessment and education complete, DC therapy               AM-PAC PT "6 Clicks" Mobility  Outcome Measure Help needed turning from your back to your side while in a flat bed without using bedrails?: None Help needed moving from lying on your back to sitting on the side of a flat bed without using bedrails?: None Help needed moving to and from a bed to a chair (including a wheelchair)?: None Help needed standing up from a chair using your arms (e.g., wheelchair or bedside chair)?: None Help needed to walk in hospital room?: None Help needed climbing 3-5 steps with a railing? : None 6 Click Score: 24    End of  Session Equipment Utilized During Treatment: Oxygen Activity Tolerance: Patient tolerated treatment well Patient left: in chair;with call bell/phone within reach Nurse Communication: Mobility status      Time: 4259-5638 PT Time Calculation (min) (ACUTE ONLY): 14 min   Charges:        Verdene Lennert, PT, DPT  Acute Rehabilitation #[phone] pager #[phone] office     PT Evaluation $PT Eval Moderate Complexity: 1 Mod         01/22/2020, 6:54 PM

## 2020-01-23 LAB — COMPREHENSIVE METABOLIC PANEL
ALT: 41 U/L (ref 0–44)
AST: 33 U/L (ref 15–41)
Albumin: 2.6 g/dL — ABNORMAL LOW (ref 3.5–5.0)
Alkaline Phosphatase: 78 U/L (ref 38–126)
Anion gap: 10 (ref 5–15)
BUN: 36 mg/dL — ABNORMAL HIGH (ref 8–23)
CO2: 28 mmol/L (ref 22–32)
Calcium: 9 mg/dL (ref 8.9–10.3)
Chloride: 98 mmol/L (ref 98–111)
Creatinine, Ser: 0.85 mg/dL (ref 0.61–1.24)
GFR calc Af Amer: >60 mL/min (ref >60)
GFR calc non Af Amer: >60 mL/min (ref >60)
Glucose, Bld: 152 mg/dL — ABNORMAL HIGH (ref 70–99)
Potassium: 4.7 mmol/L (ref 3.5–5.1)
Sodium: 136 mmol/L (ref 135–145)
Total Bilirubin: 0.2 mg/dL — ABNORMAL LOW (ref 0.3–1.2)
Total Protein: 5.8 g/dL — ABNORMAL LOW (ref 6.5–8.1)

## 2020-01-23 LAB — CBC
HCT: 39.5 % (ref 39.0–52.0)
Hemoglobin: 13.1 g/dL (ref 13.0–17.0)
MCH: 31.9 pg (ref 26.0–34.0)
MCHC: 33.2 g/dL (ref 30.0–36.0)
MCV: 96.1 fL (ref 80.0–100.0)
Platelets: 472 10*3/uL — ABNORMAL HIGH (ref 150–400)
RBC: 4.11 MIL/uL — ABNORMAL LOW (ref 4.22–5.81)
RDW: 12.7 % (ref 11.5–15.5)
WBC: 19.6 10*3/uL — ABNORMAL HIGH (ref 4.0–10.5)
nRBC: 0 % (ref 0.0–0.2)

## 2020-01-23 LAB — FERRITIN: Ferritin: 240 ng/mL (ref 24–336)

## 2020-01-23 LAB — D-DIMER, QUANTITATIVE: D-Dimer, Quant: 0.53 ug/mL-FEU — ABNORMAL HIGH (ref 0.00–0.50)

## 2020-01-23 LAB — C-REACTIVE PROTEIN: CRP: 1.1 mg/dL — ABNORMAL HIGH (ref <1.0)

## 2020-01-23 MED ORDER — OFEV 150 MG PO CAPS: ORAL_CAPSULE | ORAL | 11 refills | Status: DC

## 2020-01-23 MED ORDER — APIXABAN 5 MG PO TABS: 5.0000 mg | ORAL_TABLET | Freq: Two times a day (BID) | ORAL | 0 refills | Status: DC

## 2020-01-23 MED ORDER — PANTOPRAZOLE SODIUM 40 MG PO TBEC
40.0000 mg | DELAYED_RELEASE_TABLET | Freq: Every day | ORAL | 1 refills | Status: DC
Start: 2020-01-23 — End: 2020-09-27

## 2020-01-23 MED ORDER — PREDNISONE 10 MG PO TABS
ORAL_TABLET | ORAL | 0 refills | Status: DC
Start: 2020-01-23 — End: 2020-02-23

## 2020-01-23 NOTE — Discharge Summary (Signed)
Lawrence Wells, is a 64 y.o. male  DOB [birth year]  MRN [id number].  Admission date:  01/19/2020  Admitting Physician  Shelly Coss, MD  Discharge Date:  01/23/2020   Primary MD  Olin Hauser, DO  Recommendations for primary care physician for things to follow:  -To follow with cardiology as an outpatient regarding new onset A. fib and decision about anticoagulation. -To follow with pulmonary as an outpatient. -Please check CBC, CMP during next visit   Admission Diagnosis  Hypoxia [R09.02] COVID-19 [U07.1]   Discharge Diagnosis  Hypoxia [R09.02] COVID-19 [U07.1]    Principal Problem:   Acute respiratory disease due to COVID-19 virus Active Problems:   Hypercholesterolemia   Pulmonary fibrosis ([place])   Rheumatoid arthritis involving multiple sites with positive rheumatoid factor ([place])   COVID-19      Past Medical History:  Diagnosis Date  . Arthritis   . COPD (chronic obstructive pulmonary disease) ([place])   . Coronary artery disease   . Dyspnea   . Emphysema lung ([place])   . Hyperlipidemia   . Pulmonary filariasis     Past Surgical History:  Procedure Laterality Date  . SINUS SURGERY WITH INSTATRAK         History of present illness and  Hospital Course:     Kindly see H&P for history of present illness and admission details, please review complete Labs, Consult reports and Test reports for all details in brief  HPI  from the history and physical done on the day of admission 01/19/2020   HPI: Lawrence Wells is a 64 y.o. male with medical history significant of pulmonary fibrosis, rheumatoid arthritis, COPD who was sent to the emergency department as per his pulmonologist advise for the management of Covid illness.  Patient was tested positive for Covid on 01/10/2020 at a clinic at [facility].  Patient uses oxygen intermittently at home for his pulmonary fibrosis at 2 L/min.  He  was doing well before 2 days but since last 2 days he started having more shortness of breath, dyspnea on mild exertion, also developed productive cough and loss of appetite.  He also required 4 L of oxygen per minute which is more than his baseline.  He is feeling more lethargic and sleepy.  He lives with his wife. Patient has been vaccinated twice against Covid. He follows with pulmonology at [facility] for his pulmonary fibrosis.  He follows with Dr. Chase Caller.  He was advised by his pulmonologist to go to the emergency department for admission for remdesivir infusion.   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  He also has history of rheumatoid arthritis and is on Imuran.  Patient is also being considered for possible lung transplantation. Patient seen and examined at the bedside in the emergency department.  Currently requiring 3 - 4 L of oxygen per minute.  At rest he does not have any shortness of breath but he complains of chest tightness as if some body sitting on his chest.  He denies any fever, chills, nausea, vomiting, diarrhea, abdominal pain, dysuria or loss of consciousness. Patient is a past smoker and quit smoking in 2007.  ED Course: On presentation, he was hemodynamically stable.  He was found to have leukocytosis , he was afebrile.  Chest x-ray showed diffuse interstitial fibrotic lung changes without definite superimposed airspace opacity.  Inflammatory markers have been ordered.  Patient started on Decadron and remdesivir.   Hospital Course    acute on chronic hypoxic respiratory failure with COVID-19 infection. -Patient presents with increased oxygen requirement, at baseline 2 L with activity, but he has been having 3 to 4 L even at rest recently. -He received Covid vaccine x2, but he tested positive for Covid on 5/24. -Have discussed with pulmonary Dr. Chase Caller, he did review high-resolution CT, there is some degree of increased groundglass  opacity, so his worsening hypoxia most likely related to pulmonary fibrosis exacerbation, which is provoked by his COVID-19 infection . -He was treated with IV steroids during hospital stay, was treated with IV Solu-Medrol and gets more appropriate on Decadron 4 [place] fibrosis exacerbation, he will be discharged on prednisone taper 60 mg oral daily to be tapered over 3 weeks . -Did receive total of 5 days of IV remdesivir during hospital stay . -Procalcitonin within normal limits, no indication for antibiotics. -Was encouraged to be using incentive spirometry and flutter valve.  Pulmonary fibrosis exacerbation : -Please see above discussion, patient will be resumed on his off of medication, 1 tablet daily for 1 week, can go back to his home dose twice daily as discussed with Dr Chase Caller.  Chronic diastolic CHF -2D echo was done, grade 2 diastolic dysfunction with preserved EF, patient BNP mildly elevated, but he does not  appear to be in acute CHF or volume overload, no indication for diuresis.  New onset A. Fib -We will check TSH, appears to be rate controlled, new onset, CHA2DS2-VASc score of 2, cussed with cardiology Dr. Debara Pickett, recommendation is to start on Eliquis, especially with his history of COVID-19 infection currently, which might risk for hypercoagulable state  -As well patient had 14 beats of NSVT during hospital stay, he was asymptomatic, electrolytes are corrected normal EF during hospital stay, at this point I think it is to start any beta-blockers in the setting of his severe lung disease.  History of COPD: Past smoker. Quit smoking in 2007. Uses bronchodilators at home.  Rheumatoid arthritis:  -Follows with rheumatology. On Imuran.  Hyperlipidemia:  OnZetiaat home.    Discharge Condition:  stable   Follow UP  Follow-up Information    Olin Hauser, DO Follow up.   Specialty: Family Medicine Contact information: [address] [place]  [id number] [phone]        Minna Merritts, MD Follow up in 2 week(s).   Specialty: Cardiology Contact information: [place] [address] [phone]             Discharge Instructions  and  Discharge Medications    Discharge Instructions    Discharge instructions   Complete by: As directed    Follow with Primary MD Olin Hauser, DO in 10 days   Get CBC, CMP,  checked  by Primary MD next visit.    Activity: As tolerated with Full fall precautions use walker/cane & assistance as needed   Disposition Home    Diet: Regular diet.  For Heart failure patients - Check your Weight same time everyday, if  you gain over 2 pounds, or you develop in leg swelling,   Please request your Prim.MD to go over all Hospital Tests and Procedure/Radiological results at the follow up, please get all Hospital records sent to your Prim MD by signing hospital release before you go home.   If you experience worsening of your admission symptoms, develop shortness of breath, life threatening emergency, suicidal or homicidal thoughts you must seek medical attention immediately by calling 911 or calling your MD immediately  if symptoms less severe.  You Must read complete instructions/literature along with all the possible adverse reactions/side effects for all the Medicines you take and that have been prescribed to you. Take any new Medicines after you have completely understood and accpet all the possible adverse reactions/side effects.   Do not drive, operating heavy machinery, perform activities at heights, swimming or participation in water activities or provide baby sitting services if your were admitted for syncope or siezures until you have seen by Primary MD or a Neurologist and advised to do so again.  Do not drive when taking Pain medications.    Do not take more than prescribed Pain, Sleep and Anxiety Medications  Special Instructions: If  you have smoked or chewed Tobacco  in the last 2 yrs please stop smoking, stop any regular Alcohol  and or any Recreational drug use.  Wear Seat belts while driving.   Please note  You were cared for by a hospitalist during your hospital stay. If you have any questions about your discharge medications or the care you received while you were in the hospital after you are discharged, you can call the unit and asked to speak with the hospitalist on call if the hospitalist that took care of you is not available. Once you are discharged, your primary care physician will handle any further medical issues. Please note that NO REFILLS for any discharge medications will be authorized once you are discharged, as it is imperative that you return to your primary care physician (or establish a relationship with a primary care physician if you do not have one) for your aftercare needs so that they can reassess your need for medications and monitor your lab values.   Increase activity slowly   Complete by: As directed      Allergies as of 01/23/2020      Reactions   Atorvastatin Other (See Comments)   Myalgias      Medication List    STOP taking these medications   naproxen 500 MG tablet Commonly known as: NAPROSYN     TAKE these medications   apixaban 5 MG Tabs tablet Commonly known as: ELIQUIS Take 1 tablet (5 mg total) by mouth 2 (two) times daily.   azaTHIOprine 50 MG tablet Commonly known as: IMURAN Take 2 tablets (100 mg total) by mouth daily.   diclofenac Sodium 1 % Gel Commonly known as: VOLTAREN Apply 2 g topically 3 (three) times daily as needed. What changed: reasons to take this   ezetimibe 10 MG tablet Commonly known as: ZETIA Take 1 tablet (10 mg total) by mouth daily.   gabapentin 100 MG capsule Commonly known as: NEURONTIN Start 1 capsule daily, increase by 1 cap every 2-3 days as tolerated up to 3 times a day, or may take 3 at once in evening. What changed:   how much  to take  how to take this  when to take this  additional instructions   multivitamin tablet Take 1 tablet by mouth  daily.   Ofev 150 MG Caps Generic drug: Nintedanib Please take 1 tablet oral daily for 1 week, then resume back to 1 tablet twice daily after  that What changed: See the new instructions.   OXYGEN Inhale into the lungs as needed.   pantoprazole 40 MG tablet Commonly known as: Protonix Take 1 tablet (40 mg total) by mouth daily.   predniSONE 10 MG tablet Commonly known as: DELTASONE Take 60 mg daily for 5 days, then 40 mg oral daily for 5 days, then 20 mg oral daily for 5 days, then 10 mg oral daily for 5 days.   Spiriva Respimat 2.5 MCG/ACT Aers Generic drug: Tiotropium Bromide Monohydrate Inhale 2 puffs into the lungs daily.         Diet and Activity recommendation: See Discharge Instructions above   Consults obtained -  D/W Pulmonary and cardiology via phone   Major procedures and Radiology Reports - PLEASE review detailed and final reports for all details, in brief -      CT ANGIO CHEST PE W OR WO CONTRAST  Result Date: 01/20/2020 CLINICAL DATA:  64 year old male with hypoxia and COVID-19. Rule out pulmonary embolism. EXAM: CT ANGIOGRAPHY CHEST WITH CONTRAST TECHNIQUE: Multidetector CT imaging of the chest was performed using the standard protocol during bolus administration of intravenous contrast. Multiplanar CT image reconstructions and MIPs were obtained to evaluate the vascular anatomy. CONTRAST:  83mL OMNIPAQUE IOHEXOL 350 MG/ML SOLN COMPARISON:  Chest radiograph dated 01/19/2020 and CT dated 09/14/2019. FINDINGS: Cardiovascular: There is mild cardiomegaly. No pericardial effusion. Coronary vascular calcifications primarily involving the LAD and RCA. The thoracic aorta is unremarkable. Mild dilatation of the main pulmonary trunk suggestive of a degree of pulmonary hypertension. Evaluation of the pulmonary arteries is somewhat limited due to  respiratory motion artifact. No pulmonary artery embolus identified. Mediastinum/Nodes: Several top-normal bilateral hilar and mediastinal lymph nodes. These measure up to approximately 11 mm in short axis in the right hilum 13 mm in the subcarinal region and 10 mm in the aortopulmonic window relatively similar to prior CT. The esophagus is grossly unremarkable. No mediastinal fluid collection. Lungs/Pleura: There is background of emphysema and interstitial lung disease. There is subpleural reticulation and honeycombing most severely involving the left lung base. There is traction bronchiectasis. Diffuse ground-glass density throughout the lungs with confluent densities involving the lower lung field may represent superimposed pneumonia. Right apical subpleural scarring. No lobar consolidation, pleural effusion, or pneumothorax. The central airways are patent. Upper Abdomen: No acute abnormality. Musculoskeletal: No chest wall abnormality. No acute or significant osseous findings. Review of the MIP images confirms the above findings. IMPRESSION: 1. No CT evidence of pulmonary artery embolus. 2. Emphysema and interstitial lung disease. 3. Diffuse airspace ground-glass opacity and confluent areas of densities primarily involving the lower lung field concerning for superimposed pneumonia. Clinical correlation and follow-up recommended. Please refer to the accompanying high-resolution CT report for detail pleuroparenchymal findings. 4. Aortic Atherosclerosis (ICD10-I70.0) and Emphysema (ICD10-J43.9). Electronically Signed   By: Anner Crete M.D.   On: 01/20/2020 23:42   CT Chest High Resolution  Result Date: 01/21/2020 CLINICAL DATA:  64 year old male with history of hypoxia and COVID-19. EXAM: CT CHEST WITHOUT CONTRAST TECHNIQUE: Multidetector CT imaging of the chest was performed following the standard protocol without intravenous contrast. High resolution imaging of the lungs, as well as inspiratory and  expiratory imaging, was performed. COMPARISON:  Chest CT 09/14/2019. FINDINGS: Comment: Study is limited by non standard protocol. Cardiovascular: Heart size is mildly  enlarged with dilatation of the right atrium and right ventricle. There is no significant pericardial fluid, thickening or pericardial calcification. There is aortic atherosclerosis, as well as atherosclerosis of the great vessels of the mediastinum and the coronary arteries, including calcified atherosclerotic plaque in the left main, left anterior descending and right coronary arteries. Dilatation of the pulmonic trunk (4.0 cm in diameter). Mediastinum/Nodes: Multiple prominent but nonenlarged mediastinal and bilateral hilar lymph nodes, similar to the prior study, presumably chronic and reactive in the setting of interstitial lung disease. Esophagus is unremarkable in appearance. No axillary lymphadenopathy. Lungs/Pleura: Standard high-resolution protocol was not performed. With this limitation in mind, the limited high-resolution images clearly demonstrate widespread areas of septal thickening, subpleural reticulation, parenchymal banding, traction bronchiectasis and honeycombing in the lungs bilaterally (left greater than right). Findings again have a craniocaudal gradient and do not appear progressive compared to the prior study from September 14, 2019. Inspiratory and expiratory imaging was performed and is unremarkable. Diffuse bronchial wall thickening with moderate centrilobular and paraseptal emphysema. No acute consolidative airspace disease. No pleural effusions. No definite suspicious appearing pulmonary nodules or masses are noted. Chronic partially calcified pleuroparenchymal thickening and architectural distortion in the lung apices bilaterally (right greater than left), similar to the prior study, most compatible with chronic post infectious or inflammatory scarring. Upper Abdomen: Aortic atherosclerosis. Musculoskeletal: There are no  aggressive appearing lytic or blastic lesions noted in the visualized portions of the skeleton. IMPRESSION: 1. The appearance of the lungs is very similar to the prior study with a spectrum of findings considered diagnostic of usual interstitial pneumonia (UIP) per current ATS guidelines. 2. Diffuse bronchial wall thickening with moderate centrilobular and paraseptal emphysema; imaging findings suggestive of underlying COPD. 3. Cardiomegaly with dilatation of the right atrium and right ventricle. There is also dilatation of the pulmonic trunk (4.0 cm in diameter), concerning for pulmonary arterial hypertension. 4. Aortic atherosclerosis, in addition to left main and 2 vessel coronary artery disease. Please note that although the presence of coronary artery calcium documents the presence of coronary artery disease, the severity of this disease and any potential stenosis cannot be assessed on this non-gated CT examination. Assessment for potential risk factor modification, dietary therapy or pharmacologic therapy may be warranted, if clinically indicated. Aortic Atherosclerosis (ICD10-I70.0) and Emphysema (ICD10-J43.9). Electronically Signed   By: Vinnie Langton M.D.   On: 01/21/2020 08:16   DG Chest Port 1 View  Result Date: 01/19/2020 CLINICAL DATA:  Shortness of breath, COVID-19 positive, pulmonary fibrosis EXAM: PORTABLE CHEST 1 VIEW COMPARISON:  09/08/2019 FINDINGS: Stable cardiomediastinal contours. Diffuse interstitial fibrotic lung changes with a basilar predominance no definite superimposed airspace opacity. Subtle area of nodularity in the left lung apex appears less prominent compared to the prior study. No pleural effusion or pneumothorax. IMPRESSION: Diffuse interstitial fibrotic lung changes without definite superimposed airspace opacity. Electronically Signed   By: Davina Poke D.O.   On: 01/19/2020 16:14   ECHOCARDIOGRAM COMPLETE  Result Date: 01/20/2020    ECHOCARDIOGRAM REPORT   Patient  Name:   Lawrence Wells Date of Exam: 01/20/2020 Medical Rec #:  [id number]      Height:       71.0 in Accession #:    [id number]     Weight:       157.8 lb Date of Birth:  [birth year]      BSA:          1.907 m Patient Age:    20 years  BP:           114/77 mmHg Patient Gender: M              HR:           83 bpm. Exam Location:  Inpatient Procedure: 2D Echo Indications:    dyspnea  History:        Patient has no prior history of Echocardiogram examinations.                 CAD, COPD; Risk Factors:Dyslipidemia and Former Smoker.  Sonographer:    Jannett Celestine RDCS (AE) Referring Phys: [id number] Quinlin Conant S Damia Bobrowski IMPRESSIONS  1. Left ventricular ejection fraction, by estimation, is 60 to 65%. The left ventricle has normal function. The left ventricle has no regional wall motion abnormalities. Left ventricular diastolic parameters are consistent with Grade II diastolic dysfunction (pseudonormalization). There is the interventricular septum is flattened in diastole ('D' shaped left ventricle), consistent with right ventricular volume overload.  2. Right ventricular systolic function is moderately reduced. The right ventricular size is normal.  3. The mitral valve is normal in structure. No evidence of mitral valve regurgitation. No evidence of mitral stenosis.  4. The aortic valve is normal in structure. Aortic valve regurgitation is not visualized. No aortic stenosis is present. FINDINGS  Left Ventricle: Left ventricular ejection fraction, by estimation, is 60 to 65%. The left ventricle has normal function. The left ventricle has no regional wall motion abnormalities. The left ventricular internal cavity size was normal in size. There is  no left ventricular hypertrophy. The interventricular septum is flattened in diastole ('D' shaped left ventricle), consistent with right ventricular volume overload. Left ventricular diastolic parameters are consistent with Grade II diastolic dysfunction (pseudonormalization). Right  Ventricle: The right ventricular size is normal. No increase in right ventricular wall thickness. Right ventricular systolic function is moderately reduced. Left Atrium: Left atrial size was normal in size. Right Atrium: Right atrial size was normal in size. Pericardium: There is no evidence of pericardial effusion. Mitral Valve: The mitral valve is normal in structure. No evidence of mitral valve regurgitation. No evidence of mitral valve stenosis. Tricuspid Valve: The tricuspid valve is normal in structure. Tricuspid valve regurgitation is trivial. No evidence of tricuspid stenosis. Aortic Valve: The aortic valve is normal in structure. Aortic valve regurgitation is not visualized. No aortic stenosis is present. Pulmonic Valve: The pulmonic valve was normal in structure. Pulmonic valve regurgitation is not visualized. No evidence of pulmonic stenosis. Aorta: The aortic root and ascending aorta are structurally normal, with no evidence of dilitation. IAS/Shunts: The atrial septum is grossly normal.  LEFT VENTRICLE PLAX 2D LVIDd:         4.15 cm  Diastology LVIDs:         2.80 cm  LV e' lateral:   11.25 cm/s LV PW:         1.25 cm  LV E/e' lateral: 4.9 LV IVS:        1.30 cm  LV e' medial:    5.33 cm/s LVOT diam:     2.30 cm  LV E/e' medial:  10.3 LV SV:         43 LV SV Index:   22 LVOT Area:     4.15 cm  LEFT ATRIUM           Index       RIGHT ATRIUM           Index LA diam:  4.30 cm 2.26 cm/m  RA Area:     27.40 cm LA Vol (A4C): 46.6 ml 24.44 ml/m RA Volume:   101.00 ml 52.97 ml/m  AORTIC VALVE LVOT Vmax:   67.20 cm/s LVOT Vmean:  54.800 cm/s LVOT VTI:    0.103 m  AORTA Ao Root diam: 3.50 cm MITRAL VALVE MV Area (PHT): 3.77 cm    SHUNTS MV Decel Time: 201 msec    Systemic VTI:  0.10 m MV E velocity: 54.80 cm/s  Systemic Diam: 2.30 cm MV A velocity: 45.80 cm/s MV E/A ratio:  1.20 Mertie Moores MD Electronically signed by Mertie Moores MD Signature Date/Time: 01/20/2020/3:16:40 PM    Final     Micro  Results     No results found for this or any previous visit (from the past 240 hour(s)).     Today   Subjective:   Lawrence Wells today has no headache,no chest abdominal pain,no new weakness tingling or numbness, feels much better wants to go home today.   Objective:   Blood pressure 101/70, pulse (!) 58, temperature 97.8 F (36.6 C), temperature source Oral, resp. rate 16, height 5\' 11"  (1.803 m), weight 71.6 kg, SpO2 98 %.   Intake/Output Summary (Last 24 hours) at 01/23/2020 0959 Last data filed at 01/22/2020 1200 Gross per 24 hour  Intake 340 ml  Output --  Net 340 ml    Exam Awake Alert, Oriented x 3, No new F.N deficits, Normal affect [place].AT,PERRAL Supple Neck,No JVD, No cervical lymphadenopathy appriciated.  Symmetrical Chest wall movement, Good air movement bilaterally, diffuse rhonchi bilaterally RRR,No Gallops,Rubs or new Murmurs, No Parasternal Heave +ve B.Sounds, Abd Soft, Non tender, No organomegaly appriciated, No rebound -guarding or rigidity. No Cyanosis, Clubbing or edema, No new Rash or bruise  Data Review   CBC w Diff:  Lab Results  Component Value Date   WBC 19.6 (H) 01/23/2020   HGB 13.1 01/23/2020   HCT 39.5 01/23/2020   PLT 472 (H) 01/23/2020   LYMPHOPCT 12 01/22/2020   MONOPCT 3 01/22/2020   EOSPCT 0 01/22/2020   BASOPCT 0 01/22/2020    CMP:  Lab Results  Component Value Date   NA 136 01/23/2020   K 4.7 01/23/2020   CL 98 01/23/2020   CO2 28 01/23/2020   BUN 36 (H) 01/23/2020   CREATININE 0.85 01/23/2020   CREATININE 0.84 11/18/2019   PROT 5.8 (L) 01/23/2020   ALBUMIN 2.6 (L) 01/23/2020   BILITOT 0.2 (L) 01/23/2020   ALKPHOS 78 01/23/2020   AST 33 01/23/2020   ALT 41 01/23/2020  .   Total Time in preparing paper work, data evaluation and todays exam - 5 minutes  Phillips Climes M.D on 01/23/2020 at 9:59 AM  [facility]   Office  [phone]

## 2020-01-23 NOTE — Progress Notes (Signed)
D/c instructions provided to pt. He verbalizes understanding. He has home oxygen tank at bedside and will use this to get home. All oxygen in the home is already set up prior to admission as he is on it at home. There are not questions at this time. Wife called and is in route.

## 2020-01-24 ENCOUNTER — Encounter: Payer: Self-pay | Admitting: *Deleted

## 2020-01-24 ENCOUNTER — Telehealth: Payer: Self-pay | Admitting: *Deleted

## 2020-01-24 DIAGNOSIS — J849 Interstitial pulmonary disease, unspecified: Secondary | ICD-10-CM

## 2020-01-24 NOTE — Telephone Encounter (Signed)
Called to check on pt. He was discharged yesterday from hospital.  Pt will ned to stay out until 6/21, should return on 6/23.

## 2020-01-26 ENCOUNTER — Other Ambulatory Visit: Payer: PPO

## 2020-02-01 DIAGNOSIS — Z7682 Awaiting organ transplant status: Secondary | ICD-10-CM | POA: Diagnosis not present

## 2020-02-01 DIAGNOSIS — Z01818 Encounter for other preprocedural examination: Secondary | ICD-10-CM | POA: Diagnosis not present

## 2020-02-01 DIAGNOSIS — K449 Diaphragmatic hernia without obstruction or gangrene: Secondary | ICD-10-CM | POA: Diagnosis not present

## 2020-02-01 DIAGNOSIS — K224 Dyskinesia of esophagus: Secondary | ICD-10-CM | POA: Diagnosis not present

## 2020-02-01 DIAGNOSIS — I081 Rheumatic disorders of both mitral and tricuspid valves: Secondary | ICD-10-CM | POA: Diagnosis not present

## 2020-02-02 ENCOUNTER — Encounter: Payer: PPO | Attending: Internal Medicine

## 2020-02-02 ENCOUNTER — Encounter: Payer: Self-pay | Admitting: *Deleted

## 2020-02-02 DIAGNOSIS — J849 Interstitial pulmonary disease, unspecified: Secondary | ICD-10-CM | POA: Diagnosis not present

## 2020-02-02 DIAGNOSIS — Z79899 Other long term (current) drug therapy: Secondary | ICD-10-CM | POA: Insufficient documentation

## 2020-02-02 DIAGNOSIS — I7300 Raynaud's syndrome without gangrene: Secondary | ICD-10-CM | POA: Diagnosis not present

## 2020-02-02 DIAGNOSIS — Z7682 Awaiting organ transplant status: Secondary | ICD-10-CM | POA: Diagnosis not present

## 2020-02-02 DIAGNOSIS — M0579 Rheumatoid arthritis with rheumatoid factor of multiple sites without organ or systems involvement: Secondary | ICD-10-CM | POA: Diagnosis not present

## 2020-02-02 NOTE — Progress Notes (Signed)
Pulmonary Individual Treatment Plan  Patient Details  Name: Lawrence Wells MRN: [id number] Date of Birth: March 21, 1956 Referring Provider:     Pulmonary Rehab from 10/28/2019 in [facility] Cardiac and Pulmonary Rehab  Referring Provider Brand Males MD      Initial Encounter Date:    Pulmonary Rehab from 10/28/2019 in [facility] Cardiac and Pulmonary Rehab  Date 10/28/19      Visit Diagnosis: ILD (interstitial lung disease) ([place])  Patient's Home Medications on Admission:  Current Outpatient Medications:  .  apixaban (ELIQUIS) 5 MG TABS tablet, Take 1 tablet (5 mg total) by mouth 2 (two) times daily., Disp: 60 tablet, Rfl: 0 .  azaTHIOprine (IMURAN) 50 MG tablet, Take 2 tablets (100 mg total) by mouth daily., Disp: 180 tablet, Rfl: 0 .  diclofenac Sodium (VOLTAREN) 1 % GEL, Apply 2 g topically 3 (three) times daily as needed. (Patient taking differently: Apply 2 g topically 3 (three) times daily as needed (Pain (Arthritis)). ), Disp: 100 g, Rfl: 2 .  ezetimibe (ZETIA) 10 MG tablet, Take 1 tablet (10 mg total) by mouth daily., Disp: 90 tablet, Rfl: 3 .  gabapentin (NEURONTIN) 100 MG capsule, Start 1 capsule daily, increase by 1 cap every 2-3 days as tolerated up to 3 times a day, or may take 3 at once in evening. (Patient taking differently: Take 200 mg by mouth 2 (two) times daily. ), Disp: 90 capsule, Rfl: 3 .  Multiple Vitamin (MULTIVITAMIN) tablet, Take 1 tablet by mouth daily., Disp: , Rfl:  .  Nintedanib (OFEV) 150 MG CAPS, Please take 1 tablet oral daily for 1 week, then resume back to 1 tablet twice daily after  that, Disp: 60 capsule, Rfl: 11 .  OXYGEN, Inhale into the lungs as needed., Disp: , Rfl:  .  pantoprazole (PROTONIX) 40 MG tablet, Take 1 tablet (40 mg total) by mouth daily., Disp: 30 tablet, Rfl: 1 .  predniSONE (DELTASONE) 10 MG tablet, Take 60 mg daily for 5 days, then 40 mg oral daily for 5 days, then 20 mg oral daily for 5 days, then 10 mg oral daily for 5 days., Disp: 65  tablet, Rfl: 0 .  Tiotropium Bromide Monohydrate (SPIRIVA RESPIMAT) 2.5 MCG/ACT AERS, Inhale 2 puffs into the lungs daily., Disp: 1 g, Rfl: 5  Past Medical History: Past Medical History:  Diagnosis Date  . Arthritis   . COPD (chronic obstructive pulmonary disease) ([place])   . Coronary artery disease   . Dyspnea   . Emphysema lung ([place])   . Hyperlipidemia   . Pulmonary filariasis     Tobacco Use: Social History   Tobacco Use  Smoking Status Former Smoker  . Packs/day: 1.50  . Years: 30.00  . Pack years: 45.00  . Types: Cigarettes  . Quit date: 2007  . Years since quitting: 14.4  Smokeless Tobacco Former Systems developer  . Types: Chew  . Quit date: 2007  Tobacco Comment   Dip smokeless tobacco >20-30 years    Labs: Recent Review Flowsheet Data    Labs for ITP Cardiac and Pulmonary Rehab Latest Ref Rng & Units 05/07/2017 09/02/2019   Cholestrol <200 mg/dL 259(H) 210(H)   LDLCALC mg/dL (calc) 156(H) 134(H)   HDL > OR = 40 mg/dL 76 51   Trlycerides <150 mg/dL 138 139   Hemoglobin A1c <5.7 % of total Hgb 5.5 5.7(H)       Pulmonary Assessment Scores:  Pulmonary Assessment Scores    Row Name 10/27/19 1117  ADL UCSD   ADL Phase Entry     SOB Score total 41     Rest 0     Walk 3     Stairs 4     Bath 2     Dress 2     Shop 1       CAT Score   CAT Score 15            UCSD: Self-administered rating of dyspnea associated with activities of daily living (ADLs) 6-point scale (0 = "not at all" to 5 = "maximal or unable to do because of breathlessness")  Scoring Scores range from 0 to 120.  Minimally important difference is 5 units  CAT: CAT can identify the health impairment of COPD patients and is better correlated with disease progression.  CAT has a scoring range of zero to 40. The CAT score is classified into four groups of low (less than 10), medium (10 - 20), high (21-30) and very high (31-40) based on the impact level of disease on health status. A CAT score  over 10 suggests significant symptoms.  A worsening CAT score could be explained by an exacerbation, poor medication adherence, poor inhaler technique, or progression of COPD or comorbid conditions.  CAT MCID is 2 points  mMRC: mMRC (Modified Medical Research Council) Dyspnea Scale is used to assess the degree of baseline functional disability in patients of respiratory disease due to dyspnea. No minimal important difference is established. A decrease in score of 1 point or greater is considered a positive change.   Pulmonary Function Assessment:  Pulmonary Function Assessment - 10/27/19 1118      Breath   Shortness of Breath Yes;Limiting activity           Exercise Target Goals: Exercise Program Goal: Individual exercise prescription set using results from initial 6 min walk test and THRR while considering  patient's activity barriers and safety.   Exercise Prescription Goal: Initial exercise prescription builds to 30-45 minutes a day of aerobic activity, 2-3 days per week.  Home exercise guidelines will be given to patient during program as part of exercise prescription that the participant will acknowledge.  Education: Aerobic Exercise & Resistance Training: - Gives group verbal and written instruction on the various components of exercise. Focuses on aerobic and resistive training programs and the benefits of this training and how to safely progress through these programs..   Education: Exercise & Equipment Safety: - Individual verbal instruction and demonstration of equipment use and safety with use of the equipment.   Pulmonary Rehab from 10/28/2019 in [facility] Cardiac and Pulmonary Rehab  Date 10/28/19  Educator [facility]  Instruction Review Code 1- Verbalizes Understanding      Education: Exercise Physiology & General Exercise Guidelines: - Group verbal and written instruction with models to review the exercise physiology of the cardiovascular system and associated critical values.  Provides general exercise guidelines with specific guidelines to those with heart or lung disease.    Education: Flexibility, Balance, Mind/Body Relaxation: Provides group verbal/written instruction on the benefits of flexibility and balance training, including mind/body exercise modes such as yoga, pilates and tai chi.  Demonstration and skill practice provided.   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 HR 95 bpm     Max Ex. BP 146/72     2 Minute Post BP 134/70       Interval HR   1 Minute HR 82     2 Minute HR 89     3 Minute HR 89     4 Minute HR 95     5 Minute HR 92     6 Minute HR 93     2 Minute Post HR 67     Interval Heart Rate? Yes       Interval Oxygen   Interval Oxygen? Yes     Baseline Oxygen Saturation % 95 %     1 Minute Oxygen Saturation % 89 %     1 Minute Liters of Oxygen 0 L  Room Air     2 Minute Oxygen Saturation % 86 %     2 Minute Liters of Oxygen 0 L     3 Minute Oxygen Saturation % 82 %     3 Minute Liters of Oxygen 0 L     4 Minute Oxygen Saturation % 81 %     4 Minute Liters of Oxygen 0 L     5 Minute Oxygen Saturation % 80 %     5 Minute Liters of Oxygen 0 L     6 Minute Oxygen Saturation %  79 %     6 Minute Liters of Oxygen 0 L     2 Minute Post Oxygen Saturation % 91 %     2 Minute Post Liters of Oxygen 0 L           Oxygen Initial Assessment:  Oxygen Initial Assessment - 11/03/19 0835      Home Oxygen   Home Oxygen Device Home Concentrator;E-Tanks    Sleep Oxygen Prescription Continuous    Liters per minute 2    Home Exercise Oxygen Prescription Continuous    Liters per minute 2    Home at Rest Exercise Oxygen Prescription None    Liters per minute 0    Compliance with Home Oxygen Use Yes           Oxygen Re-Evaluation:  Oxygen Re-Evaluation    Row Name 11/03/19 0843 11/25/19 0742 12/29/19 0758         Program Oxygen Prescription   Program Oxygen Prescription E-Tanks;Continuous E-Tanks;Continuous E-Tanks;Continuous     Liters per minute '2 3 3     ' Comments Required oxygen 2 L  while exercising Required oxygen 3 L  while exercising Required oxygen 3 L  while exercising       Home Oxygen   Home Oxygen Device Home Concentrator;E-Tanks Home Concentrator;E-Tanks Home Concentrator;E-Tanks  Sleep Oxygen Prescription Continuous Continuous Continuous     Liters per minute 2 2  may use 3L when worked hard during the day (a couple times a week) 2     Home Exercise Oxygen Prescription Continuous Continuous Continuous     Liters per minute '2 3 3     ' Home at Rest Exercise Oxygen Prescription None -- None     Liters per minute 0 0 0     Compliance with Home Oxygen Use Yes Yes Yes       Goals/Expected Outcomes   Short Term Goals To learn and exhibit compliance with exercise, home and travel O2 prescription;To learn and understand importance of monitoring SPO2 with pulse oximeter and demonstrate accurate use of the pulse oximeter.;To learn and understand importance of maintaining oxygen saturations>88%;To learn and demonstrate proper pursed lip breathing techniques or other breathing techniques. To learn and exhibit compliance with exercise, home and travel O2  prescription;To learn and understand importance of monitoring SPO2 with pulse oximeter and demonstrate accurate use of the pulse oximeter.;To learn and understand importance of maintaining oxygen saturations>88%;To learn and demonstrate proper pursed lip breathing techniques or other breathing techniques. To learn and exhibit compliance with exercise, home and travel O2 prescription;To learn and understand importance of monitoring SPO2 with pulse oximeter and demonstrate accurate use of the pulse oximeter.;To learn and understand importance of maintaining oxygen saturations>88%;To learn and demonstrate proper pursed lip breathing techniques or other breathing techniques.     Long  Term Goals Exhibits compliance with exercise, home and travel O2 prescription;Verbalizes importance of monitoring SPO2 with pulse oximeter and return demonstration;Maintenance of O2 saturations>88%;Exhibits proper breathing techniques, such as pursed lip breathing or other method taught during program session Exhibits compliance with exercise, home and travel O2 prescription;Verbalizes importance of monitoring SPO2 with pulse oximeter and return demonstration;Maintenance of O2 saturations>88%;Exhibits proper breathing techniques, such as pursed lip breathing or other method taught during program session Exhibits compliance with exercise, home and travel O2 prescription;Verbalizes importance of monitoring SPO2 with pulse oximeter and return demonstration;Maintenance of O2 saturations>88%;Exhibits proper breathing techniques, such as pursed lip breathing or other method taught during program session     Comments Reviewed PLB technique with pt.  Talked about how it works and it's importance in maintaining their exercise saturations. Reviewed PLB technique with pt.  Talked about how it works and it's importance in maintaining their exercise saturations. [place] checks 02 at home.  He will be evaluated at [facility] for transplant later this  month     Goals/Expected Outcomes Short: Become more profiecient at using PLB.   Long: Become independent at using PLB. Short: Become more profiecient at using PLB.   Long: Become independent at using PLB. Short:  continue to use PLB and monitor 02 at home Long:  maintian 02 at optimal levels            Oxygen Discharge (Final Oxygen Re-Evaluation):  Oxygen Re-Evaluation - 12/29/19 0758      Program Oxygen Prescription   Program Oxygen Prescription E-Tanks;Continuous    Liters per minute 3    Comments Required oxygen 3 L  while exercising      Home Oxygen   Home Oxygen Device Home Concentrator;E-Tanks    Sleep Oxygen Prescription Continuous    Liters per minute 2    Home Exercise Oxygen Prescription Continuous    Liters per minute 3    Home at Rest Exercise Oxygen Prescription None    Liters per minute 0  Compliance with Home Oxygen Use Yes      Goals/Expected Outcomes   Short Term Goals To learn and exhibit compliance with exercise, home and travel O2 prescription;To learn and understand importance of monitoring SPO2 with pulse oximeter and demonstrate accurate use of the pulse oximeter.;To learn and understand importance of maintaining oxygen saturations>88%;To learn and demonstrate proper pursed lip breathing techniques or other breathing techniques.    Long  Term Goals Exhibits compliance with exercise, home and travel O2 prescription;Verbalizes importance of monitoring SPO2 with pulse oximeter and return demonstration;Maintenance of O2 saturations>88%;Exhibits proper breathing techniques, such as pursed lip breathing or other method taught during program session    Comments [place] checks 02 at home.  He will be evaluated at [facility] for transplant later this month    Goals/Expected Outcomes Short:  continue to use PLB and monitor 02 at home Long:  maintian 02 at optimal levels           Initial Exercise Prescription:  Initial Exercise Prescription - 10/28/19 1100      Date of  Initial Exercise RX and Referring Provider   Date 10/28/19    Referring Provider Brand Males MD      Oxygen   Oxygen Continuous    Liters 2      Treadmill   MPH 2.4    Grade 0.5    Minutes 15    METs 3      NuStep   Level 3    SPM 80    Minutes 15    METs 3      Elliptical   Level 1    Speed 3    Minutes 15    METs 3      Biostep-RELP   Level 3    SPM 50    Minutes 15    METs 3      Prescription Details   Frequency (times per week) 2    Duration Progress to 30 minutes of continuous aerobic without signs/symptoms of physical distress      Intensity   THRR 40-80% of Max Heartrate 98-138    Ratings of Perceived Exertion 11-13    Perceived Dyspnea 0-4      Progression   Progression Continue to progress workloads to maintain intensity without signs/symptoms of physical distress.      Resistance Training   Training Prescription Yes    Weight 5 lb    Reps 10-15           Perform Capillary Blood Glucose checks as needed.  Exercise Prescription Changes:  Exercise Prescription Changes    Row Name 10/28/19 1100 11/10/19 0900 11/23/19 1500 12/08/19 1100 12/09/19 0800     Response to Exercise   Blood Pressure (Admit) 130/74 110/68 104/60 128/70 --   Blood Pressure (Exercise) 146/72 124/78 134/74 152/74 --   Blood Pressure (Exit) 132/70 104/60 106/60 126/74 --   Heart Rate (Admit) 60 bpm 69 bpm 70 bpm 69 bpm --   Heart Rate (Exercise) 95 bpm 90 bpm 97 bpm 98 bpm --   Heart Rate (Exit) 62 bpm 74 bpm 74 bpm 85 bpm --   Oxygen Saturation (Admit) 95 % 91 % 98 % 97 % --   Oxygen Saturation (Exercise) 79 % 92 % 82 % 89 % --   Oxygen Saturation (Exit) 95 % 94 % 98 % 97 % --   Rating of Perceived Exertion (Exercise) '7 11 13 13 ' --   Perceived Dyspnea (Exercise) 1 3 3  2 --   Symptoms none SOB SOB SOB --   Comments walk test results -- -- -- --   Duration -- Continue with 30 min of aerobic exercise without signs/symptoms of physical distress. Continue with 30 min  of aerobic exercise without signs/symptoms of physical distress. Continue with 30 min of aerobic exercise without signs/symptoms of physical distress. --   Intensity -- THRR unchanged THRR unchanged THRR unchanged --     Progression   Progression -- Continue to progress workloads to maintain intensity without signs/symptoms of physical distress. Continue to progress workloads to maintain intensity without signs/symptoms of physical distress. Continue to progress workloads to maintain intensity without signs/symptoms of physical distress. --   Average METs -- 3 4  4L on TM  3.68 --     Resistance Training   Training Prescription -- Yes Yes Yes --   Weight -- 5 lb 5 lb 5 lb --   Reps -- 10-15 10-15 10-15 --     Interval Training   Interval Training -- No No No --     Oxygen   Oxygen -- Continuous Continuous Continuous --   Liters -- 2-3 3-4 3-4 --     Treadmill   MPH -- 2.4 2.4 2.8 --   Grade -- 0.'5 1 1 ' --   Minutes -- '15 15 15 ' --   METs -- 3 3.17 3.53 --     NuStep   Level -- -- -- 4 --   Minutes -- -- -- 15 --   METs -- -- -- 3.5 --     REL-XR   Level -- '3 4 6 ' --   Minutes -- '15 15 15 ' --     Biostep-RELP   Level -- -- -- 3 --   Minutes -- -- -- 15 --   METs -- -- -- 4 --     Home Exercise Plan   Plans to continue exercise at -- -- -- -- [employer] (comment)  Neurosurgeon and walking   Frequency -- -- -- -- Add 2 additional days to program exercise sessions.   Initial Home Exercises Provided -- -- -- -- 12/09/19   Row Name 12/20/19 1400             Response to Exercise   Blood Pressure (Admit) 94/60       Blood Pressure (Exercise) 128/72       Blood Pressure (Exit) 92/62       Heart Rate (Admit) 71 bpm       Heart Rate (Exercise) 98 bpm       Heart Rate (Exit) 74 bpm       Oxygen Saturation (Admit) 97 %       Oxygen Saturation (Exercise) 83 %       Oxygen Saturation (Exit) 95 %       Rating of Perceived Exertion (Exercise) 12       Duration  Continue with 30 min of aerobic exercise without signs/symptoms of physical distress.       Intensity THRR unchanged         Progression   Progression Continue to progress workloads to maintain intensity without signs/symptoms of physical distress.       Average METs 3.25         Resistance Training   Training Prescription Yes       Weight 5 lb       Reps 10-15         Interval Training  Interval Training No         Oxygen   Oxygen Continuous       Liters 3-4         Treadmill   MPH 2.8       Grade 1       Minutes 15       METs 3.53         REL-XR   Level 6       Watts 50       Minutes 15              Exercise Comments:  Exercise Comments    Row Name 11/03/19 0829 11/03/19 0830 12/01/19 0843 12/08/19 0750 12/16/19 6073   Exercise Comments First full day of exercise!  Patient was oriented to gym and equipment including functions, settings, policies, and procedures.  Patient's individual exercise prescription and treatment plan were reviewed.  All starting workloads were established based on the results of the 6 minute walk test done at initial orientation visit.  The plan for exercise progression was also introduced and progression will be customized based on patient's performance and goals. Dropped to 80% on TM  Placed  2l per nasal canuula oxygen on pJohn   Sats back up above 90% Today while on the TM, sats dropped to 85%. Cued PLB and decreased Workload to get sat to 88% O2 Sat  Dropping to 85% today   Oxygen increased to 4 L Adjusting oyygen while on TM for sat lower than 88%. Change did bring sats up to 93%          Exercise Goals and Review:  Exercise Goals    Row Name 10/28/19 1144             Exercise Goals   Increase Physical Activity Yes       Intervention Provide advice, education, support and counseling about physical activity/exercise needs.;Develop an individualized exercise prescription for aerobic and resistive training based on initial evaluation  findings, risk stratification, comorbidities and participant's personal goals.       Expected Outcomes Short Term: Attend rehab on a regular basis to increase amount of physical activity.;Long Term: Add in home exercise to make exercise part of routine and to increase amount of physical activity.;Long Term: Exercising regularly at least 3-5 days a week.       Increase Strength and Stamina Yes       Intervention Provide advice, education, support and counseling about physical activity/exercise needs.;Develop an individualized exercise prescription for aerobic and resistive training based on initial evaluation findings, risk stratification, comorbidities and participant's personal goals.       Expected Outcomes Short Term: Increase workloads from initial exercise prescription for resistance, speed, and METs.;Short Term: Perform resistance training exercises routinely during rehab and add in resistance training at home;Long Term: Improve cardiorespiratory fitness, muscular endurance and strength as measured by increased METs and functional capacity (6MWT)       Able to understand and use rate of perceived exertion (RPE) scale Yes       Intervention Provide education and explanation on how to use RPE scale       Expected Outcomes Short Term: Able to use RPE daily in rehab to express subjective intensity level;Long Term:  Able to use RPE to guide intensity level when exercising independently       Able to understand and use Dyspnea scale Yes       Intervention Provide education and explanation on how to  use Dyspnea scale       Expected Outcomes Short Term: Able to use Dyspnea scale daily in rehab to express subjective sense of shortness of breath during exertion;Long Term: Able to use Dyspnea scale to guide intensity level when exercising independently       Knowledge and understanding of Target Heart Rate Range (THRR) Yes       Intervention Provide education and explanation of THRR including how the numbers  were predicted and where they are located for reference       Expected Outcomes Short Term: Able to state/look up THRR;Short Term: Able to use daily as guideline for intensity in rehab;Long Term: Able to use THRR to govern intensity when exercising independently       Able to check pulse independently Yes       Intervention Provide education and demonstration on how to check pulse in carotid and radial arteries.;Review the importance of being able to check your own pulse for safety during independent exercise       Expected Outcomes Short Term: Able to explain why pulse checking is important during independent exercise;Long Term: Able to check pulse independently and accurately       Understanding of Exercise Prescription Yes       Intervention Provide education, explanation, and written materials on patient's individual exercise prescription       Expected Outcomes Long Term: Able to explain home exercise prescription to exercise independently;Short Term: Able to explain program exercise prescription              Exercise Goals Re-Evaluation :  Exercise Goals Re-Evaluation    Row Name 11/03/19 0834 11/10/19 0909 11/23/19 1535 11/25/19 0748 12/08/19 1006     Exercise Goal Re-Evaluation   Exercise Goals Review Knowledge and understanding of Target Heart Rate Range (THRR);Able to understand and use Dyspnea scale;Able to understand and use rate of perceived exertion (RPE) scale;Understanding of Exercise Prescription Increase Physical Activity;Increase Strength and Stamina;Understanding of Exercise Prescription Increase Physical Activity;Increase Strength and Stamina;Able to understand and use rate of perceived exertion (RPE) scale;Able to understand and use Dyspnea scale;Knowledge and understanding of Target Heart Rate Range (THRR);Able to check pulse independently;Understanding of Exercise Prescription Increase Physical Activity;Increase Strength and Stamina;Able to understand and use rate of  perceived exertion (RPE) scale;Able to understand and use Dyspnea scale;Knowledge and understanding of Target Heart Rate Range (THRR);Able to check pulse independently;Understanding of Exercise Prescription Increase Physical Activity;Increase Strength and Stamina;Understanding of Exercise Prescription   Comments Reviewed RPE scale, THR and program prescription with pt today.  Pt voiced understanding and was given a copy of goals to take home. Kingson is off to a good start in rehab.  He is exercising on 2-3L to maintain his oxygen saturations. He has completed his first three full days of exercise.  We will continue to monitor his progress. Dahmir had to use 4L on TM due to low O2.  He did increase incline to 1%.  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 Pt will rest when he gets SOB at work because does not wear oxygen. Adante has been doing well in rehab.  He is now up to level 6 on the XR and his sats are doing well too.  We will continue to monitor his progression.   Expected Outcomes Short: Use RPE daily to regulate intensity. Long: Follow program prescription in THR.  Short: Continue to attend rehab regularly  Long: Continue to improve stamina and maintain saturations. Short:  exercise consistently and keep O2 above 88%  Long: improve MET level Short:  exercise consistently and keep O2 above 88%  Long: improve MET level Short: Continue to monitor O2 closely  Long: Continue to improve stamina.   [place] Name 12/09/19 8115 12/20/19 1405           Exercise Goal Re-Evaluation   Exercise Goals Review Increase Physical Activity;Understanding of Exercise Prescription;Able to understand and use rate of perceived exertion (RPE) scale;Increase Strength and Stamina;Able to understand and use Dyspnea scale;Knowledge and understanding of Target Heart Rate Range (THRR);Able to check pulse independently Increase Physical  Activity;Increase Strength and Stamina;Able to understand and use rate of perceived exertion (RPE) scale;Able to understand and use Dyspnea scale;Knowledge and understanding of Target Heart Rate Range (THRR);Able to check pulse independently;Understanding of Exercise Prescription      Comments Reviewed home exercise with pt today.  Pt plans to walk and go to [employer] for exercise.  Reviewed THR, pulse, RPE, sign and symptoms, NTG use, and when to call 911 or MD.  Also discussed weather considerations and indoor options.  Pt voiced understanding. Conor is doing well in rehab.  He does have to use 4L on some pieces of equipment to keep sats above 88%.      Expected Outcomes Short: Start to add in more exercise at home.  Long; Continue to improve stamina. Short:  monitor oxygen while exercising Long: continue to build stamina             Discharge Exercise Prescription (Final Exercise Prescription Changes):  Exercise Prescription Changes - 12/20/19 1400      Response to Exercise   Blood Pressure (Admit) 94/60    Blood Pressure (Exercise) 128/72    Blood Pressure (Exit) 92/62    Heart Rate (Admit) 71 bpm    Heart Rate (Exercise) 98 bpm    Heart Rate (Exit) 74 bpm    Oxygen Saturation (Admit) 97 %    Oxygen Saturation (Exercise) 83 %    Oxygen Saturation (Exit) 95 %    Rating of Perceived Exertion (Exercise) 12    Duration Continue with 30 min of aerobic exercise without signs/symptoms of physical distress.    Intensity THRR unchanged      Progression   Progression Continue to progress workloads to maintain intensity without signs/symptoms of physical distress.    Average METs 3.25      Resistance Training   Training Prescription Yes    Weight 5 lb    Reps 10-15      Interval Training   Interval Training No      Oxygen   Oxygen Continuous    Liters 3-4      Treadmill   MPH 2.8    Grade 1    Minutes 15    METs 3.53      REL-XR   Level 6    Watts 50    Minutes 15            Nutrition:  Target Goals: Understanding of nutrition guidelines, daily intake of sodium <1542m, cholesterol <2077m calories 30% from fat and 7% or less from saturated fats, daily to have 5 or more servings of fruits and vegetables.  Education: Controlling Sodium/Reading Food Labels -Group verbal and written material supporting the discussion of sodium use in heart healthy nutrition. Review and explanation with models, verbal and written materials for utilization  of the food label.   Education: General Nutrition Guidelines/Fats and Fiber: -Group instruction provided by verbal, written material, models and posters to present the general guidelines for heart healthy nutrition. Gives an explanation and review of dietary fats and fiber.   Biometrics:  Pre Biometrics - 10/28/19 1145      Pre Biometrics   Height 5' 10.75" (1.797 m)    Weight 168 lb 14.4 oz (76.6 kg)    BMI (Calculated) 23.73    Single Leg Stand 30 seconds            Nutrition Therapy Plan and Nutrition Goals:  Nutrition Therapy & Goals - 12/09/19 1302      Nutrition Therapy   Diet Low Na, HH diet    Protein (specify units) 75-80g    Fiber 30 grams    Whole Grain Foods 3 servings    Saturated Fats 12 max. grams    Fruits and Vegetables 5 servings/day    Sodium 1.5 grams      Personal Nutrition Goals   Nutrition Goal ST: add protein shake either two 10g or one 20 g; optimize by having after workoutLT: Healthy as long as possible    Comments Shredded mini wheat, cheerios. Ham or bolonga sandwiches with fruit. Pretzles or chips. Meat with two vegetables. Airfryer. Eats fish. Discussed Pulmonary MNT and general HH eating.      Intervention Plan   Intervention Prescribe, educate and counsel regarding individualized specific dietary modifications aiming towards targeted core components such as weight, hypertension, lipid management, diabetes, heart failure and other comorbidities.;Nutrition handout(s) given  to patient.    Expected Outcomes Short Term Goal: Understand basic principles of dietary content, such as calories, fat, sodium, cholesterol and nutrients.;Short Term Goal: A plan has been developed with personal nutrition goals set during dietitian appointment.;Long Term Goal: Adherence to prescribed nutrition plan.           Nutrition Assessments:  Nutrition Assessments - 10/27/19 1118      MEDFICTS Scores   Pre Score 23           MEDIFICTS Score Key:          ?70 Need to make dietary changes          40-70 Heart Healthy Diet         ? 40 Therapeutic Level Cholesterol Diet  Nutrition Goals Re-Evaluation:   Nutrition Goals Discharge (Final Nutrition Goals Re-Evaluation):   Psychosocial: Target Goals: Acknowledge presence or absence of significant depression and/or stress, maximize coping skills, provide positive support system. Participant is able to verbalize types and ability to use techniques and skills needed for reducing stress and depression.   Education: Depression - Provides group verbal and written instruction on the correlation between heart/lung disease and depressed mood, treatment options, and the stigmas associated with seeking treatment.   Education: Sleep Hygiene -Provides group verbal and written instruction about how sleep can affect your health.  Define sleep hygiene, discuss sleep cycles and impact of sleep habits. Review good sleep hygiene tips.    Education: Stress and Anxiety: - Provides group verbal and written instruction about the health risks of elevated stress and causes of high stress.  Discuss the correlation between heart/lung disease and anxiety and treatment options. Review healthy ways to manage with stress and anxiety.   Initial Review & Psychosocial Screening:  Initial Psych Review & Screening - 10/27/19 0846      Initial Review   Current issues with Current Stress Concerns  Source of Stress Concerns Chronic Illness;Unable to  participate in former interests or hobbies;Unable to perform yard/household activities    Comments Pulmonary Fibrosis and Arthritis keep him from doing everything that he wants      [place]? Yes   both have several children, grandkids, neighbors     Barriers   Psychosocial barriers to participate in program The patient should benefit from training in stress management and relaxation.;Psychosocial barriers identified (see note)      Screening Interventions   Interventions Encouraged to exercise;To provide support and resources with identified psychosocial needs;Provide feedback about the scores to participant    Expected Outcomes Short Term goal: Utilizing psychosocial counselor, staff and physician to assist with identification of specific Stressors or current issues interfering with healing process. Setting desired goal for each stressor or current issue identified.;Long Term Goal: Stressors or current issues are controlled or eliminated.;Short Term goal: Identification and review with participant of any Quality of Life or Depression concerns found by scoring the questionnaire.;Long Term goal: The participant improves quality of Life and PHQ9 Scores as seen by post scores and/or verbalization of changes           Quality of Life Scores:  Scores of 19 and below usually indicate a poorer quality of life in these areas.  A difference of  2-3 points is a clinically meaningful difference.  A difference of 2-3 points in the total score of the Quality of Life Index has been associated with significant improvement in overall quality of life, self-image, physical symptoms, and general health in studies assessing change in quality of life.  PHQ-9: Recent Review Flowsheet Data    Depression screen [facility] 2/9 12/08/2019 12/01/2019 10/28/2019 09/08/2019 07/19/2019   Decreased Interest 0 0 1 0 0   Down, Depressed, Hopeless 1 0 0 0 0   PHQ - 2 Score 1 0 1 0 0   Altered sleeping '1   1 2 ' - -   Tired, decreased energy '1 1 1 ' - -   Change in appetite 0 0 0 - -   Feeling bad or failure about yourself  0 1 1 - -   Trouble concentrating 0 0 0 - -   Moving slowly or fidgety/restless '1 1 1 ' - -   Suicidal thoughts 0 0 2  - -   PHQ-9 Score '4 4 8 ' - -   Difficult doing work/chores Not difficult at all - Not difficult at all - -     Interpretation of Total Score  Total Score Depression Severity:  1-4 = Minimal depression, 5-9 = Mild depression, 10-14 = Moderate depression, 15-19 = Moderately severe depression, 20-27 = Severe depression   Psychosocial Evaluation and Intervention:  Psychosocial Evaluation - 10/27/19 0855      Psychosocial Evaluation & Interventions   Interventions Stress management education;Encouraged to exercise with the program and follow exercise prescription    Comments Yanuel 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  He has always been an avid exerciser at [employer] prior to the pandemic last year.  He has rhuematoid arthritis all over which also limits his ability to do things and keeps him in chronic pain.  He has a strong support system in his wife.  He is coming to the program after his doctor recommended that he should be supervised as he gets back into his exercise routine.  He wants to get back to exercise and feel better again.  He wants to be able to breathe and able to do more.    Expected Outcomes Short: Attend rehab to get back into exercise routine.  Long: Continue to improve breathing.    Continue Psychosocial Services  Follow up required by staff           Psychosocial Re-Evaluation:  Psychosocial Re-Evaluation    [place] Name 11/25/19 0744 12/29/19 0756           Psychosocial Re-Evaluation   Current issues with Current Sleep Concerns Current Sleep Concerns;Current Stress Concerns      Comments Pt reports normal sleep is tossing and turning - sometimes will have coughing spells or will get too hot. Pt reports  getting enough sleep at night; 8pm-6am. Josua sleeps well most of the time.  No major stress concerns.      Expected Outcomes Pt will continue to manage stress Short: continue to work on sleep patterns Long:  maintain positive outlook      Interventions Encouraged to attend Pulmonary Rehabilitation for the exercise --      Continue Psychosocial Services  Follow up required by staff --             Psychosocial Discharge (Final Psychosocial Re-Evaluation):  Psychosocial Re-Evaluation - 12/29/19 0756      Psychosocial Re-Evaluation   Current issues with Current Sleep Concerns;Current Stress Concerns    Comments Arbie sleeps well most of the time.  No major stress concerns.    Expected Outcomes Short: continue to work on sleep patterns Long:  maintain positive outlook           Education: Education Goals: Education classes will be provided on a weekly basis, covering required topics. Participant will state understanding/return demonstration of topics presented.  Learning Barriers/Preferences:  Learning Barriers/Preferences - 10/27/19 0842      Learning Barriers/Preferences   Learning Barriers None    Learning Preferences None           General Pulmonary Education Topics:  Infection Prevention: - Provides verbal and written material to individual with discussion of infection control including proper hand washing and proper equipment cleaning during exercise session.   Pulmonary Rehab from 10/28/2019 in [facility]  Date 10/28/19  Educator [facility]  Instruction Review Code 1- Verbalizes Understanding      Falls Prevention: - Provides verbal and written material to individual with discussion of falls prevention and safety.   Pulmonary Rehab from 10/28/2019 in [facility] Pulmonary Rehab  Date 10/28/19  Educator [facility]  Instruction Review Code 1- Verbalizes Understanding      Chronic Lung Diseases: - Group verbal and written instruction to review updates,  respiratory medications, advancements in procedures and treatments. Discuss use of supplemental oxygen including available portable oxygen systems, continuous and intermittent flow rates, concentrators, personal use and safety guidelines. Review proper use of inhaler and spacers. Provide informative websites for self-education.    Energy Conservation: - Provide group verbal and written instruction for methods to conserve energy, plan and organize activities. Instruct on pacing techniques, use of adaptive equipment and posture/positioning to relieve shortness of breath.   Triggers and Exacerbations: - Group verbal and written instruction to review types of environmental triggers and ways to prevent exacerbations. Discuss weather changes, air quality and the benefits of nasal washing. Review warning signs and symptoms to help prevent infections. Discuss techniques for effective airway clearance, coughing, and vibrations.   AED/CPR: - Group verbal and written instruction with the use of models to  demonstrate the basic use of the AED with the basic ABC's of resuscitation.   Anatomy and Physiology of the Lungs: - Group verbal and written instruction with the use of models to provide basic lung anatomy and physiology related to function, structure and complications of lung disease.   Anatomy & Physiology of the Heart: - Group verbal and written instruction and models provide basic cardiac anatomy and physiology, with the coronary electrical and arterial systems. Review of Valvular disease and Heart Failure   Cardiac Medications: - Group verbal and written instruction to review commonly prescribed medications for heart disease. Reviews the medication, class of the drug, and side effects.   Other: -Provides group and verbal instruction on various topics (see comments)   Knowledge Questionnaire Score:  Knowledge Questionnaire Score - 10/27/19 1118      Knowledge Questionnaire Score   Pre  Score 15/18 Education Focus: O2 Safety and Exercise            Core Components/Risk Factors/Patient Goals at Admission:  Personal Goals and Risk Factors at Admission - 10/28/19 1145      Core Components/Risk Factors/Patient Goals on Admission    Weight Management Yes;Weight Maintenance    Intervention Weight Management: Develop a combined nutrition and exercise program designed to reach desired caloric intake, while maintaining appropriate intake of nutrient and fiber, sodium and fats, and appropriate energy expenditure required for the weight goal.;Weight Management: Provide education and appropriate resources to help participant work on and attain dietary goals.    Admit Weight 168 lb 14.4 oz (76.6 kg)    Goal Weight: Short Term 165 lb (74.8 kg)    Goal Weight: Long Term 165 lb (74.8 kg)    Expected Outcomes Short Term: Continue to assess and modify interventions until short term weight is achieved;Long Term: Adherence to nutrition and physical activity/exercise program aimed toward attainment of established weight goal;Weight Maintenance: Understanding of the daily nutrition guidelines, which includes 25-35% calories from fat, 7% or less cal from saturated fats, less than 245m cholesterol, less than 1.5gm of sodium, & 5 or more servings of fruits and vegetables daily    Improve shortness of breath with ADL's Yes    Intervention Provide education, individualized exercise plan and daily activity instruction to help decrease symptoms of SOB with activities of daily living.    Expected Outcomes Short Term: Improve cardiorespiratory fitness to achieve a reduction of symptoms when performing ADLs;Long Term: Be able to perform more ADLs without symptoms or delay the onset of symptoms    Lipids Yes    Intervention Provide education and support for participant on nutrition & aerobic/resistive exercise along with prescribed medications to achieve LDL <716m HDL >4058m   Expected Outcomes Short Term:  Participant states understanding of desired cholesterol values and is compliant with medications prescribed. Participant is following exercise prescription and nutrition guidelines.;Long Term: Cholesterol controlled with medications as prescribed, with individualized exercise RX and with personalized nutrition plan. Value goals: LDL < 93m73mDL > 40 mg.           Education:Diabetes - Individual verbal and written instruction to review signs/symptoms of diabetes, desired ranges of glucose level fasting, after meals and with exercise. Acknowledge that pre and post exercise glucose checks will be done for 3 sessions at entry of program.   Education: Know Your Numbers and Risk Factors: -Group verbal and written instruction about important numbers in your health.  Discussion of what are risk factors and how they play a role in  the disease process.  Review of Cholesterol, Blood Pressure, Diabetes, and BMI and the role they play in your overall health.   Core Components/Risk Factors/Patient Goals Review:   Goals and Risk Factor Review    Row Name 11/25/19 0747 12/29/19 0754           Core Components/Risk Factors/Patient Goals Review   Personal Goals Review Develop more efficient breathing techniques such as purse lipped breathing and diaphragmatic breathing and practicing self-pacing with activity. Develop more efficient breathing techniques such as purse lipped breathing and diaphragmatic breathing and practicing self-pacing with activity.;Improve shortness of breath with ADL's      Review Pt reports not having any significant health concerns. Garvis uses oxygen as needed at home.  He can tell he has improved since he has been exercising.  He uses PLB.  He has arthritis and it takes him a little longer to get going in the morning      Expected Outcomes Continue to manage health and practice PLB. Short:  use PLB to help with ADLs Long:  manage health concerns             Core Components/Risk  Factors/Patient Goals at Discharge (Final Review):   Goals and Risk Factor Review - 12/29/19 0754      Core Components/Risk Factors/Patient Goals Review   Personal Goals Review Develop more efficient breathing techniques such as purse lipped breathing and diaphragmatic breathing and practicing self-pacing with activity.;Improve shortness of breath with ADL's    Review Shadrack uses oxygen as needed at home.  He can tell he has improved since he has been exercising.  He uses PLB.  He has arthritis and it takes him a little longer to get going in the morning    Expected Outcomes Short:  use PLB to help with ADLs Long:  manage health concerns           ITP Comments:  ITP Comments    Row Name 10/27/19 0912 10/28/19 1107 11/03/19 0828 11/10/19 0552 12/01/19 0845   ITP Comments Completed virtual orientation today.  EP evaluation is scheduled for Thursday 3/11 at 930am.  Documentation for diagnosis can be found in [facility] encounter 10/22/2019. Completed 6MWT and gym orientation.  Initial ITP created and sent for review to Dr. Emily Filbert, Medical Director. First full day of exercise!  Patient was oriented to gym and equipment including functions, settings, policies, and procedures.  Patient's individual exercise prescription and treatment plan were reviewed.  All starting workloads were established based on the results of the 6 minute walk test done at initial orientation visit.  The plan for exercise progression was also introduced and progression will be customized based on patient's performance and goals. 30 day chart review completed. ITP sent to Dr Zachery Dakins Medical Director, for review,changes as needed and signature. Continue with ITP if no changes requested Today while on the TM, sats dropped to 85%. Cued PLB and decreased Workload to get sat to 88%   Row Name 12/08/19 0530 12/08/19 0750 12/09/19 1321 12/16/19 0831 01/05/20 0534   ITP Comments 30 Day review completed. Medical Director review done,  changes made as directed,and approval shown by signature of Market researcher. O2 Sat  Dropping to 85% today   Oxygen increased to 4 L Completed Initial RD Eval Adjusting oyygen while on TM for sat lower than 88%. Change did bring sats up to 93% 30 Day review completed. ITP review done, changes made as directed,and approval shown by signature of  program Market researcher.   [place] Name 01/18/20 1528 01/24/20 1223 02/02/20 0518       ITP Comments Eston was out last week getting evaluated for lung transplant at [facility]. Called to check on pt. He was discharged yesterday from hospital.  Pt will ned to stay out until 6/21, should return on 6/23. 30 Day review completed. Medical Director ITP review done, changes made as directed,and signed approval by Medical Director.            Comments: 30 Day review completed. Medical Director ITP review done, changes made as directed,and signed approval by Medical Director.

## 2020-02-03 ENCOUNTER — Telehealth: Payer: Self-pay | Admitting: *Deleted

## 2020-02-03 DIAGNOSIS — J84112 Idiopathic pulmonary fibrosis: Secondary | ICD-10-CM | POA: Diagnosis not present

## 2020-02-03 DIAGNOSIS — J849 Interstitial pulmonary disease, unspecified: Secondary | ICD-10-CM | POA: Diagnosis not present

## 2020-02-03 DIAGNOSIS — Z01818 Encounter for other preprocedural examination: Secondary | ICD-10-CM | POA: Diagnosis not present

## 2020-02-03 DIAGNOSIS — Z87891 Personal history of nicotine dependence: Secondary | ICD-10-CM | POA: Diagnosis not present

## 2020-02-03 DIAGNOSIS — Z9981 Dependence on supplemental oxygen: Secondary | ICD-10-CM | POA: Diagnosis not present

## 2020-02-03 DIAGNOSIS — R262 Difficulty in walking, not elsewhere classified: Secondary | ICD-10-CM | POA: Diagnosis not present

## 2020-02-03 NOTE — Telephone Encounter (Signed)
Riley plans to return next week.

## 2020-02-07 ENCOUNTER — Telehealth: Payer: Self-pay | Admitting: Internal Medicine

## 2020-02-07 ENCOUNTER — Emergency Department (HOSPITAL_COMMUNITY)
Admission: EM | Admit: 2020-02-07 | Discharge: 2020-02-08 | Disposition: A | Discharge status: Home / Self Care | Payer: PPO | Attending: Emergency Medicine | Admitting: Emergency Medicine

## 2020-02-07 ENCOUNTER — Encounter (HOSPITAL_COMMUNITY): Payer: Self-pay | Admitting: Emergency Medicine

## 2020-02-07 DIAGNOSIS — R519 Headache, unspecified: Secondary | ICD-10-CM | POA: Diagnosis not present

## 2020-02-07 DIAGNOSIS — Z5321 Procedure and treatment not carried out due to patient leaving prior to being seen by health care provider: Secondary | ICD-10-CM | POA: Insufficient documentation

## 2020-02-07 NOTE — Telephone Encounter (Signed)
He was previously only on 4L oxygen; 15L is a big change and he likely needs to be evaluated in ED.   Cc: Dr. Chase Caller

## 2020-02-07 NOTE — Telephone Encounter (Signed)
Spoke with pt's wife, Lawrence Wells. States that pt is needing more oxygen than what he's being doing. While at pulmonary rehab on Friday, he needed 15L with exertion. Jody would like [place] to send in an order to have this increased due to his concentrator only going up to 5L. We have the documentation in Epic to support this order. While speaking to [place], she states that the pt has been having a severe headache over the weekend that extends into his neck. Pt was recently started on Eliquis and a prednisone taper, Jody wonders if either medication could be causing this headache. He does not have a history of high blood pressure but she is going to check it to be sure.  Beth - please advise. Thanks.

## 2020-02-07 NOTE — ED Notes (Signed)
Pt called x 3 with no answer

## 2020-02-07 NOTE — Telephone Encounter (Signed)
Spoke with pt's wife, [name]. Pt has been scheduled to see Beth tomorrow at 1530. Nothing further was needed.

## 2020-02-07 NOTE — ED Triage Notes (Signed)
Pt. Stated, Lawrence Wells had a bad headache since Friday. I went to go to work and could not make it.

## 2020-02-07 NOTE — Telephone Encounter (Signed)
Spoke with pt and his wife. They are aware of this information. Nothing further was needed.

## 2020-02-08 ENCOUNTER — Other Ambulatory Visit: Payer: Self-pay

## 2020-02-08 ENCOUNTER — Ambulatory Visit (INDEPENDENT_AMBULATORY_CARE_PROVIDER_SITE_OTHER): Payer: PPO | Admitting: Primary Care

## 2020-02-08 ENCOUNTER — Encounter: Payer: Self-pay | Admitting: Primary Care

## 2020-02-08 VITALS — BP 122/72 | HR 72 | Temp 97.2°F | Ht 71.0 in | Wt 158.2 lb

## 2020-02-08 DIAGNOSIS — G8929 Other chronic pain: Secondary | ICD-10-CM | POA: Diagnosis not present

## 2020-02-08 DIAGNOSIS — R519 Headache, unspecified: Secondary | ICD-10-CM | POA: Diagnosis not present

## 2020-02-08 DIAGNOSIS — M21372 Foot drop, left foot: Secondary | ICD-10-CM

## 2020-02-08 DIAGNOSIS — R0602 Shortness of breath: Secondary | ICD-10-CM | POA: Diagnosis not present

## 2020-02-08 DIAGNOSIS — J849 Interstitial pulmonary disease, unspecified: Secondary | ICD-10-CM | POA: Diagnosis not present

## 2020-02-08 NOTE — Progress Notes (Signed)
@Patient  ID: Lawrence Wells, male    DOB: Jun 14, 1956, 64 y.o.   MRN: [id number]  Chief Complaint  Patient presents with  . Follow-up    using O2 at night and more with activites, 5l with pulse O2, more headaches    Referring provider: Nobie Putnam *  HPI: 64 year old male, former smoker quit in 2007 (45-pack-year history).  Past medical history significant for interstitial lung disease.  Patient of Dr. Chase Caller, last seen 01/06/2020. Works in Emergency planning/management officer for 30 years. 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 Started on Ofev 10/12/19. Rheumatology visit confirming diagnosis of rheumatoid arthritis on 10/21/2019.  He is on Imuran for RA. Undergoing pulmonary rehab and is using 3-4L of oxygen with exertion.   Previous [facility] pulmonary encounters: 01/19/2020 Patient contacted today for televisit. Wife accompanied on phone call. During last visit patient weighted 159, reports currently weight 156.8. He stopped OFEV 2 weeks ago d/t diarrhea and weight loss. He will be seeing rheumatology this Friday and will ask about starting plaquenil for pain.   Tested positive for COVID on May 24th transplant clinic [facility]. He had a second positive test at a drive through clinic in [place] on Saturday 01/15/20. He was not previously experiencing symptoms except for increased oxygen demand and wife reports that he has been more lethargic and sleepy. Over the last two weeks he has been wearing oxygen at night and needing to use it pretty consistently during the day. He reports having some chest tightness when off oxygen in the morning. He has a chronic cough had is somewhat productive. He reports increased dyspnea on exertion. Received second April 28th covid vaccine.   02/08/2020- Interim hx Patient presents today for follow-up. Accompanied by his wife. He had Pulmonary testing/rehab with [facility]. Reports that during 6 min walk he needed 15L. He normally uses 5L oxygen at home. States that he  needs larger O2 tank to accomodate this. 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 He is interested in back pack for oxygen tank. He had been in touch with [employer] but he didn't not want to use them. He can rent this from that company.   He had previously stopped OFEV end of May. He tested positive for covid. He restarted OFEV and is currently he is on 150mg  twice a day. Weight is not back to his baseline but he is not losing. Appetite returned to normal. He has had no diarrhea. He continues prednisone taper, he is on 10mg  currently daily. Rheumatology put patient on prednisone 7.5mg  daily for 30 days. He is on sprivia respimat. He has not needed rescue inhaler. Uses 5L oxygen at night. He is participating in physical therapy in [place]. Ambulatory walk today showed that he maintained oxygen saturation > 90% on 3L.  He has been having headaches since last week. He feels a little wobbly. Reports left foot "Flopping" since he was hospitalized in June. Denies vision changes, confusion, mental status change, change in speech.    Testing: 01/20/20 HRCT- The appearance of the lungs is very similar to the prior study with a spectrum of findings considered diagnostic of usual interstitial pneumonia (UIP) per current ATS guidelines.  01/20/20 Echocardiogram- EF 60-65%, grade 2 DD  01/21/20 - BNP 271  Allergies  Allergen Reactions  . Atorvastatin Other (See Comments)    Myalgias    Immunization History  Administered Date(s) Administered  . PFIZER SARS-COV-2 Vaccination 11/22/2019, 12/15/2019  . Pneumococcal Conjugate-13 09/08/2019  . Tdap 04/19/2015, 05/07/2017  .  Zoster Recombinat (Shingrix) 09/29/2019, 11/27/2019    Past Medical History:  Diagnosis Date  . Arthritis   . COPD (chronic obstructive pulmonary disease) ([place])   . Coronary artery disease   . Dyspnea   . Emphysema lung ([place])   . Hyperlipidemia   . Pulmonary filariasis     Tobacco History: Social History   Tobacco Use    Smoking Status Former Smoker  . Packs/day: 1.50  . Years: 30.00  . Pack years: 45.00  . Types: Cigarettes  . Quit date: 2007  . Years since quitting: 14.5  Smokeless Tobacco Former Systems developer  . Types: Chew  . Quit date: 2007  Tobacco Comment   Dip smokeless tobacco >20-30 years   Counseling given: Not Answered Comment: Dip smokeless tobacco >20-30 years   Outpatient Medications Prior to Visit  Medication Sig Dispense Refill  . albuterol (PROAIR HFA) 108 (90 Base) MCG/ACT inhaler INHALE 2 PUFFS INTO THE LUNGS EVERY 4 HOURS AS NEEDED FOR WHEEZING OR SHORTNESS OF BREATH OR COUGH    . apixaban (ELIQUIS) 5 MG TABS tablet Take 1 tablet (5 mg total) by mouth 2 (two) times daily. 60 tablet 0  . azaTHIOprine (IMURAN) 50 MG tablet Take 2 tablets (100 mg total) by mouth daily. 180 tablet 0  . diclofenac Sodium (VOLTAREN) 1 % GEL Apply 2 g topically 3 (three) times daily as needed. (Patient taking differently: Apply 2 g topically 3 (three) times daily as needed (Pain (Arthritis)). ) 100 g 2  . Multiple Vitamin (MULTIVITAMIN) tablet Take 1 tablet by mouth daily.    . Nintedanib (OFEV) 150 MG CAPS Please take 1 tablet oral daily for 1 week, then resume back to 1 tablet twice daily after  that 60 capsule 11  . OXYGEN Inhale into the lungs as needed.    . pantoprazole (PROTONIX) 40 MG tablet Take 1 tablet (40 mg total) by mouth daily. 30 tablet 1  . predniSONE (DELTASONE) 10 MG tablet Take 60 mg daily for 5 days, then 40 mg oral daily for 5 days, then 20 mg oral daily for 5 days, then 10 mg oral daily for 5 days. 65 tablet 0  . Tiotropium Bromide Monohydrate (SPIRIVA RESPIMAT) 2.5 MCG/ACT AERS Inhale 2 puffs into the lungs daily. 1 g 5  . ezetimibe (ZETIA) 10 MG tablet Take 1 tablet (10 mg total) by mouth daily. 90 tablet 3  . gabapentin (NEURONTIN) 100 MG capsule Start 1 capsule daily, increase by 1 cap every 2-3 days as tolerated up to 3 times a day, or may take 3 at once in evening. (Patient taking  differently: Take 200 mg by mouth 2 (two) times daily. ) 90 capsule 3   No facility-administered medications prior to visit.      Review of Systems  Review of Systems  Constitutional: Negative for unexpected weight change.  Respiratory: Positive for shortness of breath.   Cardiovascular: Negative.   Gastrointestinal: Negative for diarrhea.  Neurological: Positive for headaches.   Physical Exam  BP 122/72 (BP Location: Left Arm, Cuff Size: Normal)   Pulse 72   Temp (!) 97.2 F (36.2 C) (Oral)   Ht 5\' 11"  (1.803 m)   Wt 158 lb 3.2 oz (71.8 kg)   SpO2 91%   BMI 22.06 kg/m  Physical Exam Constitutional:      General: He is not in acute distress.    Appearance: Normal appearance. He is not ill-appearing.  HENT:     Head: Normocephalic and atraumatic.  Cardiovascular:  Rate and Rhythm: Normal rate and regular rhythm.  Pulmonary:     Breath sounds: Rales present.  Musculoskeletal:        General: Normal range of motion.  Neurological:     General: No focal deficit present.     Mental Status: He is alert and oriented to person, place, and time. Mental status is at baseline.  Psychiatric:        Mood and Affect: Mood normal.        Behavior: Behavior normal.        Thought Content: Thought content normal.      Lab Results:  CBC    Component Value Date/Time   WBC 19.6 (H) 01/23/2020 0202   RBC 4.11 (L) 01/23/2020 0202   HGB 13.1 01/23/2020 0202   HCT 39.5 01/23/2020 0202   PLT 472 (H) 01/23/2020 0202   MCV 96.1 01/23/2020 0202   MCH 31.9 01/23/2020 0202   MCHC 33.2 01/23/2020 0202   RDW 12.7 01/23/2020 0202   LYMPHSABS 2.4 01/22/2020 0454   MONOABS 0.7 01/22/2020 0454   EOSABS 0.0 01/22/2020 0454   BASOSABS 0.0 01/22/2020 0454    BMET    Component Value Date/Time   NA 135 02/08/2020 1645   K 4.8 02/08/2020 1645   CL 100 02/08/2020 1645   CO2 29 02/08/2020 1645   GLUCOSE 105 (H) 02/08/2020 1645   BUN 32 (H) 02/08/2020 1645   CREATININE 0.76  02/08/2020 1645   CREATININE 0.84 11/18/2019 1030   CALCIUM 9.5 02/08/2020 1645   GFRNONAA >60 01/23/2020 0202   GFRNONAA 92 11/18/2019 1030   GFRAA >60 01/23/2020 0202   GFRAA 106 11/18/2019 1030    BNP    Component Value Date/Time   BNP 271.4 (H) 01/21/2020 0818    ProBNP    Component Value Date/Time   PROBNP 122.0 (H) 02/08/2020 1645    Imaging: DG Chest 2 View  Result Date: 02/10/2020 CLINICAL DATA:  Chronic interstitial lung disease. EXAM: CHEST - 2 VIEW COMPARISON:  January 19, 2020. FINDINGS: The heart size and mediastinal contours are within normal limits. No pneumothorax or pleural effusion is noted. Stable interstitial reticular densities are noted throughout both lungs most consistent with chronic interstitial lung disease or fibrosis. No definite acute abnormality is noted. The visualized skeletal structures are unremarkable. IMPRESSION: Stable chronic interstitial lung disease or fibrosis. No definite acute abnormality is noted. Electronically Signed   By: Marijo Conception M.D.   On: 02/10/2020 16:10   CT ANGIO CHEST PE W OR WO CONTRAST  Result Date: 01/20/2020 CLINICAL DATA:  64 year old male with hypoxia and COVID-19. Rule out pulmonary embolism. EXAM: CT ANGIOGRAPHY CHEST WITH CONTRAST TECHNIQUE: Multidetector CT imaging of the chest was performed using the standard protocol during bolus administration of intravenous contrast. Multiplanar CT image reconstructions and MIPs were obtained to evaluate the vascular anatomy. CONTRAST:  31mL OMNIPAQUE IOHEXOL 350 MG/ML SOLN COMPARISON:  Chest radiograph dated 01/19/2020 and CT dated 09/14/2019. FINDINGS: Cardiovascular: There is mild cardiomegaly. No pericardial effusion. Coronary vascular calcifications primarily involving the LAD and RCA. The thoracic aorta is unremarkable. Mild dilatation of the main pulmonary trunk suggestive of a degree of pulmonary hypertension. Evaluation of the pulmonary arteries is somewhat limited due to  respiratory motion artifact. No pulmonary artery embolus identified. Mediastinum/Nodes: Several top-normal bilateral hilar and mediastinal lymph nodes. These measure up to approximately 11 mm in short axis in the right hilum 13 mm in the subcarinal region and 10 mm in the  aortopulmonic window relatively similar to prior CT. The esophagus is grossly unremarkable. No mediastinal fluid collection. Lungs/Pleura: There is background of emphysema and interstitial lung disease. There is subpleural reticulation and honeycombing most severely involving the left lung base. There is traction bronchiectasis. Diffuse ground-glass density throughout the lungs with confluent densities involving the lower lung field may represent superimposed pneumonia. Right apical subpleural scarring. No lobar consolidation, pleural effusion, or pneumothorax. The central airways are patent. Upper Abdomen: No acute abnormality. Musculoskeletal: No chest wall abnormality. No acute or significant osseous findings. Review of the MIP images confirms the above findings. IMPRESSION: 1. No CT evidence of pulmonary artery embolus. 2. Emphysema and interstitial lung disease. 3. Diffuse airspace ground-glass opacity and confluent areas of densities primarily involving the lower lung field concerning for superimposed pneumonia. Clinical correlation and follow-up recommended. Please refer to the accompanying high-resolution CT report for detail pleuroparenchymal findings. 4. Aortic Atherosclerosis (ICD10-I70.0) and Emphysema (ICD10-J43.9). Electronically Signed   By: Anner Crete M.D.   On: 01/20/2020 23:42   CT Chest High Resolution  Result Date: 01/21/2020 CLINICAL DATA:  64 year old male with history of hypoxia and COVID-19. EXAM: CT CHEST WITHOUT CONTRAST TECHNIQUE: Multidetector CT imaging of the chest was performed following the standard protocol without intravenous contrast. High resolution imaging of the lungs, as well as inspiratory and  expiratory imaging, was performed. COMPARISON:  Chest CT 09/14/2019. FINDINGS: Comment: Study is limited by non standard protocol. Cardiovascular: Heart size is mildly enlarged with dilatation of the right atrium and right ventricle. There is no significant pericardial fluid, thickening or pericardial calcification. There is aortic atherosclerosis, as well as atherosclerosis of the great vessels of the mediastinum and the coronary arteries, including calcified atherosclerotic plaque in the left main, left anterior descending and right coronary arteries. Dilatation of the pulmonic trunk (4.0 cm in diameter). Mediastinum/Nodes: Multiple prominent but nonenlarged mediastinal and bilateral hilar lymph nodes, similar to the prior study, presumably chronic and reactive in the setting of interstitial lung disease. Esophagus is unremarkable in appearance. No axillary lymphadenopathy. Lungs/Pleura: Standard high-resolution protocol was not performed. With this limitation in mind, the limited high-resolution images clearly demonstrate widespread areas of septal thickening, subpleural reticulation, parenchymal banding, traction bronchiectasis and honeycombing in the lungs bilaterally (left greater than right). Findings again have a craniocaudal gradient and do not appear progressive compared to the prior study from September 14, 2019. Inspiratory and expiratory imaging was performed and is unremarkable. Diffuse bronchial wall thickening with moderate centrilobular and paraseptal emphysema. No acute consolidative airspace disease. No pleural effusions. No definite suspicious appearing pulmonary nodules or masses are noted. Chronic partially calcified pleuroparenchymal thickening and architectural distortion in the lung apices bilaterally (right greater than left), similar to the prior study, most compatible with chronic post infectious or inflammatory scarring. Upper Abdomen: Aortic atherosclerosis. Musculoskeletal: There are no  aggressive appearing lytic or blastic lesions noted in the visualized portions of the skeleton. IMPRESSION: 1. The appearance of the lungs is very similar to the prior study with a spectrum of findings considered diagnostic of usual interstitial pneumonia (UIP) per current ATS guidelines. 2. Diffuse bronchial wall thickening with moderate centrilobular and paraseptal emphysema; imaging findings suggestive of underlying COPD. 3. Cardiomegaly with dilatation of the right atrium and right ventricle. There is also dilatation of the pulmonic trunk (4.0 cm in diameter), concerning for pulmonary arterial hypertension. 4. Aortic atherosclerosis, in addition to left main and 2 vessel coronary artery disease. Please note that although the presence of coronary artery calcium documents  the presence of coronary artery disease, the severity of this disease and any potential stenosis cannot be assessed on this non-gated CT examination. Assessment for potential risk factor modification, dietary therapy or pharmacologic therapy may be warranted, if clinically indicated. Aortic Atherosclerosis (ICD10-I70.0) and Emphysema (ICD10-J43.9). Electronically Signed   By: Vinnie Langton M.D.   On: 01/21/2020 08:16   DG Chest Port 1 View  Result Date: 01/19/2020 CLINICAL DATA:  Shortness of breath, COVID-19 positive, pulmonary fibrosis EXAM: PORTABLE CHEST 1 VIEW COMPARISON:  09/08/2019 FINDINGS: Stable cardiomediastinal contours. Diffuse interstitial fibrotic lung changes with a basilar predominance no definite superimposed airspace opacity. Subtle area of nodularity in the left lung apex appears less prominent compared to the prior study. No pleural effusion or pneumothorax. IMPRESSION: Diffuse interstitial fibrotic lung changes without definite superimposed airspace opacity. Electronically Signed   By: Davina Poke D.O.   On: 01/19/2020 16:14   ECHOCARDIOGRAM COMPLETE  Result Date: 01/20/2020    ECHOCARDIOGRAM REPORT   Patient  Name:   Lawrence Wells Date of Exam: 01/20/2020 Medical Rec #:  [id number]      Height:       71.0 in Accession #:    [id number]     Weight:       157.8 lb Date of Birth:  06-Feb-1956      BSA:          1.907 m Patient Age:    30 years       BP:           114/77 mmHg Patient Gender: M              HR:           83 bpm. Exam Location:  Inpatient Procedure: 2D Echo Indications:    dyspnea  History:        Patient has no prior history of Echocardiogram examinations.                 CAD, COPD; Risk Factors:Dyslipidemia and Former Smoker.  Sonographer:    Jannett Celestine RDCS (AE) Referring Phys: [id number] IMPRESSIONS  1. Left ventricular ejection fraction, by estimation, is 60 to 65%. The left ventricle has normal function. The left ventricle has no regional wall motion abnormalities. Left ventricular diastolic parameters are consistent with Grade II diastolic dysfunction (pseudonormalization). There is the interventricular septum is flattened in diastole ('D' shaped left ventricle), consistent with right ventricular volume overload.  2. Right ventricular systolic function is moderately reduced. The right ventricular size is normal.  3. The mitral valve is normal in structure. No evidence of mitral valve regurgitation. No evidence of mitral stenosis.  4. The aortic valve is normal in structure. Aortic valve regurgitation is not visualized. No aortic stenosis is present. FINDINGS  Left Ventricle: Left ventricular ejection fraction, by estimation, is 60 to 65%. The left ventricle has normal function. The left ventricle has no regional wall motion abnormalities. The left ventricular internal cavity size was normal in size. There is  no left ventricular hypertrophy. The interventricular septum is flattened in diastole ('D' shaped left ventricle), consistent with right ventricular volume overload. Left ventricular diastolic parameters are consistent with Grade II diastolic dysfunction (pseudonormalization). Right  Ventricle: The right ventricular size is normal. No increase in right ventricular wall thickness. Right ventricular systolic function is moderately reduced. Left Atrium: Left atrial size was normal in size. Right Atrium: Right atrial size was normal in size. Pericardium: There is no evidence of pericardial  effusion. Mitral Valve: The mitral valve is normal in structure. No evidence of mitral valve regurgitation. No evidence of mitral valve stenosis. Tricuspid Valve: The tricuspid valve is normal in structure. Tricuspid valve regurgitation is trivial. No evidence of tricuspid stenosis. Aortic Valve: The aortic valve is normal in structure. Aortic valve regurgitation is not visualized. No aortic stenosis is present. Pulmonic Valve: The pulmonic valve was normal in structure. Pulmonic valve regurgitation is not visualized. No evidence of pulmonic stenosis. Aorta: The aortic root and ascending aorta are structurally normal, with no evidence of dilitation. IAS/Shunts: The atrial septum is grossly normal.  LEFT VENTRICLE PLAX 2D LVIDd:         4.15 cm  Diastology LVIDs:         2.80 cm  LV e' lateral:   11.25 cm/s LV PW:         1.25 cm  LV E/e' lateral: 4.9 LV IVS:        1.30 cm  LV e' medial:    5.33 cm/s LVOT diam:     2.30 cm  LV E/e' medial:  10.3 LV SV:         43 LV SV Index:   22 LVOT Area:     4.15 cm  LEFT ATRIUM           Index       RIGHT ATRIUM           Index LA diam:      4.30 cm 2.26 cm/m  RA Area:     27.40 cm LA Vol (A4C): 46.6 ml 24.44 ml/m RA Volume:   101.00 ml 52.97 ml/m  AORTIC VALVE LVOT Vmax:   67.20 cm/s LVOT Vmean:  54.800 cm/s LVOT VTI:    0.103 m  AORTA Ao Root diam: 3.50 cm MITRAL VALVE MV Area (PHT): 3.77 cm    SHUNTS MV Decel Time: 201 msec    Systemic VTI:  0.10 m MV E velocity: 54.80 cm/s  Systemic Diam: 2.30 cm MV A velocity: 45.80 cm/s MV E/A ratio:  1.20 Mertie Moores MD Electronically signed by Mertie Moores MD Signature Date/Time: 01/20/2020/3:16:40 PM    Final       Assessment & Plan:   ILD (interstitial lung disease) ([place]) - Stable; Patient tested positive for COVID in May 2021. He has resummed antifibrotic medication and is tolerating. Reports increased oxygen demand during 6MWT with [facility]. Today his O2 saturation stayed above 88-90% on 3L.  -  01/20/20 HRCT- appearance of the lungs is very similar to the prior study with a spectrum of findings considered diagnostic of usual interstitial pneumonia (UIP)  - CMET within normal limits today  - Continue OFEV 150mg  twice daily - Ambulatory walk today showed that he maintained oxygen saturation 90% on 3L after 2 laps - Will send in DME order for larger tanks and patient to contact [employer] for continuous portable concentrator  - 6 weeks with Dr. Chase Caller 30-minute slot ILD clinic  Foot drop - Refer to Neurology for left foot drop and headaches  Martyn Ehrich, NP 02/15/2020

## 2020-02-08 NOTE — Patient Instructions (Addendum)
Orders: Needs labs drawn today (BNP and CMET) Needs O2 tanks and concentrator at home to accommodate up to 15L   Chronic respiratory failure: You need to use ___0-2___ L at rest; and ___3____ L on exertion Recommend that you contact [employer] for continuous portable oxygen concentrator  Recommend that you wear oxygen when doing household tasks, exerting yourself and when leaving your house  RA- ILD: Continue Ofev 150 mg twice daily Continue pulmonary rehab and further work-up with [facility]  Continue prednisone taper until completed; then start 7.5 mg daily per rheumatology x1 month  Headache: Continue taking Tylenol 650 mg every 4-6 hours as needed Make sure to stay well-hydrated Ensure compliance with oxygen  Referral: Neurology for left foot drop and headaches  Follow-up: 6 weeks with Dr. Chase Caller 30-minute slot ILD clinic

## 2020-02-09 ENCOUNTER — Encounter: Payer: Self-pay | Admitting: Neurology

## 2020-02-09 ENCOUNTER — Encounter: Payer: Self-pay | Admitting: Family Medicine

## 2020-02-09 ENCOUNTER — Encounter: Payer: PPO | Admitting: *Deleted

## 2020-02-09 ENCOUNTER — Ambulatory Visit (INDEPENDENT_AMBULATORY_CARE_PROVIDER_SITE_OTHER): Payer: PPO | Admitting: Family Medicine

## 2020-02-09 ENCOUNTER — Ambulatory Visit
Admission: RE | Admit: 2020-02-09 | Discharge: 2020-02-09 | Disposition: A | Discharge status: Home / Self Care | Payer: PPO | Source: Ambulatory Visit | Attending: Family Medicine | Admitting: Family Medicine

## 2020-02-09 VITALS — BP 97/59 | HR 73 | Temp 97.8°F | Resp 16 | Ht 71.0 in | Wt 158.0 lb

## 2020-02-09 DIAGNOSIS — J849 Interstitial pulmonary disease, unspecified: Secondary | ICD-10-CM

## 2020-02-09 DIAGNOSIS — J432 Centrilobular emphysema: Secondary | ICD-10-CM | POA: Insufficient documentation

## 2020-02-09 DIAGNOSIS — Z789 Other specified health status: Secondary | ICD-10-CM | POA: Diagnosis not present

## 2020-02-09 DIAGNOSIS — A6002 Herpesviral infection of other male genital organs: Secondary | ICD-10-CM | POA: Diagnosis not present

## 2020-02-09 DIAGNOSIS — M0579 Rheumatoid arthritis with rheumatoid factor of multiple sites without organ or systems involvement: Secondary | ICD-10-CM | POA: Diagnosis not present

## 2020-02-09 DIAGNOSIS — J8410 Pulmonary fibrosis, unspecified: Secondary | ICD-10-CM | POA: Diagnosis not present

## 2020-02-09 DIAGNOSIS — I700 Atherosclerosis of aorta: Secondary | ICD-10-CM | POA: Diagnosis not present

## 2020-02-09 DIAGNOSIS — J90 Pleural effusion, not elsewhere classified: Secondary | ICD-10-CM | POA: Diagnosis not present

## 2020-02-09 LAB — COMPREHENSIVE METABOLIC PANEL
ALT: 17 U/L (ref 0–53)
AST: 21 U/L (ref 0–37)
Albumin: 3.9 g/dL (ref 3.5–5.2)
Alkaline Phosphatase: 83 U/L (ref 39–117)
BUN: 32 mg/dL — ABNORMAL HIGH (ref 6–23)
CO2: 29 mEq/L (ref 19–32)
Calcium: 9.5 mg/dL (ref 8.4–10.5)
Chloride: 100 mEq/L (ref 96–112)
Creatinine, Ser: 0.76 mg/dL (ref 0.40–1.50)
GFR: 102.61 mL/min (ref >60.00)
Glucose, Bld: 105 mg/dL — ABNORMAL HIGH (ref 70–99)
Potassium: 4.8 mEq/L (ref 3.5–5.1)
Sodium: 135 mEq/L (ref 135–145)
Total Bilirubin: 0.3 mg/dL (ref 0.2–1.2)
Total Protein: 6.9 g/dL (ref 6.0–8.3)

## 2020-02-09 LAB — BRAIN NATRIURETIC PEPTIDE: Pro B Natriuretic peptide (BNP): 122 pg/mL — ABNORMAL HIGH (ref 0.0–100.0)

## 2020-02-09 MED ORDER — VALACYCLOVIR HCL 500 MG PO TABS: 500.0000 mg | ORAL_TABLET | Freq: Two times a day (BID) | ORAL | 1 refills | Status: DC | PRN

## 2020-02-09 NOTE — Progress Notes (Signed)
Daily Session Note  Patient Details  Name: Lawrence Wells MRN: [id number] Date of Birth: December 13, 1955 Referring Provider:     Pulmonary Rehab from 10/28/2019 in [facility]  Referring Provider Brand Males MD      Encounter Date: 02/09/2020  Check In:  Session Check In - 02/09/20 0854      Check-In   Supervising physician immediately available to respond to emergencies See telemetry face sheet for immediately available ER MD    Location [facility]-Cardiac & Pulmonary Rehab    Staff Present Heath Lark, RN, BSN, CCRP;Jessica Esperanza, MA, RCEP, CCRP, [place], [place], ACSM CEP, Exercise Physiologist    Virtual Visit No    Medication changes reported     No    Fall or balance concerns reported    No    Warm-up and Cool-down Performed on first and last piece of equipment    Resistance Training Performed Yes    VAD Patient? No    PAD/SET Patient? No      Pain Assessment   Currently in Pain? No/denies              Social History   Tobacco Use  Smoking Status Former Smoker  . Packs/day: 1.50  . Years: 30.00  . Pack years: 45.00  . Types: Cigarettes  . Quit date: 2007  . Years since quitting: 14.4  Smokeless Tobacco Former Systems developer  . Types: Chew  . Quit date: 2007  Tobacco Comment   Dip smokeless tobacco >20-30 years    Goals Met:  Independence with exercise equipment Exercise tolerated well No report of cardiac concerns or symptoms  Goals Unmet:  O2 Sat  Comments: Pt able to follow exercise prescription today without complaint.  Will continue to monitor for progression.  Judea required O2 increase with exercise. Up to 4L from 2 L today O2 sats were as low as 76%   Dr. Emily Filbert is Medical Director for [facility].

## 2020-02-09 NOTE — Patient Instructions (Addendum)
Thank you for coming to the office today.  Ask your Lung Transplant specialist about a list of vaccines you will need updated  We will check blood today for MMR (measles mump rubella immunity) and also Heatitis B immunity.  X-ray today, lung doctors can review it.  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 [place], [place] [address] Main: [phone]   Please schedule a Follow-up Appointment to: Return if symptoms worsen or fail to improve.  If you have any other questions or concerns, please feel free to call the office or send a message through [place]. You may also schedule an earlier appointment if necessary.  Additionally, you may be receiving a survey about your experience at our office within a few days to 1 week by e-mail or mail. We value your feedback.  Nobie Putnam, DO [place]

## 2020-02-09 NOTE — Progress Notes (Signed)
Subjective:    Patient ID: Lawrence Wells, male    DOB: Sep 27, 1955, 64 y.o.   MRN: [id number]  Lawrence Wells is a 64 y.o. male presenting on 02/09/2020 for Hospitalization Follow-up (x-ray recommondation from ED) and Chronic Interstitial Lung Disease, Rheumatoid Arthritis   HPI    Genital Herpes, acute outbreak flare Reports new onset acute outbreak recent onset due to stressors. Had previously been more infrequent Request rx valtrex  Chronic ILD, in setting of RA Followed by [facility] He went to ED on 02/07/20, and instead he was able to schedule to see Pulm in office yesterday 02/08/20 They requested CXR perform today and to review results. He has had prior CT recently He is on Spiriva. Not using rescue inhaler Oxygen per Pulmonology 5L overnight and has used O2 more often during day with activities. 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 Has done Physical therapy (pulm rehab)  Aortic Atherosclerosis Identified on CT Chest / CTA on 01/20/20. See results below. He is currently on Zetia.   Depression screen [facility] 2/9 12/08/2019 12/01/2019 10/28/2019  Decreased Interest 0 0 1  Down, Depressed, Hopeless 1 0 0  PHQ - 2 Score 1 0 1  Altered sleeping '1 1 2  ' Tired, decreased energy '1 1 1  ' Change in appetite 0 0 0  Feeling bad or failure about yourself  0 1 1  Trouble concentrating 0 0 0  Moving slowly or fidgety/restless '1 1 1  ' Suicidal thoughts 0 0 2  PHQ-9 Score '4 4 8  ' Difficult doing work/chores Not difficult at all - Not difficult at all    Social History   Tobacco Use  . Smoking status: Former Smoker    Packs/day: 1.50    Years: 30.00    Pack years: 45.00    Types: Cigarettes    Quit date: 2007    Years since quitting: 14.4  . Smokeless tobacco: Former Systems developer    Types: Chew    Quit date: 2007  . Tobacco comment: Dip smokeless tobacco  >20-30 years  Vaping Use  . Vaping Use: Never used  Substance Use Topics  . Alcohol use: Yes    Comment: occ  . Drug use: No    Review of Systems Per HPI unless specifically indicated above     Objective:    BP (!) 97/59   Pulse 73   Temp 97.8 F (36.6 C) (Temporal)   Resp 16   Ht '5\' 11"'  (1.803 m)   Wt 158 lb (71.7 kg)   SpO2 94%   BMI 22.04 kg/m   Wt Readings from Last 3 Encounters:  02/09/20 158 lb (71.7 kg)  02/08/20 158 lb 3.2 oz (71.8 kg)  02/07/20 161 lb (73 kg)    Physical Exam Vitals and nursing note reviewed.  Constitutional:      General: He is not in acute distress.    Appearance: He is well-developed. He is not diaphoretic.     Comments: Thin appearing 64 yr male, comfortable, cooperative  HENT:     Head: Normocephalic and atraumatic.  Eyes:     General:        Right eye: No discharge.        Left eye: No discharge.     Conjunctiva/sclera: Conjunctivae normal.  Neck:     Thyroid: No thyromegaly.  Cardiovascular:  Rate and Rhythm: Normal rate and regular rhythm.     Heart sounds: Normal heart sounds. No murmur heard.   Pulmonary:     Effort: Pulmonary effort is normal. No respiratory distress.     Breath sounds: Normal breath sounds. No wheezing or rales.     Comments: Mild reduced air movement, some slight coarse sounds, no significant wheezing Musculoskeletal:        General: Normal range of motion.     Cervical back: Normal range of motion and neck supple.  Lymphadenopathy:     Cervical: No cervical adenopathy.  Skin:    General: Skin is warm and dry.     Findings: No erythema or rash.  Neurological:     Mental Status: He is alert and oriented to person, place, and time.  Psychiatric:        Behavior: Behavior normal.     Comments: Well groomed, good eye contact, normal speech and thoughts      Echo- EF 60-65%, grade 2 DD BNP was 271   I have personally reviewed the radiology report from 01/20/20 on CT Chest.  CLINICAL DATA:   64 year old male with history of hypoxia and COVID-19.  EXAM: CT CHEST WITHOUT CONTRAST  TECHNIQUE: Multidetector CT imaging of the chest was performed following the standard protocol without intravenous contrast. High resolution imaging of the lungs, as well as inspiratory and expiratory imaging, was performed.  COMPARISON:  Chest CT 09/14/2019.  FINDINGS: Comment: Study is limited by non standard protocol.  Cardiovascular: Heart size is mildly enlarged with dilatation of the right atrium and right ventricle. There is no significant pericardial fluid, thickening or pericardial calcification. There is aortic atherosclerosis, as well as atherosclerosis of the great vessels of the mediastinum and the coronary arteries, including calcified atherosclerotic plaque in the left main, left anterior descending and right coronary arteries. Dilatation of the pulmonic trunk (4.0 cm in diameter).  Mediastinum/Nodes: Multiple prominent but nonenlarged mediastinal and bilateral hilar lymph nodes, similar to the prior study, presumably chronic and reactive in the setting of interstitial lung disease. Esophagus is unremarkable in appearance. No axillary lymphadenopathy.  Lungs/Pleura: Standard high-resolution protocol was not performed. With this limitation in mind, the limited high-resolution images clearly demonstrate widespread areas of septal thickening, subpleural reticulation, parenchymal banding, traction bronchiectasis and honeycombing in the lungs bilaterally (left greater than right). Findings again have a craniocaudal gradient and do not appear progressive compared to the prior study from September 14, 2019. Inspiratory and expiratory imaging was performed and is unremarkable. Diffuse bronchial wall thickening with moderate centrilobular and paraseptal emphysema. No acute consolidative airspace disease. No pleural effusions. No definite suspicious appearing pulmonary nodules  or masses are noted. Chronic partially calcified pleuroparenchymal thickening and architectural distortion in the lung apices bilaterally (right greater than left), similar to the prior study, most compatible with chronic post infectious or inflammatory scarring.  Upper Abdomen: Aortic atherosclerosis.  Musculoskeletal: There are no aggressive appearing lytic or blastic lesions noted in the visualized portions of the skeleton.  IMPRESSION: 1. The appearance of the lungs is very similar to the prior study with a spectrum of findings considered diagnostic of usual interstitial pneumonia (UIP) per current ATS guidelines. 2. Diffuse bronchial wall thickening with moderate centrilobular and paraseptal emphysema; imaging findings suggestive of underlying COPD. 3. Cardiomegaly with dilatation of the right atrium and right ventricle. There is also dilatation of the pulmonic trunk (4.0 cm in diameter), concerning for pulmonary arterial hypertension. 4. Aortic atherosclerosis, in addition to left  main and 2 vessel coronary artery disease. Please note that although the presence of coronary artery calcium documents the presence of coronary artery disease, the severity of this disease and any potential stenosis cannot be assessed on this non-gated CT examination. Assessment for potential risk factor modification, dietary therapy or pharmacologic therapy may be warranted, if clinically indicated.  Aortic Atherosclerosis (ICD10-I70.0) and Emphysema (ICD10-J43.9).   Electronically Signed   By: Vinnie Langton M.D.   On: 01/21/2020 08:16  Results for orders placed or performed in visit on 02/09/20  Hepatitis B surface antibody,qualitative  Result Value Ref Range   Hep B S Ab NON-REACTIVE NON-REACTI  Measles/Mumps/Rubella Immunity  Result Value Ref Range   [place]ola IgG 113.00 AU/mL   Mumps IgG 62.50 AU/mL   Rubella 6.59 Index   **Updated chart 02/10/20 with results  I have  personally reviewed the radiology report from 02/09/20 CXR.  CLINICAL DATA:  Chronic interstitial lung disease.  EXAM: CHEST - 2 VIEW  COMPARISON:  January 19, 2020.  FINDINGS: The heart size and mediastinal contours are within normal limits. No pneumothorax or pleural effusion is noted. Stable interstitial reticular densities are noted throughout both lungs most consistent with chronic interstitial lung disease or fibrosis. No definite acute abnormality is noted. The visualized skeletal structures are unremarkable.  IMPRESSION: Stable chronic interstitial lung disease or fibrosis. No definite acute abnormality is noted.   Electronically Signed   By: Marijo Conception M.D.   On: 02/10/2020 16:10  Lab Results  Component Value Date   HEPBSAB NON-REACTIVE 02/09/2020       Assessment & Plan:   Problem List Items Addressed This Visit    Rheumatoid arthritis involving multiple sites with positive rheumatoid factor ([place])    See A&P Followed by Rheumatology Complication with ILD On biologic agent On prednisone chronic      Pulmonary fibrosis ([place])   Relevant Orders   DG Chest 2 View (Completed)   ILD (interstitial lung disease) ([place]) - Primary    #Chronic ILD Followed by [facility] On biologic Ofev Continues Pulm rehab and followed by [facility] Transplant team. On prednisone taper as rx by Pulm, then instructions were for him to start 7.52m daily per Rheum x 1 month  Checked CXR today by request after patient not seen in ED. CXR was stable without acute finding, routed to Pulmonology      Relevant Orders   DG Chest 2 View (Completed)   Genital herpes   Relevant Medications   valACYclovir (VALTREX) 500 MG tablet   Centrilobular emphysema (H[facility])   Relevant Orders   DG Chest 2 View (Completed)   Aortic atherosclerosis ([place]    Identified on CT 68/1191No acute complications On Zetia       Other Visit Diagnoses    Hepatitis B vaccination status unknown        Relevant Orders   Hepatitis B surface antibody,qualitative (Completed)   History of measles, mumps, rubella (MMR) vaccination unknown       Relevant Orders   Measles/Mumps/Rubella Immunity (Completed)       #Genital herpes, outbreak HSV Consistent with early onset flare Rx Valtrex for PRN use  #Colon CA Screening - [place] Already [place]. Gave contact info.  #Immunizations / Future Lung Transplant - Check immunity today on lab titers Hep B s ab and MMR UPTDATED RESULTS  Not immune to Hep B. Needs vaccine, note in to staff to schedule Heplisav B vaccine  x 2 doses, 4 weeks apart. - He is immune to MMR - Other vaccines UTD - May follow up with transplant clinic and if any other vaccines recommended may contact us   Meds ordered this encounter  Medications  . valACYclovir (VALTREX) 500 MG tablet    Sig: Take 1 tablet (500 mg total) by mouth 2 (two) times daily as needed (herpes flare). For 3-7 days as needed for flare    Dispense:  30 tablet    Refill:  1     Follow up plan: Return if symptoms worsen or fail to improve.   Nobie Putnam, [facility] 02/09/2020, 11:44 AM

## 2020-02-10 ENCOUNTER — Encounter: Payer: PPO | Admitting: *Deleted

## 2020-02-10 ENCOUNTER — Other Ambulatory Visit: Payer: Self-pay

## 2020-02-10 DIAGNOSIS — J849 Interstitial pulmonary disease, unspecified: Secondary | ICD-10-CM | POA: Diagnosis not present

## 2020-02-10 DIAGNOSIS — I700 Atherosclerosis of aorta: Secondary | ICD-10-CM | POA: Insufficient documentation

## 2020-02-10 LAB — MEASLES/MUMPS/RUBELLA IMMUNITY
Mumps IgG: 62.5 AU/mL
Rubella: 6.59 Index
Rubeola IgG: 113 AU/mL

## 2020-02-10 LAB — HEPATITIS B SURFACE ANTIBODY,QUALITATIVE: Hep B S Ab: NONREACTIVE

## 2020-02-10 NOTE — Assessment & Plan Note (Signed)
See A&P Followed by Rheumatology Complication with ILD On biologic agent On prednisone chronic

## 2020-02-10 NOTE — Assessment & Plan Note (Signed)
#  Chronic ILD Followed by [facility] On biologic Ofev Continues Pulm rehab and followed by [facility] Transplant team. On prednisone taper as rx by Pulm, then instructions were for him to start 7.5mg  daily per Rheum x 1 month  Checked CXR today by request after patient not seen in ED. CXR was stable without acute finding, routed to Pulmonology

## 2020-02-10 NOTE — Progress Notes (Signed)
Daily Session Note  Patient Details  Name: Lawrence Wells MRN: [id number] Date of Birth: August 09, 1956 Referring Provider:     Pulmonary Rehab from 10/28/2019 in [facility]  Referring Provider Brand Males MD      Encounter Date: 02/10/2020  Check In:  Session Check In - 02/10/20 0834      Check-In   Supervising physician immediately available to respond to emergencies See telemetry face sheet for immediately available ER MD    Location [facility]-Cardiac & Pulmonary Rehab    Staff Present Heath Lark, RN, BSN, CCRP;Jessica [place], MA, RCEP, CCRP, [place], [place], ACSM CEP, Exercise Physiologist;Kara Eliezer Bottom, MS Exercise Physiologist    Virtual Visit No    Medication changes reported     No    Fall or balance concerns reported    No    Warm-up and Cool-down Performed on first and last piece of equipment    Resistance Training Performed Yes    VAD Patient? No    PAD/SET Patient? No      Pain Assessment   Currently in Pain? No/denies              Social History   Tobacco Use  Smoking Status Former Smoker  . Packs/day: 1.50  . Years: 30.00  . Pack years: 45.00  . Types: Cigarettes  . Quit date: 2007  . Years since quitting: 14.4  Smokeless Tobacco Former Systems developer  . Types: Chew  . Quit date: 2007  Tobacco Comment   Dip smokeless tobacco >20-30 years    Goals Met:  Proper associated with RPD/PD & O2 Sat Independence with exercise equipment Exercise tolerated well No report of cardiac concerns or symptoms  Goals Unmet:  Not Applicable  Comments: Pt able to follow exercise prescription today without complaint.  Will continue to monitor for progression.    Dr. Emily Filbert is Medical Director for [place] and [facility].

## 2020-02-10 NOTE — Assessment & Plan Note (Signed)
Identified on CT 11/5731 No acute complications On Zetia

## 2020-02-11 ENCOUNTER — Telehealth: Payer: Self-pay | Admitting: Gastroenterology

## 2020-02-11 ENCOUNTER — Encounter: Payer: Self-pay | Admitting: Family Medicine

## 2020-02-11 ENCOUNTER — Ambulatory Visit (INDEPENDENT_AMBULATORY_CARE_PROVIDER_SITE_OTHER): Payer: PPO | Admitting: Family Medicine

## 2020-02-11 DIAGNOSIS — J0110 Acute frontal sinusitis, unspecified: Secondary | ICD-10-CM

## 2020-02-11 MED ORDER — AMOXICILLIN-POT CLAVULANATE 875-125 MG PO TABS: 1.0000 | ORAL_TABLET | Freq: Two times a day (BID) | ORAL | 0 refills | Status: DC

## 2020-02-11 NOTE — Telephone Encounter (Signed)
Patient's wife [name] called to r/s colonoscopy  Scheduled in 08-2019. The only days he's not able to do is 02-25-2020 & 03-07-20.

## 2020-02-11 NOTE — Patient Instructions (Addendum)
Start Augmentin antibiotic 10 days F/u with Pulmonology about oxygen delivery may need mask or some other option If not improving return sooner or to pulm or consider COVID testing or emergency/urgent care if severe   Please schedule a Follow-up Appointment to: Return in about 1 week (around 02/18/2020), or if symptoms worsen or fail to improve, for sinus.  If you have any other questions or concerns, please feel free to call the office or send a message through [place]. You may also schedule an earlier appointment if necessary.  Additionally, you may be receiving a survey about your experience at our office within a few days to 1 week by e-mail or mail. We value your feedback.  Nobie Putnam, DO [place]

## 2020-02-11 NOTE — Progress Notes (Signed)
Virtual Visit via Telephone The purpose of this virtual visit is to provide medical care while limiting exposure to the novel coronavirus (COVID19) for both patient and office staff.  Consent was obtained for phone visit:  Yes.   Answered questions that patient had about telehealth interaction:  Yes.   I discussed the limitations, risks, security and privacy concerns of performing an evaluation and management service by telephone. I also discussed with the patient that there may be a patient responsible charge related to this service. The patient expressed understanding and agreed to proceed.  Patient Location: Home Provider Location: [facility])  ---------------------------------------------------------------------- Chief Complaint  Patient presents with  . Nasal Congestion    sinusitis, HA, nasal congestion, right side of throat hurts bur denies fever onset couple of days wasn't huring at this last apt but geting worst today     S: Reviewed CMA documentation. I have called patient and gathered additional HPI as follows:  Sinusitis, acute Reports that symptoms started with Right sided deeper sinus pain pressure congestion, admits some lymph node or glandular swelling. Feels like prior sinus infection. No fever at this time. - Taking Prednisone 10mg  daily now Patient was last seen 6/23 for Chronic ILD, and follow-up, he had CXR that was negative, sent to Pulmonology, he is on high flow oxygen also having some nasal bleeding epistaxis and dry nose with the congestion, asking about options for this. They use nasal saline and spray UTD on COVID vaccine No sick contacts   Denies any known or suspected exposure to person with or possibly with COVID19.  Denies any fevers, chills, sweats, body ache, cough, shortness of breath, headache, abdominal pain, diarrhea  Past Medical History:  Diagnosis Date  . Arthritis   . COPD (chronic obstructive pulmonary disease)  ([place])   . Coronary artery disease   . Dyspnea   . Emphysema lung ([place])   . Hyperlipidemia   . Pulmonary filariasis    Social History   Tobacco Use  . Smoking status: Former Smoker    Packs/day: 1.50    Years: 30.00    Pack years: 45.00    Types: Cigarettes    Quit date: 2007    Years since quitting: 14.4  . Smokeless tobacco: Former Systems developer    Types: Chew    Quit date: 2007  . Tobacco comment: Dip smokeless tobacco >20-30 years  Vaping Use  . Vaping Use: Never used  Substance Use Topics  . Alcohol use: Yes    Comment: occ  . Drug use: No    Current Outpatient Medications:  .  albuterol (PROAIR HFA) 108 (90 Base) MCG/ACT inhaler, INHALE 2 PUFFS INTO THE LUNGS EVERY 4 HOURS AS NEEDED FOR WHEEZING OR SHORTNESS OF BREATH OR COUGH, Disp: , Rfl:  .  apixaban (ELIQUIS) 5 MG TABS tablet, Take 1 tablet (5 mg total) by mouth 2 (two) times daily., Disp: 60 tablet, Rfl: 0 .  azaTHIOprine (IMURAN) 50 MG tablet, Take 2 tablets (100 mg total) by mouth daily., Disp: 180 tablet, Rfl: 0 .  diclofenac Sodium (VOLTAREN) 1 % GEL, Apply 2 g topically 3 (three) times daily as needed. (Patient taking differently: Apply 2 g topically 3 (three) times daily as needed (Pain (Arthritis)). ), Disp: 100 g, Rfl: 2 .  Multiple Vitamin (MULTIVITAMIN) tablet, Take 1 tablet by mouth daily., Disp: , Rfl:  .  Nintedanib (OFEV) 150 MG CAPS, Please take 1 tablet oral daily for 1 week, then resume back to 1  tablet twice daily after  that, Disp: 60 capsule, Rfl: 11 .  OXYGEN, Inhale into the lungs as needed., Disp: , Rfl:  .  pantoprazole (PROTONIX) 40 MG tablet, Take 1 tablet (40 mg total) by mouth daily., Disp: 30 tablet, Rfl: 1 .  predniSONE (DELTASONE) 10 MG tablet, Take 60 mg daily for 5 days, then 40 mg oral daily for 5 days, then 20 mg oral daily for 5 days, then 10 mg oral daily for 5 days., Disp: 65 tablet, Rfl: 0 .  Tiotropium Bromide Monohydrate (SPIRIVA RESPIMAT) 2.5 MCG/ACT AERS, Inhale 2 puffs into the lungs  daily., Disp: 1 g, Rfl: 5 .  valACYclovir (VALTREX) 500 MG tablet, Take 1 tablet (500 mg total) by mouth 2 (two) times daily as needed (herpes flare). For 3-7 days as needed for flare, Disp: 30 tablet, Rfl: 1 .  amoxicillin-clavulanate (AUGMENTIN) 875-125 MG tablet, Take 1 tablet by mouth 2 (two) times daily. For 10 days, Disp: 20 tablet, Rfl: 0 .  ezetimibe (ZETIA) 10 MG tablet, Take 1 tablet (10 mg total) by mouth daily., Disp: 90 tablet, Rfl: 3  Depression screen [facility] 2/9 12/08/2019 12/01/2019 10/28/2019  Decreased Interest 0 0 1  Down, Depressed, Hopeless 1 0 0  PHQ - 2 Score 1 0 1  Altered sleeping 1 1 2   Tired, decreased energy 1 1 1   Change in appetite 0 0 0  Feeling bad or failure about yourself  0 1 1  Trouble concentrating 0 0 0  Moving slowly or fidgety/restless 1 1 1   Suicidal thoughts 0 0 2  PHQ-9 Score 4 4 8   Difficult doing work/chores Not difficult at all - Not difficult at all    No flowsheet data found.  -------------------------------------------------------------------------- O: No physical exam performed due to remote telephone encounter.  Lab results reviewed.  I have personally reviewed the radiology report from 02/09/20 CXR.  CLINICAL DATA:  Chronic interstitial lung disease.  EXAM: CHEST - 2 VIEW  COMPARISON:  January 19, 2020.  FINDINGS: The heart size and mediastinal contours are within normal limits. No pneumothorax or pleural effusion is noted. Stable interstitial reticular densities are noted throughout both lungs most consistent with chronic interstitial lung disease or fibrosis. No definite acute abnormality is noted. The visualized skeletal structures are unremarkable.  IMPRESSION: Stable chronic interstitial lung disease or fibrosis. No definite acute abnormality is noted.   Electronically Signed   By: Marijo Conception M.D.   On: 02/10/2020 16:10  Recent Results (from the past 2160 hour(s))  CBC with Differential/Platelet     Status:  Abnormal   Collection Time: 11/18/19 10:30 AM  Result Value Ref Range   WBC 14.3 (H) 3.8 - 10.8 Thousand/uL   RBC 5.21 4.20 - 5.80 Million/uL   Hemoglobin 16.8 13.2 - 17.1 g/dL   HCT 49.4 38 - 50 %   MCV 94.8 80.0 - 100.0 fL   MCH 32.2 27.0 - 33.0 pg   MCHC 34.0 32.0 - 36.0 g/dL   RDW 12.5 11.0 - 15.0 %   Platelets 335 140 - 400 Thousand/uL   MPV 9.6 7.5 - 12.5 fL   Neutro Abs 8,737 (H) 1,500 - 7,800 cells/uL   Lymphs Abs 4,190 (H) 850 - 3,900 cells/uL   Absolute Monocytes 1,101 (H) 200 - 950 cells/uL   Eosinophils Absolute 215 15 - 500 cells/uL   Basophils Absolute 57 0 - 200 cells/uL   Neutrophils Relative % 61.1 %   Total Lymphocyte 29.3 %  Monocytes Relative 7.7 %   Eosinophils Relative 1.5 %   Basophils Relative 0.4 %  COMPLETE METABOLIC PANEL WITH GFR     Status: Abnormal   Collection Time: 11/18/19 10:30 AM  Result Value Ref Range   Glucose, Bld 74 65 - 99 mg/dL    Comment: .            Fasting reference interval .    BUN 24 7 - 25 mg/dL   Creat 0.84 0.70 - 1.25 mg/dL    Comment: For patients >27 years of age, the reference limit for Creatinine is approximately 13% higher for people identified as African-American. .    GFR, Est Non African American 92 > OR = 60 mL/min/1.22m2   GFR, Est African American 106 > OR = 60 mL/min/1.80m2   BUN/Creatinine Ratio NOT APPLICABLE 6 - 22 (calc)   Sodium 140 135 - 146 mmol/L   Potassium 4.8 3.5 - 5.3 mmol/L   Chloride 102 98 - 110 mmol/L   CO2 26 20 - 32 mmol/L   Calcium 9.4 8.6 - 10.3 mg/dL   Total Protein 7.1 6.1 - 8.1 g/dL   Albumin 3.5 (L) 3.6 - 5.1 g/dL   Globulin 3.6 1.9 - 3.7 g/dL (calc)   AG Ratio 1.0 1.0 - 2.5 (calc)   Total Bilirubin 0.4 0.2 - 1.2 mg/dL   Alkaline phosphatase (APISO) 97 35 - 144 U/L   AST 20 10 - 35 U/L   ALT 18 9 - 46 U/L  Hepatic function panel     Status: None   Collection Time: 12/02/19 10:54 AM  Result Value Ref Range   Total Bilirubin 0.5 0.2 - 1.2 mg/dL   Bilirubin, Direct 0.1 0.0 -  0.3 mg/dL   Alkaline Phosphatase 88 39 - 117 U/L   AST 25 0 - 37 U/L   ALT 29 0 - 53 U/L   Total Protein 7.4 6.0 - 8.3 g/dL   Albumin 3.9 3.5 - 5.2 g/dL  Basic metabolic panel     Status: Abnormal   Collection Time: 01/19/20  2:28 PM  Result Value Ref Range   Sodium 136 135 - 145 mmol/L   Potassium 4.9 3.5 - 5.1 mmol/L   Chloride 102 98 - 111 mmol/L   CO2 25 22 - 32 mmol/L   Glucose, Bld 142 (H) 70 - 99 mg/dL    Comment: Glucose reference range applies only to samples taken after fasting for at least 8 hours.   BUN 34 (H) 8 - 23 mg/dL   Creatinine, Ser 1.17 0.61 - 1.24 mg/dL   Calcium 9.5 8.9 - 10.3 mg/dL   GFR calc non Af Amer >60 >60 mL/min   GFR calc Af Amer >60 >60 mL/min   Anion gap 9 5 - 15    Comment: Performed at [facility] [address], [place] [address]  CBC     Status: Abnormal   Collection Time: 01/19/20  2:28 PM  Result Value Ref Range   WBC 19.5 (H) 4.0 - 10.5 K/uL   RBC 4.69 4.22 - 5.81 MIL/uL   Hemoglobin 14.9 13.0 - 17.0 g/dL   HCT 45.7 39 - 52 %   MCV 97.4 80.0 - 100.0 fL   MCH 31.8 26.0 - 34.0 pg   MCHC 32.6 30.0 - 36.0 g/dL   RDW 13.2 11.5 - 15.5 %   Platelets 490 (H) 150 - 400 K/uL   nRBC 0.0 0.0 - 0.2 %  Comment: Performed at [facility], [place] [address], [place], [place] [address]  Troponin I (High Sensitivity)     Status: None   Collection Time: 01/19/20  2:28 PM  Result Value Ref Range   Troponin I (High Sensitivity) 15 <18 ng/L    Comment: (NOTE) Elevated high sensitivity troponin I (hsTnI) values and significant  changes across serial measurements may suggest ACS but many other  chronic and acute conditions are known to elevate hsTnI results.  Refer to the "Links" section for chest pain algorithms and additional  guidance. Performed at [facility], [place] [address] [place], [place] [address]   Troponin I (High Sensitivity)     Status: None   Collection Time: 01/19/20  4:55 PM  Result Value Ref Range    Troponin I (High Sensitivity) 15 <18 ng/L    Comment: (NOTE) Elevated high sensitivity troponin I (hsTnI) values and significant  changes across serial measurements may suggest ACS but many other  chronic and acute conditions are known to elevate hsTnI results.  Refer to the "Links" section for chest pain algorithms and additional  guidance. Performed at [facility], [place] [address] [place],  [address]   Procalcitonin     Status: None   Collection Time: 01/19/20  4:55 PM  Result Value Ref Range   Procalcitonin <0.10 ng/mL    Comment:        Interpretation: PCT (Procalcitonin) <= 0.5 ng/mL: Systemic infection (sepsis) is not likely. Local bacterial infection is possible. (NOTE)       Sepsis PCT Algorithm           Lower Respiratory Tract                                      Infection PCT Algorithm    ----------------------------     ----------------------------         PCT < 0.25 ng/mL                PCT < 0.10 ng/mL         Strongly encourage             Strongly discourage   discontinuation of antibiotics    initiation of antibiotics    ----------------------------     -----------------------------       PCT 0.25 - 0.50 ng/mL            PCT 0.10 - 0.25 ng/mL               OR       >80% decrease in PCT            Discourage initiation of                                            antibiotics      Encourage discontinuation           of antibiotics    ----------------------------     -----------------------------         PCT >= 0.50 ng/mL              PCT 0.26 - 0.50 ng/mL               AND        <  80% decrease in PCT             Encourage initiation of                                             antibiotics       Encourage continuation           of antibiotics    ----------------------------     -----------------------------        PCT >= 0.50 ng/mL                  PCT > 0.50 ng/mL               AND         increase in PCT                  Strongly encourage                                       initiation of antibiotics    Strongly encourage escalation           of antibiotics                                     -----------------------------                                           PCT <= 0.25 ng/mL                                                 OR                                        > 80% decrease in PCT                                     Discontinue / Do not initiate                                             antibiotics Performed at [facility], [address], [place], [place] [address]   Ferritin     Status: None   Collection Time: 01/19/20  6:56 PM  Result Value Ref Range   Ferritin 255 24 - 336 ng/mL    Comment: Performed at [facility] [address], [place], [place] [address]  HIV Antibody (routine testing w rflx)     Status: None   Collection Time: 01/19/20  6:56 PM  Result Value Ref Range   HIV Screen 4th Generation wRfx Non Reactive Non Reactive    Comment: Performed at [facility], [place]  [address] [place], [place] [address]  ABO/Rh     Status: None   Collection Time: 01/19/20  6:56 PM  Result Value Ref Range   ABO/RH(D)      O POS Performed at [facility] [address] [place], [place] [address]   Ferritin     Status: None   Collection Time: 01/20/20  5:56 AM  Result Value Ref Range   Ferritin 229 24 - 336 ng/mL    Comment: Performed at [facility] [address], [place], [place] [address]  D-dimer, quantitative (not at [facility])     Status: Abnormal   Collection Time: 01/20/20  5:56 AM  Result Value Ref Range   D-Dimer, Quant 1.36 (H) 0.00 - 0.50 ug/mL-FEU    Comment: (NOTE) At the manufacturer cut-off of 0.50 ug/mL FEU, this assay has been documented to exclude PE with a sensitivity and negative predictive value of 97 to 99%.  At this time, this assay has not been approved by the FDA to exclude DVT/VTE. Results should be correlated with clinical presentation. Performed  at [facility], [place] [address], [place], [place] [address]   C-reactive protein     Status: Abnormal   Collection Time: 01/20/20  5:56 AM  Result Value Ref Range   CRP 11.3 (H) <1.0 mg/dL    Comment: Performed at [facility] [address] [place], [place] [address]  Comprehensive metabolic panel     Status: Abnormal   Collection Time: 01/20/20  5:56 AM  Result Value Ref Range   Sodium 138 135 - 145 mmol/L   Potassium 5.0 3.5 - 5.1 mmol/L   Chloride 103 98 - 111 mmol/L   CO2 26 22 - 32 mmol/L   Glucose, Bld 130 (H) 70 - 99 mg/dL    Comment: Glucose reference range applies only to samples taken after fasting for at least 8 hours.   BUN 33 (H) 8 - 23 mg/dL   Creatinine, Ser 0.81 0.61 - 1.24 mg/dL   Calcium 9.1 8.9 - 10.3 mg/dL   Total Protein 6.4 (L) 6.5 - 8.1 g/dL   Albumin 2.5 (L) 3.5 - 5.0 g/dL   AST 17 15 - 41 U/L   ALT 15 0 - 44 U/L   Alkaline Phosphatase 103 38 - 126 U/L   Total Bilirubin 0.5 0.3 - 1.2 mg/dL   GFR calc non Af Amer >60 >60 mL/min   GFR calc Af Amer >60 >60 mL/min   Anion gap 9 5 - 15    Comment: Performed at [facility], [place] [address], [place], [place] [address]  CBC with Differential/Platelet     Status: Abnormal   Collection Time: 01/20/20  5:56 AM  Result Value Ref Range   WBC 13.1 (H) 4.0 - 10.5 K/uL   RBC 4.39 4.22 - 5.81 MIL/uL   Hemoglobin 14.3 13.0 - 17.0 g/dL   HCT 41.7 39 - 52 %   MCV 95.0 80.0 - 100.0 fL   MCH 32.6 26.0 - 34.0 pg   MCHC 34.3 30.0 - 36.0 g/dL   RDW 12.9 11.5 - 15.5 %   Platelets 427 (H) 150 - 400 K/uL   nRBC 0.0 0.0 - 0.2 %   Neutrophils Relative % 72 %   Neutro Abs 9.5 (H) 1.7 - 7.7 K/uL   Lymphocytes Relative 21 %   Lymphs Abs 2.7 0.7 - 4.0 K/uL   Monocytes Relative 6 %   Monocytes  Absolute 0.8 0 - 1 K/uL   Eosinophils Relative 0 %   Eosinophils Absolute 0.0 0 - 0 K/uL   Basophils Relative 0 %   Basophils Absolute 0.0 0 - 0 K/uL   Immature Granulocytes 1 %   Abs Immature Granulocytes 0.07  0.00 - 0.07 K/uL    Comment: Performed at [facility] [address] [place], [place] [address]  ECHOCARDIOGRAM COMPLETE     Status: None   Collection Time: 01/20/20  2:21 PM  Result Value Ref Range   Weight 2,525.59 oz   Height 71 in   BP 113/69 mmHg  Ferritin     Status: None   Collection Time: 01/21/20  8:18 AM  Result Value Ref Range   Ferritin 289 24 - 336 ng/mL    Comment: Performed at [facility] [address], [place], [place] [address]  D-dimer, quantitative (not at [facility])     Status: Abnormal   Collection Time: 01/21/20  8:18 AM  Result Value Ref Range   D-Dimer, Quant 1.01 (H) 0.00 - 0.50 ug/mL-FEU    Comment: (NOTE) At the manufacturer cut-off of 0.50 ug/mL FEU, this assay has been documented to exclude PE with a sensitivity and negative predictive value of 97 to 99%.  At this time, this assay has not been approved by the FDA to exclude DVT/VTE. Results should be correlated with clinical presentation. Performed at [facility], [place] [address], [place], [place] [address]   C-reactive protein     Status: Abnormal   Collection Time: 01/21/20  8:18 AM  Result Value Ref Range   CRP 6.0 (H) <1.0 mg/dL    Comment: Performed at [facility] [address] [place], [place] [address]  Comprehensive metabolic panel     Status: Abnormal   Collection Time: 01/21/20  8:18 AM  Result Value Ref Range   Sodium 141 135 - 145 mmol/L   Potassium 4.3 3.5 - 5.1 mmol/L   Chloride 102 98 - 111 mmol/L   CO2 26 22 - 32 mmol/L   Glucose, Bld 137 (H) 70 - 99 mg/dL    Comment: Glucose reference range applies only to samples taken after fasting for at least 8 hours.   BUN 30 (H) 8 - 23 mg/dL   Creatinine, Ser 0.79 0.61 - 1.24 mg/dL   Calcium 9.4 8.9 - 10.3 mg/dL   Total Protein 7.1 6.5 - 8.1 g/dL   Albumin 2.8 (L) 3.5 - 5.0 g/dL   AST 21 15 - 41 U/L   ALT 17 0 - 44 U/L   Alkaline Phosphatase 110 38 - 126 U/L   Total Bilirubin 0.5 0.3 - 1.2 mg/dL   GFR  calc non Af Amer >60 >60 mL/min   GFR calc Af Amer >60 >60 mL/min   Anion gap 13 5 - 15    Comment: Performed at [facility], [place] [address], [place], [place] [address]  CBC with Differential/Platelet     Status: Abnormal   Collection Time: 01/21/20  8:18 AM  Result Value Ref Range   WBC 14.4 (H) 4.0 - 10.5 K/uL   RBC 4.77 4.22 - 5.81 MIL/uL   Hemoglobin 15.3 13.0 - 17.0 g/dL   HCT 45.7 39 - 52 %   MCV 95.8 80.0 - 100.0 fL   MCH 32.1 26.0 - 34.0 pg   MCHC 33.5 30.0 - 36.0 g/dL   RDW 13.1 11.5 - 15.5 %  Platelets 508 (H) 150 - 400 K/uL   nRBC 0.0 0.0 - 0.2 %   Neutrophils Relative % 79 %   Neutro Abs 11.5 (H) 1.7 - 7.7 K/uL   Lymphocytes Relative 16 %   Lymphs Abs 2.3 0.7 - 4.0 K/uL   Monocytes Relative 4 %   Monocytes Absolute 0.5 0 - 1 K/uL   Eosinophils Relative 0 %   Eosinophils Absolute 0.0 0 - 0 K/uL   Basophils Relative 0 %   Basophils Absolute 0.0 0 - 0 K/uL   Immature Granulocytes 1 %   Abs Immature Granulocytes 0.11 (H) 0.00 - 0.07 K/uL    Comment: Performed at [facility] [address] [place], [place] [address]  Brain natriuretic peptide     Status: Abnormal   Collection Time: 01/21/20  8:18 AM  Result Value Ref Range   B Natriuretic Peptide 271.4 (H) 0.0 - 100.0 pg/mL    Comment: Performed at [facility] [address] [place], [place] [address]  Ferritin     Status: None   Collection Time: 01/22/20  4:54 AM  Result Value Ref Range   Ferritin 278 24 - 336 ng/mL    Comment: Performed at [facility] [address], [place], [place] [address]  D-dimer, quantitative (not at [facility])     Status: Abnormal   Collection Time: 01/22/20  4:54 AM  Result Value Ref Range   D-Dimer, Quant 0.70 (H) 0.00 - 0.50 ug/mL-FEU    Comment: (NOTE) At the manufacturer cut-off of 0.50 ug/mL FEU, this assay has been documented to exclude PE with a sensitivity and negative predictive value of 97 to 99%.  At this time, this assay has not been  approved by the FDA to exclude DVT/VTE. Results should be correlated with clinical presentation. Performed at [facility], [place] [address], [place], [place] [address]   C-reactive protein     Status: Abnormal   Collection Time: 01/22/20  4:54 AM  Result Value Ref Range   CRP 2.3 (H) <1.0 mg/dL    Comment: Performed at [facility] [address] [place], [place] [address]  Comprehensive metabolic panel     Status: Abnormal   Collection Time: 01/22/20  4:54 AM  Result Value Ref Range   Sodium 140 135 - 145 mmol/L   Potassium 4.9 3.5 - 5.1 mmol/L   Chloride 102 98 - 111 mmol/L   CO2 29 22 - 32 mmol/L   Glucose, Bld 153 (H) 70 - 99 mg/dL    Comment: Glucose reference range applies only to samples taken after fasting for at least 8 hours.   BUN 39 (H) 8 - 23 mg/dL   Creatinine, Ser 0.85 0.61 - 1.24 mg/dL   Calcium 9.2 8.9 - 10.3 mg/dL   Total Protein 6.3 (L) 6.5 - 8.1 g/dL   Albumin 2.6 (L) 3.5 - 5.0 g/dL   AST 20 15 - 41 U/L   ALT 21 0 - 44 U/L   Alkaline Phosphatase 91 38 - 126 U/L   Total Bilirubin 0.5 0.3 - 1.2 mg/dL   GFR calc non Af Amer >60 >60 mL/min   GFR calc Af Amer >60 >60 mL/min   Anion gap 9 5 - 15    Comment: Performed at [facility] [address] [place], [place] [address]  CBC with Differential/Platelet     Status: Abnormal   Collection Time: 01/22/20  4:54 AM  Result Value Ref Range   WBC 20.7 (H) 4.0 - 10.5 K/uL   RBC 4.39 4.22 - 5.81 MIL/uL   Hemoglobin 14.1 13.0 - 17.0 g/dL   HCT 42.7 39 - 52 %   MCV 97.3 80.0 - 100.0 fL   MCH 32.1 26.0 - 34.0 pg   MCHC 33.0 30.0 - 36.0 g/dL   RDW 13.0 11.5 - 15.5 %   Platelets 558 (H) 150 - 400 K/uL   nRBC 0.0 0.0 - 0.2 %   Neutrophils Relative % 84 %   Neutro Abs 17.4 (H) 1.7 - 7.7 K/uL   Lymphocytes Relative 12 %   Lymphs Abs 2.4 0.7 - 4.0 K/uL   Monocytes Relative 3 %   Monocytes Absolute 0.7 0 - 1 K/uL   Eosinophils Relative 0 %   Eosinophils Absolute 0.0 0 - 0 K/uL   Basophils  Relative 0 %   Basophils Absolute 0.0 0 - 0 K/uL   Immature Granulocytes 1 %   Abs Immature Granulocytes 0.16 (H) 0.00 - 0.07 K/uL    Comment: Performed at [facility], [address], [place], [place] [address]  Ferritin     Status: None   Collection Time: 01/23/20  2:02 AM  Result Value Ref Range   Ferritin 240 24 - 336 ng/mL    Comment: Performed at [facility], [place] [address], [place], [place] [address]  D-dimer, quantitative (not at [facility])     Status: Abnormal   Collection Time: 01/23/20  2:02 AM  Result Value Ref Range   D-Dimer, Quant 0.53 (H) 0.00 - 0.50 ug/mL-FEU    Comment: (NOTE) At the manufacturer cut-off of 0.50 ug/mL FEU, this assay has been documented to exclude PE with a sensitivity and negative predictive value of 97 to 99%.  At this time, this assay has not been approved by the FDA to exclude DVT/VTE. Results should be correlated with clinical presentation. Performed at [facility], [place] [address], [place], [place] [address]   C-reactive protein     Status: Abnormal   Collection Time: 01/23/20  2:02 AM  Result Value Ref Range   CRP 1.1 (H) <1.0 mg/dL    Comment: Performed at [facility], [place] [address] [place], [place] [address]  CBC     Status: Abnormal   Collection Time: 01/23/20  2:02 AM  Result Value Ref Range   WBC 19.6 (H) 4.0 - 10.5 K/uL   RBC 4.11 (L) 4.22 - 5.81 MIL/uL   Hemoglobin 13.1 13.0 - 17.0 g/dL   HCT 39.5 39 - 52 %   MCV 96.1 80.0 - 100.0 fL   MCH 31.9 26.0 - 34.0 pg   MCHC 33.2 30.0 - 36.0 g/dL   RDW 12.7 11.5 - 15.5 %   Platelets 472 (H) 150 - 400 K/uL   nRBC 0.0 0.0 - 0.2 %    Comment: Performed at [facility], [place] [address], [place], [place] [address]  Comprehensive metabolic panel     Status: Abnormal   Collection Time: 01/23/20  2:02 AM  Result Value Ref Range   Sodium 136 135 - 145 mmol/L   Potassium 4.7 3.5 - 5.1 mmol/L   Chloride 98 98 - 111 mmol/L   CO2 28 22 - 32 mmol/L   Glucose, Bld  152 (H) 70 - 99 mg/dL    Comment: Glucose reference range applies only to samples taken after fasting for at least 8 hours.  BUN 36 (H) 8 - 23 mg/dL   Creatinine, Ser 0.85 0.61 - 1.24 mg/dL   Calcium 9.0 8.9 - 10.3 mg/dL   Total Protein 5.8 (L) 6.5 - 8.1 g/dL   Albumin 2.6 (L) 3.5 - 5.0 g/dL   AST 33 15 - 41 U/L   ALT 41 0 - 44 U/L   Alkaline Phosphatase 78 38 - 126 U/L   Total Bilirubin 0.2 (L) 0.3 - 1.2 mg/dL   GFR calc non Af Amer >60 >60 mL/min   GFR calc Af Amer >60 >60 mL/min   Anion gap 10 5 - 15    Comment: Performed at [facility], [place] [address], [place], [place] [address]  Comprehensive metabolic panel     Status: Abnormal   Collection Time: 02/08/20  4:45 PM  Result Value Ref Range   Sodium 135 135 - 145 mEq/L   Potassium 4.8 3.5 - 5.1 mEq/L   Chloride 100 96 - 112 mEq/L   CO2 29 19 - 32 mEq/L   Glucose, Bld 105 (H) 70 - 99 mg/dL   BUN 32 (H) 6 - 23 mg/dL   Creatinine, Ser 0.76 0.40 - 1.50 mg/dL   Total Bilirubin 0.3 0.2 - 1.2 mg/dL   Alkaline Phosphatase 83 39 - 117 U/L   AST 21 0 - 37 U/L   ALT 17 0 - 53 U/L   Total Protein 6.9 6.0 - 8.3 g/dL   Albumin 3.9 3.5 - 5.2 g/dL   GFR 102.61 >60.00 mL/min   Calcium 9.5 8.4 - 10.5 mg/dL  Brain natriuretic peptide     Status: Abnormal   Collection Time: 02/08/20  4:45 PM  Result Value Ref Range   Pro B Natriuretic peptide (BNP) 122.0 (H) 0.0 - 100.0 pg/mL  Hepatitis B surface antibody,qualitative     Status: None   Collection Time: 02/09/20 11:47 AM  Result Value Ref Range   Hep B S Ab NON-REACTIVE NON-REACTI  Measles/Mumps/Rubella Immunity     Status: None   Collection Time: 02/09/20 11:47 AM  Result Value Ref Range   Rubeola IgG 113.00 AU/mL    Comment: AU/mL            Interpretation -----            -------------- <13.50           Not consistent with immunity 13.50-16.49      Equivocal >16.49           Consistent with immunity . The presence of measles IgG suggests immunization or past or current  infection with measles virus. . For additional information, please refer to http://education.[facility].com/faq/FAQ162 (This link is being provided for informational/ educational purposes only.) .    Mumps IgG 62.50 AU/mL    Comment:  AU/mL           Interpretation -------         ---------------- <9.00             Not consistent with immunity 9.00-10.99        Equivocal >10.99            Consistent with immunity . The presence of mumps IgG antibody suggests immunization or past or current infection with mumps virus. .    Rubella 6.59 Index    Comment:     Index            Interpretation     -----            --------------       <  0.90            Not consistent with immunity     0.90-0.99        Equivocal     > or = 1.00      Consistent with immunity  . The presence of rubella IgG antibody suggests  immunization or past or current infection with rubella virus.     -------------------------------------------------------------------------- A&P:  Problem List Items Addressed This Visit    None    Visit Diagnoses    Acute non-recurrent frontal sinusitis    -  Primary   Relevant Medications   amoxicillin-clavulanate (AUGMENTIN) 875-125 MG tablet     Consistent with acute frontal sinusitis, likely initially viral URI vs allergic rhinitis component with worsening concern for bacterial infection. 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 Nasal saline, and current therapy while on oxygen, advised to ask Pulmonology about other O2 delivery options given his high flow oxygen Return criteria reviewed   Meds ordered this encounter  Medications  .  amoxicillin-clavulanate (AUGMENTIN) 875-125 MG tablet    Sig: Take 1 tablet by mouth 2 (two) times daily. For 10 days    Dispense:  20 tablet    Refill:  0    Follow-up: As needed within 1 week or can f/u with Pulmonology sooner  Patient verbalizes understanding with the above medical recommendations including the limitation of remote medical advice.  Specific follow-up and call-back criteria were given for patient to follow-up or seek medical care more urgently if needed.   - Time spent in direct consultation with patient on phone: 9 minutes   Nobie Putnam, [facility] 02/11/2020, 10:52 AM

## 2020-02-13 NOTE — Telephone Encounter (Signed)
The 15Lexertion need appears to be new but related to post covid hospitaliation. OVerall seems stable and has seen PCP Olin Hauser, DO since then.Going to see [facility] transplant tomorrow Dr Lincoln Brigham  Plan  - give aug 2021 visit at [facility] clinic (schedule will open up soon)

## 2020-02-14 NOTE — Telephone Encounter (Signed)
Pt is aware of below message and voiced his understanding.  He is aware that appt is needed in Aug, however schedule is not out as of yet.  Advised pt he will be receiving a call from our office soon to schedule an appt.  Will hold message in triage for f/u.

## 2020-02-15 DIAGNOSIS — M21379 Foot drop, unspecified foot: Secondary | ICD-10-CM | POA: Insufficient documentation

## 2020-02-15 NOTE — Assessment & Plan Note (Addendum)
-   Stable; Patient tested positive for COVID in May 2021. He has resummed antifibrotic medication and is tolerating. Reports increased oxygen demand during 6MWT with [facility]. Today his O2 saturation stayed above 88-90% on 3L.  -  01/20/20 HRCT- appearance of the lungs is very similar to the prior study with a spectrum of findings considered diagnostic of usual interstitial pneumonia (UIP)  - CMET within normal limits today  - Continue OFEV 150mg  twice daily - Ambulatory walk today showed that he maintained oxygen saturation 90% on 3L after 2 laps - Will send in DME order for larger tanks and patient to contact [employer] for continuous portable concentrator  - 6 weeks with Dr. Chase Caller 30-minute slot ILD clinic

## 2020-02-15 NOTE — Telephone Encounter (Signed)
Please call patient to r/s colonoscopy.

## 2020-02-15 NOTE — Assessment & Plan Note (Signed)
-   Refer to Neurology for left foot drop and headaches

## 2020-02-15 NOTE — Progress Notes (Signed)
Please let patient know CMET was normal. Continue OFEV. His BNP was mildly elevated but improved from early June, recommend he follow-up with cardiolgy, this could be causing some of his symptoms. CXR showed stable fibrosis, no acute process.

## 2020-02-16 ENCOUNTER — Other Ambulatory Visit: Payer: Self-pay

## 2020-02-16 DIAGNOSIS — J849 Interstitial pulmonary disease, unspecified: Secondary | ICD-10-CM | POA: Diagnosis not present

## 2020-02-16 DIAGNOSIS — Z1211 Encounter for screening for malignant neoplasm of colon: Secondary | ICD-10-CM

## 2020-02-16 NOTE — Progress Notes (Signed)
Daily Session Note  Patient Details  Name: Lawrence Wells MRN: [id number] Date of Birth: May 08, 1956 Referring Provider:     Pulmonary Rehab from 10/28/2019 in [facility] Cardiac and Pulmonary Rehab  Referring Provider Lawrence Males MD      Encounter Date: 02/16/2020  Check In:  Session Check In - 02/16/20 0750      Check-In   Supervising physician immediately available to respond to emergencies See telemetry face sheet for immediately available ER MD    Location [facility]-Cardiac & Pulmonary Rehab    Staff Present Justin Mend RCP,RRT,BSRT;Vida Rigger RN, Vickki Hearing, BA, ACSM CEP, Exercise Physiologist;Jessica Saxman, MA, RCEP, CCRP, CCET    Virtual Visit No    Medication changes reported     No    Fall or balance concerns reported    No    Warm-up and Cool-down Performed on first and last piece of equipment    Resistance Training Performed Yes    VAD Patient? No    PAD/SET Patient? No      Pain Assessment   Currently in Pain? No/denies              Social History   Tobacco Use  Smoking Status Former Smoker  . Packs/day: 1.50  . Years: 30.00  . Pack years: 45.00  . Types: Cigarettes  . Quit date: 2007  . Years since quitting: 14.5  Smokeless Tobacco Former Systems developer  . Types: Chew  . Quit date: 2007  Tobacco Comment   Dip smokeless tobacco >20-30 years    Goals Met:  Independence with exercise equipment Exercise tolerated well No report of cardiac concerns or symptoms Strength training completed today  Goals Unmet:  O2 Sat  Comments: Pt able to follow exercise prescription today without complaint.  Will continue to monitor for progression. Pt arrived without oxygen, pt educated on the importance of keeping SPO2 above 88%.   Dr. Emily Filbert is Medical Director for [place] and [facility].

## 2020-02-16 NOTE — Progress Notes (Signed)
Spoke with the pt and made aware of results and recs per [facility] and he verbalized understanding.

## 2020-02-17 ENCOUNTER — Other Ambulatory Visit: Payer: Self-pay

## 2020-02-17 ENCOUNTER — Encounter: Payer: PPO | Attending: Internal Medicine | Admitting: *Deleted

## 2020-02-17 DIAGNOSIS — J849 Interstitial pulmonary disease, unspecified: Secondary | ICD-10-CM | POA: Diagnosis not present

## 2020-02-17 DIAGNOSIS — I2510 Atherosclerotic heart disease of native coronary artery without angina pectoris: Secondary | ICD-10-CM | POA: Insufficient documentation

## 2020-02-17 DIAGNOSIS — E785 Hyperlipidemia, unspecified: Secondary | ICD-10-CM | POA: Insufficient documentation

## 2020-02-17 DIAGNOSIS — Z7952 Long term (current) use of systemic steroids: Secondary | ICD-10-CM | POA: Diagnosis not present

## 2020-02-17 DIAGNOSIS — M1990 Unspecified osteoarthritis, unspecified site: Secondary | ICD-10-CM | POA: Diagnosis not present

## 2020-02-17 DIAGNOSIS — Z87891 Personal history of nicotine dependence: Secondary | ICD-10-CM | POA: Insufficient documentation

## 2020-02-17 DIAGNOSIS — Z79899 Other long term (current) drug therapy: Secondary | ICD-10-CM | POA: Insufficient documentation

## 2020-02-17 DIAGNOSIS — J439 Emphysema, unspecified: Secondary | ICD-10-CM | POA: Diagnosis not present

## 2020-02-17 NOTE — Progress Notes (Signed)
Daily Session Note  Patient Details  Name: Lawrence Wells MRN: [id number] Date of Birth: [birth year] Referring Provider:     Pulmonary Rehab from 10/28/2019 in [facility] Cardiac and Pulmonary Rehab  Referring Provider Brand Males MD      Encounter Date: 02/17/2020  Check In:  Session Check In - 02/17/20 0752      Check-In   Supervising physician immediately available to respond to emergencies See telemetry face sheet for immediately available ER MD    Location [facility]-Cardiac & Pulmonary Rehab    Staff Present Nyoka Cowden, RN, BSN, Willette Pa, MA, RCEP, CCRP, [place], [place], ACSM CEP, Exercise Physiologist    Virtual Visit No    Medication changes reported     No    Fall or balance concerns reported    No    Tobacco Cessation No Change    Warm-up and Cool-down Performed on first and last piece of equipment    Resistance Training Performed Yes    VAD Patient? No    PAD/SET Patient? No      Pain Assessment   Currently in Pain? No/denies              Social History   Tobacco Use  Smoking Status Former Smoker  . Packs/day: 1.50  . Years: 30.00  . Pack years: 45.00  . Types: Cigarettes  . Quit date: 2007  . Years since quitting: 14.5  Smokeless Tobacco Former Systems developer  . Types: Chew  . Quit date: 2007  Tobacco Comment   Dip smokeless tobacco >20-30 years    Goals Met:  Independence with exercise equipment Exercise tolerated well No report of cardiac concerns or symptoms Strength training completed today  Goals Unmet:  Not Applicable  Comments: Pt able to follow exercise prescription today without complaint.  Will continue to monitor for progression.   Dr. Emily Filbert is Medical Director for [place] and [facility].

## 2020-02-18 ENCOUNTER — Telehealth: Payer: Self-pay | Admitting: Primary Care

## 2020-02-18 MED ORDER — PREDNISONE 10 MG PO TABS
ORAL_TABLET | ORAL | 0 refills | Status: DC
Start: 2020-02-18 — End: 2020-08-04

## 2020-02-18 NOTE — Telephone Encounter (Signed)
He is a patient of Dr. Chase Caller.  Can we get him for a visit on Monday with for chest x-ray. Advise him to go to the ED if his breathing gets worse in the meantime.

## 2020-02-18 NOTE — Telephone Encounter (Signed)
Office is closed Monday. No APP has any openings Tuesday, 7/6. MR, please advise on recommendations.

## 2020-02-18 NOTE — Telephone Encounter (Signed)
Spoke with the pt and notified of recs per MR and he verbalized understanding  Rx for pred was sent  appt with BPM scheduled as televisit 02/24/20 No in person visit available

## 2020-02-18 NOTE — Telephone Encounter (Addendum)
He just had Covid in the setting of pulmonary fibrosis just a month ago.  This decline is worrisome and I believe he might be flaring up with his pulmonary fibrosis again.  He is also immunosuppressed with Imuran  Plan -Continue the Augmentin given for sinus issues  - Please take Take prednisone 40mg  once daily x 3 days, then 30mg  once daily x 3 days, then 20mg  once daily x 3 days, then prednisone 10mg  once daily  x 3 days and then to maintenance of 7.5 mg prednisone daily  -He needs to alert the [facility] transplant team if he is already locked in with them -in case his recent worsening is changing that plans  -He needs to have an extremely low threshold to go to the emergency room over the next 3 days ; closest emergency room.  In fact if he is feeling unwell and unsafe he needs to go to the emergency room but he wants to try the prednisone approach above he could do that  -He should see an app or an MD within the next 5 business days either in [place] or [place].  Preferably face-to-face if not video.  If not telephone

## 2020-02-18 NOTE — Telephone Encounter (Signed)
Spoke with patient, he states he was at pulmonary rehab on 02/17/20 on 6L of oxygen exerting himself doing weights and stretching.  He became very sob, had chest discomfort and was sweaty.  He denies have had any nausea or radiation of pain to arm/neck/back.  He states when he got home he was still sob, his sats were 79% on 6L and HR was 118.  While sitting his sats are usually 93-94% on RA, this morning while speaking with him he states his sats started out at 96%, dropped to 92% and then to 87%.  I advised him to put on the oxygen anytime his oxygen level is 88% or less.  He denied sob or cp this morning.  He did report coughing this morning with yellow phlem.  He saw his pcp for a sinus infection and has been on Augmentin for 6 days and feels this is from the sinus infection.  He is on a maintenance dose of prednisone of 7.5 mg daily.  He also reports some chest tightness this morning after using his Spiriva.  He had not used his Albuterol inhaler, advised to use his inhaler for any sob, wheezing, or chest tightness.  Advised if he had any further severe sob, cp especially along with sweating and/or nausea or pain with radiation to seek emergency medical treatment or call  911.   Dr. Vaughan Browner, please advise on if patient needs any further follow up/care.  Thank you.

## 2020-02-19 DIAGNOSIS — J849 Interstitial pulmonary disease, unspecified: Secondary | ICD-10-CM | POA: Diagnosis not present

## 2020-02-23 ENCOUNTER — Telehealth (INDEPENDENT_AMBULATORY_CARE_PROVIDER_SITE_OTHER): Payer: PPO | Admitting: Family Medicine

## 2020-02-23 ENCOUNTER — Other Ambulatory Visit: Payer: Self-pay

## 2020-02-23 ENCOUNTER — Encounter: Payer: PPO | Admitting: *Deleted

## 2020-02-23 ENCOUNTER — Encounter: Payer: Self-pay | Admitting: Family Medicine

## 2020-02-23 DIAGNOSIS — J849 Interstitial pulmonary disease, unspecified: Secondary | ICD-10-CM | POA: Diagnosis not present

## 2020-02-23 DIAGNOSIS — J321 Chronic frontal sinusitis: Secondary | ICD-10-CM

## 2020-02-23 MED ORDER — FLUTICASONE PROPIONATE 50 MCG/ACT NA SUSP: 2.0000 | Freq: Every day | NASAL | 3 refills | Status: DC

## 2020-02-23 MED ORDER — LEVOFLOXACIN 500 MG PO TABS: 500.0000 mg | ORAL_TABLET | Freq: Every day | ORAL | 0 refills | Status: DC

## 2020-02-23 NOTE — Progress Notes (Signed)
Daily Session Note  Patient Details  Name: SANJITH SIWEK MRN: [id number] Date of Birth: 08-29-1955 Referring Provider:     Pulmonary Rehab from 10/28/2019 in [facility] Cardiac and Pulmonary Rehab  Referring Provider Brand Males MD      Encounter Date: 02/23/2020  Check In:  Session Check In - 02/23/20 0720      Check-In   Supervising physician immediately available to respond to emergencies See telemetry face sheet for immediately available ER MD    Location [facility]-Cardiac & Pulmonary Rehab    Staff Present Renita Papa, RN BSN;Joseph [address] [place], [place], [place], CCRP, [place], [place], ACSM CEP, Exercise Physiologist    Virtual Visit No    Medication changes reported     No    Fall or balance concerns reported    No    Warm-up and Cool-down Performed on first and last piece of equipment    Resistance Training Performed Yes    VAD Patient? No    PAD/SET Patient? No      Pain Assessment   Currently in Pain? No/denies              Social History   Tobacco Use  Smoking Status Former Smoker  . Packs/day: 1.50  . Years: 30.00  . Pack years: 45.00  . Types: Cigarettes  . Quit date: 2007  . Years since quitting: 14.5  Smokeless Tobacco Former Systems developer  . Types: Chew  . Quit date: 2007  Tobacco Comment   Dip smokeless tobacco >20-30 years    Goals Met:  Independence with exercise equipment Exercise tolerated well No report of cardiac concerns or symptoms Strength training completed today  Goals Unmet:  Not Applicable  Comments: Pt able to follow exercise prescription today without complaint.  Will continue to monitor for progression.    Dr. Emily Filbert is Medical Director for [place] and [facility].

## 2020-02-23 NOTE — Progress Notes (Addendum)
Subjective:    Patient ID: Lawrence Wells, male    DOB: 27-May-1956, 64 y.o.   MRN: [id number]  Lawrence Wells is a 64 y.o. male presenting on 02/23/2020 for Sore Throat (nasal congestion, exhaustion, cough, yellowish mucus, denies fever or chills --Spo2 90--patient has Hx of pulmonary fibrosis, and pulse 73 bpm ) and Dysphagia (when he eats solid food hard to swollow --right side of his gland swollen up as per patient onset 2 weeks )  Virtual / Telehealth Encounter - Video Visit via MyChart The purpose of this virtual visit is to provide medical care while limiting exposure to the novel coronavirus (COVID19) for both patient and office staff.  Consent was obtained for remote visit:  Yes.   Answered questions that patient had about telehealth interaction:  Yes.   I discussed the limitations, risks, security and privacy concerns of performing an evaluation and management service by video/telephone. I also discussed with the patient that there may be a patient responsible charge related to this service. The patient expressed understanding and agreed to proceed.  Patient Location: Home Provider Location: [facility] (Office)   HPI   Sinusitis, acute - Last visit with me 02/11/20, for initial visit for same problem, virtual telephone visit, treated with Augmentin antibiotic, see prior notes for background information. - Interval update with some improvement then worsening again - Today patient reports now persistent deeper sinus congestion and pressure, some glandular swelling tonsil swelling by his report, difficulty with drainage in back of throat some difficulty swallowing. - Still on taper Prednisone now daily dose per Pulm Followed by Pulm for Chronic ILD started Pulm rehab Has supplemental O2 drying nasal - but has saline spray not using nasal steroid UTD on COVID vaccine No sick contacts Admits cough, productive sinus drainage Admits reduced energy Denies fever chills,  chest pain  Depression screen [facility] 2/9 12/08/2019 12/01/2019 10/28/2019  Decreased Interest 0 0 1  Down, Depressed, Hopeless 1 0 0  PHQ - 2 Score 1 0 1  Altered sleeping 1 1 2   Tired, decreased energy 1 1 1   Change in appetite 0 0 0  Feeling bad or failure about yourself  0 1 1  Trouble concentrating 0 0 0  Moving slowly or fidgety/restless 1 1 1   Suicidal thoughts 0 0 2  PHQ-9 Score 4 4 8   Difficult doing work/chores Not difficult at all - Not difficult at all    Social History   Tobacco Use  . Smoking status: Former Smoker    Packs/day: 1.50    Years: 30.00    Pack years: 45.00    Types: Cigarettes    Quit date: 2007    Years since quitting: 14.5  . Smokeless tobacco: Former Systems developer    Types: Chew    Quit date: 2007  . Tobacco comment: Dip smokeless tobacco >20-30 years  Vaping Use  . Vaping Use: Never used  Substance Use Topics  . Alcohol use: Yes    Comment: occ  . Drug use: No    Review of Systems Per HPI unless specifically indicated above     Objective:    There were no vitals taken for this visit.  Wt Readings from Last 3 Encounters:  02/09/20 158 lb (71.7 kg)  02/08/20 158 lb 3.2 oz (71.8 kg)  02/07/20 161 lb (73 kg)    Physical Exam   Note examination was completely remotely via video observation objective data only  Gen - well-appearing, no  acute distress or apparent pain, comfortable HEENT - eyes appear clear without discharge or redness - sinus congestion evident with his voice Heart/Lungs - cannot examine virtually - observed no evidence of coughing or labored breathing. Skin - face visible today- no rash Neuro - awake, alert, oriented Psych - not anxious appearing   Results for orders placed or performed in visit on 02/09/20  Hepatitis B surface antibody,qualitative  Result Value Ref Range   Hep B S Ab NON-REACTIVE NON-REACTI  Measles/Mumps/Rubella Immunity  Result Value Ref Range   Rubeola IgG 113.00 AU/mL   Mumps IgG 62.50 AU/mL    Rubella 6.59 Index      Assessment & Plan:   Problem List Items Addressed This Visit    None    Visit Diagnoses    Chronic frontal sinusitis    -  Primary   Relevant Medications   fluticasone (FLONASE) 50 MCG/ACT nasal spray   levofloxacin (LEVAQUIN) 500 MG tablet      Consistent with acute frontal sinusitis, likely some allergic rhinitis component with worsening concern for bacterial infection - only partial improvement on augmentin. 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 Start nasal steroid Flonase 2 sprays in each nostril daily for 4-6 weeks, may repeat course seasonally or as needed  3. Use nasal saline - still f/u with Pulm on O2 and Pulm rehab Return criteria reviewed  Future consider Singulair add on Future consider ENT - would eval in person if needed prior to referral if tonsillar or sinus issue is problem.  Meds ordered this encounter  Medications  . fluticasone (FLONASE) 50 MCG/ACT nasal spray    Sig: Place 2 sprays into both nostrils daily. Use for 4-6 weeks then stop and use seasonally or as needed.    Dispense:  16 g    Refill:  3  . levofloxacin (LEVAQUIN) 500 MG tablet    Sig: Take 1 tablet (500 mg total) by mouth daily. For 7 days    Dispense:  7 tablet    Refill:  0      Follow up plan: Return in about 1 week (around 03/01/2020), or if symptoms worsen or fail to improve, for sinusitis if not improved.  Return for Hep B Vaccine nurse visit when ready.  Nobie Putnam, [facility] 02/23/2020,  11:38 AM

## 2020-02-24 ENCOUNTER — Encounter: Payer: PPO | Admitting: *Deleted

## 2020-02-24 ENCOUNTER — Ambulatory Visit (INDEPENDENT_AMBULATORY_CARE_PROVIDER_SITE_OTHER): Payer: PPO | Admitting: Pulmonary Disease

## 2020-02-24 ENCOUNTER — Encounter: Payer: Self-pay | Admitting: Pulmonary Disease

## 2020-02-24 DIAGNOSIS — J432 Centrilobular emphysema: Secondary | ICD-10-CM

## 2020-02-24 DIAGNOSIS — M0579 Rheumatoid arthritis with rheumatoid factor of multiple sites without organ or systems involvement: Secondary | ICD-10-CM | POA: Diagnosis not present

## 2020-02-24 DIAGNOSIS — J849 Interstitial pulmonary disease, unspecified: Secondary | ICD-10-CM | POA: Diagnosis not present

## 2020-02-24 DIAGNOSIS — I4891 Unspecified atrial fibrillation: Secondary | ICD-10-CM

## 2020-02-24 NOTE — Assessment & Plan Note (Signed)
Plan: Continue follow-up with rheumatology at [facility] clinic Continue maintenance daily prednisone as outlined by rheumatology Continue Imuran

## 2020-02-24 NOTE — Assessment & Plan Note (Signed)
Recent diagnosis of A. fib with hospitalization in June/2021 Started on Eliquis at discharge  Plan: Patient needs to schedule appointment with either primary care or cardiology for chronic management of Eliquis Patient spouse to contact cardiology today

## 2020-02-24 NOTE — Patient Instructions (Signed)
You were seen today by Lauraine Rinne, NP  for:   1. Centrilobular emphysema (HCC)  Spiriva Respimat 2.5 >>> 2 puffs daily >>> Do this every day >>>This is not a rescue inhaler   2. ILD (interstitial lung disease) (HCC)  Continue Ofev  Continue oxygen therapy as prescribed  >>>maintain oxygen saturations greater than 88 percent  >>>if unable to maintain oxygen saturations please contact the office  >>>do not smoke with oxygen  >>>can use nasal saline gel or nasal saline rinses to moisturize nose if oxygen causes dryness  Continue follow-up with pulmonary rehab  We will bring you back for evaluation with our office in 4 weeks with Dr. Chase Caller  3. Rheumatoid arthritis involving multiple sites with positive rheumatoid factor ([place])  Continue follow-up with [facility] clinic rheumatology  When finished with prednisone taper transition back to daily dosing of prednisone as managed by rheumatology  Continue Imuran  4.  A. fib  Schedule an appointment with your current cardiologist in [facility] for further follow-up as well as refills with Eliquis.  Follow Up:    Return in about 4 weeks (around 03/23/2020), or if symptoms worsen or fail to improve, for Follow up with Dr. Purnell Shoemaker, ILD clinic - 95min slot.   Please do your part to reduce the spread of COVID-19:      Reduce your risk of any infection  and COVID19 by using the similar precautions used for avoiding the common cold or flu:  Marland Kitchen Wash your hands often with soap and warm water for at least 20 seconds.  If soap and water are not readily available, use an alcohol-based hand sanitizer with at least 60% alcohol.  . If coughing or sneezing, cover your mouth and nose by coughing or sneezing into the elbow areas of your shirt or coat, into a tissue or into your sleeve (not your hands). Langley Gauss A MASK when in public  . Avoid shaking hands with others and consider head nods or verbal greetings only. . Avoid touching your eyes,  nose, or mouth with unwashed hands.  . Avoid close contact with people who are sick. . Avoid places or events with large numbers of people in one location, like concerts or sporting events. . If you have some symptoms but not all symptoms, continue to monitor at home and seek medical attention if your symptoms worsen. . If you are having a medical emergency, call 911.   [place] / e-Visit: [email or web address]         [facility]: [facility]: [phone]                   [facility]: [phone]     It is flu season:   >>> Best ways to protect herself from the flu: Receive the yearly flu vaccine, practice good hand hygiene washing with soap and also using hand sanitizer when available, eat a nutritious meals, get adequate rest, hydrate appropriately   Please contact the office if your symptoms worsen or you have concerns that you are not improving.   Thank you for choosing [facility] for your healthcare, and for allowing [place] to partner with you on your healthcare journey. I am thankful to be able to provide care to you today.   Wyn Quaker FNP-C

## 2020-02-24 NOTE — Progress Notes (Signed)
Daily Session Note  Patient Details  Name: Lawrence Wells MRN: [id number] Date of Birth: November 08, 1955 Referring Provider:     Pulmonary Rehab from 10/28/2019 in [facility] Cardiac and Pulmonary Rehab  Referring Provider Brand Males MD      Encounter Date: 02/24/2020  Check In:  Session Check In - 02/24/20 0825      Check-In   Supervising physician immediately available to respond to emergencies See telemetry face sheet for immediately available ER MD    Location [facility]-Cardiac & Pulmonary Rehab    Staff Present Heath Lark, RN, BSN, CCRP;Joseph Foy [place], [place], ACSM CEP, Exercise Physiologist    Virtual Visit No    Medication changes reported     No    Fall or balance concerns reported    No    Warm-up and Cool-down Performed on first and last piece of equipment    Resistance Training Performed Yes    VAD Patient? No    PAD/SET Patient? No      Pain Assessment   Currently in Pain? No/denies              Social History   Tobacco Use  Smoking Status Former Smoker  . Packs/day: 1.50  . Years: 30.00  . Pack years: 45.00  . Types: Cigarettes  . Quit date: 2007  . Years since quitting: 14.5  Smokeless Tobacco Former Systems developer  . Types: Chew  . Quit date: 2007  Tobacco Comment   Dip smokeless tobacco >20-30 years    Goals Met:  Proper associated with RPD/PD & O2 Sat Independence with exercise equipment Exercise tolerated well No report of cardiac concerns or symptoms  Goals Unmet:  Not Applicable  Comments: Pt able to follow exercise prescription today without complaint.  Will continue to monitor for progression.    Dr. Emily Filbert is Medical Director for [facility] and [facility].

## 2020-02-24 NOTE — Assessment & Plan Note (Signed)
Plan: Continue Spiriva Respimat 2.5

## 2020-02-24 NOTE — Telephone Encounter (Signed)
Spoke to pt and offered f/u with MR at [facility] office, as MR will not be traveling to [place] office the month of August.  Pt was agreeable with this, however MR's schedule is not out for [place] as of yet. Pt is aware that I will contact him to schedule one MD's schedule is out.

## 2020-02-24 NOTE — Telephone Encounter (Signed)
Pt has been scheduled for 04/04/2020 at 10:30.  pt is aware of location of [place] office.  Nothing further is needed at this time.

## 2020-02-24 NOTE — Assessment & Plan Note (Signed)
Plan: Continue Ofev Finish prednisone taper Keep follow-up with [facility] pulmonary Patient have 4-week follow-up with Dr. Chase Caller in ILD clinic slot Continue oxygen therapy Continue to work with pulmonary rehab Notify our office with worsening clinical symptoms

## 2020-02-24 NOTE — Progress Notes (Signed)
Virtual Visit via Telephone Note  I connected with Lawrence Wells on 02/24/20 at 10:30 AM EDT by telephone and verified that I am speaking with the correct person using two identifiers.  Location: Patient: Home Provider: [facility] - [address], [place], [place], [place] [address]   I discussed the limitations, risks, security and privacy concerns of performing an evaluation and management service by telephone and the availability of in person appointments. I also discussed with the patient that there may be a patient responsible charge related to this service. The patient expressed understanding and agreed to proceed.  Patient consented to consult via telephone: Yes People present and their role in pt care: Pt     History of Present Illness:  64 year old male former smoker followed in our office for interstitial lung disease and centrilobular emphysema  Past medical history: Osteoarthritis, rheumatoid arthritis, history of Covid 19 infection Smoking history: Former smoker.  Quit 2007.  45-pack-year smoking history Maintenance: Spiriva Respimat 2.5 Patient of Dr. Chase Caller   Chief complaint: 1 week follow-up   64 year old male former smoker followed in our office for interstitial lung disease, history of COVID-19 infection and centrilobular emphysema.  He is followed by Dr. Chase Caller.  He last completed follow-up with our office on 02/08/2020 with a EW NP.  At that office visit it was recommended that he continue on his antifibrotic medication.  And continue 6-minute walks follow-up with [facility] transplant team.  June/2021 high-resolution CT chest appearance of the lungs is very similar.  Remain on Ofev.  6-week follow-up with Dr. Chase Caller in a 30-minute ILD clinic slot.  Walk in office in June/2021 showed that oxygen saturations remained 90% on 3 L after 2 laps.  Patient recently tested positive for Covid in May/2021.  He contacted our office on 02/18/2020 reporting that  he is having worsened shortness of breath and oxygen needs up to 6 L with physical exertion.  It was recommended that he take Augmentin as well as start a prednisone taper.  He also needed to alert the [facility] transplant team of his symptoms worsening.  It was recommended that he have follow-up with our office.  He is completing a telephonic follow-up today.  Patient reporting that he feels that his breathing has improved slightly.  He did complete follow-up with primary care yesterday and due to persistent nasal drainage and sinus tenderness patient was switched from Augmentin to Levaquin.  Primary care has prescribed this.  Patient is still taking prednisone he is a few days left of his taper.  They do believe that this is helped.  They have contacted and updated [facility] transplant team.  No new recommendations at this time.  Patient was able to complete pulmonary rehab today.  Patient is requiring 5 to 6 L with physical exertion and pulmonary rehab.  On telephonic visit today oxygen saturations are 91% at rest.  When I asked the patient to ambulate for a few minutes and then check his oxygen levels oxygen saturations were 90 to 94% on room air with light physical exertion.  As soon as the patient stopped walking and took a seat oxygen saturations dropped to 83% on room air.  Patient has been monitoring this with his wife and they are wearing oxygen when oxygen saturations dropped below 88%.  Patient and spouse are looking for guidance with his recent diagnosis of A. fib.  He was placed on a blood thinner Eliquis.  Given a 30-day supply from the hospitalization  in June/2021.  They are unsure who to follow-up with on chronic management of this.  They did not speak with primary care.  They do have cardiology follow-up in [place] as well as at [facility]  We will discuss this today.  Observations/Objective:  02/24/20 - SPO2 - 91 on RA at rest  02/24/20 - SPO2 - 90 - 94 on RA with light physical exertion, 83 percent on  RA after stopping walk  02/24/20 - HR - 82-96 during visit    Social History   Tobacco Use  Smoking Status Former Smoker  . Packs/day: 1.50  . Years: 30.00  . Pack years: 45.00  . Types: Cigarettes  . Quit date: 2007  . Years since quitting: 14.5  Smokeless Tobacco Former Systems developer  . Types: Chew  . Quit date: 2007  Tobacco Comment   Dip smokeless tobacco >20-30 years   Immunization History  Administered Date(s) Administered  . PFIZER SARS-COV-2 Vaccination 11/22/2019, 12/15/2019  . Pneumococcal Conjugate-13 09/08/2019  . Tdap 04/19/2015, 05/07/2017  . Zoster Recombinat (Shingrix) 09/29/2019, 11/27/2019      Assessment and Plan:  Centrilobular emphysema ([place]) Plan: Continue Spiriva Respimat 2.5  ILD (interstitial lung disease) ([place]) Plan: Continue Ofev Finish prednisone taper Keep follow-up with [facility] Patient have 4-week follow-up with Dr. Chase Caller in ILD clinic slot Continue oxygen therapy Continue to work with pulmonary rehab Notify our office with worsening clinical symptoms  Rheumatoid arthritis involving multiple sites with positive rheumatoid factor ([place]) Plan: Continue follow-up with rheumatology at [facility] clinic Continue maintenance daily prednisone as outlined by rheumatology Continue Imuran  Atrial fibrillation ([place]) Recent diagnosis of A. fib with hospitalization in June/2021 Started on Eliquis at discharge  Plan: Patient needs to schedule appointment with either primary care or cardiology for chronic management of Eliquis Patient spouse to contact cardiology today    Follow Up Instructions:  Return in about 4 weeks (around 03/23/2020), or if symptoms worsen or fail to improve, for Follow up with Dr. Purnell Shoemaker, ILD clinic - 35min slot.   I discussed the assessment and treatment plan with the patient. The patient was provided an opportunity to ask questions and all were answered. The patient agreed with the plan and demonstrated an  understanding of the instructions.   The patient was advised to call back or seek an in-person evaluation if the symptoms worsen or if the condition fails to improve as anticipated.  I provided 35 minutes of non-face-to-face time during this encounter.   Lauraine Rinne, NP

## 2020-02-25 ENCOUNTER — Ambulatory Visit: Payer: Self-pay

## 2020-02-25 ENCOUNTER — Ambulatory Visit (INDEPENDENT_AMBULATORY_CARE_PROVIDER_SITE_OTHER): Payer: PPO | Admitting: Family Medicine

## 2020-02-25 ENCOUNTER — Encounter: Payer: Self-pay | Admitting: Family Medicine

## 2020-02-25 ENCOUNTER — Other Ambulatory Visit: Payer: Self-pay | Admitting: Otolaryngology

## 2020-02-25 ENCOUNTER — Other Ambulatory Visit: Payer: Self-pay | Admitting: *Deleted

## 2020-02-25 ENCOUNTER — Other Ambulatory Visit: Payer: Self-pay

## 2020-02-25 ENCOUNTER — Other Ambulatory Visit
Admission: RE | Admit: 2020-02-25 | Discharge: 2020-02-25 | Disposition: A | Discharge status: Home / Self Care | Payer: PPO | Source: Ambulatory Visit | Attending: Gastroenterology | Admitting: Gastroenterology

## 2020-02-25 VITALS — BP 98/58 | HR 76 | Temp 97.7°F | Resp 16 | Ht 71.0 in | Wt 155.0 lb

## 2020-02-25 DIAGNOSIS — R1310 Dysphagia, unspecified: Secondary | ICD-10-CM

## 2020-02-25 DIAGNOSIS — D3705 Neoplasm of uncertain behavior of pharynx: Secondary | ICD-10-CM | POA: Diagnosis not present

## 2020-02-25 DIAGNOSIS — R221 Localized swelling, mass and lump, neck: Secondary | ICD-10-CM

## 2020-02-25 DIAGNOSIS — J84112 Idiopathic pulmonary fibrosis: Secondary | ICD-10-CM | POA: Diagnosis not present

## 2020-02-25 DIAGNOSIS — J358 Other chronic diseases of tonsils and adenoids: Secondary | ICD-10-CM | POA: Diagnosis not present

## 2020-02-25 MED ORDER — APIXABAN 5 MG PO TABS: 5.0000 mg | ORAL_TABLET | Freq: Two times a day (BID) | ORAL | 3 refills | Status: DC

## 2020-02-25 NOTE — Pre-Procedure Instructions (Signed)
Patient arrived for covid testing.  He tested positive 01/10/20 at [facility] and does not need to be retested at this time.  It has been less than a 90 day period.

## 2020-02-25 NOTE — Telephone Encounter (Signed)
Py. And wife report he has had "throat issues x 3 weeks." Still has swelling in the throat. Difficulty when swallowing foods. No breathing difficulty. No fever. Report he is on second round of antibiotics and are concerned. Apolonio Schneiders in the practice gave 2:40 appointment for today. Instructed if symptoms worsen, go to ED. Verbalizes understanding.  Reason for Disposition . SEVERE (e.g., excruciating) throat pain  Answer Assessment - Initial Assessment Questions 1. ONSET: "When did the throat start hurting?" (Hours or days ago)      3 weeks 2. SEVERITY: "How bad is the sore throat?" (Scale 1-10; mild, moderate or severe)   - MILD (1-3):  doesn't interfere with eating or normal activities   - MODERATE (4-7): interferes with eating some solids and normal activities   - SEVERE (8-10):  excruciating pain, interferes with most normal activities   - SEVERE DYSPHAGIA: can't swallow liquids, drooling     Mild 3. STREP EXPOSURE: "Has there been any exposure to strep within the past week?" If Yes, ask: "What type of contact occurred?"      No 4.  VIRAL SYMPTOMS: "Are there any symptoms of a cold, such as a runny nose, cough, hoarse voice or red eyes?"      No 5. FEVER: "Do you have a fever?" If Yes, ask: "What is your temperature, how was it measured, and when did it start?"     No 6. PUS ON THE TONSILS: "Is there pus on the tonsils in the back of your throat?"     No 7. OTHER SYMPTOMS: "Do you have any other symptoms?" (e.g., difficulty breathing, headache, rash)     Swallowing is difficult 8. PREGNANCY: "Is there any chance you are pregnant?" "When was your last menstrual period?"     n/a  Protocols used: SORE THROAT-A-AH

## 2020-02-25 NOTE — Patient Instructions (Addendum)
Thank you for coming to the office today.  Dr Pryor Ochoa will see you today.  He can discuss diagnosis, biopsy possibility  [facility] [address]  [place], [place] [address] Ph: [phone]  Carloyn Manner, MD  Please schedule a Follow-up Appointment to: Return if symptoms worsen or fail to improve.  If you have any other questions or concerns, please feel free to call the office or send a message through [place]. You may also schedule an earlier appointment if necessary.  Additionally, you may be receiving a survey about your experience at our office within a few days to 1 week by e-mail or mail. We value your feedback.  Nobie Putnam, DO [place]

## 2020-02-25 NOTE — Progress Notes (Signed)
Subjective:    Patient ID: Lawrence Wells, male    DOB: 08-27-1955, 64 y.o.   MRN: [id number]  Lawrence Wells is a 64 y.o. male presenting on 02/25/2020 for Dysphagia (hard food or bigger pills hard to swollow)  Patient presents for a same day appointment.   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 Interval update with some improvement then worsening again He has had persistent deeper sinus congestion and glandular tonsillar swelling. Recent difficulty with swallowing dysphagia - Still on taper Prednisone now daily dose per Pulm Has supplemental O2 drying nasal Now on nasal steroid UTD on COVID vaccine No sick contacts Admits cough, productive sinus drainage Admits reduced energy Denies fever chills, chest pain  Today here returns in person because persistent throat / neck swelling abnormality and still dysphagia difficulty swallowing.  Denies any fevers, nausea vomiting aspiration  Depression screen [facility] 2/9 12/08/2019 12/01/2019 10/28/2019  Decreased Interest 0 0 1  Down, Depressed, Hopeless 1 0 0  PHQ - 2 Score 1 0 1  Altered sleeping 1 1 2   Tired, decreased energy 1 1 1   Change in appetite 0 0 0  Feeling bad or failure about yourself  0 1 1  Trouble concentrating 0 0 0  Moving slowly or fidgety/restless 1 1 1   Suicidal thoughts 0 0 2  PHQ-9 Score 4 4 8   Difficult doing work/chores Not difficult at all - Not difficult at all    Social History   Tobacco Use  . Smoking status: Former Smoker    Packs/day: 1.50    Years: 30.00    Pack years: 45.00    Types: Cigarettes    Quit date: 2007     Years since quitting: 14.5  . Smokeless tobacco: Former Systems developer    Types: Chew    Quit date: 2007  . Tobacco comment: Dip smokeless tobacco >20-30 years  Vaping Use  . Vaping Use: Never used  Substance Use Topics  . Alcohol use: Yes    Comment: occ  . Drug use: No    Review of Systems Per HPI unless specifically indicated above     Objective:    BP (!) 98/58   Pulse 76   Temp 97.7 F (36.5 C) (Temporal)   Resp 16   Ht 5\' 11"  (1.803 m)   Wt 155 lb (70.3 kg)   SpO2 93%   BMI 21.62 kg/m   Wt Readings from Last 3 Encounters:  02/25/20 155 lb (70.3 kg)  02/09/20 158 lb (71.7 kg)  02/08/20 158 lb 3.2 oz (71.8 kg)    Physical Exam Vitals and nursing note reviewed.  Constitutional:      General: He is not in acute distress.    Appearance: He is well-developed. He is not diaphoretic.     Comments: Well-appearing, comfortable, cooperative  HENT:     Head: Normocephalic and atraumatic.     Mouth/Throat:     Pharynx: No oropharyngeal exudate or posterior oropharyngeal erythema.     Comments: Asymmetrical soft palate / tonsils with R side hypertrophy protrusion No erythema. No exudates. Uvula is not midline. Eyes:  General:        Right eye: No discharge.        Left eye: No discharge.     Conjunctiva/sclera: Conjunctivae normal.  Neck:     Thyroid: No thyromegaly.     Comments: R neck upper cervical submandibular larger mass-like swelling localized. Cardiovascular:     Rate and Rhythm: Normal rate and regular rhythm.     Heart sounds: Normal heart sounds. No murmur heard.   Pulmonary:     Effort: Pulmonary effort is normal. No respiratory distress.     Breath sounds: Normal breath sounds. No wheezing or rales.     Comments: Good air movement today Musculoskeletal:        General: Normal range of motion.     Cervical back: Normal range of motion and neck supple. No tenderness.  Skin:    General: Skin is warm and dry.     Findings: No erythema or rash.    Neurological:     Mental Status: He is alert and oriented to person, place, and time.  Psychiatric:        Behavior: Behavior normal.     Comments: Well groomed, good eye contact, normal speech and thoughts        Results for orders placed or performed in visit on 02/09/20  Hepatitis B surface antibody,qualitative  Result Value Ref Range   Hep B S Ab NON-REACTIVE NON-REACTI  Measles/Mumps/Rubella Immunity  Result Value Ref Range   R[place]ola IgG 113.00 AU/mL   Mumps IgG 62.50 AU/mL   Rubella 6.59 Index      Assessment & Plan:   Problem List Items Addressed This Visit    None    Visit Diagnoses    Dysphagia, unspecified type    -  Primary   Mass of right side of neck       Tonsillar mass         Now subacute concern with R sided tonsillar/soft palate swelling vs mass with evidence R upper neck larger mass-like swelling as well.  Not consistent with abscess or focal lesion.   Recent recurrent sinusitis, limited improvement despite antibiotic courses and prednisone He is immunosuppressed and with Chronic ILD on oxygen likely affecting his congestion and dry nasal with some bleeding as well On chronic prednisone, now 10mg  already finished a higher dose taper Recent visit without new acute change to his breathing resp status or lung exam, had CXR reviewed  Plan: - urgent referral to ENT by curbside consultation - called [facility] while patient in our office still, spoke directly with Dr Pryor Ochoa and reviewed case. Patient has been there to see him in years past for hearing loss. He agreed to see patient immediately with work in and would determine if CT imaging and or biopsy indicated. - Patient to go directly to their office. - Keep on current therapy otherwise, and f/u with Pulm as planned.  He has upcoming colonoscopy on Tuesday 7/13 he may proceed as scheduled, if determine that this issue would impact his airway or breathing, then ENT / anesthesiology may discuss  further options or postpone the procedure  Use nasal saline - still f/u with Pulm on O2 and Pulm rehab Return criteria reviewed   No orders of the defined types were placed in this encounter.     Follow up plan: Return if symptoms worsen or fail to improve.   Lawrence Wells, [facility] 02/25/2020, 3:18 PM

## 2020-02-28 ENCOUNTER — Other Ambulatory Visit: Payer: Self-pay

## 2020-02-28 ENCOUNTER — Ambulatory Visit
Admission: RE | Admit: 2020-02-28 | Discharge: 2020-02-28 | Disposition: A | Discharge status: Home / Self Care | Payer: PPO | Source: Ambulatory Visit | Attending: Otolaryngology | Admitting: Otolaryngology

## 2020-02-28 DIAGNOSIS — R1310 Dysphagia, unspecified: Secondary | ICD-10-CM | POA: Diagnosis not present

## 2020-02-28 DIAGNOSIS — J3489 Other specified disorders of nose and nasal sinuses: Secondary | ICD-10-CM | POA: Diagnosis not present

## 2020-02-28 DIAGNOSIS — J320 Chronic maxillary sinusitis: Secondary | ICD-10-CM | POA: Diagnosis not present

## 2020-02-28 DIAGNOSIS — J8410 Pulmonary fibrosis, unspecified: Secondary | ICD-10-CM | POA: Diagnosis not present

## 2020-02-28 DIAGNOSIS — J392 Other diseases of pharynx: Secondary | ICD-10-CM | POA: Diagnosis not present

## 2020-02-28 HISTORY — DX: Systemic involvement of connective tissue, unspecified: M35.9

## 2020-02-28 MED ORDER — IOHEXOL 300 MG/ML  SOLN
75.0000 mL | Freq: Once | INTRAMUSCULAR | Status: AC | PRN
  Administered 2020-02-28: 75 mL via INTRAVENOUS

## 2020-02-29 ENCOUNTER — Other Ambulatory Visit: Payer: Self-pay

## 2020-02-29 ENCOUNTER — Encounter
Admission: RE | Disposition: A | Discharge status: Home / Self Care | Payer: Self-pay | Source: Home / Self Care | Attending: Gastroenterology

## 2020-02-29 ENCOUNTER — Ambulatory Visit
Admission: RE | Admit: 2020-02-29 | Discharge: 2020-02-29 | Disposition: A | Discharge status: Home / Self Care | Payer: PPO | Attending: Gastroenterology | Admitting: Gastroenterology

## 2020-02-29 ENCOUNTER — Encounter: Payer: Self-pay | Admitting: Certified Registered"

## 2020-02-29 DIAGNOSIS — Z419 Encounter for procedure for purposes other than remedying health state, unspecified: Secondary | ICD-10-CM | POA: Diagnosis not present

## 2020-02-29 SURGERY — COLONOSCOPY WITH PROPOFOL
Anesthesia: General

## 2020-03-01 ENCOUNTER — Encounter: Payer: Self-pay | Admitting: *Deleted

## 2020-03-01 ENCOUNTER — Other Ambulatory Visit: Payer: Self-pay | Admitting: Otolaryngology

## 2020-03-01 DIAGNOSIS — J849 Interstitial pulmonary disease, unspecified: Secondary | ICD-10-CM

## 2020-03-01 DIAGNOSIS — C099 Malignant neoplasm of tonsil, unspecified: Secondary | ICD-10-CM | POA: Diagnosis not present

## 2020-03-01 DIAGNOSIS — J351 Hypertrophy of tonsils: Secondary | ICD-10-CM | POA: Diagnosis not present

## 2020-03-01 NOTE — Progress Notes (Signed)
Pulmonary Individual Treatment Plan  Patient Details  Name: DEMARRIUS GUERRERO MRN: [id number] Date of Birth: 10-19-1955 Referring Provider:     Pulmonary Rehab from 10/28/2019 in [facility] Cardiac and Pulmonary Rehab  Referring Provider Brand Males MD      Initial Encounter Date:    Pulmonary Rehab from 10/28/2019 in [facility] Cardiac and Pulmonary Rehab  Date 10/28/19      Visit Diagnosis: ILD (interstitial lung disease) ([place])  Patient's Home Medications on Admission:  Current Outpatient Medications:  .  albuterol (PROAIR HFA) 108 (90 Base) MCG/ACT inhaler, INHALE 2 PUFFS INTO THE LUNGS EVERY 4 HOURS AS NEEDED FOR WHEEZING OR SHORTNESS OF BREATH OR COUGH, Disp: , Rfl:  .  apixaban (ELIQUIS) 5 MG TABS tablet, Take 1 tablet (5 mg total) by mouth 2 (two) times daily., Disp: 60 tablet, Rfl: 3 .  azaTHIOprine (IMURAN) 50 MG tablet, Take 2 tablets (100 mg total) by mouth daily., Disp: 180 tablet, Rfl: 0 .  diclofenac Sodium (VOLTAREN) 1 % GEL, Apply 2 g topically 3 (three) times daily as needed. (Patient taking differently: Apply 2 g topically 3 (three) times daily as needed (Pain (Arthritis)). ), Disp: 100 g, Rfl: 2 .  ezetimibe (ZETIA) 10 MG tablet, Take 1 tablet (10 mg total) by mouth daily., Disp: 90 tablet, Rfl: 3 .  fluticasone (FLONASE) 50 MCG/ACT nasal spray, Place 2 sprays into both nostrils daily. Use for 4-6 weeks then stop and use seasonally or as needed., Disp: 16 g, Rfl: 3 .  levofloxacin (LEVAQUIN) 500 MG tablet, Take 1 tablet (500 mg total) by mouth daily. For 7 days, Disp: 7 tablet, Rfl: 0 .  Multiple Vitamin (MULTIVITAMIN) tablet, Take 1 tablet by mouth daily., Disp: , Rfl:  .  Nintedanib (OFEV) 150 MG CAPS, Please take 1 tablet oral daily for 1 week, then resume back to 1 tablet twice daily after  that, Disp: 60 capsule, Rfl: 11 .  OXYGEN, Inhale into the lungs as needed., Disp: , Rfl:  .  pantoprazole (PROTONIX) 40 MG tablet, Take 1 tablet (40 mg total) by mouth daily., Disp: 30  tablet, Rfl: 1 .  predniSONE (DELTASONE) 10 MG tablet, 4 x 3 days, 3 x 3 days, 2 x 3 days, 1 x 3 then resume 7.5 mg, Disp: 30 tablet, Rfl: 0 .  Tiotropium Bromide Monohydrate (SPIRIVA RESPIMAT) 2.5 MCG/ACT AERS, Inhale 2 puffs into the lungs daily., Disp: 1 g, Rfl: 5 .  valACYclovir (VALTREX) 500 MG tablet, Take 1 tablet (500 mg total) by mouth 2 (two) times daily as needed (herpes flare). For 3-7 days as needed for flare, Disp: 30 tablet, Rfl: 1  Past Medical History: Past Medical History:  Diagnosis Date  . Arthritis   . Collagen vascular disease (HCC)    Rhematoid Arthritis  . COPD (chronic obstructive pulmonary disease) ([place])   . Coronary artery disease   . Dyspnea   . Emphysema lung ([place])   . Hyperlipidemia   . Pulmonary filariasis     Tobacco Use: Social History   Tobacco Use  Smoking Status Former Smoker  . Packs/day: 1.50  . Years: 30.00  . Pack years: 45.00  . Types: Cigarettes  . Quit date: 2007  . Years since quitting: 14.5  Smokeless Tobacco Former Systems developer  . Types: Chew  . Quit date: 2007  Tobacco Comment   Dip smokeless tobacco >20-30 years    Labs: Recent Review Scientist, physiological    Labs for ITP Cardiac and Pulmonary Rehab Latest Ref  Rng & Units 05/07/2017 09/02/2019   Cholestrol <200 mg/dL 259(H) 210(H)   LDLCALC mg/dL (calc) 156(H) 134(H)   HDL > OR = 40 mg/dL 76 51   Trlycerides <150 mg/dL 138 139   Hemoglobin A1c <5.7 % of total Hgb 5.5 5.7(H)       Pulmonary Assessment Scores:  Pulmonary Assessment Scores    Row Name 10/27/19 1117         ADL UCSD   ADL Phase Entry     SOB Score total 41     Rest 0     Walk 3     Stairs 4     Bath 2     Dress 2     Shop 1       CAT Score   CAT Score 15            UCSD: Self-administered rating of dyspnea associated with activities of daily living (ADLs) 6-point scale (0 = "not at all" to 5 = "maximal or unable to do because of breathlessness")  Scoring Scores range from 0 to 120.  Minimally  important difference is 5 units  CAT: CAT can identify the health impairment of COPD patients and is better correlated with disease progression.  CAT has a scoring range of zero to 40. The CAT score is classified into four groups of low (less than 10), medium (10 - 20), high (21-30) and very high (31-40) based on the impact level of disease on health status. A CAT score over 10 suggests significant symptoms.  A worsening CAT score could be explained by an exacerbation, poor medication adherence, poor inhaler technique, or progression of COPD or comorbid conditions.  CAT MCID is 2 points  mMRC: mMRC (Modified Medical Research Council) Dyspnea Scale is used to assess the degree of baseline functional disability in patients of respiratory disease due to dyspnea. No minimal important difference is established. A decrease in score of 1 point or greater is considered a positive change.   Pulmonary Function Assessment:  Pulmonary Function Assessment - 10/27/19 1118      Breath   Shortness of Breath Yes;Limiting activity           Exercise Target Goals: Exercise Program Goal: Individual exercise prescription set using results from initial 6 min walk test and THRR while considering  patient's activity barriers and safety.   Exercise Prescription Goal: Initial exercise prescription builds to 30-45 minutes a day of aerobic activity, 2-3 days per week.  Home exercise guidelines will be given to patient during program as part of exercise prescription that the participant will acknowledge.  Education: Aerobic Exercise & Resistance Training: - Gives group verbal and written instruction on the various components of exercise. Focuses on aerobic and resistive training programs and the benefits of this training and how to safely progress through these programs..   Education: Exercise & Equipment Safety: - Individual verbal instruction and demonstration of equipment use and safety with use of the  equipment.   Pulmonary Rehab from 10/28/2019 in [facility] Cardiac and Pulmonary Rehab  Date 10/28/19  Educator [facility]  Instruction Review Code 1- Verbalizes Understanding      Education: Exercise Physiology & General Exercise Guidelines: - Group verbal and written instruction with models to review the exercise physiology of the cardiovascular system and associated critical values. Provides general exercise guidelines with specific guidelines to those with heart or lung disease.    Education: Flexibility, Balance, Mind/Body Relaxation: Provides group verbal/written instruction on the benefits of  flexibility and balance training, including mind/body exercise modes such as yoga, pilates and tai chi.  Demonstration and skill practice provided.   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 HR 95 bpm     Max Ex. BP 146/72     2 Minute Post BP 134/70       Interval HR   1 Minute HR 82     2 Minute HR 89     3 Minute HR 89     4 Minute HR 95     5 Minute HR 92     6 Minute HR 93     2 Minute Post HR 67     Interval Heart Rate? Yes       Interval Oxygen   Interval Oxygen? Yes     Baseline Oxygen Saturation % 95 %     1 Minute Oxygen  Saturation % 89 %     1 Minute Liters of Oxygen 0 L  Room Air     2 Minute Oxygen Saturation % 86 %     2 Minute Liters of Oxygen 0 L     3 Minute Oxygen Saturation % 82 %     3 Minute Liters of Oxygen 0 L     4 Minute Oxygen Saturation % 81 %     4 Minute Liters of Oxygen 0 L     5 Minute Oxygen Saturation % 80 %     5 Minute Liters of Oxygen 0 L     6 Minute Oxygen Saturation % 79 %     6 Minute Liters of Oxygen 0 L     2 Minute Post Oxygen Saturation % 91 %     2 Minute Post Liters of Oxygen 0 L           Oxygen Initial Assessment:  Oxygen Initial Assessment - 11/03/19 0835      Home Oxygen   Home Oxygen Device Home Concentrator;E-Tanks    Sleep Oxygen Prescription Continuous    Liters per minute 2    Home Exercise Oxygen Prescription Continuous    Liters per minute 2    Home at Rest Exercise Oxygen Prescription None    Liters per minute 0    Compliance with Home Oxygen Use Yes           Oxygen Re-Evaluation:  Oxygen Re-Evaluation  [place] Name 11/03/19 0843 11/25/19 0742 12/29/19 0758 02/09/20 0856       Program Oxygen Prescription   Program Oxygen Prescription E-Tanks;Continuous E-Tanks;Continuous E-Tanks;Continuous E-Tanks;Continuous    Liters per minute _0 Comments Required oxygen 2 L  while exercising Required oxygen 3 L  while exercising Required oxygen 3 L  while exercising 4Required 4 L while exercising today. Returned today post [place] Concentrator;E-Tanks    Sleep Oxygen Prescription Continuous Continuous Continuous Continuous    Liters per minute 2 2  may use 3L when worked hard during the day (a couple times a week) 2 2    Home Exercise Oxygen Prescription Continuous Continuous Continuous Continuous    Liters per minute _1 Home at Rest Exercise Oxygen Prescription None -- None Continuous    Liters per minute 0 0 0 2     Compliance with Home Oxygen Use Yes Yes Yes No  Liahm arrived without home O2 today      Goals/Expected Outcomes   Short Term Goals To learn and exhibit compliance with exercise, home and travel O2 prescription;To learn and understand importance of monitoring SPO2 with pulse oximeter and demonstrate accurate use of the pulse oximeter.;To learn and understand importance of maintaining oxygen saturations>88%;To learn and demonstrate proper pursed lip breathing techniques or other breathing techniques. To learn and exhibit compliance with exercise, home and travel O2 prescription;To learn and understand importance of monitoring SPO2 with pulse oximeter and demonstrate accurate use of the pulse oximeter.;To learn and understand importance of maintaining oxygen saturations>88%;To learn and demonstrate proper pursed lip breathing techniques or other breathing techniques. To learn and exhibit compliance with exercise, home and travel O2 prescription;To learn and understand importance of monitoring SPO2 with pulse oximeter and demonstrate accurate use of the pulse oximeter.;To learn and understand importance of maintaining oxygen saturations>88%;To learn and demonstrate proper pursed lip breathing techniques or other breathing techniques. To learn and understand importance of maintaining oxygen saturations>88%    Long  Term Goals Exhibits compliance with exercise, home and travel O2 prescription;Verbalizes importance of monitoring SPO2 with pulse oximeter and return demonstration;Maintenance of O2 saturations>88%;Exhibits proper breathing techniques, such as pursed lip breathing or other method taught during program session Exhibits compliance with exercise, home and travel O2 prescription;Verbalizes importance of monitoring SPO2 with pulse oximeter and return demonstration;Maintenance of O2 saturations>88%;Exhibits proper breathing techniques, such as pursed lip breathing or other method taught during program  session Exhibits compliance with exercise, home and travel O2 prescription;Verbalizes importance of monitoring SPO2 with pulse oximeter and return demonstration;Maintenance of O2 saturations>88%;Exhibits proper breathing techniques, such as pursed lip breathing or other method taught during program session Verbalizes importance of monitoring SPO2 with pulse oximeter and return demonstration;Maintenance of O2 saturations>88%    Comments Reviewed PLB technique with pt.  Talked about how it works and it's importance in maintaining their exercise saturations. Reviewed PLB technique with pt.  Talked about how it works and it's importance in maintaining their exercise saturations. Elihu checks 02 at home.  He will be evaluated at [facility] for transplant later this month [place] required increase in exercise oxygen today. reminded him to check sat while exercising and keep above 88%    Goals/Expected Outcomes Short: Become more profiecient at using PLB.   Long: Become independent at using PLB. Short: Become more profiecient at using PLB.  Long: Become independent at using PLB. Short:  continue to use PLB and monitor 02 at home Long:  maintian 02 at optimal levels Short:  continue to use PLB and monitor 02 at home Long:  maintian 02 at optimal levels           Oxygen Discharge (Final Oxygen Re-Evaluation):  Oxygen Re-Evaluation - 02/09/20 0856      Program Oxygen Prescription   Program Oxygen Prescription E-Tanks;Continuous    Liters per minute 3    Comments 4Required 4 L while exercising today. Returned today post [place] Concentrator;E-Tanks    Sleep Oxygen Prescription Continuous    Liters per minute 2    Home Exercise Oxygen Prescription Continuous    Liters per minute 4    Home at Rest Exercise Oxygen Prescription Continuous    Liters per minute 2    Compliance with Home Oxygen Use No   Kaiden arrived without home O2 today     Goals/Expected Outcomes   Short  Term Goals To learn and understand importance of maintaining oxygen saturations>88%    Long  Term Goals Verbalizes importance of monitoring SPO2 with pulse oximeter and return demonstration;Maintenance of O2 saturations>88%    Comments Jerrie required increase in exercise oxygen today. reminded him to check sat while exercising and keep above 88%    Goals/Expected Outcomes Short:  continue to use PLB and monitor 02 at home Long:  maintian 02 at optimal levels           Initial Exercise Prescription:  Initial Exercise Prescription - 10/28/19 1100      Date of Initial Exercise RX and Referring Provider   Date 10/28/19    Referring Provider Brand Males MD      Oxygen   Oxygen Continuous    Liters 2      Treadmill   MPH 2.4    Grade 0.5    Minutes 15    METs 3      NuStep   Level 3    SPM 80    Minutes 15    METs 3      Elliptical   Level 1    Speed 3    Minutes 15    METs 3      Biostep-RELP   Level 3    SPM 50    Minutes 15    METs 3      Prescription Details   Frequency (times per week) 2    Duration Progress to 30 minutes of continuous aerobic without signs/symptoms of physical distress      Intensity   THRR 40-80% of Max Heartrate 98-138    Ratings of Perceived Exertion 11-13    Perceived Dyspnea 0-4      Progression   Progression Continue to progress workloads to maintain intensity without signs/symptoms of physical distress.      Resistance Training   Training Prescription Yes    Weight 5 lb    Reps 10-15           Perform Capillary Blood Glucose checks as needed.  Exercise Prescription Changes:  Exercise Prescription Changes    Row Name 10/28/19 1100 11/10/19 0900 11/23/19 1500 12/08/19 1100 12/09/19 0800     Response to Exercise   Blood Pressure (Admit) 130/74 110/68 104/60 128/70 --   Blood Pressure (Exercise) 146/72 124/78 134/74 152/74 --   Blood Pressure (Exit) 132/70 104/60 106/60 126/74 --  Heart Rate (Admit) 60 bpm 69 bpm 70  bpm 69 bpm --   Heart Rate (Exercise) 95 bpm 90 bpm 97 bpm 98 bpm --   Heart Rate (Exit) 62 bpm 74 bpm 74 bpm 85 bpm --   Oxygen Saturation (Admit) 95 % 91 % 98 % 97 % --   Oxygen Saturation (Exercise) 79 % 92 % 82 % 89 % --   Oxygen Saturation (Exit) 95 % 94 % 98 % 97 % --   Rating of Perceived Exertion (Exercise) _0 --   Perceived Dyspnea (Exercise) _1 --   Symptoms none SOB SOB SOB --   Comments walk test results -- -- -- --   Duration -- Continue with 30 min of aerobic exercise without signs/symptoms of physical distress. Continue with 30 min of aerobic exercise without signs/symptoms of physical distress. Continue with 30 min of aerobic exercise without signs/symptoms of physical distress. --   Intensity -- THRR unchanged THRR unchanged THRR unchanged --     Progression   Progression -- Continue to progress workloads to maintain intensity without signs/symptoms of physical distress. Continue to progress workloads to maintain intensity without signs/symptoms of physical distress. Continue to progress workloads to maintain intensity without signs/symptoms of physical distress. --   Average METs -- 3 4  4L on TM  3.68 --     Resistance Training   Training Prescription -- Yes Yes Yes --   Weight -- 5 lb 5 lb 5 lb --   Reps -- 10-15 10-15 10-15 --     Interval Training   Interval Training -- No No No --     Oxygen   Oxygen -- Continuous Continuous Continuous --   Liters -- 2-3 3-4 3-4 --     Treadmill   MPH -- 2.4 2.4 2.8 --   Grade -- 0._2 --   Minutes -- _3 --   METs -- 3 3.17 3.53 --     NuStep   Level -- -- -- 4 --   Minutes -- -- -- 15 --   METs -- -- -- 3.5 --     REL-XR   Level -- _4 --   Minutes -- _5 --     Biostep-RELP   Level -- -- -- 3 --   Minutes -- -- -- 15 --   METs -- -- -- 4 --     Home Exercise Plan   Plans to continue exercise at -- -- -- -- [employer] (comment)  Neurosurgeon and walking   Frequency -- --  -- -- Add 2 additional days to program exercise sessions.   Initial Home Exercises Provided -- -- -- -- 12/09/19   Row Name 12/20/19 1400 02/16/20 1000 02/28/20 1300         Response to Exercise   Blood Pressure (Admit) 94/60 104/58 94/58     Blood Pressure (Exercise) 128/72 128/74 120/60     Blood Pressure (Exit) _6     Heart Rate (Admit) 71 bpm 103 bpm 65 bpm     Heart Rate (Exercise) 98 bpm 121 bpm 94 bpm     Heart Rate (Exit) 74 bpm 100 bpm 91 bpm     Oxygen Saturation (Admit) 97 % 90 % 98 %     Oxygen Saturation (Exercise) 83 % 83 % 89 %     Oxygen Saturation (Exit) 95 % 94 % 94 %  Rating of Perceived Exertion (Exercise) _0 Perceived Dyspnea (Exercise) -- -- 2     Symptoms -- -- SOB     Duration Continue with 30 min of aerobic exercise without signs/symptoms of physical distress. Continue with 30 min of aerobic exercise without signs/symptoms of physical distress. Continue with 30 min of aerobic exercise without signs/symptoms of physical distress.     Intensity THRR unchanged THRR unchanged THRR unchanged       Progression   Progression Continue to progress workloads to maintain intensity without signs/symptoms of physical distress. Continue to progress workloads to maintain intensity without signs/symptoms of physical distress. Continue to progress workloads to maintain intensity without signs/symptoms of physical distress.     Average METs 3.25 2.35 2.7       Resistance Training   Training Prescription Yes Yes Yes     Weight 5 lb 5 lb 5 lb     Reps 10-15 10-15 10-15       Interval Training   Interval Training No No No       Oxygen   Oxygen Continuous Continuous Continuous     Liters 3-4 3-6 3-6       Treadmill   MPH 2.8 1.7 1.7     Grade 1 0 0     Minutes _1 METs 3.53 2.3 2.3       Recumbant Bike   Level -- -- 1     Watts -- -- 22     Minutes -- -- 15     METs -- -- 2.91       NuStep   Level -- -- 4     Minutes -- -- 15      METs -- -- 2.5       REL-XR   Level 6 6 --     Watts 50 50 --     Minutes 15 15 --     METs -- 2.4 --       Biostep-RELP   Level -- -- 3     Minutes -- -- 15     METs -- -- 3       Home Exercise Plan   Plans to continue exercise at -- -- [employer] (comment)  [employer] and walking     Frequency -- -- Add 2 additional days to program exercise sessions.     Initial Home Exercises Provided -- -- 12/09/19            Exercise Comments:  Exercise Comments    Row Name 11/03/19 0829 11/03/19 0830 12/01/19 0843 12/08/19 0750 12/16/19 4287   Exercise Comments First full day of exercise!  Patient was oriented to gym and equipment including functions, settings, policies, and procedures.  Patient's individual exercise prescription and treatment plan were reviewed.  All starting workloads were established based on the results of the 6 minute walk test done at initial orientation visit.  The plan for exercise progression was also introduced and progression will be customized based on patient's performance and goals. Dropped to 80% on TM  Placed  2l per nasal canuula oxygen on pJohn   Sats back up above 90% Today while on the TM, sats dropped to 85%. Cued PLB and decreased Workload to get sat to 88% O2 Sat  Dropping to 85% today   Oxygen increased to 4 L Adjusting oyygen while on TM for sat lower than 88%. Change did bring sats  up to 93%   Row Name 02/09/20 0856           Exercise Comments Carlin required O2 increase with exercise. Up to 4L from 2 L today O2 sats were as low as 76%              Exercise Goals and Review:  Exercise Goals    Row Name 10/28/19 1144             Exercise Goals   Increase Physical Activity Yes       Intervention Provide advice, education, support and counseling about physical activity/exercise needs.;Develop an individualized exercise prescription for aerobic and resistive training based on initial evaluation findings, risk stratification,  comorbidities and participant's personal goals.       Expected Outcomes Short Term: Attend rehab on a regular basis to increase amount of physical activity.;Long Term: Add in home exercise to make exercise part of routine and to increase amount of physical activity.;Long Term: Exercising regularly at least 3-5 days a week.       Increase Strength and Stamina Yes       Intervention Provide advice, education, support and counseling about physical activity/exercise needs.;Develop an individualized exercise prescription for aerobic and resistive training based on initial evaluation findings, risk stratification, comorbidities and participant's personal goals.       Expected Outcomes Short Term: Increase workloads from initial exercise prescription for resistance, speed, and METs.;Short Term: Perform resistance training exercises routinely during rehab and add in resistance training at home;Long Term: Improve cardiorespiratory fitness, muscular endurance and strength as measured by increased METs and functional capacity (6MWT)       Able to understand and use rate of perceived exertion (RPE) scale Yes       Intervention Provide education and explanation on how to use RPE scale       Expected Outcomes Short Term: Able to use RPE daily in rehab to express subjective intensity level;Long Term:  Able to use RPE to guide intensity level when exercising independently       Able to understand and use Dyspnea scale Yes       Intervention Provide education and explanation on how to use Dyspnea scale       Expected Outcomes Short Term: Able to use Dyspnea scale daily in rehab to express subjective sense of shortness of breath during exertion;Long Term: Able to use Dyspnea scale to guide intensity level when exercising independently       Knowledge and understanding of Target Heart Rate Range (THRR) Yes       Intervention Provide education and explanation of THRR including how the numbers were predicted and where they  are located for reference       Expected Outcomes Short Term: Able to state/look up THRR;Short Term: Able to use daily as guideline for intensity in rehab;Long Term: Able to use THRR to govern intensity when exercising independently       Able to check pulse independently Yes       Intervention Provide education and demonstration on how to check pulse in carotid and radial arteries.;Review the importance of being able to check your own pulse for safety during independent exercise       Expected Outcomes Short Term: Able to explain why pulse checking is important during independent exercise;Long Term: Able to check pulse independently and accurately       Understanding of Exercise Prescription Yes       Intervention Provide education, explanation, and written  materials on patient's individual exercise prescription       Expected Outcomes Long Term: Able to explain home exercise prescription to exercise independently;Short Term: Able to explain program exercise prescription              Exercise Goals Re-Evaluation :  Exercise Goals Re-Evaluation    Row Name 11/03/19 0834 11/10/19 0909 11/23/19 1535 11/25/19 0748 12/08/19 1006     Exercise Goal Re-Evaluation   Exercise Goals Review Knowledge and understanding of Target Heart Rate Range (THRR);Able to understand and use Dyspnea scale;Able to understand and use rate of perceived exertion (RPE) scale;Understanding of Exercise Prescription Increase Physical Activity;Increase Strength and Stamina;Understanding of Exercise Prescription Increase Physical Activity;Increase Strength and Stamina;Able to understand and use rate of perceived exertion (RPE) scale;Able to understand and use Dyspnea scale;Knowledge and understanding of Target Heart Rate Range (THRR);Able to check pulse independently;Understanding of Exercise Prescription Increase Physical Activity;Increase Strength and Stamina;Able to understand and use rate of perceived exertion (RPE) scale;Able  to understand and use Dyspnea scale;Knowledge and understanding of Target Heart Rate Range (THRR);Able to check pulse independently;Understanding of Exercise Prescription Increase Physical Activity;Increase Strength and Stamina;Understanding of Exercise Prescription   Comments Reviewed RPE scale, THR and program prescription with pt today.  Pt voiced understanding and was given a copy of goals to take home. Luis is off to a good start in rehab.  He is exercising on 2-3L to maintain his oxygen saturations. He has completed his first three full days of exercise.  We will continue to monitor his progress. Tabor had to use 4L on TM due to low O2.  He did increase incline to 1%.  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 Pt will rest when he gets SOB at work because does not wear oxygen. Ayaansh has been doing well in rehab.  He is now up to level 6 on the XR and his sats are doing well too.  We will continue to monitor his progression.   Expected Outcomes Short: Use RPE daily to regulate intensity. Long: Follow program prescription in THR. Short: Continue to attend rehab regularly  Long: Continue to improve stamina and maintain saturations. Short:  exercise consistently and keep O2 above 88%  Long: improve MET level Short:  exercise consistently and keep O2 above 88%  Long: improve MET level Short: Continue to monitor O2 closely  Long: Continue to improve stamina.   Tigard Name 12/09/19 3295 12/20/19 1405 02/02/20 1435 02/16/20 0741 02/28/20 1313     Exercise Goal Re-Evaluation   Exercise Goals Review Increase Physical Activity;Understanding of Exercise Prescription;Able to understand and use rate of perceived exertion (RPE) scale;Increase Strength and Stamina;Able to understand and use Dyspnea scale;Knowledge and understanding of Target Heart Rate Range (THRR);Able to check pulse independently Increase Physical  Activity;Increase Strength and Stamina;Able to understand and use rate of perceived exertion (RPE) scale;Able to understand and use Dyspnea scale;Knowledge and understanding of Target Heart Rate Range (THRR);Able to check pulse independently;Understanding of Exercise Prescription -- Increase Physical Activity;Increase Strength and Stamina Increase Physical Activity;Increase Strength and Stamina;Understanding of Exercise Prescription   Comments Reviewed home exercise with pt today.  Pt plans to walk and go to [employer] for exercise.  Reviewed THR, pulse, RPE, sign and symptoms, NTG use, and when to call 911 or MD.  Also discussed weather considerations and indoor options.  Pt voiced understanding. Subhan is  doing well in rehab.  He does have to use 4L on some pieces of equipment to keep sats above 88%. Out since last review Watt has been out with COVID and has had doctors appointments. He is going to continue to exercise and get back to increasing his stamina. Iasiah is doing well in rehab.  He has bounced right back after being out and is glad to get back to exercising again.  He was able to pick back up with most of his workloads.  We will continue to monitor his progression.   Expected Outcomes Short: Start to add in more exercise at home.  Long; Continue to improve stamina. Short:  monitor oxygen while exercising Long: continue to build stamina -- Short: attend [facility] regularly for exercise. Long: maintain a home routine independently Short: Continue to regain stamina, wear oxygen into class always  Long: Continue to exercise more at home.          Discharge Exercise Prescription (Final Exercise Prescription Changes):  Exercise Prescription Changes - 02/28/20 1300      Response to Exercise   Blood Pressure (Admit) 94/58    Blood Pressure (Exercise) 120/60    Blood Pressure (Exit) 90/56    Heart Rate (Admit) 65 bpm    Heart Rate (Exercise) 94 bpm    Heart Rate (Exit) 91 bpm    Oxygen  Saturation (Admit) 98 %    Oxygen Saturation (Exercise) 89 %    Oxygen Saturation (Exit) 94 %    Rating of Perceived Exertion (Exercise) 13    Perceived Dyspnea (Exercise) 2    Symptoms SOB    Duration Continue with 30 min of aerobic exercise without signs/symptoms of physical distress.    Intensity THRR unchanged      Progression   Progression Continue to progress workloads to maintain intensity without signs/symptoms of physical distress.    Average METs 2.7      Resistance Training   Training Prescription Yes    Weight 5 lb    Reps 10-15      Interval Training   Interval Training No      Oxygen   Oxygen Continuous    Liters 3-6      Treadmill   MPH 1.7    Grade 0    Minutes 15    METs 2.3      Recumbant Bike   Level 1    Watts 22    Minutes 15    METs 2.91      NuStep   Level 4    Minutes 15    METs 2.5      Biostep-RELP   Level 3    Minutes 15    METs 3      Home Exercise Plan   Plans to continue exercise at [employer] (comment)   [employer] and walking   Frequency Add 2 additional days to program exercise sessions.    Initial Home Exercises Provided 12/09/19           Nutrition:  Target Goals: Understanding of nutrition guidelines, daily intake of sodium <1568m, cholesterol <2018m calories 30% from fat and 7% or less from saturated fats, daily to have 5 or more servings of fruits and vegetables.  Education: Controlling Sodium/Reading Food Labels -Group verbal and written material supporting the discussion of sodium use in heart healthy nutrition. Review and explanation with models, verbal and written materials for utilization of the food label.   Education: General Nutrition Guidelines/Fats and  Fiber: -Group instruction provided by verbal, written material, models and posters to present the general guidelines for heart healthy nutrition. Gives an explanation and review of dietary fats and fiber.   Biometrics:  Pre Biometrics -  10/28/19 1145      Pre Biometrics   Height 5' 10.75" (1.797 m)    Weight 168 lb 14.4 oz (76.6 kg)    BMI (Calculated) 23.73    Single Leg Stand 30 seconds            Nutrition Therapy Plan and Nutrition Goals:  Nutrition Therapy & Goals - 12/09/19 1302      Nutrition Therapy   Diet Low Na, HH diet    Protein (specify units) 75-80g    Fiber 30 grams    Whole Grain Foods 3 servings    Saturated Fats 12 max. grams    Fruits and Vegetables 5 servings/day    Sodium 1.5 grams      Personal Nutrition Goals   Nutrition Goal ST: add protein shake either two 10g or one 20 g; optimize by having after workoutLT: Healthy as long as possible    Comments Shredded mini wheat, cheerios. Ham or bolonga sandwiches with fruit. Pretzles or chips. Meat with two vegetables. Airfryer. Eats fish. Discussed Pulmonary MNT and general HH eating.      Intervention Plan   Intervention Prescribe, educate and counsel regarding individualized specific dietary modifications aiming towards targeted core components such as weight, hypertension, lipid management, diabetes, heart failure and other comorbidities.;Nutrition handout(s) given to patient.    Expected Outcomes Short Term Goal: Understand basic principles of dietary content, such as calories, fat, sodium, cholesterol and nutrients.;Short Term Goal: A plan has been developed with personal nutrition goals set during dietitian appointment.;Long Term Goal: Adherence to prescribed nutrition plan.           Nutrition Assessments:  Nutrition Assessments - 10/27/19 1118      MEDFICTS Scores   Pre Score 23           MEDIFICTS Score Key:          ?70 Need to make dietary changes          40-70 Heart Healthy Diet         ? 40 Therapeutic Level Cholesterol Diet  Nutrition Goals Re-Evaluation:  Nutrition Goals Re-Evaluation    Ong Name 02/16/20 0747             Goals   Current Weight 166 lb (75.3 kg)       Nutrition Goal Gain some weight        Comment Gavynn has been drinking boost and other protien drinks to try to gain weight. He is drinking about three protien drinks a day on top of eating meals.       Expected Outcome Short: gain more weight. Long: be at a healthy weight for his Lung issues.              Nutrition Goals Discharge (Final Nutrition Goals Re-Evaluation):  Nutrition Goals Re-Evaluation - 02/16/20 0747      Goals   Current Weight 166 lb (75.3 kg)    Nutrition Goal Gain some weight    Comment Curren has been drinking boost and other protien drinks to try to gain weight. He is drinking about three protien drinks a day on top of eating meals.    Expected Outcome Short: gain more weight. Long: be at a healthy weight for his Lung issues.  Psychosocial: Target Goals: Acknowledge presence or absence of significant depression and/or stress, maximize coping skills, provide positive support system. Participant is able to verbalize types and ability to use techniques and skills needed for reducing stress and depression.   Education: Depression - Provides group verbal and written instruction on the correlation between heart/lung disease and depressed mood, treatment options, and the stigmas associated with seeking treatment.   Education: Sleep Hygiene -Provides group verbal and written instruction about how sleep can affect your health.  Define sleep hygiene, discuss sleep cycles and impact of sleep habits. Review good sleep hygiene tips.    Education: Stress and Anxiety: - Provides group verbal and written instruction about the health risks of elevated stress and causes of high stress.  Discuss the correlation between heart/lung disease and anxiety and treatment options. Review healthy ways to manage with stress and anxiety.   Initial Review & Psychosocial Screening:  Initial Psych Review & Screening - 10/27/19 0846      Initial Review   Current issues with Current Stress Concerns    Source of Stress  Concerns Chronic Illness;Unable to participate in former interests or hobbies;Unable to perform yard/household activities    Comments Pulmonary Fibrosis and Arthritis keep him from doing everything that he wants      Rison? Yes   both have several children, grandkids, neighbors     Barriers   Psychosocial barriers to participate in program The patient should benefit from training in stress management and relaxation.;Psychosocial barriers identified (see note)      Screening Interventions   Interventions Encouraged to exercise;To provide support and resources with identified psychosocial needs;Provide feedback about the scores to participant    Expected Outcomes Short Term goal: Utilizing psychosocial counselor, staff and physician to assist with identification of specific Stressors or current issues interfering with healing process. Setting desired goal for each stressor or current issue identified.;Long Term Goal: Stressors or current issues are controlled or eliminated.;Short Term goal: Identification and review with participant of any Quality of Life or Depression concerns found by scoring the questionnaire.;Long Term goal: The participant improves quality of Life and PHQ9 Scores as seen by post scores and/or verbalization of changes           Quality of Life Scores:  Scores of 19 and below usually indicate a poorer quality of life in these areas.  A difference of  2-3 points is a clinically meaningful difference.  A difference of 2-3 points in the total score of the Quality of Life Index has been associated with significant improvement in overall quality of life, self-image, physical symptoms, and general health in studies assessing change in quality of life.  PHQ-9: Recent Review Flowsheet Data    Depression screen [facility] 2/9 12/08/2019 12/01/2019 10/28/2019 09/08/2019 07/19/2019   Decreased Interest 0 0 1 0 0   Down, Depressed, Hopeless 1 0 0 0 0   PHQ - 2  Score 1 0 1 0 0   Altered sleeping _0 - -   Tired, decreased energy _1 - -   Change in appetite 0 0 0 - -   Feeling bad or failure about yourself  0 1 1 - -   Trouble concentrating 0 0 0 - -   Moving slowly or fidgety/restless _2 - -   Suicidal thoughts 0 0 2  - -   PHQ-9 Score _3 - -   Difficult doing  work/chores Not difficult at all - Not difficult at all - -     Interpretation of Total Score  Total Score Depression Severity:  1-4 = Minimal depression, 5-9 = Mild depression, 10-14 = Moderate depression, 15-19 = Moderately severe depression, 20-27 = Severe depression   Psychosocial Evaluation and Intervention:  Psychosocial Evaluation - 10/27/19 0855      Psychosocial Evaluation & Interventions   Interventions Stress management education;Encouraged to exercise with the program and follow exercise prescription    Comments Tomasz 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  He has always been an avid exerciser at [employer] prior to the pandemic last year.  He has rhuematoid arthritis all over which also limits his ability to do things and keeps him in chronic pain.  He has a strong support system in his wife.  He is coming to the program after his doctor recommended that he should be supervised as he gets back into his exercise routine.  He wants to get back to exercise and feel better again.  He wants to be able to breathe and able to do more.    Expected Outcomes Short: Attend rehab to get back into exercise routine.  Long: Continue to improve breathing.    Continue Psychosocial Services  Follow up required by staff           Psychosocial Re-Evaluation:  Psychosocial Re-Evaluation    Lucama Name 11/25/19 0744 12/29/19 0756 02/16/20 0743         Psychosocial Re-Evaluation   Current issues with Current Sleep Concerns Current Sleep Concerns;Current Stress Concerns Current Stress Concerns;Current Sleep Concerns     Comments Pt reports normal sleep is  tossing and turning - sometimes will have coughing spells or will get too hot. Pt reports getting enough sleep at night; 8pm-6am. Greyden sleeps well most of the time.  No major stress concerns. Edker understands he has severe lung issues and copes with it very well. He states that his sleep has been better recently. His wife is supportive and gets on him about exercising.     Expected Outcomes Pt will continue to manage stress Short: continue to work on sleep patterns Long:  maintain positive outlook Short: keep exercising to keep stress at a minimum. Long: maintain a healthy mental state post [facility].     Interventions Encouraged to attend Pulmonary Rehabilitation for the exercise -- Encouraged to attend Pulmonary Rehabilitation for the exercise     Continue Psychosocial Services  Follow up required by staff -- Follow up required by staff            Psychosocial Discharge (Final Psychosocial Re-Evaluation):  Psychosocial Re-Evaluation - 02/16/20 0743      Psychosocial Re-Evaluation   Current issues with Current Stress Concerns;Current Sleep Concerns    Comments Varick understands he has severe lung issues and copes with it very well. He states that his sleep has been better recently. His wife is supportive and gets on him about exercising.    Expected Outcomes Short: keep exercising to keep stress at a minimum. Long: maintain a healthy mental state post [facility].    Interventions Encouraged to attend Pulmonary Rehabilitation for the exercise    Continue Psychosocial Services  Follow up required by staff           Education: Education Goals: Education classes will be provided on a weekly basis, covering required topics. Participant will state understanding/return demonstration of topics presented.  Learning Barriers/Preferences:  Learning Barriers/Preferences -  10/27/19 7829      Learning Barriers/Preferences   Learning Barriers None    Learning Preferences None           General  Pulmonary Education Topics:  Infection Prevention: - Provides verbal and written material to individual with discussion of infection control including proper hand washing and proper equipment cleaning during exercise session.   Pulmonary Rehab from 10/28/2019 in [facility] Card[facility] Pulmonary Re[facility]  Date 10/28/19  Educator [facility]  Instruction Review Code 1- Verbalizes Understanding      Falls Prevention: - Provides verbal and written material to individual with discussion of falls prevention and safety.   Pulmonary Rehab from 10/28/2019 in [facility] Pulmonary Rehab  Date 10/28/19  Educator [facility]  Instruction Review Code 1- Verbalizes Understanding      Chronic Lung Diseases: - Group verbal and written instruction to review updates, respiratory medications, advancements in procedures and treatments. Discuss use of supplemental oxygen including available portable oxygen systems, continuous and intermittent flow rates, concentrators, personal use and safety guidelines. Review proper use of inhaler and spacers. Provide informative websites for self-education.    Energy Conservation: - Provide group verbal and written instruction for methods to conserve energy, plan and organize activities. Instruct on pacing techniques, use of adaptive equipment and posture/positioning to relieve shortness of breath.   Triggers and Exacerbations: - Group verbal and written instruction to review types of environmental triggers and ways to prevent exacerbations. Discuss weather changes, air quality and the benefits of nasal washing. Review warning signs and symptoms to help prevent infections. Discuss techniques for effective airway clearance, coughing, and vibrations.   AED/CPR: - Group verbal and written instruction with the use of models to demonstrate the basic use of the AED with the basic ABC's of resuscitation.   Anatomy and Physiology of the Lungs: - Group verbal and written instruction with the use  of models to provide basic lung anatomy and physiology related to function, structure and complications of lung disease.   Anatomy & Physiology of the Heart: - Group verbal and written instruction and models provide basic cardiac anatomy and physiology, with the coronary electrical and arterial systems. Review of Valvular disease and Heart Failure   Cardiac Medications: - Group verbal and written instruction to review commonly prescribed medications for heart disease. Reviews the medication, class of the drug, and side effects.   Other: -Provides group and verbal instruction on various topics (see comments)   Knowledge Questionnaire Score:  Knowledge Questionnaire Score - 10/27/19 1118      Knowledge Questionnaire Score   Pre Score 15/18 Education Focus: O2 Safety and Exercise            Core Components/Risk Factors/Patient Goals at Admission:  Personal Goals and Risk Factors at Admission - 10/28/19 1145      Core Components/Risk Factors/Patient Goals on Admission    Weight Management Yes;Weight Maintenance    Intervention Weight Management: Develop a combined nutrition and exercise program designed to reach desired caloric intake, while maintaining appropriate intake of nutrient and fiber, sodium and fats, and appropriate energy expenditure required for the weight goal.;Weight Management: Provide education and appropriate resources to help participant work on and attain dietary goals.    Admit Weight 168 lb 14.4 oz (76.6 kg)    Goal Weight: Short Term 165 lb (74.8 kg)    Goal Weight: Long Term 165 lb (74.8 kg)    Expected Outcomes Short Term: Continue to assess and modify interventions until short term weight  is achieved;Long Term: Adherence to nutrition and physical activity/exercise program aimed toward attainment of established weight goal;Weight Maintenance: Understanding of the daily nutrition guidelines, which includes 25-35% calories from fat, 7% or less cal from saturated  fats, less than 276m cholesterol, less than 1.5gm of sodium, & 5 or more servings of fruits and vegetables daily    Improve shortness of breath with ADL's Yes    Intervention Provide education, individualized exercise plan and daily activity instruction to help decrease symptoms of SOB with activities of daily living.    Expected Outcomes Short Term: Improve cardiorespiratory fitness to achieve a reduction of symptoms when performing ADLs;Long Term: Be able to perform more ADLs without symptoms or delay the onset of symptoms    Lipids Yes    Intervention Provide education and support for participant on nutrition & aerobic/resistive exercise along with prescribed medications to achieve LDL <754m HDL >4097m   Expected Outcomes Short Term: Participant states understanding of desired cholesterol values and is compliant with medications prescribed. Participant is following exercise prescription and nutrition guidelines.;Long Term: Cholesterol controlled with medications as prescribed, with individualized exercise RX and with personalized nutrition plan. Value goals: LDL < 70m37mDL > 40 mg.           Education:Diabetes - Individual verbal and written instruction to review signs/symptoms of diabetes, desired ranges of glucose level fasting, after meals and with exercise. Acknowledge that pre and post exercise glucose checks will be done for 3 sessions at entry of program.   Education: Know Your Numbers and Risk Factors: -Group verbal and written instruction about important numbers in your health.  Discussion of what are risk factors and how they play a role in the disease process.  Review of Cholesterol, Blood Pressure, Diabetes, and BMI and the role they play in your overall health.   Core Components/Risk Factors/Patient Goals Review:   Goals and Risk Factor Review    Row Name 11/25/19 0747 12/29/19 0754           Core Components/Risk Factors/Patient Goals Review   Personal Goals Review  Develop more efficient breathing techniques such as purse lipped breathing and diaphragmatic breathing and practicing self-pacing with activity. Develop more efficient breathing techniques such as purse lipped breathing and diaphragmatic breathing and practicing self-pacing with activity.;Improve shortness of breath with ADL's      Review Pt reports not having any significant health concerns. Kou uses oxygen as needed at home.  He can tell he has improved since he has been exercising.  He uses PLB.  He has arthritis and it takes him a little longer to get going in the morning      Expected Outcomes Continue to manage health and practice PLB. Short:  use PLB to help with ADLs Long:  manage health concerns             Core Components/Risk Factors/Patient Goals at Discharge (Final Review):   Goals and Risk Factor Review - 12/29/19 0754      Core Components/Risk Factors/Patient Goals Review   Personal Goals Review Develop more efficient breathing techniques such as purse lipped breathing and diaphragmatic breathing and practicing self-pacing with activity.;Improve shortness of breath with ADL's    Review Valente uses oxygen as needed at home.  He can tell he has improved since he has been exercising.  He uses PLB.  He has arthritis and it takes him a little longer to get going in the morning    Expected Outcomes Short:  use  PLB to help with ADLs Long:  manage health concerns           ITP Comments:  ITP Comments    Row Name 10/27/19 0912 10/28/19 1107 11/03/19 0828 11/10/19 0552 12/01/19 0845   ITP Comments Completed virtual orientation today.  EP evaluation is scheduled for Thursday 3/11 at 930am.  Documentation for diagnosis can be found in [facility] encounter 10/22/2019. Completed 6MWT and gym orientation.  Initial ITP created and sent for review to Dr. Emily Filbert, Medical Director. First full day of exercise!  Patient was oriented to gym and equipment including functions, settings, policies, and  procedures.  Patient's individual exercise prescription and treatment plan were reviewed.  All starting workloads were established based on the results of the 6 minute walk test done at initial orientation visit.  The plan for exercise progression was also introduced and progression will be customized based on patient's performance and goals. 30 day chart review completed. ITP sent to Dr Zachery Dakins Medical Director, for review,changes as needed and signature. Continue with ITP if no changes requested Today while on the TM, sats dropped to 85%. Cued PLB and decreased Workload to get sat to 88%   [place] Name 12/08/19 0530 12/08/19 0750 12/09/19 1321 12/16/19 0831 01/05/20 0534   ITP Comments 30 Day review completed. Medical Director review done, changes made as directed,and approval shown by signature of Market researcher. O2 Sat  Dropping to 85% today   Oxygen increased to 4 L Completed Initial RD Eval Adjusting oyygen while on TM for sat lower than 88%. Change did bring sats up to 93% 30 Day review completed. ITP review done, changes made as directed,and approval shown by signature of  Scientist, research (life sciences).   [place] Name 01/18/20 1528 01/24/20 1223 02/02/20 0518 02/02/20 1436 02/03/20 1553   ITP Comments Patrick was out last week getting evaluated for lung transplant at [facility] Called to check on pt. He was discharged yesterday from hospital.  Pt will ned to stay out until 6/21, should return on 6/23. 30 Day review completed. Medical Director ITP review done, changes made as directed,and signed approval by Medical Director. Out with COVID and transplant workup Sabrina hopes to return on Wednesday next week.   [place] Name 02/09/20 7331 02/09/20 0856 02/16/20 0749 03/01/20 0612     ITP Comments [name] returns todayafter COVID diagnisis. Rickie required O2 increase with exercise. Up to 4L from 2 L today O2 sats were as low as 76% Spoke to Pinch about wearing oxygen when he comes to rehab. He did not have oxygen with him and  Spo2 was 75 percent. Spoke to him why it is important to keep his oxygen above 88 percent on exertion. 30 Day review completed. Medical Director ITP review done, changes made as directed, and signed approval by Medical Director.           Comments:

## 2020-03-02 ENCOUNTER — Encounter: Payer: PPO | Admitting: *Deleted

## 2020-03-02 ENCOUNTER — Other Ambulatory Visit: Payer: Self-pay

## 2020-03-02 ENCOUNTER — Telehealth: Payer: Self-pay | Admitting: Internal Medicine

## 2020-03-02 DIAGNOSIS — J849 Interstitial pulmonary disease, unspecified: Secondary | ICD-10-CM

## 2020-03-02 NOTE — Progress Notes (Signed)
Daily Session Note  Patient Details  Name: MACKSON BOTZ MRN: [id number] Date of Birth: [birth year] Referring Provider:     Pulmonary Rehab from 10/28/2019 in [facility]  Referring Provider Brand Males MD      Encounter Date: 03/02/2020  Check In:  Session Check In - 03/02/20 0720      Check-In   Supervising physician immediately available to respond to emergencies See telemetry face sheet for immediately available ER MD    Location [facility]-Cardiac & Pulmonary Rehab    Staff Present Renita Papa, RN BSN;Melissa Caiola RDN, Rowe Pavy, BA, ACSM CEP, Exercise Physiologist    Virtual Visit No    Medication changes reported     No    Fall or balance concerns reported    No    Warm-up and Cool-down Performed on first and last piece of equipment    Resistance Training Performed Yes    VAD Patient? No    PAD/SET Patient? No      Pain Assessment   Currently in Pain? No/denies              Social History   Tobacco Use  Smoking Status Former Smoker  . Packs/day: 1.50  . Years: 30.00  . Pack years: 45.00  . Types: Cigarettes  . Quit date: 2007  . Years since quitting: 14.5  Smokeless Tobacco Former Systems developer  . Types: Chew  . Quit date: 2007  Tobacco Comment   Dip smokeless tobacco >20-30 years    Goals Met:  Independence with exercise equipment Exercise tolerated well No report of cardiac concerns or symptoms Strength training completed today  Goals Unmet:  Not Applicable  Comments: Pt able to follow exercise prescription today without complaint.  Will continue to monitor for progression.    Dr. Emily Filbert is Medical Director for [place] and [facility].

## 2020-03-02 NOTE — Telephone Encounter (Signed)
Called and spoke with pt's wife Jody. Jody stated that pt's ENT is referring pt to  [facility]. Pt is scheduled to see Oncology 7/30. She stated that pt is currently on the list for lung transplant but if the biopsy results show that it is cancer, pt will be dropped as a candidate for eligible lung transplant as pt has to be cancer free for 2 years.  [name] is wanting to know if pt's treatment will need to be changed if pt is dropped as a candidate for lung transplant especially if pt does have cancer. She stated that the biopsy results should be available today to be seen.  Due to so many questions wanting to discuss this with MR, I have scheduled pt a visit 7/20. Jody aware that this appt is at the [facility] office.nothing further needed.

## 2020-03-03 ENCOUNTER — Other Ambulatory Visit: Payer: Self-pay | Admitting: Otolaryngology

## 2020-03-03 DIAGNOSIS — C024 Malignant neoplasm of lingual tonsil: Secondary | ICD-10-CM

## 2020-03-06 LAB — SURGICAL PATHOLOGY

## 2020-03-07 ENCOUNTER — Other Ambulatory Visit: Payer: Self-pay

## 2020-03-07 ENCOUNTER — Encounter: Payer: Self-pay | Admitting: Internal Medicine

## 2020-03-07 ENCOUNTER — Encounter: Payer: Self-pay | Admitting: Family Medicine

## 2020-03-07 ENCOUNTER — Ambulatory Visit (INDEPENDENT_AMBULATORY_CARE_PROVIDER_SITE_OTHER): Payer: PPO | Admitting: Internal Medicine

## 2020-03-07 VITALS — BP 118/62 | HR 77 | Temp 98.7°F | Ht 71.0 in | Wt 150.4 lb

## 2020-03-07 DIAGNOSIS — C099 Malignant neoplasm of tonsil, unspecified: Secondary | ICD-10-CM | POA: Diagnosis not present

## 2020-03-07 DIAGNOSIS — J8489 Other specified interstitial pulmonary diseases: Secondary | ICD-10-CM | POA: Diagnosis not present

## 2020-03-07 DIAGNOSIS — Z85819 Personal history of malignant neoplasm of unspecified site of lip, oral cavity, and pharynx: Secondary | ICD-10-CM | POA: Insufficient documentation

## 2020-03-07 DIAGNOSIS — M359 Systemic involvement of connective tissue, unspecified: Secondary | ICD-10-CM | POA: Diagnosis not present

## 2020-03-07 DIAGNOSIS — J432 Centrilobular emphysema: Secondary | ICD-10-CM

## 2020-03-07 NOTE — Progress Notes (Signed)
OV 09/10/2019  Subjective:  Patient ID: Lawrence Wells, male , DOB: 29-Dec-1955 , age 64 y.o. , MRN: [id number] , ADDRESS: [place] [address]   09/10/2019 -   Chief Complaint  Patient presents with  . pulmonary consult    per Dr. Parks Ranger- CXR 09/08/2019. pt reports of sob with exertion, talking and bending, prod cough with yellow mucus, left sided chest discomfort  and wheezing.     HPI Lawrence Wells 64 y.o. -referred for interstitial lung disease after a chest x-ray from September 08, 2019 that I personally visualized and interpreted shows diffuse ILD along with left upper lobe nodule.  Patient has wife tell me that he has had insidious onset of shortness of breath for a year with progression in the last few months.  They also tell me that few years ago he had an chest x-ray done for an incidental unrelated reason that showed chronic changes of scarring but apparently were reassured.  In review of his chart and noticed that in 2005 this is chest x-ray done in the [facility] system with reports of ILD in it.  He is unaware of that.  History for this visit is given by the patient and his wife and review of the chart.  Integrated Comprehensive ILD Questionnaire  Symptoms:   Insidious onset of shortness of breath for the last year getting worse in the last 3 to 4 months.  Definitely no dyspnea a few years ago even though chest x-ray from 2005 was reported as ILD.  Sometimes dyspnea is episodic but mostly with exertion relieved by rest.  Symptom severity is listed below.  There is associated significant cough.  Cough started in October 2020.  It is getting worse cough is rated as moderate to severe.  He coughs at night.  He does bring up some yellow phlegm.  It is worse when he lies down.Marland Kitchen  He does clear his throat and he does feel a tickle in the back of the throat.  There is also associated wheezing.       Past Medical History : He gives a history positive  for arthritis.  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  Denies any asthma or known COPD.  Denies heart failure.  Denies scleroderma any collagen vascular disease.  Denies diagnosis of acid reflux or hiatal hernia.  Denies obstructive sleep apnea.  Denies pulmonary hypertension.  Denies diabetes or thyroid disease or stroke or seizures.  Denies infectious mononucleosis.  Denies hepatitis.  Denies tuberculosis denies kidney disease.  Denies blood clots denies heart disease denies pleurisy.   ROS: Positive for fatigue and arthralgia.  Positive for acid reflux.  He went to check the box for [name].  Denies any GI symptoms or rash or ulcers  FAMILY HISTORY of LUNG DISEASE: Denies including COPD and pulmonary fibrosis   EXPOSURE HISTORY: Smokes cigarettes between 1983 and 2007 1 to 2 packs/day and then quit.  Did not smoke cigars did not smoke pipe.  Did not vape.  Never smoked marijuana or cocaine.  Never used intravenous drugs.   HOME and HOBBY DETAILS : [place].  The age of the home is 20 years.  There is no dampness in the living environment.  No mold or mildew.  Does not use  humidifier.  No CPAP use.  Does not use nebulizer.  No steam iron use.  No Jacuzzi.  No pet birds or parakeets in the house.  No pet gerbils.  No feather pillows.  No mold in the [facility] duct.  Does not play wind instruments.  Does not do any gardening.   OCCUPATIONAL HISTORY (122 questions) : Works in the Transport planner for 30 years.  Worked in [employer] and crawl spaces.  A lot of dust exposure.  During this time he worked in damp air-conditioned spaces.  He thinks he had asbestos exposure.  He also worked in the [employer].  He cleaned AC coils.  Otherwise history is negative   PULMONARY TOXICITY HISTORY (27 items): Entirely  negative.       ROS - per HPI    OV 10/22/2019  Subjective:  Patient ID: Lawrence Wells, male , DOB: Lawrence 11, 1957 , age 64 y.o. , MRN: [id number] , ADDRESS: [place] [id number]   10/22/2019 -   Chief Complaint  Patient presents with  . Follow-up    ILD Followup. SOB on exertion. Non prod cough. Pt denies any wheezing, fever, chills, or sweats.   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  Started on nintedanib 10/12/2019  Associated emphysema present on CT scan January 2021  Normal cardiac stress test 10/14/2019   HPI Lawrence Wells 64 y.o. -presents for follow-up with his wife.  At last saw him end of January 2021 for ILD evaluation.  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  He then saw a rheumatologist Dr. D yesterday 10/21/2019 and given a diagnosis of joint rheumatoid arthritis.  In the interim he also saw pulmonary nurse practitioner for a virtual visit and was started on nintedanib but she started [place] on 10/12/2019.  So far is tolerating it fine.  He is using oxygen only with exertion.  There is no deterioration in symptoms.  Yesterday the rheumatology visit Imuran has been recommended first rheumatoid arthritis.  The TPMT test has been done and the results are pending.  He does not have liver function tests checked after he started his nintedanib.  He and his wife have many questions about the future course of the disease and natural history and other management strategies.  Currently is free was not helping him but he is willing to take it because of the associated emphysema.   I personally visualized and reviewed the  CT and also interpreted the findings myself   OV 12/02/2019  Subjective:  Patient ID: Lawrence Wells, male , DOB: [birth year] , age 64 y.o. , MRN: [id number] , ADDRESS: [place] [address]   12/02/2019 -   Chief Complaint  Patient presents with  . Follow-up    Follow-up rheumatoid arthritis-ILD/UIP pattern (based on high titer rheumatoid factor 09/10/2019, UIP on CT scan 09/14/2019 and rheumatology visit confirming diagnosis of rheumatoid arthritis on 10/21/2019].  Started on nintedanib 10/12/2019  Associated emphysema present on CT scan January 2021  Normal cardiac stress test 10/14/2019  Rheumatoid arthritis on Imuran  HPI Lawrence Wells 64 y.o. -6 returns for follow-up with his wife to  the [place] clinic.  He is a Engineer, manufacturing systems.  He normally seen in the [place] clinic.  At this point in time he tells me that he is tolerating the nintedanib fine.  He needs a liver function test.  However he has lost weight 160 pounds.  He does not know the reason.  He is tolerating nintedanib fine.  He is on Imuran for his rheumatoid arthritis.  In terms of his effort tolerance it is stable.  He is undergoing pulmonary rehabilitation and is using 3-4 L of oxygen with exertion.  He is still on the waiting list for a portable system.  He is willing to be seen by the transplant team for pulmonary fibrosis..    In terms of his rheumatoid arthritis he is on Imuran he is on a prednisone taper but still having pain.  He had her steroid injections.  He is not on Plaquenil.  I have encouraged him to talk to his rheumatologist about it.  Overall stable  Overall symptom score listed below.   OV 01/06/2020  Subjective:  Patient ID: Lawrence Wells, male , DOB: 30-Aug-1955 , age 72 y.o. , MRN: [id number] , ADDRESS: [address] [place] [address]   Follow-up rheumatoid arthritis-ILD/UIP pattern (based on high titer rheumatoid factor 09/10/2019, UIP on CT scan 09/14/2019 and  rheumatology visit confirming diagnosis of rheumatoid arthritis on 10/21/2019].  Started on nintedanib 10/12/2019  Associated emphysema present on CT scan January 2021  Normal cardiac stress test 10/14/2019  Rheumatoid arthritis on Imuran   01/06/2020 -     HPI Lawrence Wells 64 y.o. -there is a telephone visit.  Patient identified with 2 person identifier.  Wife also came on the phone.  He tells me in the wife also attest that since starting nintedanib he has continued to lose weight.  Today the weight is 159 pounds.  Diarrhea has started despite taking Imodium once a day.  The diarrhea is severe as documented by a level 4.  He is undergoing pulmonary rehabilitation and this is helped dyspnea but he is dealing with significant pain from the rheumatoid arthritis.  He is undergoing transplant evaluation at [facility] and is quite busy with that.  He has upcoming appointment with me in mid June 2021 for a face-to-face for pulmonary function test and follow-up.  But at this point in time symptoms of weight loss and diarrhea are paramount.     OV 03/07/2020   Subjective:  Patient ID: Lawrence Wells, male , DOB: 10-13-1955, age 61 y.o. years. , MRN: [id number],  ADDRESS: [place] [address] PCP  Olin Hauser, DO Providers : Treatment Team:  Attending Provider: Brand Males, MD   Chief Complaint  Patient presents with  . Follow-up    cough in morning     Follow-up rheumatoid arthritis-ILD/UIP pattern (based on high titer rheumatoid factor 09/10/2019, UIP on CT scan 09/14/2019 and rheumatology visit confirming diagnosis of rheumatoid arthritis on 10/21/2019].  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 -presents for follow-up with his wife.   After his last visit he had COVID-19.  He was hospitalized.  I communicated with the hospitalist.  Since then he is recovered but is now left with a worse  than baseline.  He is now requiring 4 L nasal cannula with exertion even though this is an improvement is worse than his April 2021 baseline.  In terms of his rheumatoid arthritis he is on Imuran and prednisone.  This controls the pain.  However post Covid he developed some throat swelling.  Initially thought was sinus infection.  He saw primary care this then resulted in ENT evaluation.  Mid July 2020 when he has been diagnosed with sudden onset of right tonsillar cancer.  He has [facility] appointment pending.  Because of all this he is no longer a candidate for [facility] lung transplant services.  He is doing pulmonary rehabilitation.  Also because of all this he is lost significant amount of weight as documented below.  Is lost 13 pounds since April 2021.  He is asking for handicap placard.  Symptom scores are listed below.  Overall high symptom burden.  He and his wife are very frustrated by the turn of events.   SYMPTOM SCALE - ILD 12/02/2019 On ofev, immuran, rehab  wegt 163# 159# 01/06/2020  03/07/2020 150#, ofev, immuran, pred 7.5 and 4L [place] iwt with exertion.  New dx of R Tonsillar cancer  O2 use ra  ra  Shortness of Breath 0 -> 5 scale with 5 being worst (score 6 If unable to do)    At rest 0  0  Simple tasks - showers, clothes change, eating, shaving 3  5  Household (dishes, doing bed, laundry) 3  5  Shopping 4  5  Walking level at own pace 3.5  5  Walking up Stairs 4 4 6   Total (30-36) Dyspnea Score 18  26  How bad is your cough? 3 2 5   How bad is your fatigue 3  4  How bad is nausea 0  4  How bad is vomiting?  0  0  How bad is diarrhea? 0 4 despite immodium 1 per day 2  How bad is anxiety? 2  2  How bad is depression 2  3  pain  2 0        Simple office walk 185 feet x  3 laps goal with forehead probe 09/10/2019   10/22/2019  .03/07/2020    O2 used ra ra - walk, uses 2L portable at home with exerion ra  Number laps completed 3 3 3  attempted but did only 1/2  Comments about pace mod    Resting Pulse Ox/HR 98% and 74/min 95% and 71 99% and 85/min  Final Pulse Ox/HR 88% and 96/min 86% and 101 88% a and 97/mi  Desaturated </= 88% yes yes   Desaturated <= 3% points Yes, 10 points Yes, 9 pon   Got Tachycardic >/= 90/min yes yes   Symptoms at end of test Yes "pretty heavy" yes   Miscellaneous comments Corrected 2L   Corrected with 4L [place]        ROS - per HPI     has a past medical history of Arthritis, Collagen vascular disease ([place]), COPD (chronic obstructive pulmonary disease) ([place]), Coronary artery disease, Dyspnea, Emphysema lung ([place]), Hyperlipidemia, and Pulmonary filariasis.   reports that he quit smoking about 14 years ago. His smoking use included cigarettes. He has a 45.00 pack-year smoking history. He quit smokeless tobacco use about 14 years ago.  His smokeless tobacco use included chew.  Past Surgical History:  Procedure Laterality Date  . SINUS SURGERY WITH INSTATRAK      Allergies  Allergen Reactions  .  Atorvastatin Other (See Comments)    Myalgias    Immunization History  Administered Date(s) Administered  . PFIZER SARS-COV-2 Vaccination 11/22/2019, 12/15/2019  . Pneumococcal Conjugate-13 09/08/2019  . Tdap 04/19/2015, 05/07/2017  . Zoster Recombinat (Shingrix) 09/29/2019, 11/27/2019    Family History  Problem Relation Age of Onset  . Heart disease Mother   . Heart attack Mother 29  . Arthritis Mother   . Heart disease Father   . Heart attack Father 69  . Healthy Sister   . Hyperlipidemia Brother   . Throat cancer Brother 44  . Heart disease Brother   . Healthy Son   . Healthy Daughter      Current Outpatient Medications:  .  albuterol (PROAIR HFA) 108 (90 Base) MCG/ACT inhaler, INHALE 2 PUFFS INTO THE LUNGS EVERY 4 HOURS AS NEEDED FOR WHEEZING OR SHORTNESS  OF BREATH OR COUGH, Disp: , Rfl:  .  apixaban (ELIQUIS) 5 MG TABS tablet, Take 1 tablet (5 mg total) by mouth 2 (two) times daily., Disp: 60 tablet, Rfl: 3 .  azaTHIOprine (IMURAN) 50 MG tablet, Take 2 tablets (100 mg total) by mouth daily., Disp: 180 tablet, Rfl: 0 .  diclofenac Sodium (VOLTAREN) 1 % GEL, Apply 2 g topically 3 (three) times daily as needed. (Patient taking differently: Apply 2 g topically 3 (three) times daily as needed (Pain (Arthritis)). ), Disp: 100 g, Rfl: 2 .  fluticasone (FLONASE) 50 MCG/ACT nasal spray, Place 2 sprays into both nostrils daily. Use for 4-6 weeks then stop and use seasonally or as needed., Disp: 16 g, Rfl: 3 .  Multiple Vitamin (MULTIVITAMIN) tablet, Take 1 tablet by mouth daily., Disp: , Rfl:  .  Nintedanib (OFEV) 150 MG CAPS, Please take 1 tablet oral daily for 1 week, then resume back to 1 tablet twice daily after  that, Disp: 60 capsule, Rfl: 11 .  OXYGEN, Inhale into the lungs as needed., Disp: , Rfl:  .  pantoprazole (PROTONIX) 40 MG tablet, Take 1 tablet (40 mg total) by mouth daily., Disp: 30 tablet, Rfl: 1 .  predniSONE (DELTASONE) 10 MG tablet, 4 x 3 days, 3 x 3 days, 2 x 3 days, 1 x 3 then resume 7.5 mg, Disp: 30 tablet, Rfl: 0 .  Tiotropium Bromide Monohydrate (SPIRIVA RESPIMAT) 2.5 MCG/ACT AERS, Inhale 2 puffs into the lungs daily., Disp: 1 g, Rfl: 5 .  valACYclovir (VALTREX) 500 MG tablet, Take 1 tablet (500 mg total) by mouth 2 (two) times daily as needed (herpes flare). For 3-7 days as needed for flare, Disp: 30 tablet, Rfl: 1 .  ezetimibe (ZETIA) 10 MG tablet, Take 1 tablet (10 mg total) by mouth daily., Disp: 90 tablet, Rfl: 3      Objective:   Vitals:   03/07/20 1636  BP: 118/62  Pulse: 77  Temp: 98.7 F (37.1 C)  TempSrc: Oral  SpO2: 93%  Weight: 150 lb 6.4 oz (68.2 kg)  Height: 5\' 11"  (1.803 m)    Estimated body mass index is 20.98 kg/m as calculated from the following:   Height as of this encounter: 5\' 11"  (1.803 m).    Weight as of this encounter: 150 lb 6.4 oz (68.2 kg).  @WEIGHTCHANGE @  [employer]   03/07/20 1636  Weight: 150 lb 6.4 oz (68.2 kg)     Physical Exam Thin male.  Pleasant.  Oral cavity shows prominence of the right tonsil no obvious obstruction but definitely prominent.  No obvious stridor.  Bilateral crackles.  Definitely has  lost some weight.  Pleasant        Assessment:       ICD-10-CM   1. Interstitial lung disease due to connective tissue disease ([place])  J84.89    M35.9   2. Centrilobular emphysema ([place])  J43.2   3. Tonsillar cancer ([place])  C09.9        Plan:     Patient Instructions     ICD-10-CM   1. Interstitial lung disease due to connective tissue disease ([place])  J84.89    M35.9   2. Centrilobular emphysema ([place])  J43.2   3. Tonsillar cancer ([place])  C09.9    You have improved since he had a Covid in terms of your chronic respiratory failure but I think you are at a new lower baseline  Transplant not an option at this point in time because of new diagnosis of tonsillar cancer on the right side  Plan -= Continue inhaler Spiriva per schedule -Continue Ofev per schedule -Continue oxygen per schedule -Do handicap sticker -Imuran and prednisone for your rheumatologist -Keep appeared new [facility] appointment for tonsillar cancer and get that taken care of -Continue pulmonary rehabilitation to the extent possible to keep your self it -Any worsening drooling of saliva or difficulty breathing or change in voice go to the ER to protect your throat and airway  Follow-up -Make sure they do liver function test when you visit with [place] 2021 appointment with Dr. Chase Caller -Do a 30-minute office follow-up with Dr. Chase Caller in September 2021 in [place] schedule not out yet]   ( Level 05 visit: Estb 40-54 min   in  visit type: on-site physical face to visit  in total care time and counseling or/and coordination of care by this  undersigned MD - Dr Brand Males. This includes one or more of the following on this same day 03/07/2020: pre-charting, chart review, note writing, documentation discussion of test results, diagnostic or treatment recommendations, prognosis, risks and benefits of management options, instructions, education, compliance or risk-factor reduction. It excludes time spent by the [place] or office staff in the care of the patient. Actual time 40 min)   SIGNATURE    Dr. Brand Males, M.D., F.C.C.P,  Pulmonary and Critical Care Medicine Staff Physician, [place] Director - Interstitial Lung Disease  Program  Pulmonary [place] [facility] [place], [place], [address]  Pager: [phone], If no answer or between  15:00h - 7:00h: call [phone] Telephone: [phone]  6:29 PM 03/07/2020

## 2020-03-07 NOTE — Patient Instructions (Signed)
ICD-10-CM   1. Interstitial lung disease due to connective tissue disease ([place])  J84.89    M35.9   2. Centrilobular emphysema ([place])  J43.2   3. Tonsillar cancer ([place])  C09.9    You have improved since he had a Covid in terms of your chronic respiratory failure but I think you are at a new lower baseline  Transplant not an option at this point in time because of new diagnosis of tonsillar cancer on the right side  Plan -= Continue inhaler Spiriva per schedule -Continue Ofev per schedule -Continue oxygen per schedule -Do handicap sticker -Imuran and prednisone for your rheumatologist -Keep appeared new [facility] appointment for tonsillar cancer and get that taken care of -Continue pulmonary rehabilitation to the extent possible to keep your self it -Any worsening drooling of saliva or difficulty breathing or change in voice go to the ER to protect your throat and airway  Follow-up -Make sure they do liver function test when you visit with [place] August 2021 appointment with Dr. Chase Caller -Do a 30-minute office follow-up with Dr. Chase Caller in September 2021 in [place] schedule not out yet]

## 2020-03-08 ENCOUNTER — Encounter: Payer: PPO | Admitting: *Deleted

## 2020-03-08 DIAGNOSIS — J849 Interstitial pulmonary disease, unspecified: Secondary | ICD-10-CM | POA: Diagnosis not present

## 2020-03-08 NOTE — Progress Notes (Signed)
Daily Session Note  Patient Details  Name: Lawrence Wells MRN: [id number] Date of Birth: 11-Aug-1956 Referring Provider:     Pulmonary Rehab from 10/28/2019 in [facility] Cardiac and Pulmonary Rehab  Referring Provider Brand Males MD      Encounter Date: 03/08/2020  Check In:  Session Check In - 03/08/20 0727      Check-In   Supervising physician immediately available to respond to emergencies See telemetry face sheet for immediately available ER MD    Location [facility]-Cardiac & Pulmonary Rehab    Staff Present Renita Papa, RN BSN;Jessica [place], MA, RCEP, CCRP, CCET;Amanda Sommer, [place], ACSM CEP, Exercise Physiologist    Virtual Visit No    Medication changes reported     No    Fall or balance concerns reported    No    Warm-up and Cool-down Performed on first and last piece of equipment    Resistance Training Performed Yes    VAD Patient? No    PAD/SET Patient? No      Pain Assessment   Currently in Pain? No/denies              Social History   Tobacco Use  Smoking Status Former Smoker  . Packs/day: 1.50  . Years: 30.00  . Pack years: 45.00  . Types: Cigarettes  . Quit date: 2007  . Years since quitting: 14.5  Smokeless Tobacco Former Systems developer  . Types: Chew  . Quit date: 2007  Tobacco Comment   Dip smokeless tobacco >20-30 years    Goals Met:  Independence with exercise equipment Exercise tolerated well No report of cardiac concerns or symptoms Strength training completed today  Goals Unmet:  Not Applicable  Comments: Pt able to follow exercise prescription today without complaint.  Will continue to monitor for progression.    Dr. Emily Filbert is Medical Director for [place] and [facility].

## 2020-03-09 ENCOUNTER — Other Ambulatory Visit: Payer: PPO

## 2020-03-09 ENCOUNTER — Encounter: Payer: PPO | Admitting: *Deleted

## 2020-03-09 ENCOUNTER — Other Ambulatory Visit: Payer: Self-pay

## 2020-03-09 DIAGNOSIS — J849 Interstitial pulmonary disease, unspecified: Secondary | ICD-10-CM | POA: Diagnosis not present

## 2020-03-09 NOTE — Progress Notes (Signed)
Daily Session Note  Patient Details  Name: LARRI YEHLE MRN: [id number] Date of Birth: 1956/02/09 Referring Provider:     Pulmonary Rehab from 10/28/2019 in [facility] Cardiac and Pulmonary Rehab  Referring Provider Brand Males MD      Encounter Date: 03/09/2020  Check In:  Session Check In - 03/09/20 0829      Check-In   Supervising physician immediately available to respond to emergencies See telemetry face sheet for immediately available ER MD    Location [facility]-Cardiac & Pulmonary Rehab    Staff Present Heath Lark, RN, BSN, CCRP;Jessica [place], MA, RCEP, CCRP, [place], [place], ACSM CEP, Exercise Physiologist    Virtual Visit No    Medication changes reported     No    Fall or balance concerns reported    No    Warm-up and Cool-down Performed on first and last piece of equipment    Resistance Training Performed Yes    VAD Patient? No    PAD/SET Patient? No      Pain Assessment   Currently in Pain? No/denies              Social History   Tobacco Use  Smoking Status Former Smoker  . Packs/day: 1.50  . Years: 30.00  . Pack years: 45.00  . Types: Cigarettes  . Quit date: 2007  . Years since quitting: 14.5  Smokeless Tobacco Former Systems developer  . Types: Chew  . Quit date: 2007  Tobacco Comment   Dip smokeless tobacco >20-30 years    Goals Met:  Proper associated with RPD/PD & O2 Sat Independence with exercise equipment Exercise tolerated well No report of cardiac concerns or symptoms  Goals Unmet:  Not Applicable  Comments: Pt able to follow exercise prescription today without complaint.  Will continue to monitor for progression.    Dr. Emily Filbert is Medical Director for [place] and [facility].

## 2020-03-10 DIAGNOSIS — R634 Abnormal weight loss: Secondary | ICD-10-CM | POA: Diagnosis not present

## 2020-03-10 DIAGNOSIS — Z87891 Personal history of nicotine dependence: Secondary | ICD-10-CM | POA: Diagnosis not present

## 2020-03-10 DIAGNOSIS — Z9981 Dependence on supplemental oxygen: Secondary | ICD-10-CM | POA: Diagnosis not present

## 2020-03-10 DIAGNOSIS — E639 Nutritional deficiency, unspecified: Secondary | ICD-10-CM | POA: Diagnosis not present

## 2020-03-10 DIAGNOSIS — Z79899 Other long term (current) drug therapy: Secondary | ICD-10-CM | POA: Diagnosis not present

## 2020-03-10 DIAGNOSIS — Z6821 Body mass index (BMI) 21.0-21.9, adult: Secondary | ICD-10-CM | POA: Diagnosis not present

## 2020-03-10 DIAGNOSIS — J9611 Chronic respiratory failure with hypoxia: Secondary | ICD-10-CM | POA: Diagnosis not present

## 2020-03-10 DIAGNOSIS — C099 Malignant neoplasm of tonsil, unspecified: Secondary | ICD-10-CM | POA: Diagnosis not present

## 2020-03-10 DIAGNOSIS — J84112 Idiopathic pulmonary fibrosis: Secondary | ICD-10-CM | POA: Diagnosis not present

## 2020-03-10 DIAGNOSIS — R1312 Dysphagia, oropharyngeal phase: Secondary | ICD-10-CM | POA: Diagnosis not present

## 2020-03-10 NOTE — Progress Notes (Signed)
Tumor Board Documentation  Lawrence Wells was presented by Dr Pryor Ochoa at our Tumor Board on 03/09/2020, which included representatives from medical oncology, radiation oncology, surgical oncology, internal medicine, navigation, pathology, radiology, surgical, pharmacy, genetics, palliative care, pulmonology.  Lawrence Wells currently presents as an external consult, for [place], for new positive pathology with history of the following treatments: surgical intervention(s).  Additionally, we reviewed previous medical and familial history, history of present illness, and recent lab results along with all available histopathologic and imaging studies. The tumor board considered available treatment options and made the following recommendations: Additional screening (PET Scan) Radiation and possible Chemotherapy  The following procedures/referrals were also placed: No orders of the defined types were placed in this encounter.   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  Tumor board is a meeting of clinicians from various specialty areas who evaluate and discuss patients for whom a multidisciplinary approach is being considered. Final determinations in the plan of care are those of the provider(s). The responsibility for follow up of recommendations given during tumor board is that of the provider.   Today's extended care, comprehensive team conference, Lawrence Wells was not present for the discussion and was not examined.   Multidisciplinary Tumor Board is a multidisciplinary case peer review process.  Decisions discussed in the Multidisciplinary Tumor Board reflect the opinions of the specialists present at the conference without having examined the patient.  Ultimately, treatment and diagnostic decisions rest  with the primary provider(s) and the patient.

## 2020-03-14 ENCOUNTER — Ambulatory Visit
Admission: RE | Admit: 2020-03-14 | Discharge: 2020-03-14 | Disposition: A | Discharge status: Home / Self Care | Payer: PPO | Source: Ambulatory Visit | Attending: Otolaryngology | Admitting: Otolaryngology

## 2020-03-14 ENCOUNTER — Other Ambulatory Visit: Payer: Self-pay

## 2020-03-14 DIAGNOSIS — C024 Malignant neoplasm of lingual tonsil: Secondary | ICD-10-CM | POA: Diagnosis not present

## 2020-03-14 DIAGNOSIS — I700 Atherosclerosis of aorta: Secondary | ICD-10-CM | POA: Diagnosis not present

## 2020-03-14 DIAGNOSIS — J432 Centrilobular emphysema: Secondary | ICD-10-CM | POA: Diagnosis not present

## 2020-03-14 DIAGNOSIS — I2510 Atherosclerotic heart disease of native coronary artery without angina pectoris: Secondary | ICD-10-CM | POA: Insufficient documentation

## 2020-03-14 DIAGNOSIS — J8410 Pulmonary fibrosis, unspecified: Secondary | ICD-10-CM | POA: Insufficient documentation

## 2020-03-14 DIAGNOSIS — I7789 Other specified disorders of arteries and arterioles: Secondary | ICD-10-CM | POA: Diagnosis not present

## 2020-03-14 LAB — GLUCOSE, CAPILLARY: Glucose-Capillary: 76 mg/dL (ref 70–99)

## 2020-03-14 MED ORDER — FLUDEOXYGLUCOSE F - 18 (FDG) INJECTION
7.6000 | Freq: Once | INTRAVENOUS | Status: AC | PRN
  Administered 2020-03-14: 8.11 via INTRAVENOUS

## 2020-03-15 ENCOUNTER — Encounter: Payer: PPO | Admitting: *Deleted

## 2020-03-15 DIAGNOSIS — J849 Interstitial pulmonary disease, unspecified: Secondary | ICD-10-CM | POA: Diagnosis not present

## 2020-03-15 NOTE — Progress Notes (Signed)
Daily Session Note  Patient Details  Name: Lawrence Wells MRN: [id number] Date of Birth: [birth year] Referring Provider:     Pulmonary Rehab from 10/28/2019 in [facility] Cardiac and Pulmonary Rehab  Referring Provider Brand Males MD      Encounter Date: 03/15/2020  Check In:  Session Check In - 03/15/20 0751      Check-In   Supervising physician immediately available to respond to emergencies See telemetry face sheet for immediately available ER MD    Location [facility]-Cardiac & Pulmonary Rehab    Staff Present Heath Lark, RN, BSN, CCRP;Melissa Morningside RDN, Rowe Pavy, BA, ACSM CEP, Exercise Physiologist    Virtual Visit No    Medication changes reported     No    Fall or balance concerns reported    No    Warm-up and Cool-down Performed on first and last piece of equipment    Resistance Training Performed Yes    VAD Patient? No    PAD/SET Patient? No      Pain Assessment   Currently in Pain? No/denies              Social History   Tobacco Use  Smoking Status Former Smoker  . Packs/day: 1.50  . Years: 30.00  . Pack years: 45.00  . Types: Cigarettes  . Quit date: 2007  . Years since quitting: 14.5  Smokeless Tobacco Former Systems developer  . Types: Chew  . Quit date: 2007  Tobacco Comment   Dip smokeless tobacco >20-30 years    Goals Met:  Proper associated with RPD/PD & O2 Sat Independence with exercise equipment Exercise tolerated well No report of cardiac concerns or symptoms  Goals Unmet:  Not Applicable  Comments: Pt able to follow exercise prescription today without complaint.  Will continue to monitor for progression.    Dr. Emily Filbert is Medical Director for [place] and [facility].

## 2020-03-16 DIAGNOSIS — C109 Malignant neoplasm of oropharynx, unspecified: Secondary | ICD-10-CM | POA: Insufficient documentation

## 2020-03-16 DIAGNOSIS — J849 Interstitial pulmonary disease, unspecified: Secondary | ICD-10-CM | POA: Diagnosis not present

## 2020-03-21 DIAGNOSIS — R1310 Dysphagia, unspecified: Secondary | ICD-10-CM | POA: Diagnosis not present

## 2020-03-21 DIAGNOSIS — C099 Malignant neoplasm of tonsil, unspecified: Secondary | ICD-10-CM | POA: Diagnosis not present

## 2020-03-21 DIAGNOSIS — J849 Interstitial pulmonary disease, unspecified: Secondary | ICD-10-CM | POA: Diagnosis not present

## 2020-03-21 DIAGNOSIS — Z87891 Personal history of nicotine dependence: Secondary | ICD-10-CM | POA: Diagnosis not present

## 2020-03-22 ENCOUNTER — Encounter: Payer: PPO | Attending: Internal Medicine

## 2020-03-22 DIAGNOSIS — Z87891 Personal history of nicotine dependence: Secondary | ICD-10-CM | POA: Insufficient documentation

## 2020-03-22 DIAGNOSIS — J439 Emphysema, unspecified: Secondary | ICD-10-CM | POA: Insufficient documentation

## 2020-03-22 DIAGNOSIS — J849 Interstitial pulmonary disease, unspecified: Secondary | ICD-10-CM | POA: Insufficient documentation

## 2020-03-22 DIAGNOSIS — E785 Hyperlipidemia, unspecified: Secondary | ICD-10-CM | POA: Insufficient documentation

## 2020-03-22 DIAGNOSIS — I2510 Atherosclerotic heart disease of native coronary artery without angina pectoris: Secondary | ICD-10-CM | POA: Insufficient documentation

## 2020-03-22 DIAGNOSIS — M1990 Unspecified osteoarthritis, unspecified site: Secondary | ICD-10-CM | POA: Insufficient documentation

## 2020-03-22 DIAGNOSIS — Z79899 Other long term (current) drug therapy: Secondary | ICD-10-CM | POA: Insufficient documentation

## 2020-03-22 DIAGNOSIS — Z7952 Long term (current) use of systemic steroids: Secondary | ICD-10-CM | POA: Insufficient documentation

## 2020-03-24 DIAGNOSIS — C099 Malignant neoplasm of tonsil, unspecified: Secondary | ICD-10-CM | POA: Diagnosis not present

## 2020-03-25 NOTE — Progress Notes (Signed)
Cardiology Office Note  Date:  03/27/2020   ID:  Lawrence Wells, DOB 1955-09-29, MRN [id number]  PCP:  Olin Hauser, DO   Chief Complaint  Patient presents with  . Other    6 month folllow up. patient c/o chest pain and SOB. Meds reviewed verbally with paitent.     HPI:  Lawrence Wells is a 64 yo male with PMH of  Hyperlipidemia, notes indicating intolerance of Lipitor and Crestor, able to tolerate Zetia Former smoker stopped 2017 ILD/Pulmonary fibrosis PAF June 2021 on Eliquis Is for follow-up of his coronary calcification on CT scan  Last seen in clinic February 2021 History of diffuse ILD along with left upper lobe nodule.   Followed by pulmonary insidious onset of shortness of breath   CT scan chest 09/14/2019 reviewed on prior clinic visit Minimal carotid and aortic atherosclerosis noted  moderate diffuse coronary calcification noted LAD and RCA   Seen by cardiology at [facility] in preparation for possible lung transplant  Paroxysmal atrial fibrillation diagnosed during inpatient admission in June 2021, was symptomatic with palpitations and chest tightness On Eliquis 5 twice daily Was not placed on any rate slowing agents New left foot drop, treated with prednisone, meloxicam  Had echocardiogram with ejection fraction 50%, moderate RV systolic dysfunction Anterior wall hypokinesis,  Also diagnosed with right tonsillar squamous cell carcinoma, scheduled to start chemoradiation therapy  Most of his visit today spent discussing recent diagnosis of his cancer Reviewed lab work from [facility], elevated potassium, elevated BNP and white blood count discussed  Continues to use oxygen Sitting on the couch more  He has had 35 pound weight loss from eating less over the past year Difficulty swallowing in the setting of his throat cancer  EKG personally reviewed by myself on todays visit Normal sinus rhythm 82 bpm left axis deviation no significant ST or T wave  changes   PMH:   has a past medical history of Arthritis, Collagen vascular disease ([place]), COPD (chronic obstructive pulmonary disease) ([place]), Coronary artery disease, Dyspnea, Emphysema lung ([place]), Hyperlipidemia, and Pulmonary filariasis.  PSH:    Past Surgical History:  Procedure Laterality Date  . SINUS SURGERY WITH INSTATRAK      Current Outpatient Medications  Medication Sig Dispense Refill  . albuterol (PROAIR HFA) 108 (90 Base) MCG/ACT inhaler INHALE 2 PUFFS INTO THE LUNGS EVERY 4 HOURS AS NEEDED FOR WHEEZING OR SHORTNESS OF BREATH OR COUGH    . apixaban (ELIQUIS) 5 MG TABS tablet Take 1 tablet (5 mg total) by mouth 2 (two) times daily. 60 tablet 3  . azaTHIOprine (IMURAN) 50 MG tablet Take 2 tablets (100 mg total) by mouth daily. 180 tablet 0  . diclofenac Sodium (VOLTAREN) 1 % GEL Apply 2 g topically 3 (three) times daily as needed. (Patient taking differently: Apply 2 g topically 3 (three) times daily as needed (Pain (Arthritis)). ) 100 g 2  . ezetimibe (ZETIA) 10 MG tablet Take 1 tablet (10 mg total) by mouth daily. 90 tablet 3  . fluticasone (FLONASE) 50 MCG/ACT nasal spray Place 2 sprays into both nostrils daily. Use for 4-6 weeks then stop and use seasonally or as needed. 16 g 3  . Multiple Vitamin (MULTIVITAMIN) tablet Take 1 tablet by mouth daily.    . Nintedanib (OFEV) 150 MG CAPS Please take 1 tablet oral daily for 1 week, then resume back to 1 tablet twice daily after  that 60 capsule 11  . OXYGEN Inhale into the lungs as  needed.    . predniSONE (DELTASONE) 10 MG tablet 4 x 3 days, 3 x 3 days, 2 x 3 days, 1 x 3 then resume 7.5 mg 30 tablet 0  . Tiotropium Bromide Monohydrate (SPIRIVA RESPIMAT) 2.5 MCG/ACT AERS Inhale 2 puffs into the lungs daily. 1 g 5  . valACYclovir (VALTREX) 500 MG tablet Take 1 tablet (500 mg total) by mouth 2 (two) times daily as needed (herpes flare). For 3-7 days as needed for flare 30 tablet 1  . pantoprazole (PROTONIX) 40 MG tablet Take 1 tablet  (40 mg total) by mouth daily. 30 tablet 1   No current facility-administered medications for this visit.     Allergies:   Atorvastatin   Social History:  The patient  reports that he quit smoking about 14 years ago. His smoking use included cigarettes. He has a 45.00 pack-year smoking history. He quit smokeless tobacco use about 14 years ago.  His smokeless tobacco use included chew. He reports current alcohol use. He reports that he does not use drugs.   Family History:   family history includes Arthritis in his mother; Healthy in his daughter, sister, and son; Heart attack (age of onset: 37) in his father; Heart attack (age of onset: 20) in his mother; Heart disease in his brother, father, and mother; Hyperlipidemia in his brother; Throat cancer (age of onset: 70) in his brother.    Review of Systems: Review of Systems  Constitutional: Negative.   HENT: Negative.   Respiratory: Positive for shortness of breath.   Cardiovascular: Negative.   Gastrointestinal:       Throat pain  Musculoskeletal: Negative.   Neurological: Negative.   Psychiatric/Behavioral: Negative.   All other systems reviewed and are negative.   PHYSICAL EXAM: VS:  BP 90/60 (BP Location: Left Arm, Patient Position: Sitting, Cuff Size: Normal)   Pulse 82   Ht 5\' 11"  (1.803 m)   Wt 146 lb (66.2 kg)   BMI 20.36 kg/m  , BMI Body mass index is 20.36 kg/m. Constitutional:  oriented to person, place, and time. No distress.  HENT: Fullness in the neck more on the right Head: Grossly normal Eyes:  no discharge. No scleral icterus.  Neck: No JVD, no carotid bruits  Cardiovascular: Regular rate and rhythm, no murmurs appreciated Pulmonary/Chest: Scattered Rales Abdominal: Soft.  no distension.  no tenderness.  Musculoskeletal: Normal range of motion Neurological:  normal muscle tone. Coordination normal. No atrophy Skin: Skin warm and dry Psychiatric: normal affect, pleasant    Recent Labs: 01/21/2020: B  Natriuretic Peptide 271.4 01/23/2020: Hemoglobin 13.1; Platelets 472 02/08/2020: ALT 17; BUN 32; Creatinine, Ser 0.76; Potassium 4.8; Pro B Natriuretic peptide (BNP) 122.0; Sodium 135    Lipid Panel Lab Results  Component Value Date   CHOL 210 (H) 09/02/2019   HDL 51 09/02/2019   LDLCALC 134 (H) 09/02/2019   TRIG 139 09/02/2019      Wt Readings from Last 3 Encounters:  03/27/20 146 lb (66.2 kg)  03/07/20 150 lb 6.4 oz (68.2 kg)  02/25/20 155 lb (70.3 kg)     ASSESSMENT AND PLAN:  Problem List Items Addressed This Visit      Cardiology Problems   Hypercholesterolemia   Coronary artery calcification   Relevant Orders   EKG 12-Lead     Other   Centrilobular emphysema (HCC)   Pulmonary fibrosis ([place]) - Primary   Former smoker    Other Visit Diagnoses    PAF (paroxysmal atrial fibrillation) ([place])  Relevant Orders   EKG 12-Lead     COPD Pulmonary fibrosis, on oxygen Reports symptoms are stable, has some tightness in the chest at times  Coronary artery disease Prior CT scan showing Heavy calcification LAD, RCA, some in the left circumflex Minimal in the aorta and carotids Prior stress test early 2021 with no ischemia Some concern for hypokinesis anterior wall on echocardiogram at [facility] off on catheterization at this time and monitor  Hyperlipidemia Unable to tolerate Crestor Did not tolerate Lipitor, on Zetia  Paroxysmal atrial fibrillation Noted in the hospital earlier in 2021, continue Eliquis Normal sinus rhythm on today's visit  Pulmonary fibrosis Followed by pulmonary, on oxygen, documented on CT scan    Total encounter time more than 25 minutes  Greater than 50% was spent in counseling and coordination of care with the patient   Signed, Esmond Plants, M.D., Ph.D. [place], [place]

## 2020-03-27 ENCOUNTER — Ambulatory Visit (INDEPENDENT_AMBULATORY_CARE_PROVIDER_SITE_OTHER): Payer: PPO | Admitting: Cardiovascular Disease

## 2020-03-27 ENCOUNTER — Other Ambulatory Visit: Payer: Self-pay

## 2020-03-27 ENCOUNTER — Encounter: Payer: Self-pay | Admitting: Cardiovascular Disease

## 2020-03-27 VITALS — BP 90/60 | HR 82 | Ht 71.0 in | Wt 146.0 lb

## 2020-03-27 DIAGNOSIS — J849 Interstitial pulmonary disease, unspecified: Secondary | ICD-10-CM | POA: Diagnosis not present

## 2020-03-27 DIAGNOSIS — J439 Emphysema, unspecified: Secondary | ICD-10-CM | POA: Diagnosis not present

## 2020-03-27 DIAGNOSIS — E7800 Pure hypercholesterolemia, unspecified: Secondary | ICD-10-CM | POA: Diagnosis not present

## 2020-03-27 DIAGNOSIS — J8410 Pulmonary fibrosis, unspecified: Secondary | ICD-10-CM | POA: Diagnosis not present

## 2020-03-27 DIAGNOSIS — I480 Paroxysmal atrial fibrillation: Secondary | ICD-10-CM | POA: Diagnosis not present

## 2020-03-27 DIAGNOSIS — Z87891 Personal history of nicotine dependence: Secondary | ICD-10-CM

## 2020-03-27 DIAGNOSIS — Z79899 Other long term (current) drug therapy: Secondary | ICD-10-CM | POA: Diagnosis not present

## 2020-03-27 DIAGNOSIS — J432 Centrilobular emphysema: Secondary | ICD-10-CM | POA: Diagnosis not present

## 2020-03-27 DIAGNOSIS — I2510 Atherosclerotic heart disease of native coronary artery without angina pectoris: Secondary | ICD-10-CM

## 2020-03-27 DIAGNOSIS — E785 Hyperlipidemia, unspecified: Secondary | ICD-10-CM | POA: Diagnosis not present

## 2020-03-27 DIAGNOSIS — I2584 Coronary atherosclerosis due to calcified coronary lesion: Secondary | ICD-10-CM | POA: Diagnosis not present

## 2020-03-27 DIAGNOSIS — Z7952 Long term (current) use of systemic steroids: Secondary | ICD-10-CM | POA: Diagnosis not present

## 2020-03-27 DIAGNOSIS — M1990 Unspecified osteoarthritis, unspecified site: Secondary | ICD-10-CM | POA: Diagnosis not present

## 2020-03-27 NOTE — Patient Instructions (Addendum)
Medication Instructions:  No changes  If you need a refill on your cardiac medications before your next appointment, please call your pharmacy.    Lab work: Potassium level today.    If you have labs (blood work) drawn today and your tests are completely normal, you will receive your results only by: Marland Kitchen MyChart Message (if you have MyChart) OR . A paper copy in the mail If you have any lab test that is abnormal or we need to change your treatment, we will call you to review the results.   Testing/Procedures: No new testing needed   Follow-Up: At [facility], you and your health needs are our priority.  As part of our continuing mission to provide you with exceptional heart care, we have created designated Provider Care Teams.  These Care Teams include your primary Cardiologist (physician) and Advanced Practice Providers (APPs -  Physician Assistants and Nurse Practitioners) who all work together to provide you with the care you need, when you need it.  . You will need a follow up appointment in 6 months   . Providers on your designated Care Team:   . Murray Hodgkins, NP . Christell Faith, PA-C . Marrianne Mood, PA-C  Any Other Special Instructions Will Be Listed Below (If Applicable).  For educational health videos Log in to : [email or web address] Or : [email or web address], password : [email or web address]

## 2020-03-28 LAB — POTASSIUM: Potassium: 4.7 mmol/L (ref 3.5–5.2)

## 2020-03-28 MED ORDER — EZETIMIBE 10 MG PO TABS: 10.0000 mg | ORAL_TABLET | Freq: Every day | ORAL | 3 refills | Status: DC

## 2020-03-28 MED ORDER — APIXABAN 5 MG PO TABS: 5.0000 mg | ORAL_TABLET | Freq: Two times a day (BID) | ORAL | 3 refills | Status: DC

## 2020-03-28 NOTE — Addendum Note (Signed)
Addended by: Valora Corporal on: 03/28/2020 04:53 PM   Modules accepted: Orders

## 2020-03-29 ENCOUNTER — Telehealth: Payer: Self-pay | Admitting: *Deleted

## 2020-03-29 ENCOUNTER — Encounter: Payer: Self-pay | Admitting: *Deleted

## 2020-03-29 DIAGNOSIS — J849 Interstitial pulmonary disease, unspecified: Secondary | ICD-10-CM

## 2020-03-29 NOTE — Telephone Encounter (Signed)
Called to check on patient.  Spoke to his wife.  He will be starting cancer treatments on 8/23.  He has not been feeling well and would like to discharge at this time.

## 2020-03-29 NOTE — Progress Notes (Signed)
Discharge Progress Report  Patient Details  Name: Lawrence Wells MRN: [id number] Date of Birth: 1955/12/07 Referring Provider:     Pulmonary Rehab from 10/28/2019 in [facility] Cardiac and Pulmonary Rehab  Referring Provider Brand Males MD       Number of Visits: 30  Reason for Discharge:  Early Exit:  Personal  Smoking History:  Social History   Tobacco Use  Smoking Status Former Smoker  . Packs/day: 1.50  . Years: 30.00  . Pack years: 45.00  . Types: Cigarettes  . Quit date: 2007  . Years since quitting: 14.6  Smokeless Tobacco Former Systems developer  . Types: Chew  . Quit date: 2007  Tobacco Comment   Dip smokeless tobacco >20-30 years    Diagnosis:  ILD (interstitial lung disease) ([place])  ADL UCSD:  Pulmonary Assessment Scores    Row Name 10/27/19 1117         ADL UCSD   ADL Phase Entry     SOB Score total 41     Rest 0     Walk 3     Stairs 4     Bath 2     Dress 2     Shop 1       CAT Score   CAT Score 15            Initial Exercise Prescription:  Initial Exercise Prescription - 10/28/19 1100      Date of Initial Exercise RX and Referring Provider   Date 10/28/19    Referring Provider Brand Males MD      Oxygen   Oxygen Continuous    Liters 2      Treadmill   MPH 2.4    Grade 0.5    Minutes 15    METs 3      NuStep   Level 3    SPM 80    Minutes 15    METs 3      Elliptical   Level 1    Speed 3    Minutes 15    METs 3      Biostep-RELP   Level 3    SPM 50    Minutes 15    METs 3      Prescription Details   Frequency (times per week) 2    Duration Progress to 30 minutes of continuous aerobic without signs/symptoms of physical distress      Intensity   THRR 40-80% of Max Heartrate 98-138    Ratings of Perceived Exertion 11-13    Perceived Dyspnea 0-4      Progression   Progression Continue to progress workloads to maintain intensity without signs/symptoms of physical distress.      Resistance Training   Training  Prescription Yes    Weight 5 lb    Reps 10-15           Discharge Exercise Prescription (Final Exercise Prescription Changes):  Exercise Prescription Changes - 03/14/20 1300      Response to Exercise   Blood Pressure (Admit) 90/56    Blood Pressure (Exercise) 108/62    Blood Pressure (Exit) 110/68    Heart Rate (Admit) 80 bpm    Heart Rate (Exercise) 97 bpm    Heart Rate (Exit) 94 bpm    Oxygen Saturation (Admit) 96 %    Oxygen Saturation (Exercise) 89 %    Oxygen Saturation (Exit) 98 %    Rating of Perceived Exertion (Exercise) 13    Perceived Dyspnea (Exercise)  3    Symptoms SOB    Duration Continue with 30 min of aerobic exercise without signs/symptoms of physical distress.    Intensity THRR unchanged      Progression   Progression Continue to progress workloads to maintain intensity without signs/symptoms of physical distress.    Average METs 2.4      Resistance Training   Training Prescription Yes    Weight 5 lb    Reps 10-15      Interval Training   Interval Training No      Oxygen   Oxygen Continuous    Liters 3-6      Treadmill   MPH 1.7    Grade 0    Minutes 15    METs 2.3      REL-XR   Level 6    Watts 50    Minutes 15           Functional Capacity:  6 Minute Walk    Row Name 10/28/19 1107         6 Minute Walk   Phase Initial     Distance 1300 feet     Walk Time 6 minutes     # of Rest Breaks 0     MPH 2.46     METS 3.53     RPE 7     Perceived Dyspnea  1     VO2 Peak 12.37     Symptoms No     Resting HR 60 bpm     Resting BP 130/74     Resting Oxygen Saturation  95 %     Exercise Oxygen Saturation  during 6 min walk 79 %     Max Ex. HR 95 bpm     Max Ex. BP 146/72     2 Minute Post BP 134/70       Interval HR   1 Minute HR 82     2 Minute HR 89     3 Minute HR 89     4 Minute HR 95     5 Minute HR 92     6 Minute HR 93     2 Minute Post HR 67     Interval Heart Rate? Yes       Interval Oxygen   Interval Oxygen?  Yes     Baseline Oxygen Saturation % 95 %     1 Minute Oxygen Saturation % 89 %     1 Minute Liters of Oxygen 0 L  Room Air     2 Minute Oxygen Saturation % 86 %     2 Minute Liters of Oxygen 0 L     3 Minute Oxygen Saturation % 82 %     3 Minute Liters of Oxygen 0 L     4 Minute Oxygen Saturation % 81 %     4 Minute Liters of Oxygen 0 L     5 Minute Oxygen Saturation % 80 %     5 Minute Liters of Oxygen 0 L     6 Minute Oxygen Saturation % 79 %     6 Minute Liters of Oxygen 0 L     2 Minute Post Oxygen Saturation % 91 %     2 Minute Post Liters of Oxygen 0 L            Psychological, QOL, Others - Outcomes: PHQ 2/9: Depression screen [facility] 2/9 12/08/2019 12/01/2019 10/28/2019 09/08/2019 07/19/2019  Decreased Interest  0 0 1 0 0  Down, Depressed, Hopeless 1 0 0 0 0  PHQ - 2 Score 1 0 1 0 0  Altered sleeping 1 1 2  - -  Tired, decreased energy 1 1 1  - -  Change in appetite 0 0 0 - -  Feeling bad or failure about yourself  0 1 1 - -  Trouble concentrating 0 0 0 - -  Moving slowly or fidgety/restless 1 1 1  - -  Suicidal thoughts 0 0 2 - -  PHQ-9 Score 4 4 8  - -  Difficult doing work/chores Not difficult at all - Not difficult at all - -       Nutrition & Weight - Outcomes:  Pre Biometrics - 10/28/19 1145      Pre Biometrics   Height 5' 10.75" (1.797 m)    Weight 168 lb 14.4 oz (76.6 kg)    BMI (Calculated) 23.73    Single Leg Stand 30 seconds            Nutrition:  Nutrition Therapy & Goals - 12/09/19 1302      Nutrition Therapy   Diet Low Na, HH diet    Protein (specify units) 75-80g    Fiber 30 grams    Whole Grain Foods 3 servings    Saturated Fats 12 max. grams    Fruits and Vegetables 5 servings/day    Sodium 1.5 grams      Personal Nutrition Goals   Nutrition Goal ST: add protein shake either two 10g or one 20 g; optimize by having after workoutLT: Healthy as long as possible    Comments Shredded mini wheat, cheerios. Ham or bolonga sandwiches with  fruit. Pretzles or chips. Meat with two vegetables. Airfryer. Eats fish. Discussed Pulmonary MNT and general HH eating.      Intervention Plan   Intervention Prescribe, educate and counsel regarding individualized specific dietary modifications aiming towards targeted core components such as weight, hypertension, lipid management, diabetes, heart failure and other comorbidities.;Nutrition handout(s) given to patient.    Expected Outcomes Short Term Goal: Understand basic principles of dietary content, such as calories, fat, sodium, cholesterol and nutrients.;Short Term Goal: A plan has been developed with personal nutrition goals set during dietitian appointment.;Long Term Goal: Adherence to prescribed nutrition plan.            Goals reviewed with patient; copy given to patient.

## 2020-03-29 NOTE — Progress Notes (Signed)
Pulmonary Individual Treatment Plan  Patient Details  Name: Lawrence Wells MRN: [id number] Date of Birth: Nov 11, 1955 Referring Provider:     Pulmonary Rehab from 10/28/2019 in [facility] Cardiac and Pulmonary Rehab  Referring Provider Brand Males MD      Initial Encounter Date:    Pulmonary Rehab from 10/28/2019 in [facility] Cardiac and Pulmonary Rehab  Date 10/28/19      Visit Diagnosis: ILD (interstitial lung disease) ([place])  Patient's Home Medications on Admission:  Current Outpatient Medications:  .  albuterol (PROAIR HFA) 108 (90 Base) MCG/ACT inhaler, INHALE 2 PUFFS INTO THE LUNGS EVERY 4 HOURS AS NEEDED FOR WHEEZING OR SHORTNESS OF BREATH OR COUGH, Disp: , Rfl:  .  apixaban (ELIQUIS) 5 MG TABS tablet, Take 1 tablet (5 mg total) by mouth 2 (two) times daily., Disp: 180 tablet, Rfl: 3 .  azaTHIOprine (IMURAN) 50 MG tablet, Take 2 tablets (100 mg total) by mouth daily., Disp: 180 tablet, Rfl: 0 .  diclofenac Sodium (VOLTAREN) 1 % GEL, Apply 2 g topically 3 (three) times daily as needed. (Patient taking differently: Apply 2 g topically 3 (three) times daily as needed (Pain (Arthritis)). ), Disp: 100 g, Rfl: 2 .  ezetimibe (ZETIA) 10 MG tablet, Take 1 tablet (10 mg total) by mouth daily., Disp: 90 tablet, Rfl: 3 .  fluticasone (FLONASE) 50 MCG/ACT nasal spray, Place 2 sprays into both nostrils daily. Use for 4-6 weeks then stop and use seasonally or as needed., Disp: 16 g, Rfl: 3 .  Multiple Vitamin (MULTIVITAMIN) tablet, Take 1 tablet by mouth daily., Disp: , Rfl:  .  Nintedanib (OFEV) 150 MG CAPS, Please take 1 tablet oral daily for 1 week, then resume back to 1 tablet twice daily after  that, Disp: 60 capsule, Rfl: 11 .  OXYGEN, Inhale into the lungs as needed., Disp: , Rfl:  .  pantoprazole (PROTONIX) 40 MG tablet, Take 1 tablet (40 mg total) by mouth daily., Disp: 30 tablet, Rfl: 1 .  predniSONE (DELTASONE) 10 MG tablet, 4 x 3 days, 3 x 3 days, 2 x 3 days, 1 x 3 then resume 7.5 mg,  Disp: 30 tablet, Rfl: 0 .  Tiotropium Bromide Monohydrate (SPIRIVA RESPIMAT) 2.5 MCG/ACT AERS, Inhale 2 puffs into the lungs daily., Disp: 1 g, Rfl: 5 .  valACYclovir (VALTREX) 500 MG tablet, Take 1 tablet (500 mg total) by mouth 2 (two) times daily as needed (herpes flare). For 3-7 days as needed for flare, Disp: 30 tablet, Rfl: 1  Past Medical History: Past Medical History:  Diagnosis Date  . Arthritis   . Collagen vascular disease (HCC)    Rhematoid Arthritis  . COPD (chronic obstructive pulmonary disease) ([place])   . Coronary artery disease   . Dyspnea   . Emphysema lung ([place])   . Hyperlipidemia   . Pulmonary filariasis     Tobacco Use: Social History   Tobacco Use  Smoking Status Former Smoker  . Packs/day: 1.50  . Years: 30.00  . Pack years: 45.00  . Types: Cigarettes  . Quit date: 2007  . Years since quitting: 14.6  Smokeless Tobacco Former Systems developer  . Types: Chew  . Quit date: 2007  Tobacco Comment   Dip smokeless tobacco >20-30 years    Labs: Recent Review Flowsheet Data    Labs for ITP Cardiac and Pulmonary Rehab Latest Ref Rng & Units 05/07/2017 09/02/2019   Cholestrol <200 mg/dL 259(H) 210(H)   LDLCALC mg/dL (calc) 156(H) 134(H)   HDL > OR =  40 mg/dL 76 51   Trlycerides <150 mg/dL 138 139   Hemoglobin A1c <5.7 % of total Hgb 5.5 5.7(H)       Pulmonary Assessment Scores:  Pulmonary Assessment Scores    Row Name 10/27/19 1117         ADL UCSD   ADL Phase Entry     SOB Score total 41     Rest 0     Walk 3     Stairs 4     Bath 2     Dress 2     Shop 1       CAT Score   CAT Score 15            UCSD: Self-administered rating of dyspnea associated with activities of daily living (ADLs) 6-point scale (0 = "not at all" to 5 = "maximal or unable to do because of breathlessness")  Scoring Scores range from 0 to 120.  Minimally important difference is 5 units  CAT: CAT can identify the health impairment of COPD patients and is better correlated  with disease progression.  CAT has a scoring range of zero to 40. The CAT score is classified into four groups of low (less than 10), medium (10 - 20), high (21-30) and very high (31-40) based on the impact level of disease on health status. A CAT score over 10 suggests significant symptoms.  A worsening CAT score could be explained by an exacerbation, poor medication adherence, poor inhaler technique, or progression of COPD or comorbid conditions.  CAT MCID is 2 points  mMRC: mMRC (Modified Medical Research Council) Dyspnea Scale is used to assess the degree of baseline functional disability in patients of respiratory disease due to dyspnea. No minimal important difference is established. A decrease in score of 1 point or greater is considered a positive change.   Pulmonary Function Assessment:  Pulmonary Function Assessment - 10/27/19 1118      Breath   Shortness of Breath Yes;Limiting activity           Exercise Target Goals: Exercise Program Goal: Individual exercise prescription set using results from initial 6 min walk test and THRR while considering  patient's activity barriers and safety.   Exercise Prescription Goal: Initial exercise prescription builds to 30-45 minutes a day of aerobic activity, 2-3 days per week.  Home exercise guidelines will be given to patient during program as part of exercise prescription that the participant will acknowledge.  Education: Aerobic Exercise & Resistance Training: - Gives group verbal and written instruction on the various components of exercise. Focuses on aerobic and resistive training programs and the benefits of this training and how to safely progress through these programs..   Education: Exercise & Equipment Safety: - Individual verbal instruction and demonstration of equipment use and safety with use of the equipment.   Pulmonary Rehab from 03/15/2020 in [facility] Cardiac and Pulmonary Rehab  Date 10/28/19  Educator [facility]  Instruction  Review Code 1- Verbalizes Understanding      Education: Exercise Physiology & General Exercise Guidelines: - Group verbal and written instruction with models to review the exercise physiology of the cardiovascular system and associated critical values. Provides general exercise guidelines with specific guidelines to those with heart or lung disease.    Pulmonary Rehab from 03/15/2020 in [facility] Cardiac and Pulmonary Rehab  Date 03/02/20  Educator [facility]  Instruction Review Code 1- Verbalizes Understanding      Education: Flexibility, Balance, Mind/Body Relaxation: Provides group verbal/written instruction on  the benefits of flexibility and balance training, including mind/body exercise modes such as yoga, pilates and tai chi.  Demonstration and skill practice provided.   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 HR 95 bpm     Max Ex. BP 146/72     2 Minute Post BP 134/70       Interval HR   1 Minute HR 82     2 Minute HR 89     3 Minute HR 89     4 Minute HR 95     5 Minute HR 92     6 Minute HR 93     2 Minute Post HR 67     Interval Heart Rate? Yes       Interval Oxygen   Interval Oxygen? Yes     Baseline Oxygen Saturation % 95  %     1 Minute Oxygen Saturation % 89 %     1 Minute Liters of Oxygen 0 L  Room Air     2 Minute Oxygen Saturation % 86 %     2 Minute Liters of Oxygen 0 L     3 Minute Oxygen Saturation % 82 %     3 Minute Liters of Oxygen 0 L     4 Minute Oxygen Saturation % 81 %     4 Minute Liters of Oxygen 0 L     5 Minute Oxygen Saturation % 80 %     5 Minute Liters of Oxygen 0 L     6 Minute Oxygen Saturation % 79 %     6 Minute Liters of Oxygen 0 L     2 Minute Post Oxygen Saturation % 91 %     2 Minute Post Liters of Oxygen 0 L           Oxygen Initial Assessment:  Oxygen Initial Assessment - 11/03/19 0835      Home Oxygen   Home Oxygen Device Home Concentrator;E-Tanks    Sleep Oxygen Prescription Continuous    Liters per minute 2    Home Exercise Oxygen Prescription Continuous    Liters per minute 2    Home at Rest Exercise Oxygen Prescription None    Liters per minute 0    Compliance with Home Oxygen Use Yes           Oxygen Re-Evaluation:  Oxygen  Re-Evaluation    Row Name 11/03/19 0843 11/25/19 0742 12/29/19 0758 02/09/20 0856 03/09/20 0731     Program Oxygen Prescription   Program Oxygen Prescription E-Tanks;Continuous E-Tanks;Continuous E-Tanks;Continuous E-Tanks;Continuous E-Tanks;Continuous   Liters per minute '2 3 3 3 4   ' Comments Required oxygen 2 L  while exercising Required oxygen 3 L  while exercising Required oxygen 3 L  while exercising 4Required 4 L while exercising today. Returned today post COVID uses 4-6 L in class depending on modality.     Home Oxygen   Home Oxygen Device Home Concentrator;E-Tanks Home Concentrator;E-Tanks Home Concentrator;E-Tanks Home Concentrator;E-Tanks Home Concentrator;E-Tanks   Sleep Oxygen Prescription Continuous Continuous Continuous Continuous Continuous   Liters per minute 2 2  may use 3L when worked hard during the day (a couple times a week) '2 2 4   ' Home Exercise Oxygen Prescription Continuous Continuous Continuous Continuous  Continuous   Liters per minute '2 3 3 4 5   ' Home at Rest Exercise Oxygen Prescription None -- None Continuous Continuous   Liters per minute 0 0 0 2 4   Compliance with Home Oxygen Use Yes Yes Yes No  Saman arrived without home O2 today Yes     Goals/Expected Outcomes   Short Term Goals To learn and exhibit compliance with exercise, home and travel O2 prescription;To learn and understand importance of monitoring SPO2 with pulse oximeter and demonstrate accurate use of the pulse oximeter.;To learn and understand importance of maintaining oxygen saturations>88%;To learn and demonstrate proper pursed lip breathing techniques or other breathing techniques. To learn and exhibit compliance with exercise, home and travel O2 prescription;To learn and understand importance of monitoring SPO2 with pulse oximeter and demonstrate accurate use of the pulse oximeter.;To learn and understand importance of maintaining oxygen saturations>88%;To learn and demonstrate proper pursed lip breathing techniques or other breathing techniques. To learn and exhibit compliance with exercise, home and travel O2 prescription;To learn and understand importance of monitoring SPO2 with pulse oximeter and demonstrate accurate use of the pulse oximeter.;To learn and understand importance of maintaining oxygen saturations>88%;To learn and demonstrate proper pursed lip breathing techniques or other breathing techniques. To learn and understand importance of maintaining oxygen saturations>88% To learn and understand importance of maintaining oxygen saturations>88%;To learn and understand importance of monitoring SPO2 with pulse oximeter and demonstrate accurate use of the pulse oximeter.;To learn and exhibit compliance with exercise, home and travel O2 prescription;To learn and demonstrate proper pursed lip breathing techniques or other breathing techniques.;To learn and demonstrate proper use of respiratory medications   Long  Term Goals  Exhibits compliance with exercise, home and travel O2 prescription;Verbalizes importance of monitoring SPO2 with pulse oximeter and return demonstration;Maintenance of O2 saturations>88%;Exhibits proper breathing techniques, such as pursed lip breathing or other method taught during program session Exhibits compliance with exercise, home and travel O2 prescription;Verbalizes importance of monitoring SPO2 with pulse oximeter and return demonstration;Maintenance of O2 saturations>88%;Exhibits proper breathing techniques, such as pursed lip breathing or other method taught during program session Exhibits compliance with exercise, home and travel O2 prescription;Verbalizes importance of monitoring SPO2 with pulse oximeter and return demonstration;Maintenance of O2 saturations>88%;Exhibits proper breathing techniques, such as pursed lip breathing or other method taught during program session Verbalizes importance of monitoring SPO2 with pulse oximeter and return demonstration;Maintenance of O2 saturations>88% Exhibits compliance with exercise, home and travel O2 prescription;Verbalizes importance of monitoring SPO2 with pulse oximeter and return demonstration;Maintenance of O2 saturations>88%;Exhibits proper breathing techniques, such as pursed lip breathing or other method taught during  program session;Compliance with respiratory medication;Demonstrates proper use of MDI's   Comments Reviewed PLB technique with pt.  Talked about how it works and it's importance in maintaining their exercise saturations. Reviewed PLB technique with pt.  Talked about how it works and it's importance in maintaining their exercise saturations. Nasean checks 02 at home.  He will be evaluated at [facility] for transplant later this month Noriel required increase in exercise oxygen today. reminded him to check sat while exercising and keep above 88% Aaron is better at wearing his oxygen into class.  He also got a handicap sticker to help with moving  in and out ouf buildings.  When he is on his oxygen, his sats do better, but they still drop.  He is good about using his pursed lip breathing.   Goals/Expected Outcomes Short: Become more profiecient at using PLB.   Long: Become independent at using PLB. Short: Become more profiecient at using PLB.   Long: Become independent at using PLB. Short:  continue to use PLB and monitor 02 at home Long:  maintian 02 at optimal levels Short:  continue to use PLB and monitor 02 at home Long:  maintian 02 at optimal levels Short: Continue to use PLB and wear O2  Long: Continue to stay compliant.          Oxygen Discharge (Final Oxygen Re-Evaluation):  Oxygen Re-Evaluation - 03/09/20 0731      Program Oxygen Prescription   Program Oxygen Prescription E-Tanks;Continuous    Liters per minute 4    Comments uses 4-6 L in class depending on modality.      Home Oxygen   Home Oxygen Device Home Concentrator;E-Tanks    Sleep Oxygen Prescription Continuous    Liters per minute 4    Home Exercise Oxygen Prescription Continuous    Liters per minute 5    Home at Rest Exercise Oxygen Prescription Continuous    Liters per minute 4    Compliance with Home Oxygen Use Yes      Goals/Expected Outcomes   Short Term Goals To learn and understand importance of maintaining oxygen saturations>88%;To learn and understand importance of monitoring SPO2 with pulse oximeter and demonstrate accurate use of the pulse oximeter.;To learn and exhibit compliance with exercise, home and travel O2 prescription;To learn and demonstrate proper pursed lip breathing techniques or other breathing techniques.;To learn and demonstrate proper use of respiratory medications    Long  Term Goals Exhibits compliance with exercise, home and travel O2 prescription;Verbalizes importance of monitoring SPO2 with pulse oximeter and return demonstration;Maintenance of O2 saturations>88%;Exhibits proper breathing techniques, such as pursed lip breathing  or other method taught during program session;Compliance with respiratory medication;Demonstrates proper use of MDI's    Comments Jamian is better at wearing his oxygen into class.  He also got a handicap sticker to help with moving in and out ouf buildings.  When he is on his oxygen, his sats do better, but they still drop.  He is good about using his pursed lip breathing.    Goals/Expected Outcomes Short: Continue to use PLB and wear O2  Long: Continue to stay compliant.           Initial Exercise Prescription:  Initial Exercise Prescription - 10/28/19 1100      Date of Initial Exercise RX and Referring Provider   Date 10/28/19    Referring Provider Brand Males MD      Oxygen   Oxygen Continuous    Liters 2  Treadmill   MPH 2.4    Grade 0.5    Minutes 15    METs 3      NuStep   Level 3    SPM 80    Minutes 15    METs 3      Elliptical   Level 1    Speed 3    Minutes 15    METs 3      Biostep-RELP   Level 3    SPM 50    Minutes 15    METs 3      Prescription Details   Frequency (times per week) 2    Duration Progress to 30 minutes of continuous aerobic without signs/symptoms of physical distress      Intensity   THRR 40-80% of Max Heartrate 98-138    Ratings of Perceived Exertion 11-13    Perceived Dyspnea 0-4      Progression   Progression Continue to progress workloads to maintain intensity without signs/symptoms of physical distress.      Resistance Training   Training Prescription Yes    Weight 5 lb    Reps 10-15           Perform Capillary Blood Glucose checks as needed.  Exercise Prescription Changes:  Exercise Prescription Changes    Row Name 10/28/19 1100 11/10/19 0900 11/23/19 1500 12/08/19 1100 12/09/19 0800     Response to Exercise   Blood Pressure (Admit) 130/74 110/68 104/60 128/70 --   Blood Pressure (Exercise) 146/72 124/78 134/74 152/74 --   Blood Pressure (Exit) 132/70 104/60 106/60 126/74 --   Heart Rate (Admit) 60  bpm 69 bpm 70 bpm 69 bpm --   Heart Rate (Exercise) 95 bpm 90 bpm 97 bpm 98 bpm --   Heart Rate (Exit) 62 bpm 74 bpm 74 bpm 85 bpm --   Oxygen Saturation (Admit) 95 % 91 % 98 % 97 % --   Oxygen Saturation (Exercise) 79 % 92 % 82 % 89 % --   Oxygen Saturation (Exit) 95 % 94 % 98 % 97 % --   Rating of Perceived Exertion (Exercise) '7 11 13 13 ' --   Perceived Dyspnea (Exercise) '1 3 3 2 ' --   Symptoms none SOB SOB SOB --   Comments walk test results -- -- -- --   Duration -- Continue with 30 min of aerobic exercise without signs/symptoms of physical distress. Continue with 30 min of aerobic exercise without signs/symptoms of physical distress. Continue with 30 min of aerobic exercise without signs/symptoms of physical distress. --   Intensity -- THRR unchanged THRR unchanged THRR unchanged --     Progression   Progression -- Continue to progress workloads to maintain intensity without signs/symptoms of physical distress. Continue to progress workloads to maintain intensity without signs/symptoms of physical distress. Continue to progress workloads to maintain intensity without signs/symptoms of physical distress. --   Average METs -- 3 4  4L on TM  3.68 --     Resistance Training   Training Prescription -- Yes Yes Yes --   Weight -- 5 lb 5 lb 5 lb --   Reps -- 10-15 10-15 10-15 --     Interval Training   Interval Training -- No No No --     Oxygen   Oxygen -- Continuous Continuous Continuous --   Liters -- 2-3 3-4 3-4 --     Treadmill   MPH -- 2.4 2.4 2.8 --   Grade -- 0.5 1  1 --   Minutes -- '15 15 15 ' --   METs -- 3 3.17 3.53 --     NuStep   Level -- -- -- 4 --   Minutes -- -- -- 15 --   METs -- -- -- 3.5 --     REL-XR   Level -- '3 4 6 ' --   Minutes -- '15 15 15 ' --     Biostep-RELP   Level -- -- -- 3 --   Minutes -- -- -- 15 --   METs -- -- -- 4 --     Home Exercise Plan   Plans to continue exercise at -- -- -- -- [employer] (comment)  Neurosurgeon and walking    Frequency -- -- -- -- Add 2 additional days to program exercise sessions.   Initial Home Exercises Provided -- -- -- -- 12/09/19   Row Name 12/20/19 1400 02/16/20 1000 02/28/20 1300 03/14/20 1300       Response to Exercise   Blood Pressure (Admit) 94/60 1'04/58 94/58 90/56 '    Blood Pressure (Exercise) 128/72 128/74 120/60 108/62    Blood Pressure (Exit) '92/62 98/56 90/56 ' 110/68    Heart Rate (Admit) 71 bpm 103 bpm 65 bpm 80 bpm    Heart Rate (Exercise) 98 bpm 121 bpm 94 bpm 97 bpm    Heart Rate (Exit) 74 bpm 100 bpm 91 bpm 94 bpm    Oxygen Saturation (Admit) 97 % 90 % 98 % 96 %    Oxygen Saturation (Exercise) 83 % 83 % 89 % 89 %    Oxygen Saturation (Exit) 95 % 94 % 94 % 98 %    Rating of Perceived Exertion (Exercise) '12 12 13 13    ' Perceived Dyspnea (Exercise) -- -- 2 3    Symptoms -- -- SOB SOB    Duration Continue with 30 min of aerobic exercise without signs/symptoms of physical distress. Continue with 30 min of aerobic exercise without signs/symptoms of physical distress. Continue with 30 min of aerobic exercise without signs/symptoms of physical distress. Continue with 30 min of aerobic exercise without signs/symptoms of physical distress.    Intensity THRR unchanged THRR unchanged THRR unchanged THRR unchanged      Progression   Progression Continue to progress workloads to maintain intensity without signs/symptoms of physical distress. Continue to progress workloads to maintain intensity without signs/symptoms of physical distress. Continue to progress workloads to maintain intensity without signs/symptoms of physical distress. Continue to progress workloads to maintain intensity without signs/symptoms of physical distress.    Average METs 3.25 2.35 2.7 2.4      Resistance Training   Training Prescription Yes Yes Yes Yes    Weight 5 lb 5 lb 5 lb 5 lb    Reps 10-15 10-15 10-15 10-15      Interval Training   Interval Training No No No No      Oxygen   Oxygen Continuous  Continuous Continuous Continuous    Liters 3-4 3-6 3-6 3-6      Treadmill   MPH 2.8 1.7 1.7 1.7    Grade 1 0 0 0    Minutes '15 15 15 15    ' METs 3.53 2.3 2.3 2.3      Recumbant Bike   Level -- -- 1 --    Watts -- -- 22 --    Minutes -- -- 15 --    METs -- -- 2.91 --      NuStep   Level -- --  4 --    Minutes -- -- 15 --    METs -- -- 2.5 --      REL-XR   Level 6 6 -- 6    Watts 50 50 -- 50    Minutes 15 15 -- 15    METs -- 2.4 -- --      Biostep-RELP   Level -- -- 3 --    Minutes -- -- 15 --    METs -- -- 3 --      Home Exercise Plan   Plans to continue exercise at -- -- [employer] (comment)  [employer] and walking --    Frequency -- -- Add 2 additional days to program exercise sessions. --    Initial Home Exercises Provided -- -- 12/09/19 --           Exercise Comments:  Exercise Comments    Row Name 11/03/19 0829 11/03/19 0830 12/01/19 0843 12/08/19 0750 12/16/19 2409   Exercise Comments First full day of exercise!  Patient was oriented to gym and equipment including functions, settings, policies, and procedures.  Patient's individual exercise prescription and treatment plan were reviewed.  All starting workloads were established based on the results of the 6 minute walk test done at initial orientation visit.  The plan for exercise progression was also introduced and progression will be customized based on patient's performance and goals. Dropped to 80% on TM  Placed  2l per nasal canuula oxygen on pJohn   Sats back up above 90% Today while on the TM, sats dropped to 85%. Cued PLB and decreased Workload to get sat to 88% O2 Sat  Dropping to 85% today   Oxygen increased to 4 L Adjusting oyygen while on TM for sat lower than 88%. Change did bring sats up to 93%   Row Name 02/09/20 0856           Exercise Comments Dakin required O2 increase with exercise. Up to 4L from 2 L today O2 sats were as low as 76%              Exercise Goals and Review:   Exercise Goals    Row Name 10/28/19 1144             Exercise Goals   Increase Physical Activity Yes       Intervention Provide advice, education, support and counseling about physical activity/exercise needs.;Develop an individualized exercise prescription for aerobic and resistive training based on initial evaluation findings, risk stratification, comorbidities and participant's personal goals.       Expected Outcomes Short Term: Attend rehab on a regular basis to increase amount of physical activity.;Long Term: Add in home exercise to make exercise part of routine and to increase amount of physical activity.;Long Term: Exercising regularly at least 3-5 days a week.       Increase Strength and Stamina Yes       Intervention Provide advice, education, support and counseling about physical activity/exercise needs.;Develop an individualized exercise prescription for aerobic and resistive training based on initial evaluation findings, risk stratification, comorbidities and participant's personal goals.       Expected Outcomes Short Term: Increase workloads from initial exercise prescription for resistance, speed, and METs.;Short Term: Perform resistance training exercises routinely during rehab and add in resistance training at home;Long Term: Improve cardiorespiratory fitness, muscular endurance and strength as measured by increased METs and functional capacity (6MWT)       Able to understand and use rate of  perceived exertion (RPE) scale Yes       Intervention Provide education and explanation on how to use RPE scale       Expected Outcomes Short Term: Able to use RPE daily in rehab to express subjective intensity level;Long Term:  Able to use RPE to guide intensity level when exercising independently       Able to understand and use Dyspnea scale Yes       Intervention Provide education and explanation on how to use Dyspnea scale       Expected Outcomes Short Term: Able to use Dyspnea scale daily  in rehab to express subjective sense of shortness of breath during exertion;Long Term: Able to use Dyspnea scale to guide intensity level when exercising independently       Knowledge and understanding of Target Heart Rate Range (THRR) Yes       Intervention Provide education and explanation of THRR including how the numbers were predicted and where they are located for reference       Expected Outcomes Short Term: Able to state/look up THRR;Short Term: Able to use daily as guideline for intensity in rehab;Long Term: Able to use THRR to govern intensity when exercising independently       Able to check pulse independently Yes       Intervention Provide education and demonstration on how to check pulse in carotid and radial arteries.;Review the importance of being able to check your own pulse for safety during independent exercise       Expected Outcomes Short Term: Able to explain why pulse checking is important during independent exercise;Long Term: Able to check pulse independently and accurately       Understanding of Exercise Prescription Yes       Intervention Provide education, explanation, and written materials on patient's individual exercise prescription       Expected Outcomes Long Term: Able to explain home exercise prescription to exercise independently;Short Term: Able to explain program exercise prescription              Exercise Goals Re-Evaluation :  Exercise Goals Re-Evaluation    Row Name 11/03/19 0834 11/10/19 0909 11/23/19 1535 11/25/19 0748 12/08/19 1006     Exercise Goal Re-Evaluation   Exercise Goals Review Knowledge and understanding of Target Heart Rate Range (THRR);Able to understand and use Dyspnea scale;Able to understand and use rate of perceived exertion (RPE) scale;Understanding of Exercise Prescription Increase Physical Activity;Increase Strength and Stamina;Understanding of Exercise Prescription Increase Physical Activity;Increase Strength and Stamina;Able to  understand and use rate of perceived exertion (RPE) scale;Able to understand and use Dyspnea scale;Knowledge and understanding of Target Heart Rate Range (THRR);Able to check pulse independently;Understanding of Exercise Prescription Increase Physical Activity;Increase Strength and Stamina;Able to understand and use rate of perceived exertion (RPE) scale;Able to understand and use Dyspnea scale;Knowledge and understanding of Target Heart Rate Range (THRR);Able to check pulse independently;Understanding of Exercise Prescription Increase Physical Activity;Increase Strength and Stamina;Understanding of Exercise Prescription   Comments Reviewed RPE scale, THR and program prescription with pt today.  Pt voiced understanding and was given a copy of goals to take home. Yordy is off to a good start in rehab.  He is exercising on 2-3L to maintain his oxygen saturations. He has completed his first three full days of exercise.  We will continue to monitor his progress. Demetre had to use 4L on TM due to low O2.  He did increase incline to 1%.  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 Pt will rest when he gets SOB at work because does not wear oxygen. Mavryk has been doing well in rehab.  He is now up to level 6 on the XR and his sats are doing well too.  We will continue to monitor his progression.   Expected Outcomes Short: Use RPE daily to regulate intensity. Long: Follow program prescription in THR. Short: Continue to attend rehab regularly  Long: Continue to improve stamina and maintain saturations. Short:  exercise consistently and keep O2 above 88%  Long: improve MET level Short:  exercise consistently and keep O2 above 88%  Long: improve MET level Short: Continue to monitor O2 closely  Long: Continue to improve stamina.   [place] Name 12/09/19 3832 12/20/19 1405 02/02/20 1435 02/16/20 0741 02/28/20 1313     Exercise Goal  Re-Evaluation   Exercise Goals Review Increase Physical Activity;Understanding of Exercise Prescription;Able to understand and use rate of perceived exertion (RPE) scale;Increase Strength and Stamina;Able to understand and use Dyspnea scale;Knowledge and understanding of Target Heart Rate Range (THRR);Able to check pulse independently Increase Physical Activity;Increase Strength and Stamina;Able to understand and use rate of perceived exertion (RPE) scale;Able to understand and use Dyspnea scale;Knowledge and understanding of Target Heart Rate Range (THRR);Able to check pulse independently;Understanding of Exercise Prescription -- Increase Physical Activity;Increase Strength and Stamina Increase Physical Activity;Increase Strength and Stamina;Understanding of Exercise Prescription   Comments Reviewed home exercise with pt today.  Pt plans to walk and go to [employer] for exercise.  Reviewed THR, pulse, RPE, sign and symptoms, NTG use, and when to call 911 or MD.  Also discussed weather considerations and indoor options.  Pt voiced understanding. Antonie is doing well in rehab.  He does have to use 4L on some pieces of equipment to keep sats above 88%. Out since last review Bray has been out with COVID and has had doctors appointments. He is going to continue to exercise and get back to increasing his stamina. Marice is doing well in rehab.  He has bounced right back after being out and is glad to get back to exercising again.  He was able to pick back up with most of his workloads.  We will continue to monitor his progression.   Expected Outcomes Short: Start to add in more exercise at home.  Long; Continue to improve stamina. Short:  monitor oxygen while exercising Long: continue to build stamina -- Short: attend [facility] regularly for exercise. Long: maintain a home routine independently Short: Continue to regain stamina, wear oxygen into class always  Long: Continue to exercise more at home.   [place] Name  03/09/20 0729 03/14/20 1349           Exercise Goal Re-Evaluation   Exercise Goals Review Increase Physical Activity;Increase Strength and Stamina;Understanding of Exercise Prescription Increase Physical Activity;Increase Strength and Stamina;Understanding of Exercise Prescription      Comments Maclovio is doing well in rehab. He walks with his wife evenings daily.  His strength and stamina are recovering.  Now, he is working to build strength for surgery. Oliverio attends consistently.  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      Expected Outcomes Short: Continue to exercise for stamina  Long; Continue to exercise independently Short: Continue to exercise for stamina  Long; Continue to exercise independently  Discharge Exercise Prescription (Final Exercise Prescription Changes):  Exercise Prescription Changes - 03/14/20 1300      Response to Exercise   Blood Pressure (Admit) 90/56    Blood Pressure (Exercise) 108/62    Blood Pressure (Exit) 110/68    Heart Rate (Admit) 80 bpm    Heart Rate (Exercise) 97 bpm    Heart Rate (Exit) 94 bpm    Oxygen Saturation (Admit) 96 %    Oxygen Saturation (Exercise) 89 %    Oxygen Saturation (Exit) 98 %    Rating of Perceived Exertion (Exercise) 13    Perceived Dyspnea (Exercise) 3    Symptoms SOB    Duration Continue with 30 min of aerobic exercise without signs/symptoms of physical distress.    Intensity THRR unchanged      Progression   Progression Continue to progress workloads to maintain intensity without signs/symptoms of physical distress.    Average METs 2.4      Resistance Training   Training Prescription Yes    Weight 5 lb    Reps 10-15      Interval Training   Interval Training No      Oxygen   Oxygen Continuous    Liters 3-6      Treadmill   MPH 1.7    Grade 0    Minutes 15    METs 2.3      REL-XR   Level 6    Watts 50    Minutes 15           Nutrition:  Target Goals:  Understanding of nutrition guidelines, daily intake of sodium <1534m, cholesterol <2027m calories 30% from fat and 7% or less from saturated fats, daily to have 5 or more servings of fruits and vegetables.  Education: Controlling Sodium/Reading Food Labels -Group verbal and written material supporting the discussion of sodium use in heart healthy nutrition. Review and explanation with models, verbal and written materials for utilization of the food label.   Education: General Nutrition Guidelines/Fats and Fiber: -Group instruction provided by verbal, written material, models and posters to present the general guidelines for heart healthy nutrition. Gives an explanation and review of dietary fats and fiber.   Biometrics:  Pre Biometrics - 10/28/19 1145      Pre Biometrics   Height 5' 10.75" (1.797 m)    Weight 168 lb 14.4 oz (76.6 kg)    BMI (Calculated) 23.73    Single Leg Stand 30 seconds            Nutrition Therapy Plan and Nutrition Goals:  Nutrition Therapy & Goals - 12/09/19 1302      Nutrition Therapy   Diet Low Na, HH diet    Protein (specify units) 75-80g    Fiber 30 grams    Whole Grain Foods 3 servings    Saturated Fats 12 max. grams    Fruits and Vegetables 5 servings/day    Sodium 1.5 grams      Personal Nutrition Goals   Nutrition Goal ST: add protein shake either two 10g or one 20 g; optimize by having after workoutLT: Healthy as long as possible    Comments Shredded mini wheat, cheerios. Ham or bolonga sandwiches with fruit. Pretzles or chips. Meat with two vegetables. Airfryer. Eats fish. Discussed Pulmonary MNT and general HH eating.      Intervention Plan   Intervention Prescribe, educate and counsel regarding individualized specific dietary modifications aiming towards targeted core components such as weight, hypertension, lipid management, diabetes,  heart failure and other comorbidities.;Nutrition handout(s) given to patient.    Expected Outcomes Short  Term Goal: Understand basic principles of dietary content, such as calories, fat, sodium, cholesterol and nutrients.;Short Term Goal: A plan has been developed with personal nutrition goals set during dietitian appointment.;Long Term Goal: Adherence to prescribed nutrition plan.           Nutrition Assessments:  Nutrition Assessments - 10/27/19 1118      MEDFICTS Scores   Pre Score 23           MEDIFICTS Score Key:          ?70 Need to make dietary changes          40-70 Heart Healthy Diet         ? 40 Therapeutic Level Cholesterol Diet  Nutrition Goals Re-Evaluation:  Nutrition Goals Re-Evaluation    [place] Name 02/16/20 0747             Goals   Current Weight 166 lb (75.3 kg)       Nutrition Goal Gain some weight       Comment Nickolaus has been drinking boost and other protien drinks to try to gain weight. He is drinking about three protien drinks a day on top of eating meals.       Expected Outcome Short: gain more weight. Long: be at a healthy weight for his Lung issues.              Nutrition Goals Discharge (Final Nutrition Goals Re-Evaluation):  Nutrition Goals Re-Evaluation - 02/16/20 0747      Goals   Current Weight 166 lb (75.3 kg)    Nutrition Goal Gain some weight    Comment Sarvesh has been drinking boost and other protien drinks to try to gain weight. He is drinking about three protien drinks a day on top of eating meals.    Expected Outcome Short: gain more weight. Long: be at a healthy weight for his Lung issues.           Psychosocial: Target Goals: Acknowledge presence or absence of significant depression and/or stress, maximize coping skills, provide positive support system. Participant is able to verbalize types and ability to use techniques and skills needed for reducing stress and depression.   Education: Depression - Provides group verbal and written instruction on the correlation between heart/lung disease and depressed mood, treatment options,  and the stigmas associated with seeking treatment.   Education: Sleep Hygiene -Provides group verbal and written instruction about how sleep can affect your health.  Define sleep hygiene, discuss sleep cycles and impact of sleep habits. Review good sleep hygiene tips.    Education: Stress and Anxiety: - Provides group verbal and written instruction about the health risks of elevated stress and causes of high stress.  Discuss the correlation between heart/lung disease and anxiety and treatment options. Review healthy ways to manage with stress and anxiety.   Initial Review & Psychosocial Screening:  Initial Psych Review & Screening - 10/27/19 0846      Initial Review   Current issues with Current Stress Concerns    Source of Stress Concerns Chronic Illness;Unable to participate in former interests or hobbies;Unable to perform yard/household activities    Comments Pulmonary Fibrosis and Arthritis keep him from doing everything that he wants      [place]? Yes   both have several children, grandkids, neighbors     Barriers   Psychosocial barriers  to participate in program The patient should benefit from training in stress management and relaxation.;Psychosocial barriers identified (see note)      Screening Interventions   Interventions Encouraged to exercise;To provide support and resources with identified psychosocial needs;Provide feedback about the scores to participant    Expected Outcomes Short Term goal: Utilizing psychosocial counselor, staff and physician to assist with identification of specific Stressors or current issues interfering with healing process. Setting desired goal for each stressor or current issue identified.;Long Term Goal: Stressors or current issues are controlled or eliminated.;Short Term goal: Identification and review with participant of any Quality of Life or Depression concerns found by scoring the questionnaire.;Long Term goal: The  participant improves quality of Life and PHQ9 Scores as seen by post scores and/or verbalization of changes           Quality of Life Scores:  Scores of 19 and below usually indicate a poorer quality of life in these areas.  A difference of  2-3 points is a clinically meaningful difference.  A difference of 2-3 points in the total score of the Quality of Life Index has been associated with significant improvement in overall quality of life, self-image, physical symptoms, and general health in studies assessing change in quality of life.  PHQ-9: Recent Review Flowsheet Data    Depression screen [facility] 2/9 12/08/2019 12/01/2019 10/28/2019 09/08/2019 07/19/2019   Decreased Interest 0 0 1 0 0   Down, Depressed, Hopeless 1 0 0 0 0   PHQ - 2 Score 1 0 1 0 0   Altered sleeping '1  1 2 ' - -   Tired, decreased energy '1 1 1 ' - -   Change in appetite 0 0 0 - -   Feeling bad or failure about yourself  0 1 1 - -   Trouble concentrating 0 0 0 - -   Moving slowly or fidgety/restless '1 1 1 ' - -   Suicidal thoughts 0 0 2  - -   PHQ-9 Score '4 4 8 ' - -   Difficult doing work/chores Not difficult at all - Not difficult at all - -     Interpretation of Total Score  Total Score Depression Severity:  1-4 = Minimal depression, 5-9 = Mild depression, 10-14 = Moderate depression, 15-19 = Moderately severe depression, 20-27 = Severe depression   Psychosocial Evaluation and Intervention:  Psychosocial Evaluation - 10/27/19 0855      Psychosocial Evaluation & Interventions   Interventions Stress management education;Encouraged to exercise with the program and follow exercise prescription    Comments Idris 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  He has always been an avid exerciser at [employer] prior to the pandemic last year.  He has rhuematoid arthritis all over which also limits his ability to do things and keeps him in chronic pain.  He has a strong support system in his wife.  He is coming  to the program after his doctor recommended that he should be supervised as he gets back into his exercise routine.  He wants to get back to exercise and feel better again.  He wants to be able to breathe and able to do more.    Expected Outcomes Short: Attend rehab to get back into exercise routine.  Long: Continue to improve breathing.    Continue Psychosocial Services  Follow up required by staff           Psychosocial Re-Evaluation:  Psychosocial Re-Evaluation    Row  Name 11/25/19 0744 12/29/19 0756 02/16/20 0743 03/09/20 0734       Psychosocial Re-Evaluation   Current issues with Current Sleep Concerns Current Sleep Concerns;Current Stress Concerns Current Stress Concerns;Current Sleep Concerns Current Stress Concerns    Comments Pt reports normal sleep is tossing and turning - sometimes will have coughing spells or will get too hot. Pt reports getting enough sleep at night; 8pm-6am. Collen sleeps well most of the time.  No major stress concerns. Merel understands he has severe lung issues and copes with it very well. He states that his sleep has been better recently. His wife is supportive and gets on him about exercising. Rexford continues to stay positive and deal with his lung disease.  He sleep well and feels better overall.  He is still working toward transplant.    Expected Outcomes Pt will continue to manage stress Short: continue to work on sleep patterns Long:  maintain positive outlook Short: keep exercising to keep stress at a minimum. Long: maintain a healthy mental state post [facility]. Short: Continue to exercise and compliant with exercise Long: cContinue to stay positive.    Interventions Encouraged to attend Pulmonary Rehabilitation for the exercise -- Encouraged to attend Pulmonary Rehabilitation for the exercise Encouraged to attend Pulmonary Rehabilitation for the exercise    Continue Psychosocial Services  Follow up required by staff -- Follow up required by staff --            Psychosocial Discharge (Final Psychosocial Re-Evaluation):  Psychosocial Re-Evaluation - 03/09/20 0734      Psychosocial Re-Evaluation   Current issues with Current Stress Concerns    Comments Daeton continues to stay positive and deal with his lung disease.  He sleep well and feels better overall.  He is still working toward transplant.    Expected Outcomes Short: Continue to exercise and compliant with exercise Long: cContinue to stay positive.    Interventions Encouraged to attend Pulmonary Rehabilitation for the exercise           Education: Education Goals: Education classes will be provided on a weekly basis, covering required topics. Participant will state understanding/return demonstration of topics presented.  Learning Barriers/Preferences:  Learning Barriers/Preferences - 10/27/19 0842      Learning Barriers/Preferences   Learning Barriers None    Learning Preferences None           General Pulmonary Education Topics:  Infection Prevention: - Provides verbal and written material to individual with discussion of infection control including proper hand washing and proper equipment cleaning during exercise session.   Pulmonary Rehab from 03/15/2020 in [facility] Cardiac and Pulmonary Rehab  Date 10/28/19  Educator [facility]  Instruction Review Code 1- Verbalizes Understanding      Falls Prevention: - Provides verbal and written material to individual with discussion of falls prevention and safety.   Pulmonary Rehab from 03/15/2020 in [facility] Cardiac and Pulmonary Rehab  Date 10/28/19  Educator [facility]  Instruction Review Code 1- Verbalizes Understanding      Chronic Lung Diseases: - Group verbal and written instruction to review updates, respiratory medications, advancements in procedures and treatments. Discuss use of supplemental oxygen including available portable oxygen systems, continuous and intermittent flow rates, concentrators, personal use and safety guidelines. Review  proper use of inhaler and spacers. Provide informative websites for self-education.    Pulmonary Rehab from 03/15/2020 in [facility] Cardiac and Pulmonary Rehab  Date 03/08/20  Educator [facility]  Instruction Review Code 1- [employer]  Conservation: - Provide group verbal and written instruction for methods to conserve energy, plan and organize activities. Instruct on pacing techniques, use of adaptive equipment and posture/positioning to relieve shortness of breath.   Triggers and Exacerbations: - Group verbal and written instruction to review types of environmental triggers and ways to prevent exacerbations. Discuss weather changes, air quality and the benefits of nasal washing. Review warning signs and symptoms to help prevent infections. Discuss techniques for effective airway clearance, coughing, and vibrations.   AED/CPR: - Group verbal and written instruction with the use of models to demonstrate the basic use of the AED with the basic ABC's of resuscitation.   Anatomy and Physiology of the Lungs: - Group verbal and written instruction with the use of models to provide basic lung anatomy and physiology related to function, structure and complications of lung disease.   Anatomy & Physiology of the Heart: - Group verbal and written instruction and models provide basic cardiac anatomy and physiology, with the coronary electrical and arterial systems. Review of Valvular disease and Heart Failure   Cardiac Medications: - Group verbal and written instruction to review commonly prescribed medications for heart disease. Reviews the medication, class of the drug, and side effects.   Pulmonary Rehab from 03/15/2020 in [facility] Cardiac and Pulmonary Rehab  Date 03/15/20  Educator SB  Instruction Review Code 1- Verbalizes Understanding      Other: -Provides group and verbal instruction on various topics (see comments)   Knowledge Questionnaire Score:  Knowledge  Questionnaire Score - 10/27/19 1118      Knowledge Questionnaire Score   Pre Score 15/18 Education Focus: O2 Safety and Exercise            Core Components/Risk Factors/Patient Goals at Admission:  Personal Goals and Risk Factors at Admission - 10/28/19 1145      Core Components/Risk Factors/Patient Goals on Admission    Weight Management Yes;Weight Maintenance    Intervention Weight Management: Develop a combined nutrition and exercise program designed to reach desired caloric intake, while maintaining appropriate intake of nutrient and fiber, sodium and fats, and appropriate energy expenditure required for the weight goal.;Weight Management: Provide education and appropriate resources to help participant work on and attain dietary goals.    Admit Weight 168 lb 14.4 oz (76.6 kg)    Goal Weight: Short Term 165 lb (74.8 kg)    Goal Weight: Long Term 165 lb (74.8 kg)    Expected Outcomes Short Term: Continue to assess and modify interventions until short term weight is achieved;Long Term: Adherence to nutrition and physical activity/exercise program aimed toward attainment of established weight goal;Weight Maintenance: Understanding of the daily nutrition guidelines, which includes 25-35% calories from fat, 7% or less cal from saturated fats, less than 286m cholesterol, less than 1.5gm of sodium, & 5 or more servings of fruits and vegetables daily    Improve shortness of breath with ADL's Yes    Intervention Provide education, individualized exercise plan and daily activity instruction to help decrease symptoms of SOB with activities of daily living.    Expected Outcomes Short Term: Improve cardiorespiratory fitness to achieve a reduction of symptoms when performing ADLs;Long Term: Be able to perform more ADLs without symptoms or delay the onset of symptoms    Lipids Yes    Intervention Provide education and support for participant on nutrition & aerobic/resistive exercise along with prescribed  medications to achieve LDL <761m HDL >4021m   Expected Outcomes Short Term: Participant states understanding of desired  cholesterol values and is compliant with medications prescribed. Participant is following exercise prescription and nutrition guidelines.;Long Term: Cholesterol controlled with medications as prescribed, with individualized exercise RX and with personalized nutrition plan. Value goals: LDL < 30m, HDL > 40 mg.           Education:Diabetes - Individual verbal and written instruction to review signs/symptoms of diabetes, desired ranges of glucose level fasting, after meals and with exercise. Acknowledge that pre and post exercise glucose checks will be done for 3 sessions at entry of program.   Education: Know Your Numbers and Risk Factors: -Group verbal and written instruction about important numbers in your health.  Discussion of what are risk factors and how they play a role in the disease process.  Review of Cholesterol, Blood Pressure, Diabetes, and BMI and the role they play in your overall health.   Core Components/Risk Factors/Patient Goals Review:   Goals and Risk Factor Review    Row Name 11/25/19 0747 12/29/19 0754 03/09/20 0730         Core Components/Risk Factors/Patient Goals Review   Personal Goals Review Develop more efficient breathing techniques such as purse lipped breathing and diaphragmatic breathing and practicing self-pacing with activity. Develop more efficient breathing techniques such as purse lipped breathing and diaphragmatic breathing and practicing self-pacing with activity.;Improve shortness of breath with ADL's Weight Management/Obesity;Hypertension;Lipids     Review Pt reports not having any significant health concerns. Johnnie uses oxygen as needed at home.  He can tell he has improved since he has been exercising.  He uses PLB.  He has arthritis and it takes him a little longer to get going in the morning JHaskellcontinues to work on his risk  factors.  He is dropping weight from his liquid diet.  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  He goes to [facility] next week for another look at it.  Blood pressures have been doing well.     Expected Outcomes Continue to manage health and practice PLB. Short:  use PLB to help with ADLs Long:  manage health concerns Short: Eat to maintain weight Long: Continue to monitor risk factors.            Core Components/Risk Factors/Patient Goals at Discharge (Final Review):   Goals and Risk Factor Review - 03/09/20 0730      Core Components/Risk Factors/Patient Goals Review   Personal Goals Review Weight Management/Obesity;Hypertension;Lipids    Review JSencerecontinues to work on his risk factors.  He is dropping weight from his liquid diet.  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  He goes to [facility] next week for another look at it.  Blood pressures have been doing well.    Expected Outcomes Short: Eat to maintain weight Long: Continue to monitor risk factors.           ITP Comments:  ITP Comments    Row Name 10/27/19 0912 10/28/19 1107 11/03/19 0828 11/10/19 0552 12/01/19 0845   ITP Comments Completed virtual orientation today.  EP evaluation is scheduled for Thursday 3/11 at 930am.  [facility] Completed 6MWT and gym orientation.  Initial ITP created and sent for review to Dr. MEmily Filbert Medical Director. First full day of exercise!  Patient was oriented to gym and equipment including functions, settings, policies, and procedures.  Patient's individual exercise prescription and treatment plan were reviewed.  All starting workloads were  established based on the results of the 6 minute walk test done at initial orientation visit.  The plan for exercise progression was also introduced and progression will be customized based on patient's performance and goals. 30 day chart review  completed. ITP sent to Dr Zachery Dakins Medical Director, for review,changes as needed and signature. Continue with ITP if no changes requested Today while on the TM, sats dropped to 85%. Cued PLB and decreased Workload to get sat to 88%   Row Name 12/08/19 0530 12/08/19 0750 12/09/19 1321 12/16/19 0831 01/05/20 0534   ITP Comments 30 Day review completed. Medical Director review done, changes made as directed,and approval shown by signature of Market researcher. O2 Sat  Dropping to 85% today   Oxygen increased to 4 L Completed Initial RD Eval Adjusting oyygen while on TM for sat lower than 88%. Change did bring sats up to 93% 30 Day review completed. ITP review done, changes made as directed,and approval shown by signature of  Scientist, research (life sciences).   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 Called to check on pt. He was discharged yesterday from hospital.  Pt will ned to stay out until 6/21, should return on 6/23. 30 Day review completed. Medical Director ITP review done, changes made as directed,and signed approval by Medical Director. Out with COVID and transplant workup Christapher hopes to return on Wednesday next week.   Row Name 02/09/20 3846 02/09/20 0856 02/16/20 0749 03/01/20 0612 03/29/20 0546   ITP Comments Amauri returns todayafter COVID diagnisis. Sammuel required O2 increase with exercise. Up to 4L from 2 L today O2 sats were as low as 76% Spoke to [facility] about wearing oxygen when he comes to rehab. He did not have oxygen with him and Spo2 was 75 percent. Spoke to him why it is important to keep his oxygen above 88 percent on exertion. 30 Day review completed. Medical Director ITP review done, changes made as directed, and signed approval by Medical Director. 30 Day review completed. Medical Director ITP review done, changes made as directed, and signed approval by Medical Director.    [place] Name 03/29/20 0905           ITP Comments Called to check on patient.  Spoke to his wife.  He will be starting cancer treatments on 8/23.  He has not been feeling well and would like to discharge at this time.              Comments: Discharge ITP

## 2020-03-29 NOTE — Progress Notes (Signed)
Pulmonary Individual Treatment Plan  Patient Details  Name: Lawrence Wells MRN: [id number] Date of Birth: November 01, 1955 Referring Provider:     Pulmonary Rehab from 10/28/2019 in [facility] Cardiac and Pulmonary Rehab  Referring Provider Brand Males MD      Initial Encounter Date:    Pulmonary Rehab from 10/28/2019 in [facility] Cardiac and Pulmonary Rehab  Date 10/28/19      Visit Diagnosis: ILD (interstitial lung disease) ([place])  Patient's Home Medications on Admission:  Current Outpatient Medications:  .  albuterol (PROAIR HFA) 108 (90 Base) MCG/ACT inhaler, INHALE 2 PUFFS INTO THE LUNGS EVERY 4 HOURS AS NEEDED FOR WHEEZING OR SHORTNESS OF BREATH OR COUGH, Disp: , Rfl:  .  apixaban (ELIQUIS) 5 MG TABS tablet, Take 1 tablet (5 mg total) by mouth 2 (two) times daily., Disp: 180 tablet, Rfl: 3 .  azaTHIOprine (IMURAN) 50 MG tablet, Take 2 tablets (100 mg total) by mouth daily., Disp: 180 tablet, Rfl: 0 .  diclofenac Sodium (VOLTAREN) 1 % GEL, Apply 2 g topically 3 (three) times daily as needed. (Patient taking differently: Apply 2 g topically 3 (three) times daily as needed (Pain (Arthritis)). ), Disp: 100 g, Rfl: 2 .  ezetimibe (ZETIA) 10 MG tablet, Take 1 tablet (10 mg total) by mouth daily., Disp: 90 tablet, Rfl: 3 .  fluticasone (FLONASE) 50 MCG/ACT nasal spray, Place 2 sprays into both nostrils daily. Use for 4-6 weeks then stop and use seasonally or as needed., Disp: 16 g, Rfl: 3 .  Multiple Vitamin (MULTIVITAMIN) tablet, Take 1 tablet by mouth daily., Disp: , Rfl:  .  Nintedanib (OFEV) 150 MG CAPS, Please take 1 tablet oral daily for 1 week, then resume back to 1 tablet twice daily after  that, Disp: 60 capsule, Rfl: 11 .  OXYGEN, Inhale into the lungs as needed., Disp: , Rfl:  .  pantoprazole (PROTONIX) 40 MG tablet, Take 1 tablet (40 mg total) by mouth daily., Disp: 30 tablet, Rfl: 1 .  predniSONE (DELTASONE) 10 MG tablet, 4 x 3 days, 3 x 3 days, 2 x 3 days, 1 x 3 then resume 7.5 mg,  Disp: 30 tablet, Rfl: 0 .  Tiotropium Bromide Monohydrate (SPIRIVA RESPIMAT) 2.5 MCG/ACT AERS, Inhale 2 puffs into the lungs daily., Disp: 1 g, Rfl: 5 .  valACYclovir (VALTREX) 500 MG tablet, Take 1 tablet (500 mg total) by mouth 2 (two) times daily as needed (herpes flare). For 3-7 days as needed for flare, Disp: 30 tablet, Rfl: 1  Past Medical History: Past Medical History:  Diagnosis Date  . Arthritis   . Collagen vascular disease (HCC)    Rhematoid Arthritis  . COPD (chronic obstructive pulmonary disease) ([place])   . Coronary artery disease   . Dyspnea   . Emphysema lung ([place])   . Hyperlipidemia   . Pulmonary filariasis     Tobacco Use: Social History   Tobacco Use  Smoking Status Former Smoker  . Packs/day: 1.50  . Years: 30.00  . Pack years: 45.00  . Types: Cigarettes  . Quit date: 2007  . Years since quitting: 14.6  Smokeless Tobacco Former Systems developer  . Types: Chew  . Quit date: 2007  Tobacco Comment   Dip smokeless tobacco >20-30 years    Labs: Recent Review Flowsheet Data    Labs for ITP Cardiac and Pulmonary Rehab Latest Ref Rng & Units 05/07/2017 09/02/2019   Cholestrol <200 mg/dL 259(H) 210(H)   LDLCALC mg/dL (calc) 156(H) 134(H)   HDL > OR =  40 mg/dL 76 51   Trlycerides <150 mg/dL 138 139   Hemoglobin A1c <5.7 % of total Hgb 5.5 5.7(H)       Pulmonary Assessment Scores:  Pulmonary Assessment Scores    Row Name 10/27/19 1117         ADL UCSD   ADL Phase Entry     SOB Score total 41     Rest 0     Walk 3     Stairs 4     Bath 2     Dress 2     Shop 1       CAT Score   CAT Score 15            UCSD: Self-administered rating of dyspnea associated with activities of daily living (ADLs) 6-point scale (0 = "not at all" to 5 = "maximal or unable to do because of breathlessness")  Scoring Scores range from 0 to 120.  Minimally important difference is 5 units  CAT: CAT can identify the health impairment of COPD patients and is better correlated  with disease progression.  CAT has a scoring range of zero to 40. The CAT score is classified into four groups of low (less than 10), medium (10 - 20), high (21-30) and very high (31-40) based on the impact level of disease on health status. A CAT score over 10 suggests significant symptoms.  A worsening CAT score could be explained by an exacerbation, poor medication adherence, poor inhaler technique, or progression of COPD or comorbid conditions.  CAT MCID is 2 points  mMRC: mMRC (Modified Medical Research Council) Dyspnea Scale is used to assess the degree of baseline functional disability in patients of respiratory disease due to dyspnea. No minimal important difference is established. A decrease in score of 1 point or greater is considered a positive change.   Pulmonary Function Assessment:  Pulmonary Function Assessment - 10/27/19 1118      Breath   Shortness of Breath Yes;Limiting activity           Exercise Target Goals: Exercise Program Goal: Individual exercise prescription set using results from initial 6 min walk test and THRR while considering  patient's activity barriers and safety.   Exercise Prescription Goal: Initial exercise prescription builds to 30-45 minutes a day of aerobic activity, 2-3 days per week.  Home exercise guidelines will be given to patient during program as part of exercise prescription that the participant will acknowledge.  Education: Aerobic Exercise & Resistance Training: - Gives group verbal and written instruction on the various components of exercise. Focuses on aerobic and resistive training programs and the benefits of this training and how to safely progress through these programs..   Education: Exercise & Equipment Safety: - Individual verbal instruction and demonstration of equipment use and safety with use of the equipment.   Pulmonary Rehab from 03/15/2020 in [facility] Cardiac and Pulmonary Rehab  Date 10/28/19  Educator [facility]  Instruction  Review Code 1- Verbalizes Understanding      Education: Exercise Physiology & General Exercise Guidelines: - Group verbal and written instruction with models to review the exercise physiology of the cardiovascular system and associated critical values. Provides general exercise guidelines with specific guidelines to those with heart or lung disease.    Pulmonary Rehab from 03/15/2020 in [facility] Cardiac and Pulmonary Rehab  Date 03/02/20  Educator [facility]  Instruction Review Code 1- Verbalizes Understanding      Education: Flexibility, Balance, Mind/Body Relaxation: Provides group verbal/written instruction on  the benefits of flexibility and balance training, including mind/body exercise modes such as yoga, pilates and tai chi.  Demonstration and skill practice provided.   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 HR 95 bpm     Max Ex. BP 146/72     2 Minute Post BP 134/70       Interval HR   1 Minute HR 82     2 Minute HR 89     3 Minute HR 89     4 Minute HR 95     5 Minute HR 92     6 Minute HR 93     2 Minute Post HR 67     Interval Heart Rate? Yes       Interval Oxygen   Interval Oxygen? Yes     Baseline Oxygen Saturation % 95  %     1 Minute Oxygen Saturation % 89 %     1 Minute Liters of Oxygen 0 L  Room Air     2 Minute Oxygen Saturation % 86 %     2 Minute Liters of Oxygen 0 L     3 Minute Oxygen Saturation % 82 %     3 Minute Liters of Oxygen 0 L     4 Minute Oxygen Saturation % 81 %     4 Minute Liters of Oxygen 0 L     5 Minute Oxygen Saturation % 80 %     5 Minute Liters of Oxygen 0 L     6 Minute Oxygen Saturation % 79 %     6 Minute Liters of Oxygen 0 L     2 Minute Post Oxygen Saturation % 91 %     2 Minute Post Liters of Oxygen 0 L           Oxygen Initial Assessment:  Oxygen Initial Assessment - 11/03/19 0835      Home Oxygen   Home Oxygen Device Home Concentrator;E-Tanks    Sleep Oxygen Prescription Continuous    Liters per minute 2    Home Exercise Oxygen Prescription Continuous    Liters per minute 2    Home at Rest Exercise Oxygen Prescription None    Liters per minute 0    Compliance with Home Oxygen Use Yes           Oxygen Re-Evaluation:  Oxygen  Re-Evaluation    Row Name 11/03/19 0843 11/25/19 0742 12/29/19 0758 02/09/20 0856 03/09/20 0731     Program Oxygen Prescription   Program Oxygen Prescription E-Tanks;Continuous E-Tanks;Continuous E-Tanks;Continuous E-Tanks;Continuous E-Tanks;Continuous   Liters per minute '2 3 3 3 4   ' Comments Required oxygen 2 L  while exercising Required oxygen 3 L  while exercising Required oxygen 3 L  while exercising 4Required 4 L while exercising today. Returned today post COVID uses 4-6 L in class depending on modality.     Home Oxygen   Home Oxygen Device Home Concentrator;E-Tanks Home Concentrator;E-Tanks Home Concentrator;E-Tanks Home Concentrator;E-Tanks Home Concentrator;E-Tanks   Sleep Oxygen Prescription Continuous Continuous Continuous Continuous Continuous   Liters per minute 2 2  may use 3L when worked hard during the day (a couple times a week) '2 2 4   ' Home Exercise Oxygen Prescription Continuous Continuous Continuous Continuous  Continuous   Liters per minute '2 3 3 4 5   ' Home at Rest Exercise Oxygen Prescription None -- None Continuous Continuous   Liters per minute 0 0 0 2 4   Compliance with Home Oxygen Use Yes Yes Yes No  Aeron arrived without home O2 today Yes     Goals/Expected Outcomes   Short Term Goals To learn and exhibit compliance with exercise, home and travel O2 prescription;To learn and understand importance of monitoring SPO2 with pulse oximeter and demonstrate accurate use of the pulse oximeter.;To learn and understand importance of maintaining oxygen saturations>88%;To learn and demonstrate proper pursed lip breathing techniques or other breathing techniques. To learn and exhibit compliance with exercise, home and travel O2 prescription;To learn and understand importance of monitoring SPO2 with pulse oximeter and demonstrate accurate use of the pulse oximeter.;To learn and understand importance of maintaining oxygen saturations>88%;To learn and demonstrate proper pursed lip breathing techniques or other breathing techniques. To learn and exhibit compliance with exercise, home and travel O2 prescription;To learn and understand importance of monitoring SPO2 with pulse oximeter and demonstrate accurate use of the pulse oximeter.;To learn and understand importance of maintaining oxygen saturations>88%;To learn and demonstrate proper pursed lip breathing techniques or other breathing techniques. To learn and understand importance of maintaining oxygen saturations>88% To learn and understand importance of maintaining oxygen saturations>88%;To learn and understand importance of monitoring SPO2 with pulse oximeter and demonstrate accurate use of the pulse oximeter.;To learn and exhibit compliance with exercise, home and travel O2 prescription;To learn and demonstrate proper pursed lip breathing techniques or other breathing techniques.;To learn and demonstrate proper use of respiratory medications   Long  Term Goals  Exhibits compliance with exercise, home and travel O2 prescription;Verbalizes importance of monitoring SPO2 with pulse oximeter and return demonstration;Maintenance of O2 saturations>88%;Exhibits proper breathing techniques, such as pursed lip breathing or other method taught during program session Exhibits compliance with exercise, home and travel O2 prescription;Verbalizes importance of monitoring SPO2 with pulse oximeter and return demonstration;Maintenance of O2 saturations>88%;Exhibits proper breathing techniques, such as pursed lip breathing or other method taught during program session Exhibits compliance with exercise, home and travel O2 prescription;Verbalizes importance of monitoring SPO2 with pulse oximeter and return demonstration;Maintenance of O2 saturations>88%;Exhibits proper breathing techniques, such as pursed lip breathing or other method taught during program session Verbalizes importance of monitoring SPO2 with pulse oximeter and return demonstration;Maintenance of O2 saturations>88% Exhibits compliance with exercise, home and travel O2 prescription;Verbalizes importance of monitoring SPO2 with pulse oximeter and return demonstration;Maintenance of O2 saturations>88%;Exhibits proper breathing techniques, such as pursed lip breathing or other method taught during  program session;Compliance with respiratory medication;Demonstrates proper use of MDI's   Comments Reviewed PLB technique with pt.  Talked about how it works and it's importance in maintaining their exercise saturations. Reviewed PLB technique with pt.  Talked about how it works and it's importance in maintaining their exercise saturations. Robley checks 02 at home.  He will be evaluated at [facility] for transplant later this month Mardy required increase in exercise oxygen today. reminded him to check sat while exercising and keep above 88% Abriel is better at wearing his oxygen into class.  He also got a handicap sticker to help with moving  in and out ouf buildings.  When he is on his oxygen, his sats do better, but they still drop.  He is good about using his pursed lip breathing.   Goals/Expected Outcomes Short: Become more profiecient at using PLB.   Long: Become independent at using PLB. Short: Become more profiecient at using PLB.   Long: Become independent at using PLB. Short:  continue to use PLB and monitor 02 at home Long:  maintian 02 at optimal levels Short:  continue to use PLB and monitor 02 at home Long:  maintian 02 at optimal levels Short: Continue to use PLB and wear O2  Long: Continue to stay compliant.          Oxygen Discharge (Final Oxygen Re-Evaluation):  Oxygen Re-Evaluation - 03/09/20 0731      Program Oxygen Prescription   Program Oxygen Prescription E-Tanks;Continuous    Liters per minute 4    Comments uses 4-6 L in class depending on modality.      Home Oxygen   Home Oxygen Device Home Concentrator;E-Tanks    Sleep Oxygen Prescription Continuous    Liters per minute 4    Home Exercise Oxygen Prescription Continuous    Liters per minute 5    Home at Rest Exercise Oxygen Prescription Continuous    Liters per minute 4    Compliance with Home Oxygen Use Yes      Goals/Expected Outcomes   Short Term Goals To learn and understand importance of maintaining oxygen saturations>88%;To learn and understand importance of monitoring SPO2 with pulse oximeter and demonstrate accurate use of the pulse oximeter.;To learn and exhibit compliance with exercise, home and travel O2 prescription;To learn and demonstrate proper pursed lip breathing techniques or other breathing techniques.;To learn and demonstrate proper use of respiratory medications    Long  Term Goals Exhibits compliance with exercise, home and travel O2 prescription;Verbalizes importance of monitoring SPO2 with pulse oximeter and return demonstration;Maintenance of O2 saturations>88%;Exhibits proper breathing techniques, such as pursed lip breathing  or other method taught during program session;Compliance with respiratory medication;Demonstrates proper use of MDI's    Comments Colten is better at wearing his oxygen into class.  He also got a handicap sticker to help with moving in and out ouf buildings.  When he is on his oxygen, his sats do better, but they still drop.  He is good about using his pursed lip breathing.    Goals/Expected Outcomes Short: Continue to use PLB and wear O2  Long: Continue to stay compliant.           Initial Exercise Prescription:  Initial Exercise Prescription - 10/28/19 1100      Date of Initial Exercise RX and Referring Provider   Date 10/28/19    Referring Provider Brand Males MD      Oxygen   Oxygen Continuous    Liters 2  Treadmill   MPH 2.4    Grade 0.5    Minutes 15    METs 3      NuStep   Level 3    SPM 80    Minutes 15    METs 3      Elliptical   Level 1    Speed 3    Minutes 15    METs 3      Biostep-RELP   Level 3    SPM 50    Minutes 15    METs 3      Prescription Details   Frequency (times per week) 2    Duration Progress to 30 minutes of continuous aerobic without signs/symptoms of physical distress      Intensity   THRR 40-80% of Max Heartrate 98-138    Ratings of Perceived Exertion 11-13    Perceived Dyspnea 0-4      Progression   Progression Continue to progress workloads to maintain intensity without signs/symptoms of physical distress.      Resistance Training   Training Prescription Yes    Weight 5 lb    Reps 10-15           Perform Capillary Blood Glucose checks as needed.  Exercise Prescription Changes:  Exercise Prescription Changes    Row Name 10/28/19 1100 11/10/19 0900 11/23/19 1500 12/08/19 1100 12/09/19 0800     Response to Exercise   Blood Pressure (Admit) 130/74 110/68 104/60 128/70 --   Blood Pressure (Exercise) 146/72 124/78 134/74 152/74 --   Blood Pressure (Exit) 132/70 104/60 106/60 126/74 --   Heart Rate (Admit) 60  bpm 69 bpm 70 bpm 69 bpm --   Heart Rate (Exercise) 95 bpm 90 bpm 97 bpm 98 bpm --   Heart Rate (Exit) 62 bpm 74 bpm 74 bpm 85 bpm --   Oxygen Saturation (Admit) 95 % 91 % 98 % 97 % --   Oxygen Saturation (Exercise) 79 % 92 % 82 % 89 % --   Oxygen Saturation (Exit) 95 % 94 % 98 % 97 % --   Rating of Perceived Exertion (Exercise) '7 11 13 13 ' --   Perceived Dyspnea (Exercise) '1 3 3 2 ' --   Symptoms none SOB SOB SOB --   Comments walk test results -- -- -- --   Duration -- Continue with 30 min of aerobic exercise without signs/symptoms of physical distress. Continue with 30 min of aerobic exercise without signs/symptoms of physical distress. Continue with 30 min of aerobic exercise without signs/symptoms of physical distress. --   Intensity -- THRR unchanged THRR unchanged THRR unchanged --     Progression   Progression -- Continue to progress workloads to maintain intensity without signs/symptoms of physical distress. Continue to progress workloads to maintain intensity without signs/symptoms of physical distress. Continue to progress workloads to maintain intensity without signs/symptoms of physical distress. --   Average METs -- 3 4  4L on TM  3.68 --     Resistance Training   Training Prescription -- Yes Yes Yes --   Weight -- 5 lb 5 lb 5 lb --   Reps -- 10-15 10-15 10-15 --     Interval Training   Interval Training -- No No No --     Oxygen   Oxygen -- Continuous Continuous Continuous --   Liters -- 2-3 3-4 3-4 --     Treadmill   MPH -- 2.4 2.4 2.8 --   Grade -- 0.5 1  1 --   Minutes -- '15 15 15 ' --   METs -- 3 3.17 3.53 --     NuStep   Level -- -- -- 4 --   Minutes -- -- -- 15 --   METs -- -- -- 3.5 --     REL-XR   Level -- '3 4 6 ' --   Minutes -- '15 15 15 ' --     Biostep-RELP   Level -- -- -- 3 --   Minutes -- -- -- 15 --   METs -- -- -- 4 --     Home Exercise Plan   Plans to continue exercise at -- -- -- -- [employer] (comment)  Neurosurgeon and walking    Frequency -- -- -- -- Add 2 additional days to program exercise sessions.   Initial Home Exercises Provided -- -- -- -- 12/09/19   Row Name 12/20/19 1400 02/16/20 1000 02/28/20 1300 03/14/20 1300       Response to Exercise   Blood Pressure (Admit) 94/60 1'04/58 94/58 90/56 '    Blood Pressure (Exercise) 128/72 128/74 120/60 108/62    Blood Pressure (Exit) '92/62 98/56 90/56 ' 110/68    Heart Rate (Admit) 71 bpm 103 bpm 65 bpm 80 bpm    Heart Rate (Exercise) 98 bpm 121 bpm 94 bpm 97 bpm    Heart Rate (Exit) 74 bpm 100 bpm 91 bpm 94 bpm    Oxygen Saturation (Admit) 97 % 90 % 98 % 96 %    Oxygen Saturation (Exercise) 83 % 83 % 89 % 89 %    Oxygen Saturation (Exit) 95 % 94 % 94 % 98 %    Rating of Perceived Exertion (Exercise) '12 12 13 13    ' Perceived Dyspnea (Exercise) -- -- 2 3    Symptoms -- -- SOB SOB    Duration Continue with 30 min of aerobic exercise without signs/symptoms of physical distress. Continue with 30 min of aerobic exercise without signs/symptoms of physical distress. Continue with 30 min of aerobic exercise without signs/symptoms of physical distress. Continue with 30 min of aerobic exercise without signs/symptoms of physical distress.    Intensity THRR unchanged THRR unchanged THRR unchanged THRR unchanged      Progression   Progression Continue to progress workloads to maintain intensity without signs/symptoms of physical distress. Continue to progress workloads to maintain intensity without signs/symptoms of physical distress. Continue to progress workloads to maintain intensity without signs/symptoms of physical distress. Continue to progress workloads to maintain intensity without signs/symptoms of physical distress.    Average METs 3.25 2.35 2.7 2.4      Resistance Training   Training Prescription Yes Yes Yes Yes    Weight 5 lb 5 lb 5 lb 5 lb    Reps 10-15 10-15 10-15 10-15      Interval Training   Interval Training No No No No      Oxygen   Oxygen Continuous  Continuous Continuous Continuous    Liters 3-4 3-6 3-6 3-6      Treadmill   MPH 2.8 1.7 1.7 1.7    Grade 1 0 0 0    Minutes '15 15 15 15    ' METs 3.53 2.3 2.3 2.3      Recumbant Bike   Level -- -- 1 --    Watts -- -- 22 --    Minutes -- -- 15 --    METs -- -- 2.91 --      NuStep   Level -- --  4 --    Minutes -- -- 15 --    METs -- -- 2.5 --      REL-XR   Level 6 6 -- 6    Watts 50 50 -- 50    Minutes 15 15 -- 15    METs -- 2.4 -- --      Biostep-RELP   Level -- -- 3 --    Minutes -- -- 15 --    METs -- -- 3 --      Home Exercise Plan   Plans to continue exercise at -- -- [employer] (comment)  [employer] and walking --    Frequency -- -- Add 2 additional days to program exercise sessions. --    Initial Home Exercises Provided -- -- 12/09/19 --           Exercise Comments:  Exercise Comments    Row Name 11/03/19 0829 11/03/19 0830 12/01/19 0843 12/08/19 0750 12/16/19 4098   Exercise Comments First full day of exercise!  Patient was oriented to gym and equipment including functions, settings, policies, and procedures.  Patient's individual exercise prescription and treatment plan were reviewed.  All starting workloads were established based on the results of the 6 minute walk test done at initial orientation visit.  The plan for exercise progression was also introduced and progression will be customized based on patient's performance and goals. Dropped to 80% on TM  Placed  2l per nasal canuula oxygen on pJohn   Sats back up above 90% Today while on the TM, sats dropped to 85%. Cued PLB and decreased Workload to get sat to 88% O2 Sat  Dropping to 85% today   Oxygen increased to 4 L Adjusting oyygen while on TM for sat lower than 88%. Change did bring sats up to 93%   Row Name 02/09/20 0856           Exercise Comments [place] required O2 increase with exercise. Up to 4L from 2 L today O2 sats were as low as 76%              Exercise Goals and Review:   Exercise Goals    Row Name 10/28/19 1144             Exercise Goals   Increase Physical Activity Yes       Intervention Provide advice, education, support and counseling about physical activity/exercise needs.;Develop an individualized exercise prescription for aerobic and resistive training based on initial evaluation findings, risk stratification, comorbidities and participant's personal goals.       Expected Outcomes Short Term: Attend rehab on a regular basis to increase amount of physical activity.;Long Term: Add in home exercise to make exercise part of routine and to increase amount of physical activity.;Long Term: Exercising regularly at least 3-5 days a week.       Increase Strength and Stamina Yes       Intervention Provide advice, education, support and counseling about physical activity/exercise needs.;Develop an individualized exercise prescription for aerobic and resistive training based on initial evaluation findings, risk stratification, comorbidities and participant's personal goals.       Expected Outcomes Short Term: Increase workloads from initial exercise prescription for resistance, speed, and METs.;Short Term: Perform resistance training exercises routinely during rehab and add in resistance training at home;Long Term: Improve cardiorespiratory fitness, muscular endurance and strength as measured by increased METs and functional capacity (6MWT)       Able to understand and use rate of  perceived exertion (RPE) scale Yes       Intervention Provide education and explanation on how to use RPE scale       Expected Outcomes Short Term: Able to use RPE daily in rehab to express subjective intensity level;Long Term:  Able to use RPE to guide intensity level when exercising independently       Able to understand and use Dyspnea scale Yes       Intervention Provide education and explanation on how to use Dyspnea scale       Expected Outcomes Short Term: Able to use Dyspnea scale daily  in rehab to express subjective sense of shortness of breath during exertion;Long Term: Able to use Dyspnea scale to guide intensity level when exercising independently       Knowledge and understanding of Target Heart Rate Range (THRR) Yes       Intervention Provide education and explanation of THRR including how the numbers were predicted and where they are located for reference       Expected Outcomes Short Term: Able to state/look up THRR;Short Term: Able to use daily as guideline for intensity in rehab;Long Term: Able to use THRR to govern intensity when exercising independently       Able to check pulse independently Yes       Intervention Provide education and demonstration on how to check pulse in carotid and radial arteries.;Review the importance of being able to check your own pulse for safety during independent exercise       Expected Outcomes Short Term: Able to explain why pulse checking is important during independent exercise;Long Term: Able to check pulse independently and accurately       Understanding of Exercise Prescription Yes       Intervention Provide education, explanation, and written materials on patient's individual exercise prescription       Expected Outcomes Long Term: Able to explain home exercise prescription to exercise independently;Short Term: Able to explain program exercise prescription              Exercise Goals Re-Evaluation :  Exercise Goals Re-Evaluation    Row Name 11/03/19 0834 11/10/19 0909 11/23/19 1535 11/25/19 0748 12/08/19 1006     Exercise Goal Re-Evaluation   Exercise Goals Review Knowledge and understanding of Target Heart Rate Range (THRR);Able to understand and use Dyspnea scale;Able to understand and use rate of perceived exertion (RPE) scale;Understanding of Exercise Prescription Increase Physical Activity;Increase Strength and Stamina;Understanding of Exercise Prescription Increase Physical Activity;Increase Strength and Stamina;Able to  understand and use rate of perceived exertion (RPE) scale;Able to understand and use Dyspnea scale;Knowledge and understanding of Target Heart Rate Range (THRR);Able to check pulse independently;Understanding of Exercise Prescription Increase Physical Activity;Increase Strength and Stamina;Able to understand and use rate of perceived exertion (RPE) scale;Able to understand and use Dyspnea scale;Knowledge and understanding of Target Heart Rate Range (THRR);Able to check pulse independently;Understanding of Exercise Prescription Increase Physical Activity;Increase Strength and Stamina;Understanding of Exercise Prescription   Comments Reviewed RPE scale, THR and program prescription with pt today.  Pt voiced understanding and was given a copy of goals to take home. Mcclain is off to a good start in rehab.  He is exercising on 2-3L to maintain his oxygen saturations. He has completed his first three full days of exercise.  We will continue to monitor his progress. Lestat had to use 4L on TM due to low O2.  He did increase incline to 1%.  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 Pt will rest when he gets SOB at work because does not wear oxygen. Amer has been doing well in rehab.  He is now up to level 6 on the XR and his sats are doing well too.  We will continue to monitor his progression.   Expected Outcomes Short: Use RPE daily to regulate intensity. Long: Follow program prescription in THR. Short: Continue to attend rehab regularly  Long: Continue to improve stamina and maintain saturations. Short:  exercise consistently and keep O2 above 88%  Long: improve MET level Short:  exercise consistently and keep O2 above 88%  Long: improve MET level Short: Continue to monitor O2 closely  Long: Continue to improve stamina.   [place] Name 12/09/19 5852 12/20/19 1405 02/02/20 1435 02/16/20 0741 02/28/20 1313     Exercise Goal  Re-Evaluation   Exercise Goals Review Increase Physical Activity;Understanding of Exercise Prescription;Able to understand and use rate of perceived exertion (RPE) scale;Increase Strength and Stamina;Able to understand and use Dyspnea scale;Knowledge and understanding of Target Heart Rate Range (THRR);Able to check pulse independently Increase Physical Activity;Increase Strength and Stamina;Able to understand and use rate of perceived exertion (RPE) scale;Able to understand and use Dyspnea scale;Knowledge and understanding of Target Heart Rate Range (THRR);Able to check pulse independently;Understanding of Exercise Prescription -- Increase Physical Activity;Increase Strength and Stamina Increase Physical Activity;Increase Strength and Stamina;Understanding of Exercise Prescription   Comments Reviewed home exercise with pt today.  Pt plans to walk and go to [employer] for exercise.  Reviewed THR, pulse, RPE, sign and symptoms, NTG use, and when to call 911 or MD.  Also discussed weather considerations and indoor options.  Pt voiced understanding. Kaston is doing well in rehab.  He does have to use 4L on some pieces of equipment to keep sats above 88%. Out since last review Blanca has been out with COVID and has had doctors appointments. He is going to continue to exercise and get back to increasing his stamina. Mathew is doing well in rehab.  He has bounced right back after being out and is glad to get back to exercising again.  He was able to pick back up with most of his workloads.  We will continue to monitor his progression.   Expected Outcomes Short: Start to add in more exercise at home.  Long; Continue to improve stamina. Short:  monitor oxygen while exercising Long: continue to build stamina -- Short: attend [facility] regularly for exercise. Long: maintain a home routine independently Short: Continue to regain stamina, wear oxygen into class always  Long: Continue to exercise more at home.   [place] Name  03/09/20 0729 03/14/20 1349           Exercise Goal Re-Evaluation   Exercise Goals Review Increase Physical Activity;Increase Strength and Stamina;Understanding of Exercise Prescription Increase Physical Activity;Increase Strength and Stamina;Understanding of Exercise Prescription      Comments Korben is doing well in rehab. He walks with his wife evenings daily.  His strength and stamina are recovering.  Now, he is working to build strength for surgery. Mutasim attends consistently.  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      Expected Outcomes Short: Continue to exercise for stamina  Long; Continue to exercise independently Short: Continue to exercise for stamina  Long; Continue to exercise independently  Discharge Exercise Prescription (Final Exercise Prescription Changes):  Exercise Prescription Changes - 03/14/20 1300      Response to Exercise   Blood Pressure (Admit) 90/56    Blood Pressure (Exercise) 108/62    Blood Pressure (Exit) 110/68    Heart Rate (Admit) 80 bpm    Heart Rate (Exercise) 97 bpm    Heart Rate (Exit) 94 bpm    Oxygen Saturation (Admit) 96 %    Oxygen Saturation (Exercise) 89 %    Oxygen Saturation (Exit) 98 %    Rating of Perceived Exertion (Exercise) 13    Perceived Dyspnea (Exercise) 3    Symptoms SOB    Duration Continue with 30 min of aerobic exercise without signs/symptoms of physical distress.    Intensity THRR unchanged      Progression   Progression Continue to progress workloads to maintain intensity without signs/symptoms of physical distress.    Average METs 2.4      Resistance Training   Training Prescription Yes    Weight 5 lb    Reps 10-15      Interval Training   Interval Training No      Oxygen   Oxygen Continuous    Liters 3-6      Treadmill   MPH 1.7    Grade 0    Minutes 15    METs 2.3      REL-XR   Level 6    Watts 50    Minutes 15           Nutrition:  Target Goals:  Understanding of nutrition guidelines, daily intake of sodium <151m, cholesterol <2082m calories 30% from fat and 7% or less from saturated fats, daily to have 5 or more servings of fruits and vegetables.  Education: Controlling Sodium/Reading Food Labels -Group verbal and written material supporting the discussion of sodium use in heart healthy nutrition. Review and explanation with models, verbal and written materials for utilization of the food label.   Education: General Nutrition Guidelines/Fats and Fiber: -Group instruction provided by verbal, written material, models and posters to present the general guidelines for heart healthy nutrition. Gives an explanation and review of dietary fats and fiber.   Biometrics:  Pre Biometrics - 10/28/19 1145      Pre Biometrics   Height 5' 10.75" (1.797 m)    Weight 168 lb 14.4 oz (76.6 kg)    BMI (Calculated) 23.73    Single Leg Stand 30 seconds            Nutrition Therapy Plan and Nutrition Goals:  Nutrition Therapy & Goals - 12/09/19 1302      Nutrition Therapy   Diet Low Na, HH diet    Protein (specify units) 75-80g    Fiber 30 grams    Whole Grain Foods 3 servings    Saturated Fats 12 max. grams    Fruits and Vegetables 5 servings/day    Sodium 1.5 grams      Personal Nutrition Goals   Nutrition Goal ST: add protein shake either two 10g or one 20 g; optimize by having after workoutLT: Healthy as long as possible    Comments Shredded mini wheat, cheerios. Ham or bolonga sandwiches with fruit. Pretzles or chips. Meat with two vegetables. Airfryer. Eats fish. Discussed Pulmonary MNT and general HH eating.      Intervention Plan   Intervention Prescribe, educate and counsel regarding individualized specific dietary modifications aiming towards targeted core components such as weight, hypertension, lipid management, diabetes,  heart failure and other comorbidities.;Nutrition handout(s) given to patient.    Expected Outcomes Short  Term Goal: Understand basic principles of dietary content, such as calories, fat, sodium, cholesterol and nutrients.;Short Term Goal: A plan has been developed with personal nutrition goals set during dietitian appointment.;Long Term Goal: Adherence to prescribed nutrition plan.           Nutrition Assessments:  Nutrition Assessments - 10/27/19 1118      MEDFICTS Scores   Pre Score 23           MEDIFICTS Score Key:          ?70 Need to make dietary changes          40-70 Heart Healthy Diet         ? 40 Therapeutic Level Cholesterol Diet  Nutrition Goals Re-Evaluation:  Nutrition Goals Re-Evaluation    [place] Name 02/16/20 0747             Goals   Current Weight 166 lb (75.3 kg)       Nutrition Goal Gain some weight       Comment Oreste has been drinking boost and other protien drinks to try to gain weight. He is drinking about three protien drinks a day on top of eating meals.       Expected Outcome Short: gain more weight. Long: be at a healthy weight for his Lung issues.              Nutrition Goals Discharge (Final Nutrition Goals Re-Evaluation):  Nutrition Goals Re-Evaluation - 02/16/20 0747      Goals   Current Weight 166 lb (75.3 kg)    Nutrition Goal Gain some weight    Comment Iley has been drinking boost and other protien drinks to try to gain weight. He is drinking about three protien drinks a day on top of eating meals.    Expected Outcome Short: gain more weight. Long: be at a healthy weight for his Lung issues.           Psychosocial: Target Goals: Acknowledge presence or absence of significant depression and/or stress, maximize coping skills, provide positive support system. Participant is able to verbalize types and ability to use techniques and skills needed for reducing stress and depression.   Education: Depression - Provides group verbal and written instruction on the correlation between heart/lung disease and depressed mood, treatment options,  and the stigmas associated with seeking treatment.   Education: Sleep Hygiene -Provides group verbal and written instruction about how sleep can affect your health.  Define sleep hygiene, discuss sleep cycles and impact of sleep habits. Review good sleep hygiene tips.    Education: Stress and Anxiety: - Provides group verbal and written instruction about the health risks of elevated stress and causes of high stress.  Discuss the correlation between heart/lung disease and anxiety and treatment options. Review healthy ways to manage with stress and anxiety.   Initial Review & Psychosocial Screening:  Initial Psych Review & Screening - 10/27/19 0846      Initial Review   Current issues with Current Stress Concerns    Source of Stress Concerns Chronic Illness;Unable to participate in former interests or hobbies;Unable to perform yard/household activities    Comments Pulmonary Fibrosis and Arthritis keep him from doing everything that he wants      [place]? Yes   both have several children, grandkids, neighbors     Barriers   Psychosocial barriers  to participate in program The patient should benefit from training in stress management and relaxation.;Psychosocial barriers identified (see note)      Screening Interventions   Interventions Encouraged to exercise;To provide support and resources with identified psychosocial needs;Provide feedback about the scores to participant    Expected Outcomes Short Term goal: Utilizing psychosocial counselor, staff and physician to assist with identification of specific Stressors or current issues interfering with healing process. Setting desired goal for each stressor or current issue identified.;Long Term Goal: Stressors or current issues are controlled or eliminated.;Short Term goal: Identification and review with participant of any Quality of Life or Depression concerns found by scoring the questionnaire.;Long Term goal: The  participant improves quality of Life and PHQ9 Scores as seen by post scores and/or verbalization of changes           Quality of Life Scores:  Scores of 19 and below usually indicate a poorer quality of life in these areas.  A difference of  2-3 points is a clinically meaningful difference.  A difference of 2-3 points in the total score of the Quality of Life Index has been associated with significant improvement in overall quality of life, self-image, physical symptoms, and general health in studies assessing change in quality of life.  PHQ-9: Recent Review Flowsheet Data    Depression screen [facility] 2/9 12/08/2019 12/01/2019 10/28/2019 09/08/2019 07/19/2019   Decreased Interest 0 0 1 0 0   Down, Depressed, Hopeless 1 0 0 0 0   PHQ - 2 Score 1 0 1 0 0   Altered sleeping '1  1 2 ' - -   Tired, decreased energy '1 1 1 ' - -   Change in appetite 0 0 0 - -   Feeling bad or failure about yourself  0 1 1 - -   Trouble concentrating 0 0 0 - -   Moving slowly or fidgety/restless '1 1 1 ' - -   Suicidal thoughts 0 0 2  - -   PHQ-9 Score '4 4 8 ' - -   Difficult doing work/chores Not difficult at all - Not difficult at all - -     Interpretation of Total Score  Total Score Depression Severity:  1-4 = Minimal depression, 5-9 = Mild depression, 10-14 = Moderate depression, 15-19 = Moderately severe depression, 20-27 = Severe depression   Psychosocial Evaluation and Intervention:  Psychosocial Evaluation - 10/27/19 0855      Psychosocial Evaluation & Interventions   Interventions Stress management education;Encouraged to exercise with the program and follow exercise prescription    Comments Lester 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  He has always been an avid exerciser at [employer] prior to the pandemic last year.  He has rhuematoid arthritis all over which also limits his ability to do things and keeps him in chronic pain.  He has a strong support system in his wife.  He is coming  to the program after his doctor recommended that he should be supervised as he gets back into his exercise routine.  He wants to get back to exercise and feel better again.  He wants to be able to breathe and able to do more.    Expected Outcomes Short: Attend rehab to get back into exercise routine.  Long: Continue to improve breathing.    Continue Psychosocial Services  Follow up required by staff           Psychosocial Re-Evaluation:  Psychosocial Re-Evaluation    Row  Name 11/25/19 0744 12/29/19 0756 02/16/20 0743 03/09/20 0734       Psychosocial Re-Evaluation   Current issues with Current Sleep Concerns Current Sleep Concerns;Current Stress Concerns Current Stress Concerns;Current Sleep Concerns Current Stress Concerns    Comments Pt reports normal sleep is tossing and turning - sometimes will have coughing spells or will get too hot. Pt reports getting enough sleep at night; 8pm-6am. Cordarro sleeps well most of the time.  No major stress concerns. Kier understands he has severe lung issues and copes with it very well. He states that his sleep has been better recently. His wife is supportive and gets on him about exercising. Korry continues to stay positive and deal with his lung disease.  He sleep well and feels better overall.  He is still working toward transplant.    Expected Outcomes Pt will continue to manage stress Short: continue to work on sleep patterns Long:  maintain positive outlook Short: keep exercising to keep stress at a minimum. Long: maintain a healthy mental state post [facility]. Short: Continue to exercise and compliant with exercise Long: cContinue to stay positive.    Interventions Encouraged to attend Pulmonary Rehabilitation for the exercise -- Encouraged to attend Pulmonary Rehabilitation for the exercise Encouraged to attend Pulmonary Rehabilitation for the exercise    Continue Psychosocial Services  Follow up required by staff -- Follow up required by staff --            Psychosocial Discharge (Final Psychosocial Re-Evaluation):  Psychosocial Re-Evaluation - 03/09/20 0734      Psychosocial Re-Evaluation   Current issues with Current Stress Concerns    Comments Ivar continues to stay positive and deal with his lung disease.  He sleep well and feels better overall.  He is still working toward transplant.    Expected Outcomes Short: Continue to exercise and compliant with exercise Long: cContinue to stay positive.    Interventions Encouraged to attend Pulmonary Rehabilitation for the exercise           Education: Education Goals: Education classes will be provided on a weekly basis, covering required topics. Participant will state understanding/return demonstration of topics presented.  Learning Barriers/Preferences:  Learning Barriers/Preferences - 10/27/19 0842      Learning Barriers/Preferences   Learning Barriers None    Learning Preferences None           General Pulmonary Education Topics:  Infection Prevention: - Provides verbal and written material to individual with discussion of infection control including proper hand washing and proper equipment cleaning during exercise session.   Pulmonary Rehab from 03/15/2020 in [facility] Re[facility]  Date 10/28/19  Educator [facility]  Instruction Review Code 1- Verbalizes Understanding      Falls Prevention: - Provides verbal and written material to individual with discussion of falls prevention and safety.   Pulmonary Rehab from 03/15/2020 in [facility] Cardiac and Pulmonary Rehab  Date 10/28/19  Educator [facility]  Instruction Review Code 1- Verbalizes Understanding      Chronic Lung Diseases: - Group verbal and written instruction to review updates, respiratory medications, advancements in procedures and treatments. Discuss use of supplemental oxygen including available portable oxygen systems, continuous and intermittent flow rates, concentrators, personal use and safety guidelines. Review  proper use of inhaler and spacers. Provide informative websites for self-education.    Pulmonary Rehab from 03/15/2020 in [facility] Cardiac and Pulmonary Rehab  Date 03/08/20  Educator jh  Instruction Review Code 1- [employer]  Conservation: - Provide group verbal and written instruction for methods to conserve energy, plan and organize activities. Instruct on pacing techniques, use of adaptive equipment and posture/positioning to relieve shortness of breath.   Triggers and Exacerbations: - Group verbal and written instruction to review types of environmental triggers and ways to prevent exacerbations. Discuss weather changes, air quality and the benefits of nasal washing. Review warning signs and symptoms to help prevent infections. Discuss techniques for effective airway clearance, coughing, and vibrations.   AED/CPR: - Group verbal and written instruction with the use of models to demonstrate the basic use of the AED with the basic ABC's of resuscitation.   Anatomy and Physiology of the Lungs: - Group verbal and written instruction with the use of models to provide basic lung anatomy and physiology related to function, structure and complications of lung disease.   Anatomy & Physiology of the Heart: - Group verbal and written instruction and models provide basic cardiac anatomy and physiology, with the coronary electrical and arterial systems. Review of Valvular disease and Heart Failure   Cardiac Medications: - Group verbal and written instruction to review commonly prescribed medications for heart disease. Reviews the medication, class of the drug, and side effects.   Pulmonary Rehab from 03/15/2020 in [facility] Cardiac and Pulmonary Rehab  Date 03/15/20  Educator SB  Instruction Review Code 1- Verbalizes Understanding      Other: -Provides group and verbal instruction on various topics (see comments)   Knowledge Questionnaire Score:  Knowledge  Questionnaire Score - 10/27/19 1118      Knowledge Questionnaire Score   Pre Score 15/18 Education Focus: O2 Safety and Exercise            Core Components/Risk Factors/Patient Goals at Admission:  Personal Goals and Risk Factors at Admission - 10/28/19 1145      Core Components/Risk Factors/Patient Goals on Admission    Weight Management Yes;Weight Maintenance    Intervention Weight Management: Develop a combined nutrition and exercise program designed to reach desired caloric intake, while maintaining appropriate intake of nutrient and fiber, sodium and fats, and appropriate energy expenditure required for the weight goal.;Weight Management: Provide education and appropriate resources to help participant work on and attain dietary goals.    Admit Weight 168 lb 14.4 oz (76.6 kg)    Goal Weight: Short Term 165 lb (74.8 kg)    Goal Weight: Long Term 165 lb (74.8 kg)    Expected Outcomes Short Term: Continue to assess and modify interventions until short term weight is achieved;Long Term: Adherence to nutrition and physical activity/exercise program aimed toward attainment of established weight goal;Weight Maintenance: Understanding of the daily nutrition guidelines, which includes 25-35% calories from fat, 7% or less cal from saturated fats, less than 260m cholesterol, less than 1.5gm of sodium, & 5 or more servings of fruits and vegetables daily    Improve shortness of breath with ADL's Yes    Intervention Provide education, individualized exercise plan and daily activity instruction to help decrease symptoms of SOB with activities of daily living.    Expected Outcomes Short Term: Improve cardiorespiratory fitness to achieve a reduction of symptoms when performing ADLs;Long Term: Be able to perform more ADLs without symptoms or delay the onset of symptoms    Lipids Yes    Intervention Provide education and support for participant on nutrition & aerobic/resistive exercise along with prescribed  medications to achieve LDL <772m HDL >4050m   Expected Outcomes Short Term: Participant states understanding of desired  cholesterol values and is compliant with medications prescribed. Participant is following exercise prescription and nutrition guidelines.;Long Term: Cholesterol controlled with medications as prescribed, with individualized exercise RX and with personalized nutrition plan. Value goals: LDL < 102m, HDL > 40 mg.           Education:Diabetes - Individual verbal and written instruction to review signs/symptoms of diabetes, desired ranges of glucose level fasting, after meals and with exercise. Acknowledge that pre and post exercise glucose checks will be done for 3 sessions at entry of program.   Education: Know Your Numbers and Risk Factors: -Group verbal and written instruction about important numbers in your health.  Discussion of what are risk factors and how they play a role in the disease process.  Review of Cholesterol, Blood Pressure, Diabetes, and BMI and the role they play in your overall health.   Core Components/Risk Factors/Patient Goals Review:   Goals and Risk Factor Review    Row Name 11/25/19 0747 12/29/19 0754 03/09/20 0730         Core Components/Risk Factors/Patient Goals Review   Personal Goals Review Develop more efficient breathing techniques such as purse lipped breathing and diaphragmatic breathing and practicing self-pacing with activity. Develop more efficient breathing techniques such as purse lipped breathing and diaphragmatic breathing and practicing self-pacing with activity.;Improve shortness of breath with ADL's Weight Management/Obesity;Hypertension;Lipids     Review Pt reports not having any significant health concerns. Eura uses oxygen as needed at home.  He can tell he has improved since he has been exercising.  He uses PLB.  He has arthritis and it takes him a little longer to get going in the morning JAlieucontinues to work on his risk  factors.  He is dropping weight from his liquid diet.  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  He goes to [facility] next week for another look at it.  Blood pressures have been doing well.     Expected Outcomes Continue to manage health and practice PLB. Short:  use PLB to help with ADLs Long:  manage health concerns Short: Eat to maintain weight Long: Continue to monitor risk factors.            Core Components/Risk Factors/Patient Goals at Discharge (Final Review):   Goals and Risk Factor Review - 03/09/20 0730      Core Components/Risk Factors/Patient Goals Review   Personal Goals Review Weight Management/Obesity;Hypertension;Lipids    Review JClinecontinues to work on his risk factors.  He is dropping weight from his liquid diet.  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  He goes to [facility] next week for another look at it.  Blood pressures have been doing well.    Expected Outcomes Short: Eat to maintain weight Long: Continue to monitor risk factors.           ITP Comments:  ITP Comments    Row Name 10/27/19 0912 10/28/19 1107 11/03/19 0828 11/10/19 0552 12/01/19 0845   ITP Comments Completed virtual orientation today.  EP evaluation is scheduled for Thursday 3/11 at 930am.  Documentation [facility] diagnosis [facility]. Completed 6MWT and gym orientation.  Initial ITP created and sent for review to Dr. MEmily Filbert Medical Director. First full day of exercise!  Patient was oriented to gym and equipment including functions, settings, policies, and procedures.  Patient's individual exercise prescription and treatment plan were reviewed.  All starting workloads were  established based on the results of the 6 minute walk test done at initial orientation visit.  The plan for exercise progression was also introduced and progression will be customized based on patient's performance and goals. 30 day chart review  completed. ITP sent to Dr Zachery Dakins Medical Director, for review,changes as needed and signature. Continue with ITP if no changes requested Today while on the TM, sats dropped to 85%. Cued PLB and decreased Workload to get sat to 88%   Row Name 12/08/19 0530 12/08/19 0750 12/09/19 1321 12/16/19 0831 01/05/20 0534   ITP Comments 30 Day review completed. Medical Director review done, changes made as directed,and approval shown by signature of Market researcher. O2 Sat  Dropping to 85% today   Oxygen increased to 4 L Completed Initial RD Eval Adjusting oyygen while on TM for sat lower than 88%. Change did bring sats up to 93% 30 Day review completed. ITP review done, changes made as directed,and approval shown by signature of  Scientist, research (life sciences).   [place] Name 01/18/20 1528 01/24/20 1223 02/02/20 0518 02/02/20 1436 02/03/20 1553   ITP Comments Venus was out last week getting evaluated for lung transplant at [facility]. Called to check on pt. He was discharged yesterday from hospital.  Pt will ned to stay out until 6/21, should return on 6/23. 30 Day review completed. Medical Director ITP review done, changes made as directed,and signed approval by Medical Director. Out with COVID and transplant workup Alvon hopes to return on Wednesday next week.   Row Name 02/09/20 1700 02/09/20 0856 02/16/20 0749 03/01/20 0612 03/29/20 0546   ITP Comments Terral returns todayafter COVID diagnisis. [place] required O2 increase with exercise. Up to 4L from 2 L today O2 sats were as low as 76% Spoke to [place] about wearing oxygen when he comes to rehab. He did not have oxygen with him and Spo2 was 75 percent. Spoke to him why it is important to keep his oxygen above 88 percent on exertion. 30 Day review completed. Medical Director ITP review done, changes made as directed, and signed approval by Medical Director. 30 Day review completed. Medical Director ITP review done, changes made as directed, and signed approval by Medical Director.           Comments:

## 2020-03-30 ENCOUNTER — Telehealth: Payer: Self-pay | Admitting: Internal Medicine

## 2020-03-30 NOTE — Telephone Encounter (Signed)
Noted  

## 2020-04-03 ENCOUNTER — Telehealth: Payer: Self-pay | Admitting: Internal Medicine

## 2020-04-03 NOTE — Telephone Encounter (Signed)
Attempted to call pt's wife [name] but unable to reach and unable to leave VM. Will try to call back later.

## 2020-04-04 ENCOUNTER — Ambulatory Visit: Payer: PPO | Admitting: Internal Medicine

## 2020-04-04 MED ORDER — PREDNISONE 5 MG PO TABS
7.5000 mg | ORAL_TABLET | Freq: Every day | ORAL | 0 refills | Status: DC
Start: 2020-04-04 — End: 2020-08-04

## 2020-04-04 NOTE — Telephone Encounter (Signed)
Called and spoke to pt's wife. She states MR scripted a taper that ended with a daily dose of 7.5mg . Pt's wife states that dose helped. 7.5mg  was refilled to preferred pharmacy. Pt's wife verbalized understanding and denied any further questions or concerns at this time.    Will forward to MR as FYI.

## 2020-04-04 NOTE — Telephone Encounter (Signed)
Called and spoke to pt's wife. Pt c/o worsening arthroalgia in right arm, BIL hands, BIL knees and neck due to RA x 1 week. Questioned if pt is seeing rheumatology. Per pt's wife, pt left Dr. Anette Guarneri office to see [facility] but the provider he was seeing left the practice, pt is in between providers with rheum. Pt is questioning if Dr. Chase Caller is willing to rx prednisone.   Dr. Chase Caller, please advise. Thanks.

## 2020-04-04 NOTE — Telephone Encounter (Signed)
Yeah we can do that. What dose was he taking? You can refill that

## 2020-04-04 NOTE — Telephone Encounter (Signed)
Thanks Daneil Dan. Closing message

## 2020-04-05 DIAGNOSIS — C099 Malignant neoplasm of tonsil, unspecified: Secondary | ICD-10-CM | POA: Diagnosis not present

## 2020-04-06 DIAGNOSIS — M0579 Rheumatoid arthritis with rheumatoid factor of multiple sites without organ or systems involvement: Secondary | ICD-10-CM | POA: Diagnosis not present

## 2020-04-06 DIAGNOSIS — Z79899 Other long term (current) drug therapy: Secondary | ICD-10-CM | POA: Diagnosis not present

## 2020-04-10 DIAGNOSIS — C099 Malignant neoplasm of tonsil, unspecified: Secondary | ICD-10-CM | POA: Diagnosis not present

## 2020-04-11 DIAGNOSIS — C099 Malignant neoplasm of tonsil, unspecified: Secondary | ICD-10-CM | POA: Diagnosis not present

## 2020-04-12 DIAGNOSIS — Z5111 Encounter for antineoplastic chemotherapy: Secondary | ICD-10-CM | POA: Insufficient documentation

## 2020-04-12 DIAGNOSIS — C099 Malignant neoplasm of tonsil, unspecified: Secondary | ICD-10-CM | POA: Diagnosis not present

## 2020-04-13 DIAGNOSIS — C099 Malignant neoplasm of tonsil, unspecified: Secondary | ICD-10-CM | POA: Diagnosis not present

## 2020-04-13 DIAGNOSIS — R196 Halitosis: Secondary | ICD-10-CM | POA: Diagnosis not present

## 2020-04-14 DIAGNOSIS — Z5111 Encounter for antineoplastic chemotherapy: Secondary | ICD-10-CM | POA: Diagnosis not present

## 2020-04-14 DIAGNOSIS — C099 Malignant neoplasm of tonsil, unspecified: Secondary | ICD-10-CM | POA: Diagnosis not present

## 2020-04-14 DIAGNOSIS — R634 Abnormal weight loss: Secondary | ICD-10-CM | POA: Diagnosis not present

## 2020-04-17 DIAGNOSIS — C099 Malignant neoplasm of tonsil, unspecified: Secondary | ICD-10-CM | POA: Diagnosis not present

## 2020-04-17 DIAGNOSIS — R634 Abnormal weight loss: Secondary | ICD-10-CM | POA: Insufficient documentation

## 2020-04-18 DIAGNOSIS — C099 Malignant neoplasm of tonsil, unspecified: Secondary | ICD-10-CM | POA: Diagnosis not present

## 2020-04-19 DIAGNOSIS — C099 Malignant neoplasm of tonsil, unspecified: Secondary | ICD-10-CM | POA: Diagnosis not present

## 2020-04-20 ENCOUNTER — Telehealth: Payer: Self-pay | Admitting: Cardiovascular Disease

## 2020-04-20 DIAGNOSIS — G893 Neoplasm related pain (acute) (chronic): Secondary | ICD-10-CM | POA: Insufficient documentation

## 2020-04-20 DIAGNOSIS — R591 Generalized enlarged lymph nodes: Secondary | ICD-10-CM | POA: Diagnosis not present

## 2020-04-20 DIAGNOSIS — H6121 Impacted cerumen, right ear: Secondary | ICD-10-CM | POA: Diagnosis not present

## 2020-04-20 DIAGNOSIS — R1310 Dysphagia, unspecified: Secondary | ICD-10-CM | POA: Diagnosis not present

## 2020-04-20 DIAGNOSIS — B370 Candidal stomatitis: Secondary | ICD-10-CM | POA: Insufficient documentation

## 2020-04-20 DIAGNOSIS — Z510 Encounter for antineoplastic radiation therapy: Secondary | ICD-10-CM | POA: Diagnosis not present

## 2020-04-20 DIAGNOSIS — R070 Pain in throat: Secondary | ICD-10-CM | POA: Diagnosis not present

## 2020-04-20 DIAGNOSIS — K5900 Constipation, unspecified: Secondary | ICD-10-CM | POA: Diagnosis not present

## 2020-04-20 DIAGNOSIS — C099 Malignant neoplasm of tonsil, unspecified: Secondary | ICD-10-CM | POA: Diagnosis not present

## 2020-04-20 DIAGNOSIS — Z79899 Other long term (current) drug therapy: Secondary | ICD-10-CM | POA: Diagnosis not present

## 2020-04-20 DIAGNOSIS — Z5111 Encounter for antineoplastic chemotherapy: Secondary | ICD-10-CM | POA: Diagnosis not present

## 2020-04-20 DIAGNOSIS — Z87891 Personal history of nicotine dependence: Secondary | ICD-10-CM | POA: Diagnosis not present

## 2020-04-20 NOTE — Telephone Encounter (Signed)
A MyChart request was received from the patient stating he is in the donut hole and was requesting eliquis samples.  Message sent back to the patient that I currently have 3 weeks worth of samples for him that he may come pick up at our front desk.  Samples given: Eliquis 5 mg Lot: [id number] Exp: 01/2022 #3 boxes given

## 2020-04-21 DIAGNOSIS — J849 Interstitial pulmonary disease, unspecified: Secondary | ICD-10-CM | POA: Diagnosis not present

## 2020-04-21 DIAGNOSIS — C099 Malignant neoplasm of tonsil, unspecified: Secondary | ICD-10-CM | POA: Diagnosis not present

## 2020-04-21 DIAGNOSIS — Z5111 Encounter for antineoplastic chemotherapy: Secondary | ICD-10-CM | POA: Diagnosis not present

## 2020-04-25 DIAGNOSIS — C099 Malignant neoplasm of tonsil, unspecified: Secondary | ICD-10-CM | POA: Diagnosis not present

## 2020-04-26 DIAGNOSIS — C099 Malignant neoplasm of tonsil, unspecified: Secondary | ICD-10-CM | POA: Diagnosis not present

## 2020-04-27 DIAGNOSIS — R590 Localized enlarged lymph nodes: Secondary | ICD-10-CM | POA: Diagnosis not present

## 2020-04-27 DIAGNOSIS — C099 Malignant neoplasm of tonsil, unspecified: Secondary | ICD-10-CM | POA: Diagnosis not present

## 2020-04-27 DIAGNOSIS — B379 Candidiasis, unspecified: Secondary | ICD-10-CM | POA: Diagnosis not present

## 2020-04-27 DIAGNOSIS — B370 Candidal stomatitis: Secondary | ICD-10-CM | POA: Diagnosis not present

## 2020-04-27 DIAGNOSIS — K5900 Constipation, unspecified: Secondary | ICD-10-CM | POA: Diagnosis not present

## 2020-04-27 DIAGNOSIS — Z510 Encounter for antineoplastic radiation therapy: Secondary | ICD-10-CM | POA: Diagnosis not present

## 2020-04-27 DIAGNOSIS — Z681 Body mass index (BMI) 19 or less, adult: Secondary | ICD-10-CM | POA: Diagnosis not present

## 2020-04-27 DIAGNOSIS — K1230 Oral mucositis (ulcerative), unspecified: Secondary | ICD-10-CM | POA: Diagnosis not present

## 2020-04-27 DIAGNOSIS — Z87891 Personal history of nicotine dependence: Secondary | ICD-10-CM | POA: Diagnosis not present

## 2020-04-27 DIAGNOSIS — R630 Anorexia: Secondary | ICD-10-CM | POA: Diagnosis not present

## 2020-04-27 DIAGNOSIS — G893 Neoplasm related pain (acute) (chronic): Secondary | ICD-10-CM | POA: Diagnosis not present

## 2020-04-27 DIAGNOSIS — R634 Abnormal weight loss: Secondary | ICD-10-CM | POA: Diagnosis not present

## 2020-04-27 DIAGNOSIS — Z5111 Encounter for antineoplastic chemotherapy: Secondary | ICD-10-CM | POA: Diagnosis not present

## 2020-04-27 DIAGNOSIS — R591 Generalized enlarged lymph nodes: Secondary | ICD-10-CM | POA: Diagnosis not present

## 2020-04-27 DIAGNOSIS — J029 Acute pharyngitis, unspecified: Secondary | ICD-10-CM | POA: Diagnosis not present

## 2020-04-28 DIAGNOSIS — C099 Malignant neoplasm of tonsil, unspecified: Secondary | ICD-10-CM | POA: Diagnosis not present

## 2020-05-01 DIAGNOSIS — C099 Malignant neoplasm of tonsil, unspecified: Secondary | ICD-10-CM | POA: Diagnosis not present

## 2020-05-02 DIAGNOSIS — C099 Malignant neoplasm of tonsil, unspecified: Secondary | ICD-10-CM | POA: Diagnosis not present

## 2020-05-02 DIAGNOSIS — C109 Malignant neoplasm of oropharynx, unspecified: Secondary | ICD-10-CM | POA: Diagnosis not present

## 2020-05-02 NOTE — Progress Notes (Signed)
NEUROLOGY CONSULTATION NOTE  Lawrence Wells MRN: [id number] DOB: [birth year]  Referring provider: Geraldo Pitter, NP Primary care provider: Nobie Putnam, DO  Reason for consult:  Left foot drop  HISTORY OF PRESENT ILLNESS: Lawrence Wells is a 64 year old -hrightanded male with right tonsillar squamous cell carcinoma, rheumatoid arthritis, and interstitial lung disease who presents for headache and left foot drop.  History supplemented by referring provider's note.  He was hospitalized in June for worsening hypoxic respiratory distress aggravated by COVID.  Following his hospitalization, he has had a left foot drop.  Some nonradiationg mid lower back pain but no radicular pain or numbness down the leg.  No other muscle weakness in the left leg.  He has not been to physical therapy.  He feels it may have gotten a little worse.  He does report crossing his legs.    PAST MEDICAL HISTORY: Past Medical History:  Diagnosis Date   Arthritis    Collagen vascular disease ([place])    Rhematoid Arthritis   COPD (chronic obstructive pulmonary disease) (HCC)    Coronary artery disease    Dyspnea    Emphysema lung (HCC)    Hyperlipidemia    Pulmonary filariasis     PAST SURGICAL HISTORY: Past Surgical History:  Procedure Laterality Date   SINUS SURGERY WITH INSTATRAK      MEDICATIONS: Current Outpatient Medications on File Prior to Visit  Medication Sig Dispense Refill   albuterol (PROAIR HFA) 108 (90 Base) MCG/ACT inhaler INHALE 2 PUFFS INTO THE LUNGS EVERY 4 HOURS AS NEEDED FOR WHEEZING OR SHORTNESS OF BREATH OR COUGH     apixaban (ELIQUIS) 5 MG TABS tablet Take 1 tablet (5 mg total) by mouth 2 (two) times daily. 180 tablet 3   azaTHIOprine (IMURAN) 50 MG tablet Take 2 tablets (100 mg total) by mouth daily. 180 tablet 0   diclofenac Sodium (VOLTAREN) 1 % GEL Apply 2 g topically 3 (three) times daily as needed. (Patient taking differently: Apply 2 g topically 3  (three) times daily as needed (Pain (Arthritis)). ) 100 g 2   ezetimibe (ZETIA) 10 MG tablet Take 1 tablet (10 mg total) by mouth daily. 90 tablet 3   fluticasone (FLONASE) 50 MCG/ACT nasal spray Place 2 sprays into both nostrils daily. Use for 4-6 weeks then stop and use seasonally or as needed. 16 g 3   Multiple Vitamin (MULTIVITAMIN) tablet Take 1 tablet by mouth daily.     Nintedanib (OFEV) 150 MG CAPS Please take 1 tablet oral daily for 1 week, then resume back to 1 tablet twice daily after  that 60 capsule 11   OXYGEN Inhale into the lungs as needed.     pantoprazole (PROTONIX) 40 MG tablet Take 1 tablet (40 mg total) by mouth daily. 30 tablet 1   predniSONE (DELTASONE) 10 MG tablet 4 x 3 days, 3 x 3 days, 2 x 3 days, 1 x 3 then resume 7.5 mg 30 tablet 0   predniSONE (DELTASONE) 5 MG tablet Take 1.5 tablets (7.5 mg total) by mouth daily with breakfast. 30 tablet 0   Tiotropium Bromide Monohydrate (SPIRIVA RESPIMAT) 2.5 MCG/ACT AERS Inhale 2 puffs into the lungs daily. 1 g 5   valACYclovir (VALTREX) 500 MG tablet Take 1 tablet (500 mg total) by mouth 2 (two) times daily as needed (herpes flare). For 3-7 days as needed for flare 30 tablet 1   No current facility-administered medications on file prior to visit.    ALLERGIES:  Allergies  Allergen Reactions   Atorvastatin Other (See Comments)    Myalgias    FAMILY HISTORY: Family History  Problem Relation Age of Onset   Heart disease Mother    Heart attack Mother 53   Arthritis Mother    Heart disease Father    Heart attack Father 95   Healthy Sister    Hyperlipidemia Brother    Throat cancer Brother 76   Heart disease Brother    Healthy Son    Healthy Daughter    SOCIAL HISTORY: Social History   Socioeconomic History   Marital status: Married    Spouse name: Cabin crew   Number of children: Not on file   Years of education: High School   Highest education level: Not on file  Occupational  History   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[employer])    Comment: Retired 2012  Tobacco Use   Smoking status: Former Smoker    Packs/day: 1.50    Years: 30.00    Pack years: 45.00    Types: Cigarettes    Quit date: 2007    Years since quitting: 14.7   Smokeless tobacco: Former Systems developer    Types: [place] date: 2007   Tobacco comment: Dip smokeless tobacco >20-30 years  Vaping Use   Vaping Use: Never used  Substance and Sexual Activity   Alcohol use: Yes    Comment: occ   Drug use: No   Sexual activity: Not on file  Other Topics Concern   Not on file  Social History Narrative   Not on file   Social Determinants of Health   Financial Resource Strain:    Difficulty of Paying Living Expenses: Not on file  Food Insecurity:    Worried About Charity fundraiser in the Last Year: Not on file   [employer] of Food in the Last Year: Not on file  Transportation Needs:    Lack of Transportation (Medical): Not on file   Lack of Transportation (Non-Medical): Not on file  Physical Activity:    Days of Exercise per Week: Not on file   Minutes of Exercise per Session: Not on file  Stress:    Feeling of Stress : Not on file  Social Connections:    Frequency of Communication with Friends and Family: Not on file   Frequency of Social Gatherings with Friends and Family: Not on file   Attends Religious Services: Not on file   Active Member of Clubs or Organizations: Not on file   Attends Archivist Meetings: Not on file   Marital Status: Not on file  Intimate Partner Violence:    Fear of Current or Ex-Partner: Not on file   Emotionally Abused: Not on file   Physically Abused: Not on file   Sexually Abused: Not on file    PHYSICAL EXAM: Blood pressure 95/62, pulse (!) 104, resp. rate 18, height 5\' 11"  (1.803 m), weight 132 lb (59.9 kg), SpO2 91 %. General: No acute distress.  Patient appears well-groomed.  Head:  Normocephalic/atraumatic Eyes:  fundi  examined but not visualized Neck: supple, no paraspinal tenderness, full range of motion Back: No paraspinal tenderness Heart: regular rate and rhythm Lungs: Clear to auscultation bilaterally. Vascular: No carotid bruits. Neurological Exam: Mental status: alert and oriented to person, place, and time, recent and remote memory intact, fund of knowledge intact, attention and concentration intact, speech fluent and not dysarthric, language intact. Cranial nerves: CN I: not tested CN  II: pupils equal, round and reactive to light, visual fields intact CN III, IV, VI:  full range of motion, no nystagmus, no ptosis CN V: facial sensation intact CN VII: upper and lower face symmetric CN VIII: hearing intact CN IX, X: gag intact, uvula midline CN XI: sternocleidomastoid and trapezius muscles intact CN XII: tongue midline Bulk & Tone: normal, no fasciculations. Motor:  Left ankle dorsiflexion 0/5, left plantarflexion 5-/5.  Otherwise,5/5 throughout Sensation:  Pinprick sensation reduced in both feet up to below knees as well as hands; and vibration sensation intact except reduced in left foot.. Deep Tendon Reflexes:  2+ throughout, toes downgoing. Finger to nose testing:  Without dysmetria.  Heel to shin:  Without dysmetria.  Gait:  Left steppage gait.  Romberg negative.  IMPRESSION: 1.  Left foot drop.  Evaluate for peroneal neuropathy vs L5 radiculopathy. 2.  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  PLAN: 1.  NCV-EMG of left lower extremity.  Further recommendations pending results. 2.  Physical therapy 3.  Follow up 4 months.  Thank you for allowing me to take part in the care of this patient.  Metta Clines, DO  CC:  Geraldo Pitter, NP  Nobie Putnam, DO

## 2020-05-03 ENCOUNTER — Encounter: Payer: Self-pay | Admitting: Neurology

## 2020-05-03 ENCOUNTER — Other Ambulatory Visit: Payer: Self-pay

## 2020-05-03 ENCOUNTER — Ambulatory Visit (INDEPENDENT_AMBULATORY_CARE_PROVIDER_SITE_OTHER): Payer: PPO | Admitting: Neurology

## 2020-05-03 VITALS — BP 95/62 | HR 104 | Resp 18 | Ht 71.0 in | Wt 132.0 lb

## 2020-05-03 DIAGNOSIS — C099 Malignant neoplasm of tonsil, unspecified: Secondary | ICD-10-CM | POA: Diagnosis not present

## 2020-05-03 DIAGNOSIS — M21372 Foot drop, left foot: Secondary | ICD-10-CM

## 2020-05-03 NOTE — Patient Instructions (Signed)
1.  We will order you physical therapy for left foot drop 2.  I will have you schedule an appointment for a nerve conduction study on the left foot. 3.  Follow up 4 months.

## 2020-05-04 DIAGNOSIS — C099 Malignant neoplasm of tonsil, unspecified: Secondary | ICD-10-CM | POA: Diagnosis not present

## 2020-05-05 DIAGNOSIS — G893 Neoplasm related pain (acute) (chronic): Secondary | ICD-10-CM | POA: Diagnosis not present

## 2020-05-05 DIAGNOSIS — C099 Malignant neoplasm of tonsil, unspecified: Secondary | ICD-10-CM | POA: Diagnosis not present

## 2020-05-05 DIAGNOSIS — Z5111 Encounter for antineoplastic chemotherapy: Secondary | ICD-10-CM | POA: Diagnosis not present

## 2020-05-05 DIAGNOSIS — R682 Dry mouth, unspecified: Secondary | ICD-10-CM | POA: Diagnosis not present

## 2020-05-08 DIAGNOSIS — C099 Malignant neoplasm of tonsil, unspecified: Secondary | ICD-10-CM | POA: Diagnosis not present

## 2020-05-09 DIAGNOSIS — C099 Malignant neoplasm of tonsil, unspecified: Secondary | ICD-10-CM | POA: Diagnosis not present

## 2020-05-10 DIAGNOSIS — C099 Malignant neoplasm of tonsil, unspecified: Secondary | ICD-10-CM | POA: Diagnosis not present

## 2020-05-11 DIAGNOSIS — C099 Malignant neoplasm of tonsil, unspecified: Secondary | ICD-10-CM | POA: Diagnosis not present

## 2020-05-11 DIAGNOSIS — C109 Malignant neoplasm of oropharynx, unspecified: Secondary | ICD-10-CM | POA: Diagnosis not present

## 2020-05-12 DIAGNOSIS — Z9981 Dependence on supplemental oxygen: Secondary | ICD-10-CM | POA: Diagnosis not present

## 2020-05-12 DIAGNOSIS — R634 Abnormal weight loss: Secondary | ICD-10-CM | POA: Diagnosis not present

## 2020-05-12 DIAGNOSIS — B370 Candidal stomatitis: Secondary | ICD-10-CM | POA: Diagnosis not present

## 2020-05-12 DIAGNOSIS — C099 Malignant neoplasm of tonsil, unspecified: Secondary | ICD-10-CM | POA: Diagnosis not present

## 2020-05-12 DIAGNOSIS — R112 Nausea with vomiting, unspecified: Secondary | ICD-10-CM | POA: Diagnosis not present

## 2020-05-12 DIAGNOSIS — J849 Interstitial pulmonary disease, unspecified: Secondary | ICD-10-CM | POA: Diagnosis not present

## 2020-05-12 DIAGNOSIS — G893 Neoplasm related pain (acute) (chronic): Secondary | ICD-10-CM | POA: Diagnosis not present

## 2020-05-12 DIAGNOSIS — T451X5A Adverse effect of antineoplastic and immunosuppressive drugs, initial encounter: Secondary | ICD-10-CM | POA: Diagnosis not present

## 2020-05-12 DIAGNOSIS — Z87891 Personal history of nicotine dependence: Secondary | ICD-10-CM | POA: Diagnosis not present

## 2020-05-12 DIAGNOSIS — K5900 Constipation, unspecified: Secondary | ICD-10-CM | POA: Diagnosis not present

## 2020-05-12 DIAGNOSIS — Z5111 Encounter for antineoplastic chemotherapy: Secondary | ICD-10-CM | POA: Diagnosis not present

## 2020-05-12 DIAGNOSIS — Z931 Gastrostomy status: Secondary | ICD-10-CM | POA: Diagnosis not present

## 2020-05-12 DIAGNOSIS — Z681 Body mass index (BMI) 19 or less, adult: Secondary | ICD-10-CM | POA: Diagnosis not present

## 2020-05-12 DIAGNOSIS — Z79899 Other long term (current) drug therapy: Secondary | ICD-10-CM | POA: Diagnosis not present

## 2020-05-15 DIAGNOSIS — C099 Malignant neoplasm of tonsil, unspecified: Secondary | ICD-10-CM | POA: Diagnosis not present

## 2020-05-15 DIAGNOSIS — J849 Interstitial pulmonary disease, unspecified: Secondary | ICD-10-CM | POA: Diagnosis not present

## 2020-05-15 DIAGNOSIS — R64 Cachexia: Secondary | ICD-10-CM | POA: Diagnosis not present

## 2020-05-15 DIAGNOSIS — Z87891 Personal history of nicotine dependence: Secondary | ICD-10-CM | POA: Diagnosis not present

## 2020-05-15 DIAGNOSIS — Z681 Body mass index (BMI) 19 or less, adult: Secondary | ICD-10-CM | POA: Diagnosis not present

## 2020-05-15 DIAGNOSIS — Z515 Encounter for palliative care: Secondary | ICD-10-CM | POA: Diagnosis not present

## 2020-05-15 DIAGNOSIS — C109 Malignant neoplasm of oropharynx, unspecified: Secondary | ICD-10-CM | POA: Diagnosis not present

## 2020-05-15 DIAGNOSIS — B370 Candidal stomatitis: Secondary | ICD-10-CM | POA: Diagnosis not present

## 2020-05-15 DIAGNOSIS — J84112 Idiopathic pulmonary fibrosis: Secondary | ICD-10-CM | POA: Diagnosis not present

## 2020-05-15 DIAGNOSIS — G893 Neoplasm related pain (acute) (chronic): Secondary | ICD-10-CM | POA: Diagnosis not present

## 2020-05-15 DIAGNOSIS — E876 Hypokalemia: Secondary | ICD-10-CM | POA: Diagnosis not present

## 2020-05-15 DIAGNOSIS — Z9981 Dependence on supplemental oxygen: Secondary | ICD-10-CM | POA: Diagnosis not present

## 2020-05-15 DIAGNOSIS — Z20822 Contact with and (suspected) exposure to covid-19: Secondary | ICD-10-CM | POA: Diagnosis not present

## 2020-05-15 DIAGNOSIS — R1310 Dysphagia, unspecified: Secondary | ICD-10-CM | POA: Diagnosis not present

## 2020-05-15 DIAGNOSIS — M159 Polyosteoarthritis, unspecified: Secondary | ICD-10-CM | POA: Diagnosis not present

## 2020-05-15 DIAGNOSIS — M0579 Rheumatoid arthritis with rheumatoid factor of multiple sites without organ or systems involvement: Secondary | ICD-10-CM | POA: Diagnosis not present

## 2020-05-15 DIAGNOSIS — E860 Dehydration: Secondary | ICD-10-CM | POA: Diagnosis not present

## 2020-05-15 DIAGNOSIS — Z79899 Other long term (current) drug therapy: Secondary | ICD-10-CM | POA: Diagnosis not present

## 2020-05-15 DIAGNOSIS — E43 Unspecified severe protein-calorie malnutrition: Secondary | ICD-10-CM | POA: Diagnosis not present

## 2020-05-15 DIAGNOSIS — K1233 Oral mucositis (ulcerative) due to radiation: Secondary | ICD-10-CM | POA: Diagnosis not present

## 2020-05-15 DIAGNOSIS — J449 Chronic obstructive pulmonary disease, unspecified: Secondary | ICD-10-CM | POA: Diagnosis not present

## 2020-05-15 DIAGNOSIS — J9611 Chronic respiratory failure with hypoxia: Secondary | ICD-10-CM | POA: Diagnosis not present

## 2020-05-15 DIAGNOSIS — R070 Pain in throat: Secondary | ICD-10-CM | POA: Diagnosis not present

## 2020-05-16 DIAGNOSIS — C099 Malignant neoplasm of tonsil, unspecified: Secondary | ICD-10-CM | POA: Diagnosis not present

## 2020-05-17 DIAGNOSIS — C099 Malignant neoplasm of tonsil, unspecified: Secondary | ICD-10-CM | POA: Diagnosis not present

## 2020-05-18 DIAGNOSIS — K1233 Oral mucositis (ulcerative) due to radiation: Secondary | ICD-10-CM | POA: Insufficient documentation

## 2020-05-20 DIAGNOSIS — E43 Unspecified severe protein-calorie malnutrition: Secondary | ICD-10-CM | POA: Insufficient documentation

## 2020-05-23 DIAGNOSIS — C109 Malignant neoplasm of oropharynx, unspecified: Secondary | ICD-10-CM | POA: Diagnosis not present

## 2020-05-23 DIAGNOSIS — C099 Malignant neoplasm of tonsil, unspecified: Secondary | ICD-10-CM | POA: Diagnosis not present

## 2020-05-23 DIAGNOSIS — R1312 Dysphagia, oropharyngeal phase: Secondary | ICD-10-CM | POA: Diagnosis not present

## 2020-05-24 DIAGNOSIS — C099 Malignant neoplasm of tonsil, unspecified: Secondary | ICD-10-CM | POA: Diagnosis not present

## 2020-05-25 ENCOUNTER — Ambulatory Visit: Payer: PPO | Admitting: Physician Assistant

## 2020-05-25 DIAGNOSIS — C099 Malignant neoplasm of tonsil, unspecified: Secondary | ICD-10-CM | POA: Diagnosis not present

## 2020-05-25 DIAGNOSIS — Z87891 Personal history of nicotine dependence: Secondary | ICD-10-CM | POA: Diagnosis not present

## 2020-05-25 DIAGNOSIS — Z510 Encounter for antineoplastic radiation therapy: Secondary | ICD-10-CM | POA: Diagnosis not present

## 2020-05-25 DIAGNOSIS — R070 Pain in throat: Secondary | ICD-10-CM | POA: Diagnosis not present

## 2020-05-25 DIAGNOSIS — T451X5A Adverse effect of antineoplastic and immunosuppressive drugs, initial encounter: Secondary | ICD-10-CM | POA: Diagnosis not present

## 2020-05-25 DIAGNOSIS — R112 Nausea with vomiting, unspecified: Secondary | ICD-10-CM | POA: Diagnosis not present

## 2020-05-25 DIAGNOSIS — C109 Malignant neoplasm of oropharynx, unspecified: Secondary | ICD-10-CM | POA: Diagnosis not present

## 2020-05-25 DIAGNOSIS — Z9981 Dependence on supplemental oxygen: Secondary | ICD-10-CM | POA: Diagnosis not present

## 2020-05-25 DIAGNOSIS — Z931 Gastrostomy status: Secondary | ICD-10-CM | POA: Diagnosis not present

## 2020-05-25 DIAGNOSIS — R1310 Dysphagia, unspecified: Secondary | ICD-10-CM | POA: Diagnosis not present

## 2020-05-26 DIAGNOSIS — R112 Nausea with vomiting, unspecified: Secondary | ICD-10-CM | POA: Diagnosis not present

## 2020-05-26 DIAGNOSIS — Z5111 Encounter for antineoplastic chemotherapy: Secondary | ICD-10-CM | POA: Diagnosis not present

## 2020-05-26 DIAGNOSIS — T451X5A Adverse effect of antineoplastic and immunosuppressive drugs, initial encounter: Secondary | ICD-10-CM | POA: Diagnosis not present

## 2020-05-26 DIAGNOSIS — C099 Malignant neoplasm of tonsil, unspecified: Secondary | ICD-10-CM | POA: Diagnosis not present

## 2020-05-27 DIAGNOSIS — R1310 Dysphagia, unspecified: Secondary | ICD-10-CM | POA: Diagnosis not present

## 2020-05-29 DIAGNOSIS — C099 Malignant neoplasm of tonsil, unspecified: Secondary | ICD-10-CM | POA: Diagnosis not present

## 2020-05-30 DIAGNOSIS — C099 Malignant neoplasm of tonsil, unspecified: Secondary | ICD-10-CM | POA: Diagnosis not present

## 2020-06-09 DIAGNOSIS — Z931 Gastrostomy status: Secondary | ICD-10-CM | POA: Insufficient documentation

## 2020-06-09 DIAGNOSIS — B370 Candidal stomatitis: Secondary | ICD-10-CM | POA: Diagnosis not present

## 2020-06-09 DIAGNOSIS — G893 Neoplasm related pain (acute) (chronic): Secondary | ICD-10-CM | POA: Diagnosis not present

## 2020-06-09 DIAGNOSIS — Z5111 Encounter for antineoplastic chemotherapy: Secondary | ICD-10-CM | POA: Diagnosis not present

## 2020-06-09 DIAGNOSIS — C099 Malignant neoplasm of tonsil, unspecified: Secondary | ICD-10-CM | POA: Diagnosis not present

## 2020-06-09 DIAGNOSIS — K9429 Other complications of gastrostomy: Secondary | ICD-10-CM | POA: Diagnosis not present

## 2020-06-09 DIAGNOSIS — E43 Unspecified severe protein-calorie malnutrition: Secondary | ICD-10-CM | POA: Diagnosis not present

## 2020-06-09 DIAGNOSIS — R591 Generalized enlarged lymph nodes: Secondary | ICD-10-CM | POA: Diagnosis not present

## 2020-06-19 DIAGNOSIS — R1310 Dysphagia, unspecified: Secondary | ICD-10-CM | POA: Diagnosis not present

## 2020-06-20 ENCOUNTER — Encounter: Payer: PPO | Admitting: Neurology

## 2020-06-20 ENCOUNTER — Telehealth: Payer: Self-pay | Admitting: Internal Medicine

## 2020-06-20 NOTE — Telephone Encounter (Signed)
Called and spoke to pt, states that his wife typically schedules his appts and she is not home right now.  Asked [place] to call back tomorrow (11/3) to schedule ROV with pt's wife. Will leave message open to call wife tomorrow and schedule.

## 2020-06-20 NOTE — Telephone Encounter (Signed)
Really sad. He got covid and then sudden cancer.  Plan  - ok to stop ofev - our wishes and paryers for his speedy recovery  - return to see me first available - if they can - at [facility] or [place]

## 2020-06-20 NOTE — Telephone Encounter (Signed)
Spoke with [facility]  He states that the pt informed them that the pt hs stopped taking OFEV for now  I called and spoke with the pt's spouse  She states pt has been on a feeding tube for the past 6 wks and not able to swallow anything  He is being txed for tonsillar CA  Spouse also wants MR to know that throughout this since the last visit pt has only had to use his o2 x 1

## 2020-06-21 DIAGNOSIS — H11052 Peripheral pterygium, progressive, left eye: Secondary | ICD-10-CM | POA: Diagnosis not present

## 2020-06-21 DIAGNOSIS — L03213 Periorbital cellulitis: Secondary | ICD-10-CM | POA: Diagnosis not present

## 2020-06-21 DIAGNOSIS — J849 Interstitial pulmonary disease, unspecified: Secondary | ICD-10-CM | POA: Diagnosis not present

## 2020-06-21 DIAGNOSIS — R1310 Dysphagia, unspecified: Secondary | ICD-10-CM | POA: Diagnosis not present

## 2020-06-23 DIAGNOSIS — C099 Malignant neoplasm of tonsil, unspecified: Secondary | ICD-10-CM | POA: Diagnosis not present

## 2020-06-23 DIAGNOSIS — C109 Malignant neoplasm of oropharynx, unspecified: Secondary | ICD-10-CM | POA: Diagnosis not present

## 2020-06-23 DIAGNOSIS — R1312 Dysphagia, oropharyngeal phase: Secondary | ICD-10-CM | POA: Diagnosis not present

## 2020-06-23 NOTE — Telephone Encounter (Signed)
ATC patient's spouse, Jody(DPR). Unable to leave vm due to mailbox not being setup.  Will call back.

## 2020-06-27 DIAGNOSIS — C099 Malignant neoplasm of tonsil, unspecified: Secondary | ICD-10-CM | POA: Diagnosis not present

## 2020-06-28 NOTE — Telephone Encounter (Signed)
appt scheduled for 08/04/2020 at 2:30. Patient's spouse, Jody(DPR) is aware and voiced her understanding.  Nothing further needed.

## 2020-07-06 DIAGNOSIS — E43 Unspecified severe protein-calorie malnutrition: Secondary | ICD-10-CM | POA: Diagnosis not present

## 2020-07-06 DIAGNOSIS — Z5111 Encounter for antineoplastic chemotherapy: Secondary | ICD-10-CM | POA: Diagnosis not present

## 2020-07-06 DIAGNOSIS — C109 Malignant neoplasm of oropharynx, unspecified: Secondary | ICD-10-CM | POA: Diagnosis not present

## 2020-07-06 DIAGNOSIS — C099 Malignant neoplasm of tonsil, unspecified: Secondary | ICD-10-CM | POA: Diagnosis not present

## 2020-07-06 DIAGNOSIS — R591 Generalized enlarged lymph nodes: Secondary | ICD-10-CM | POA: Diagnosis not present

## 2020-07-19 DIAGNOSIS — R1310 Dysphagia, unspecified: Secondary | ICD-10-CM | POA: Diagnosis not present

## 2020-07-21 DIAGNOSIS — R1310 Dysphagia, unspecified: Secondary | ICD-10-CM | POA: Diagnosis not present

## 2020-07-21 DIAGNOSIS — J849 Interstitial pulmonary disease, unspecified: Secondary | ICD-10-CM | POA: Diagnosis not present

## 2020-07-23 DIAGNOSIS — C109 Malignant neoplasm of oropharynx, unspecified: Secondary | ICD-10-CM | POA: Diagnosis not present

## 2020-07-23 DIAGNOSIS — C099 Malignant neoplasm of tonsil, unspecified: Secondary | ICD-10-CM | POA: Diagnosis not present

## 2020-07-23 DIAGNOSIS — R1312 Dysphagia, oropharyngeal phase: Secondary | ICD-10-CM | POA: Diagnosis not present

## 2020-07-28 DIAGNOSIS — Z5111 Encounter for antineoplastic chemotherapy: Secondary | ICD-10-CM | POA: Diagnosis not present

## 2020-07-28 DIAGNOSIS — C099 Malignant neoplasm of tonsil, unspecified: Secondary | ICD-10-CM | POA: Diagnosis not present

## 2020-07-28 DIAGNOSIS — Z923 Personal history of irradiation: Secondary | ICD-10-CM | POA: Diagnosis not present

## 2020-07-28 DIAGNOSIS — R591 Generalized enlarged lymph nodes: Secondary | ICD-10-CM | POA: Diagnosis not present

## 2020-07-28 DIAGNOSIS — Z9221 Personal history of antineoplastic chemotherapy: Secondary | ICD-10-CM | POA: Diagnosis not present

## 2020-07-28 DIAGNOSIS — Z23 Encounter for immunization: Secondary | ICD-10-CM | POA: Diagnosis not present

## 2020-07-28 DIAGNOSIS — Z87891 Personal history of nicotine dependence: Secondary | ICD-10-CM | POA: Diagnosis not present

## 2020-07-28 DIAGNOSIS — Z931 Gastrostomy status: Secondary | ICD-10-CM | POA: Diagnosis not present

## 2020-07-28 DIAGNOSIS — J84112 Idiopathic pulmonary fibrosis: Secondary | ICD-10-CM | POA: Diagnosis not present

## 2020-08-04 ENCOUNTER — Other Ambulatory Visit: Payer: Self-pay

## 2020-08-04 ENCOUNTER — Encounter: Payer: Self-pay | Admitting: Internal Medicine

## 2020-08-04 ENCOUNTER — Ambulatory Visit (INDEPENDENT_AMBULATORY_CARE_PROVIDER_SITE_OTHER): Payer: PPO | Admitting: Internal Medicine

## 2020-08-04 VITALS — BP 100/60 | HR 74 | Temp 98.2°F | Ht 71.0 in | Wt 144.8 lb

## 2020-08-04 DIAGNOSIS — R1310 Dysphagia, unspecified: Secondary | ICD-10-CM

## 2020-08-04 DIAGNOSIS — C099 Malignant neoplasm of tonsil, unspecified: Secondary | ICD-10-CM

## 2020-08-04 DIAGNOSIS — M359 Systemic involvement of connective tissue, unspecified: Secondary | ICD-10-CM

## 2020-08-04 DIAGNOSIS — R634 Abnormal weight loss: Secondary | ICD-10-CM | POA: Diagnosis not present

## 2020-08-04 DIAGNOSIS — J8489 Other specified interstitial pulmonary diseases: Secondary | ICD-10-CM

## 2020-08-04 NOTE — Patient Instructions (Addendum)
ICD-10-CM   1. Interstitial lung disease due to connective tissue disease ([place])  J84.89    M35.9   2. Tonsillar cancer ([place])  C09.9   3. Dysphagia, unspecified type  R13.10   4. Weight loss  R63.4    Pulmonary fibrosis itself appears to be stable and I am glad for it  Noted that you are not able to take nintedanib because of swallowing difficulties associated with tonsillar cancer  Glad that you are putting of a good fight against the cancer  Plan -Continue observation supportive care for pulmonary fibrosis  -In January 2022 once the PET scan is done please check with yourr cancer Dr. And your nose throat doctor if it is safe to swallow nintedanib -.  And then you can start nintedanib  -Make sure liver function test prior to that is normal  (your doctor skin check that]  -In February 20 07 November 2020 -do spirometry and DLCO  - talk to your [facility] doctors about "black coated tongue"  Follow-up -Return to see Dr. Chase Caller in February/March 2022 for 30-minute visit -either [place] or [place]

## 2020-08-04 NOTE — Addendum Note (Signed)
Addended by: Claudette Head A on: 08/04/2020 03:13 PM   Modules accepted: Orders

## 2020-08-04 NOTE — Progress Notes (Signed)
OV 09/10/2019  Subjective:  Patient ID: Lawrence Wells, male , DOB: 29-Dec-1955 , age 64 y.o. , MRN: [id number] , ADDRESS: [place] [address]   09/10/2019 -   Chief Complaint  Patient presents with  . pulmonary consult    per Dr. Parks Ranger- CXR 09/08/2019. pt reports of sob with exertion, talking and bending, prod cough with yellow mucus, left sided chest discomfort  and wheezing.     HPI Lawrence Wells 64 y.o. -referred for interstitial lung disease after a chest x-ray from September 08, 2019 that I personally visualized and interpreted shows diffuse ILD along with left upper lobe nodule.  Patient has wife tell me that he has had insidious onset of shortness of breath for a year with progression in the last few months.  They also tell me that few years ago he had an chest x-ray done for an incidental unrelated reason that showed chronic changes of scarring but apparently were reassured.  In review of his chart and noticed that in 2005 this is chest x-ray done in the [facility] system with reports of ILD in it.  He is unaware of that.  History for this visit is given by the patient and his wife and review of the chart.  Integrated Comprehensive ILD Questionnaire  Symptoms:   Insidious onset of shortness of breath for the last year getting worse in the last 3 to 4 months.  Definitely no dyspnea a few years ago even though chest x-ray from 2005 was reported as ILD.  Sometimes dyspnea is episodic but mostly with exertion relieved by rest.  Symptom severity is listed below.  There is associated significant cough.  Cough started in October 2020.  It is getting worse cough is rated as moderate to severe.  He coughs at night.  He does bring up some yellow phlegm.  It is worse when he lies down.Lawrence Wells  He does clear his throat and he does feel a tickle in the back of the throat.  There is also associated wheezing.       Past Medical History : He gives a history positive  for arthritis.  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  Denies any asthma or known COPD.  Denies heart failure.  Denies scleroderma any collagen vascular disease.  Denies diagnosis of acid reflux or hiatal hernia.  Denies obstructive sleep apnea.  Denies pulmonary hypertension.  Denies diabetes or thyroid disease or stroke or seizures.  Denies infectious mononucleosis.  Denies hepatitis.  Denies tuberculosis denies kidney disease.  Denies blood clots denies heart disease denies pleurisy.   ROS: Positive for fatigue and arthralgia.  Positive for acid reflux.  He went to check the box for [name].  Denies any GI symptoms or rash or ulcers  FAMILY HISTORY of LUNG DISEASE: Denies including COPD and pulmonary fibrosis   EXPOSURE HISTORY: Smokes cigarettes between 1983 and 2007 1 to 2 packs/day and then quit.  Did not smoke cigars did not smoke pipe.  Did not vape.  Never smoked marijuana or cocaine.  Never used intravenous drugs.   HOME and HOBBY DETAILS : [place].  The age of the home is 20 years.  There is no dampness in the living environment.  No mold or mildew.  Does not use  humidifier.  No CPAP use.  Does not use nebulizer.  No steam iron use.  No Jacuzzi.  No pet birds or parakeets in the house.  No pet gerbils.  No feather pillows.  No mold in the [facility] duct.  Does not play wind instruments.  Does not do any gardening.   OCCUPATIONAL HISTORY (122 questions) : Works in the Transport planner for 30 years.  Worked in [employer] and crawl spaces.  A lot of dust exposure.  During this time he worked in damp air-conditioned spaces.  He thinks he had asbestos exposure.  He also worked in the [employer].  He cleaned AC coils.  Otherwise history is negative   PULMONARY TOXICITY HISTORY (27 items): Entirely  negative.       ROS - per HPI    OV 10/22/2019  Subjective:  Patient ID: Lawrence Wells, male , DOB: Lawrence 11, 1957 , age 35 y.o. , MRN: [id number] , ADDRESS: [place] [id number]   10/22/2019 -   Chief Complaint  Patient presents with  . Follow-up    ILD Followup. SOB on exertion. Non prod cough. Pt denies any wheezing, fever, chills, or sweats.   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  Started on nintedanib 10/12/2019  Associated emphysema present on CT scan January 2021  Normal cardiac stress test 10/14/2019   HPI Lawrence Wells 64 y.o. -presents for follow-up with his wife.  At last saw him end of January 2021 for ILD evaluation.  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  He then saw a rheumatologist Dr. D yesterday 10/21/2019 and given a diagnosis of joint rheumatoid arthritis.  In the interim he also saw pulmonary nurse practitioner for a virtual visit and was started on nintedanib but she started [place] on 10/12/2019.  So far is tolerating it fine.  He is using oxygen only with exertion.  There is no deterioration in symptoms.  Yesterday the rheumatology visit Imuran has been recommended first rheumatoid arthritis.  The TPMT test has been done and the results are pending.  He does not have liver function tests checked after he started his nintedanib.  He and his wife have many questions about the future course of the disease and natural history and other management strategies.  Currently is free was not helping him but he is willing to take it because of the associated emphysema.   I personally visualized and reviewed the  CT and also interpreted the findings myself   OV 12/02/2019  Subjective:  Patient ID: Lawrence Wells, male , DOB: [birth year] , age 19 y.o. , MRN: [id number] , ADDRESS: [place] [address]   12/02/2019 -   Chief Complaint  Patient presents with  . Follow-up    Follow-up rheumatoid arthritis-ILD/UIP pattern (based on high titer rheumatoid factor 09/10/2019, UIP on CT scan 09/14/2019 and rheumatology visit confirming diagnosis of rheumatoid arthritis on 10/21/2019].  Started on nintedanib 10/12/2019  Associated emphysema present on CT scan January 2021  Normal cardiac stress test 10/14/2019  Rheumatoid arthritis on Imuran  HPI Masin E Furniss 64 y.o. -6 returns for follow-up with his wife to  the [place] clinic.  He is a Engineer, manufacturing systems.  He normally seen in the [place] clinic.  At this point in time he tells me that he is tolerating the nintedanib fine.  He needs a liver function test.  However he has lost weight 160 pounds.  He does not know the reason.  He is tolerating nintedanib fine.  He is on Imuran for his rheumatoid arthritis.  In terms of his effort tolerance it is stable.  He is undergoing pulmonary rehabilitation and is using 3-4 L of oxygen with exertion.  He is still on the waiting list for a portable system.  He is willing to be seen by the transplant team for pulmonary fibrosis..    In terms of his rheumatoid arthritis he is on Imuran he is on a prednisone taper but still having pain.  He had her steroid injections.  He is not on Plaquenil.  I have encouraged him to talk to his rheumatologist about it.  Overall stable  Overall symptom score listed below.   OV 01/06/2020  Subjective:  Patient ID: Lawrence Wells, male , DOB: 30-Aug-1955 , age 72 y.o. , MRN: [id number] , ADDRESS: [address] [place] [address]   Follow-up rheumatoid arthritis-ILD/UIP pattern (based on high titer rheumatoid factor 09/10/2019, UIP on CT scan 09/14/2019 and  rheumatology visit confirming diagnosis of rheumatoid arthritis on 10/21/2019].  Started on nintedanib 10/12/2019  Associated emphysema present on CT scan January 2021  Normal cardiac stress test 10/14/2019  Rheumatoid arthritis on Imuran   01/06/2020 -     HPI Nolton E Dorado 64 y.o. -there is a telephone visit.  Patient identified with 2 person identifier.  Wife also came on the phone.  He tells me in the wife also attest that since starting nintedanib he has continued to lose weight.  Today the weight is 159 pounds.  Diarrhea has started despite taking Imodium once a day.  The diarrhea is severe as documented by a level 4.  He is undergoing pulmonary rehabilitation and this is helped dyspnea but he is dealing with significant pain from the rheumatoid arthritis.  He is undergoing transplant evaluation at [facility] and is quite busy with that.  He has upcoming appointment with me in mid June 2021 for a face-to-face for pulmonary function test and follow-up.  But at this point in time symptoms of weight loss and diarrhea are paramount.     OV 03/07/2020   Subjective:  Patient ID: Lawrence Wells, male , DOB: 10-13-1955, age 61 y.o. years. , MRN: [id number],  ADDRESS: [place] [address] PCP  Olin Hauser, DO Providers : Treatment Team:  Attending Provider: Brand Males, MD   Chief Complaint  Patient presents with  . Follow-up    cough in morning     Follow-up rheumatoid arthritis-ILD/UIP pattern (based on high titer rheumatoid factor 09/10/2019, UIP on CT scan 09/14/2019 and rheumatology visit confirming diagnosis of rheumatoid arthritis on 10/21/2019].  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 -presents for follow-up with his wife.   After his last visit he had COVID-19.  He was hospitalized.  I communicated with the hospitalist.  Since then he is recovered but is now left with a worse  than baseline.  He is now requiring 4 L nasal cannula with exertion even though this is an improvement is worse than his April 2021 baseline.  In terms of his rheumatoid arthritis he is on Imuran and prednisone.  This controls the pain.  However post Covid he developed some throat swelling.  Initially thought was sinus infection.  He saw primary care this then resulted in ENT evaluation.  Mid July 2020 when he has been diagnosed with sudden onset of right tonsillar cancer.  He has [employer] appointment pending.  Because of all this he is no longer a candidate for [facility] lung transplant services.  He is doing pulmonary rehabilitation.  Also because of all this he is lost significant amount of weight as documented below.  Is lost 13 pounds since April 2021.  He is asking for handicap placard.  Symptom scores are listed below.  Overall high symptom burden.  He and his wife are very frustrated by the turn of events.     OV 08/04/2020   Subjective:  Patient ID: Lawrence Wells, male , DOB: 1956/03/24, age 39 y.o. years. , MRN: [id number],  ADDRESS: [place] [address] PCP  Olin Hauser, DO Providers : Treatment Team:  Attending Provider: Brand Males, MD Patient Care Team: Olin Hauser, DO as PCP - General (Family Medicine)    Chief Complaint  Patient presents with  . Follow-up    C/o sob with exertion.      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 -returns for follow-up of his pulmonary fibrosis.  He has had a terrible year of 2021 when he is been diagnosed with pulmonary fibrosis and rheumatoid arthritis and then had Covid and then in the aftermath of Covid had right tonsillar cancer.  When I last saw him he had to go to [facility] to get chemo and radiation.  He is here with his wife.  He tells me he is finished chemo and radiation.  He says he lost 50 pounds but has gained a lot of the pounds back.  He has a PEG tube.  Because of the PEG tube and dysphagia he is not been able to take Eliquis or nintedanib.  Despite this he feels that his pulmonary fibrosis is stable.  He does not use his oxygen much.  He says he is lucky to have survived so far.  He continues Imuran for rheumatoid arthritis.  His joints feel stable.  His foot drop is improved.  Of note in the last 4 days he has developed a black coated tongue.  He is in touch with his [place] about this.  Review of his symptom score and desaturation test suggest that his ILD continues to be stable.  He and his wife are wondering when to restart his nintedanib.  He says he is slowly beginning to swallow pills and food.  I have asked him to wait for his PET scan in January 2022 and get it cleared by his cancer and ENT physicians.   SYMPTOM SCALE - ILD 12/02/2019 On ofev, immuran, rehab  wegt 163# 159# 01/06/2020  03/07/2020 150#, ofev, immuran, pred 7.5 and 4L [place] iwt with exertion.  New dx of R Tonsillar cancer 08/04/2020 144#  O2 use ra  ra ra  Shortness of Breath 0 -> 5 scale with 5 being worst (score 6 If unable to do)     At rest 0  0 0  Simple tasks - showers, clothes change, eating, shaving 3  5 0  Household (dishes, doing bed, laundry) 3  5 0  Shopping 4  5 0  Walking level at own pace 3.5  5 1   Walking up Stairs 4 4 6 1   Total (30-36) Dyspnea Score 18  26 2   How bad is your cough? 3 2 5  0  How bad is  your fatigue 3  4 2   How bad is nausea 0  4 0  How bad is vomiting?  0  0 0  How bad is diarrhea? 0 4 despite immodium 1 per day 2 0  How bad is anxiety? 2  2 5   How bad is depression 2  3 0  pain  2 0         Simple office walk 185 feet x  3 laps goal with forehead probe 09/10/2019  10/22/2019  .03/07/2020   08/04/2020   O2 used ra ra - walk, uses 2L portable at home with exerion ra ra  Number laps completed 3 3 3  attempted but did only 1/2 Did all 3  Comments about pace mod     Resting Pulse Ox/HR 98% and 74/min 95% and 71 99% and 85/min 95% and HR 91/min  Final Pulse Ox/HR 88% and 96/min 86% and 101 88% a and 97/mi 89% and 107%  Desaturated </= 88% yes yes    Desaturated <= 3% points Yes, 10 points Yes, 9 pon    Got Tachycardic >/= 90/min yes yes    Symptoms at end of test Yes "pretty heavy" yes    Miscellaneous comments Corrected 2L   Corrected with 4L [place]         ROS - per HPI    has a past medical history of Arthritis, Collagen vascular disease ([place]), COPD (chronic obstructive pulmonary disease) ([place]), Coronary artery disease, Dyspnea, Emphysema lung ([place]), Hyperlipidemia, and Pulmonary filariasis.   reports that he quit smoking about 14 years ago. His smoking use included cigarettes. He has a 45.00 pack-year smoking history. He quit smokeless tobacco use about 14 years ago.  His smokeless tobacco use included chew.  Past Surgical History:  Procedure Laterality Date  . SINUS SURGERY WITH INSTATRAK      Allergies  Allergen Reactions  . Atorvastatin Other (See Comments)    Myalgias    Immunization History  Administered Date(s) Administered  . Influenza, High Dose Seasonal PF 07/28/2020  . PFIZER SARS-COV-2 Vaccination 11/22/2019, 12/15/2019  . Pneumococcal Conjugate-13 09/08/2019  . Tdap 04/19/2015, 05/07/2017  . Zoster Recombinat (Shingrix) 09/29/2019, 11/27/2019    Family History  Problem Relation Age of Onset  . Heart disease Mother   . Heart  attack Mother 74  . Arthritis Mother   . Heart disease Father   . Heart attack Father 62  . Healthy Sister   . Hyperlipidemia Brother   . Throat cancer Brother 75  . Heart disease Brother   . Healthy Son   . Healthy Daughter      Current Outpatient Medications:  .  albuterol (VENTOLIN HFA) 108 (90 Base) MCG/ACT inhaler, INHALE 2 PUFFS  INTO THE LUNGS EVERY 4 HOURS AS NEEDED FOR WHEEZING OR SHORTNESS OF BREATH OR COUGH, Disp: , Rfl:  .  azaTHIOprine (IMURAN) 50 MG tablet, Take 2 tablets (100 mg total) by mouth daily., Disp: 180 tablet, Rfl: 0 .  diclofenac Sodium (VOLTAREN) 1 % GEL, Apply 2 g topically 3 (three) times daily as needed., Disp: 100 g, Rfl: 2 .  fluticasone (FLONASE) 50 MCG/ACT nasal spray, Place 2 sprays into both nostrils daily. Use for 4-6 weeks then stop and use seasonally or as needed., Disp: 16 g, Rfl: 3 .  Multiple Vitamin (MULTIVITAMIN) tablet, Take 1 tablet by mouth daily., Disp: , Rfl:  .  OXYGEN, Inhale into the lungs as needed., Disp: , Rfl:  .  valACYclovir (VALTREX) 500 MG tablet, Take 1 tablet (500 mg total) by mouth 2 (two) times daily as needed (herpes flare). For 3-7 days as needed for flare, Disp: 30 tablet, Rfl: 1 .  apixaban (ELIQUIS) 5 MG TABS tablet, Take 1 tablet (5 mg total) by mouth 2 (two) times daily. (Patient not taking: Reported on 08/04/2020), Disp: 180 tablet, Rfl: 3 .  Nintedanib (OFEV) 150 MG CAPS, Please take 1 tablet oral daily for 1 week, then resume back to 1 tablet twice daily after  that (Patient not taking: Reported on 08/04/2020), Disp: 60 capsule, Rfl: 11 .  pantoprazole (PROTONIX) 40 MG tablet, Take 1 tablet (40 mg total) by mouth daily., Disp: 30 tablet, Rfl: 1 .  Tiotropium Bromide Monohydrate (SPIRIVA RESPIMAT) 2.5 MCG/ACT AERS, Inhale 2 puffs into the lungs daily. (Patient not taking: Reported on 08/04/2020), Disp: 1 g, Rfl: 5      Objective:   Vitals:   08/04/20 1438  BP: 100/60  Pulse: 74  Temp: 98.2 F (36.8 C)   TempSrc: Temporal  SpO2: 95%  Weight: 144 lb 12.8 oz (65.7 kg)  Height: 5\' 11"  (1.803 m)    Estimated body mass index is 20.2 kg/m as calculated from the following:   Height as of this encounter: 5\' 11"  (1.803 m).   Weight as of this encounter: 144 lb 12.8 oz (65.7 kg).  @WEIGHTCHANGE @  [employer]   08/04/20 1438  Weight: 144 lb 12.8 oz (65.7 kg)     Physical Exam   General: No distress. Looks better,. LEaner than before but has gained eight Neuro: Alert and Oriented x 3. GCS 15. Speech normal Psych: Pleasant Resp:  Barrel Chest - no.  Wheeze - no, Crackles - crackles at base, No overt respiratory distress CVS: Normal heart sounds. Murmurs - no Ext: Stigmata of Connective Tissue Disease - no HEENT: Normal upper airway. PEERL +. No post nasal drip        Assessment:       ICD-10-CM   1. Interstitial lung disease due to connective tissue disease ([place])  J84.89    M35.9   2. Tonsillar cancer ([place])  C09.9   3. Dysphagia, unspecified type  R13.10   4. Weight loss  R63.4        Plan:     Patient Instructions     ICD-10-CM   1. Interstitial lung disease due to connective tissue disease ([place])  J84.89    M35.9   2. Tonsillar cancer ([place])  C09.9   3. Dysphagia, unspecified type  R13.10   4. Weight loss  R63.4    Pulmonary fibrosis itself appears to be stable and I am glad for it  Noted that you are not able to take nintedanib because of  swallowing difficulties associated with tonsillar cancer  Glad that you are putting of a good fight against the cancer  Plan -Continue observation supportive care for pulmonary fibrosis  -In January 2022 once the PET scan is done please check with yourr cancer Dr. And your nose throat doctor if it is safe to swallow nintedanib -.  And then you can start nintedanib  -Make sure liver function test prior to that is normal  (your doctor skin check that]  -In February 20 07 November 2020 -do spirometry and DLCO  - talk to your  [facility] doctors about "black coated tongue"  Follow-up -Return to see Dr. Chase Caller in February/March 2022 for 30-minute visit -either [place] or [place]     SIGNATURE    Dr. Brand Males, M.D., F.C.C.P,  Pulmonary and Critical Care Medicine Staff Physician, [place] Director - Interstitial Lung Disease  Program  Pulmonary [place] at [place], [place], [address]  Pager: [phone], If no answer or between  15:00h - 7:00h: call [phone] Telephone: [phone]  3:03 PM 08/04/2020

## 2020-08-07 DIAGNOSIS — H43812 Vitreous degeneration, left eye: Secondary | ICD-10-CM | POA: Diagnosis not present

## 2020-08-07 DIAGNOSIS — H0288B Meibomian gland dysfunction left eye, upper and lower eyelids: Secondary | ICD-10-CM | POA: Diagnosis not present

## 2020-08-07 DIAGNOSIS — H2513 Age-related nuclear cataract, bilateral: Secondary | ICD-10-CM | POA: Diagnosis not present

## 2020-08-07 DIAGNOSIS — H0288A Meibomian gland dysfunction right eye, upper and lower eyelids: Secondary | ICD-10-CM | POA: Diagnosis not present

## 2020-08-10 ENCOUNTER — Other Ambulatory Visit: Payer: Self-pay

## 2020-08-10 MED ORDER — APIXABAN 5 MG PO TABS: 5.0000 mg | ORAL_TABLET | Freq: Two times a day (BID) | ORAL | 1 refills | Status: DC

## 2020-08-10 NOTE — Telephone Encounter (Signed)
Eliquis 5mg  refill request received. Patient is 64 years old, weight-65.7kg, Crea-0.76 on 02/08/2020, Diagnosis-Afib, and last seen by Dr. Rockey Situ on 03/27/2020. Dose is appropriate based on dosing criteria. Will send in refill to requested pharmacy.

## 2020-08-14 ENCOUNTER — Other Ambulatory Visit: Payer: Self-pay | Admitting: Family Medicine

## 2020-08-14 DIAGNOSIS — M159 Polyosteoarthritis, unspecified: Secondary | ICD-10-CM

## 2020-08-14 MED ORDER — GABAPENTIN 100 MG PO CAPS: 300.0000 mg | ORAL_CAPSULE | Freq: Every day | ORAL | 1 refills | Status: DC

## 2020-08-19 DIAGNOSIS — R1310 Dysphagia, unspecified: Secondary | ICD-10-CM | POA: Diagnosis not present

## 2020-08-23 DIAGNOSIS — R1310 Dysphagia, unspecified: Secondary | ICD-10-CM | POA: Diagnosis not present

## 2020-08-30 DIAGNOSIS — E038 Other specified hypothyroidism: Secondary | ICD-10-CM | POA: Diagnosis not present

## 2020-08-30 DIAGNOSIS — C099 Malignant neoplasm of tonsil, unspecified: Secondary | ICD-10-CM | POA: Diagnosis not present

## 2020-08-30 DIAGNOSIS — Z79891 Long term (current) use of opiate analgesic: Secondary | ICD-10-CM | POA: Diagnosis not present

## 2020-08-30 DIAGNOSIS — J8410 Pulmonary fibrosis, unspecified: Secondary | ICD-10-CM | POA: Diagnosis not present

## 2020-08-30 DIAGNOSIS — K1233 Oral mucositis (ulcerative) due to radiation: Secondary | ICD-10-CM | POA: Diagnosis not present

## 2020-08-30 DIAGNOSIS — C109 Malignant neoplasm of oropharynx, unspecified: Secondary | ICD-10-CM | POA: Diagnosis not present

## 2020-08-30 DIAGNOSIS — Z08 Encounter for follow-up examination after completed treatment for malignant neoplasm: Secondary | ICD-10-CM | POA: Diagnosis not present

## 2020-08-30 DIAGNOSIS — Z931 Gastrostomy status: Secondary | ICD-10-CM | POA: Diagnosis not present

## 2020-08-30 DIAGNOSIS — C760 Malignant neoplasm of head, face and neck: Secondary | ICD-10-CM | POA: Diagnosis not present

## 2020-08-30 DIAGNOSIS — Z923 Personal history of irradiation: Secondary | ICD-10-CM | POA: Diagnosis not present

## 2020-08-30 DIAGNOSIS — J358 Other chronic diseases of tonsils and adenoids: Secondary | ICD-10-CM | POA: Diagnosis not present

## 2020-08-30 DIAGNOSIS — Z85819 Personal history of malignant neoplasm of unspecified site of lip, oral cavity, and pharynx: Secondary | ICD-10-CM | POA: Diagnosis not present

## 2020-08-30 DIAGNOSIS — R1310 Dysphagia, unspecified: Secondary | ICD-10-CM | POA: Diagnosis not present

## 2020-09-12 DIAGNOSIS — L718 Other rosacea: Secondary | ICD-10-CM | POA: Diagnosis not present

## 2020-09-12 DIAGNOSIS — H43812 Vitreous degeneration, left eye: Secondary | ICD-10-CM | POA: Diagnosis not present

## 2020-09-21 ENCOUNTER — Telehealth: Payer: Self-pay | Admitting: Internal Medicine

## 2020-09-21 DIAGNOSIS — Z79899 Other long term (current) drug therapy: Secondary | ICD-10-CM | POA: Diagnosis not present

## 2020-09-21 DIAGNOSIS — M0579 Rheumatoid arthritis with rheumatoid factor of multiple sites without organ or systems involvement: Secondary | ICD-10-CM | POA: Diagnosis not present

## 2020-09-21 DIAGNOSIS — J849 Interstitial pulmonary disease, unspecified: Secondary | ICD-10-CM | POA: Diagnosis not present

## 2020-09-21 DIAGNOSIS — C099 Malignant neoplasm of tonsil, unspecified: Secondary | ICD-10-CM | POA: Diagnosis not present

## 2020-09-22 NOTE — Telephone Encounter (Signed)
Submitted a Prior Authorization request to [facility] for [place] via Cover My Meds. Will update once we receive a response.   Key: [id number] - [place] Case ID: [id number]

## 2020-09-22 NOTE — Telephone Encounter (Signed)
Received notification from [facility] regarding a prior authorization for [place]. Authorization has been APPROVED from 09/22/20 to 09/22/21.   Authorization #  [id number]

## 2020-09-22 NOTE — Telephone Encounter (Signed)
Called [facility] and advised. They will reach out to patient to schedule shipment.

## 2020-09-23 NOTE — Progress Notes (Unsigned)
Cardiology Office Note  Date:  09/27/2020   ID:  Lawrence Wells, DOB 02-03-1956, MRN [id number]  PCP:  Olin Hauser, DO   Chief Complaint  Patient presents with  . Other    6 month follow up. Meds reviewed verbally with patient.     HPI:  [name] is a 65 yo male with PMH of  Hyperlipidemia, notes indicating intolerance of Lipitor and Crestor, able to tolerate Zetia Former smoker stopped 2017 ILD/Pulmonary fibrosis, chronic shortness of breath Possible lung transplant candidate, previously evaluated at [facility] June 2021 on Eliquis Is for follow-up of his coronary calcification on CT scan, paroxysmal atrial fibrillation  Last seen in clinic August 2021 History of diffuse ILD along with left upper lobe nodule.   Followed by pulmonary On oxygen, some coughing  History of right tonsillar squamous cell carcinoma, chemoradiation On prior visits had weight loss 35 pounds, difficulty swallowing  Weight in aug 2021 145 pounds Could not eat, " I was dying" Weight down to 125, had a feeding tube placed Cancer treatment completed  Now Weight 145,  Would like to get into see speech and swallow, having some difficulty with bread, certain food items Muscles feel weak Would prefer to do it locally, rather than drive to [facility]  EKG personally reviewed by myself on todays visit Normal sinus rhythm 74 bpm left axis deviation no significant ST or T wave changes   Other past medical history reviewed CT scan chest 09/14/2019 Minimal carotid and aortic atherosclerosis noted  moderate diffuse coronary calcification noted LAD and RCA   echocardiogram with ejection fraction 50%, moderate RV systolic dysfunction Anterior wall hypokinesis,   PMH:   has a past medical history of Arthritis, Collagen vascular disease ([place]), COPD (chronic obstructive pulmonary disease) ([place]), Coronary artery disease, Dyspnea, Emphysema lung ([place]), Hyperlipidemia, and Pulmonary filariasis.  PSH:     Past Surgical History:  Procedure Laterality Date  . SINUS SURGERY WITH INSTATRAK      Current Outpatient Medications  Medication Sig Dispense Refill  . azaTHIOprine (IMURAN) 50 MG tablet Take 2 tablets (100 mg total) by mouth daily. 180 tablet 0  . predniSONE (DELTASONE) 5 MG tablet Take 5 mg by mouth daily.     No current facility-administered medications for this visit.     Allergies:   Atorvastatin   Social History:  The patient  reports that he quit smoking about 15 years ago. His smoking use included cigarettes. He has a 45.00 pack-year smoking history. He quit smokeless tobacco use about 15 years ago.  His smokeless tobacco use included chew. He reports previous alcohol use. He reports that he does not use drugs.   Family History:   family history includes Arthritis in his mother; Healthy in his daughter, sister, and son; Heart attack (age of onset: 23) in his father; Heart attack (age of onset: 54) in his mother; Heart disease in his brother, father, and mother; Hyperlipidemia in his brother; Throat cancer (age of onset: 55) in his brother.    Review of Systems: Review of Systems  Constitutional: Negative.   HENT: Negative.   Respiratory: Positive for shortness of breath.   Cardiovascular: Negative.   Gastrointestinal:       Throat pain  Musculoskeletal: Negative.   Neurological: Negative.   Psychiatric/Behavioral: Negative.   All other systems reviewed and are negative.   PHYSICAL EXAM: VS:  BP 112/60 (BP Location: Left Arm, Patient Position: Sitting, Cuff Size: Normal)   Pulse 74  Ht 5\' 11"  (1.803 m)   Wt 145 lb (65.8 kg)   SpO2 95%   BMI 20.22 kg/m  , BMI Body mass index is 20.22 kg/m. Constitutional:  oriented to person, place, and time. No distress.  HENT: Fullness in the neck more on the right Head: Grossly normal Eyes:  no discharge. No scleral icterus.  Neck: No JVD, no carotid bruits  Cardiovascular: Regular rate and rhythm, no murmurs  appreciated Pulmonary/Chest: Scattered Rales Abdominal: Soft.  no distension.  no tenderness.  Musculoskeletal: Normal range of motion Neurological:  normal muscle tone. Coordination normal. No atrophy Skin: Skin warm and dry Psychiatric: normal affect, pleasant    Recent Labs: 01/21/2020: B Natriuretic Peptide 271.4 01/23/2020: Hemoglobin 13.1; Platelets 472 02/08/2020: ALT 17; BUN 32; Creatinine, Ser 0.76; Pro B Natriuretic peptide (BNP) 122.0; Sodium 135 03/27/2020: Potassium 4.7    Lipid Panel Lab Results  Component Value Date   CHOL 210 (H) 09/02/2019   HDL 51 09/02/2019   LDLCALC 134 (H) 09/02/2019   TRIG 139 09/02/2019      Wt Readings from Last 3 Encounters:  09/27/20 145 lb (65.8 kg)  08/04/20 144 lb 12.8 oz (65.7 kg)  05/03/20 132 lb (59.9 kg)     ASSESSMENT AND PLAN:  Problem List Items Addressed This Visit      Cardiology Problems   Hypercholesterolemia   Relevant Orders   Lipid panel   Coronary artery calcification     Other   Centrilobular emphysema (HCC)   Relevant Medications   predniSONE (DELTASONE) 5 MG tablet   Pulmonary fibrosis (HCC)   Former smoker    Other Visit Diagnoses    PAF (paroxysmal atrial fibrillation) ([place])    -  Primary   Relevant Orders   EKG 12-Lead     COPD/Pulmonary fibrosis, Previously on oxygen Prior transplant listing delayed secondary to cancer diagnosis and treatment  Coronary artery disease Prior CT scan showing Heavy calcification LAD, RCA, some in the left circumflex Minimal in the aorta and carotids Prior stress test early 2021 with no ischemia Denies any anginal symptoms Some concern for hypokinesis anterior wall on echocardiogram at [facility] Dramatic weight loss, all of his medications held including Zetia -As he continues to recover, we will look to restart his Zetia  Hyperlipidemia Unable to tolerate Crestor Did not tolerate Lipitor,  Zetia held in the setting of protein calorie malnutrition, failure to  thrive As he recovers and weight trends back up, we will look to restart the Zetia  Paroxysmal atrial fibrillation Prior episode atrial fibrillation June 2021 in the setting of COVID, respiratory distress, Appears to be lone episode, Eliquis held in the setting of protein calorie malnutrition, weight down to 125 pounds We will hold off on restarting Eliquis at this time, he will do some home monitoring of heart rate, close monitoring for atrial fibrillation Has trouble swallowing pills, scheduled to see speech and swallow    Total encounter time more than 25 minutes  Greater than 50% was spent in counseling and coordination of care with the patient   Signed, Esmond Plants, M.D., Ph.D. [place], [place]

## 2020-09-27 ENCOUNTER — Ambulatory Visit (INDEPENDENT_AMBULATORY_CARE_PROVIDER_SITE_OTHER): Payer: Medicare HMO | Admitting: Cardiovascular Disease

## 2020-09-27 ENCOUNTER — Other Ambulatory Visit: Payer: Self-pay

## 2020-09-27 ENCOUNTER — Encounter: Payer: Self-pay | Admitting: Cardiovascular Disease

## 2020-09-27 VITALS — BP 112/60 | HR 74 | Ht 71.0 in | Wt 145.0 lb

## 2020-09-27 DIAGNOSIS — J432 Centrilobular emphysema: Secondary | ICD-10-CM

## 2020-09-27 DIAGNOSIS — E7800 Pure hypercholesterolemia, unspecified: Secondary | ICD-10-CM | POA: Diagnosis not present

## 2020-09-27 DIAGNOSIS — I480 Paroxysmal atrial fibrillation: Secondary | ICD-10-CM

## 2020-09-27 DIAGNOSIS — I2510 Atherosclerotic heart disease of native coronary artery without angina pectoris: Secondary | ICD-10-CM | POA: Diagnosis not present

## 2020-09-27 DIAGNOSIS — I2584 Coronary atherosclerosis due to calcified coronary lesion: Secondary | ICD-10-CM

## 2020-09-27 DIAGNOSIS — Z87891 Personal history of nicotine dependence: Secondary | ICD-10-CM

## 2020-09-27 DIAGNOSIS — J8410 Pulmonary fibrosis, unspecified: Secondary | ICD-10-CM | POA: Diagnosis not present

## 2020-09-27 NOTE — Patient Instructions (Addendum)
Medication Instructions:  Please continue your current medications, there are no changes at this. If you need any refills on your cardiac medications, please reach out to your pharmacy, they will send in request.  Lab work: Lipid panel  (You will see you results in your MyChart the same time the doctor receives the results, they need time to review results and make recommendations, you can expect a call a few days after your test)   Testing/Procedures: No new testing needed   Follow-Up:  . You will need a follow up appointment in 6 months  . Providers on your designated Care Team:   . Murray Hodgkins, NP . Christell Faith, PA-C . Marrianne Mood, PA-C    COVID-19 Vaccine Information can be found at: ShippingScam.co.uk For questions related to vaccine distribution or appointments, please email vaccine@[email or web address] .com or call [phone].

## 2020-09-28 LAB — LIPID PANEL
Chol/HDL Ratio: 4.1 ratio (ref 0.0–5.0)
Cholesterol, Total: 229 mg/dL — ABNORMAL HIGH (ref 100–199)
HDL: 56 mg/dL (ref >39)
LDL Chol Calc (NIH): 137 mg/dL — ABNORMAL HIGH (ref 0–99)
Triglycerides: 203 mg/dL — ABNORMAL HIGH (ref 0–149)
VLDL Cholesterol Cal: 36 mg/dL (ref 5–40)

## 2020-10-02 ENCOUNTER — Telehealth: Payer: Self-pay

## 2020-10-02 MED ORDER — EZETIMIBE 10 MG PO TABS
10.0000 mg | ORAL_TABLET | Freq: Every day | ORAL | 3 refills | Status: DC
Start: 2020-10-02 — End: 2021-09-03

## 2020-10-02 NOTE — Telephone Encounter (Signed)
Able to reach pt regarding his recent lab work, provider had a chance to review his results and advised "Cholesterol elevated  Would restart zetia 10 mg daily" Went over levels with pt, pt verbalized understanding, has tried Zetia in past, okay to restart. Medication sent to [facility] mail order service. All questions or concerns were address and no additional concerns at this time. Agreeable to plan, will call back for anything further.

## 2020-10-04 DIAGNOSIS — R1310 Dysphagia, unspecified: Secondary | ICD-10-CM | POA: Diagnosis not present

## 2020-10-04 DIAGNOSIS — C024 Malignant neoplasm of lingual tonsil: Secondary | ICD-10-CM | POA: Diagnosis not present

## 2020-10-04 DIAGNOSIS — R1312 Dysphagia, oropharyngeal phase: Secondary | ICD-10-CM | POA: Diagnosis not present

## 2020-10-13 ENCOUNTER — Ambulatory Visit: Payer: PPO | Admitting: Neurology

## 2020-10-19 DIAGNOSIS — J849 Interstitial pulmonary disease, unspecified: Secondary | ICD-10-CM | POA: Diagnosis not present

## 2020-11-01 ENCOUNTER — Telehealth: Payer: Self-pay | Admitting: Internal Medicine

## 2020-11-01 DIAGNOSIS — M359 Systemic involvement of connective tissue, unspecified: Secondary | ICD-10-CM

## 2020-11-01 DIAGNOSIS — J8489 Other specified interstitial pulmonary diseases: Secondary | ICD-10-CM

## 2020-11-01 NOTE — Telephone Encounter (Signed)
Called and spoke to patient.  Patient would like an order placed to adapt to d/c oxygen. He has not used oxygen in months. He is monitoring his oxygen levels throughout the day. spo2 is maintaining between 94-98%.   Dr. Chase Caller, please advise. Thanks

## 2020-11-02 DIAGNOSIS — R1312 Dysphagia, oropharyngeal phase: Secondary | ICD-10-CM | POA: Diagnosis not present

## 2020-11-02 NOTE — Telephone Encounter (Signed)
Ok to dc o2. Please ensure followup with me per recent OV. Can see him in [place]

## 2020-11-02 NOTE — Telephone Encounter (Signed)
Patient is aware that a message has been sent to MR, however he is currently on vacation. He is okay with waiting for a response.

## 2020-11-02 NOTE — Telephone Encounter (Signed)
Pt returning a phone about getting o2 d/c. Patient can be reached at [phone]

## 2020-11-03 NOTE — Telephone Encounter (Signed)
ATC patient--unable to leave vm due to mailbox not being setup.  °

## 2020-11-07 NOTE — Telephone Encounter (Signed)
Patient is aware of below message.  Order has been placed to adapt to d/c oxygen. appt scheduled for 12/13/2020 at 10:30. Patient is agreeable with traveling to [place]. Nothing further needed at this time.

## 2020-11-10 ENCOUNTER — Telehealth: Payer: Self-pay | Admitting: Internal Medicine

## 2020-11-10 NOTE — Telephone Encounter (Signed)
That is fine. I will see him without PFT

## 2020-11-10 NOTE — Telephone Encounter (Signed)
I was trying to get this patient scheduled for his PFT appt before he sees Dr. Chase Caller on 12/13/2020.  He was willing to do the PFT at [facility] until he found out that he would have to get the Covid Test before the PFT.  He stated that he was not doing any more Covid Test he hasn't had covid and hasn't been around anyone that has covid

## 2020-11-28 DIAGNOSIS — H9209 Otalgia, unspecified ear: Secondary | ICD-10-CM | POA: Diagnosis not present

## 2020-11-28 DIAGNOSIS — R634 Abnormal weight loss: Secondary | ICD-10-CM | POA: Diagnosis not present

## 2020-11-28 DIAGNOSIS — C099 Malignant neoplasm of tonsil, unspecified: Secondary | ICD-10-CM | POA: Diagnosis not present

## 2020-11-28 DIAGNOSIS — R432 Parageusia: Secondary | ICD-10-CM | POA: Diagnosis not present

## 2020-11-28 DIAGNOSIS — H9190 Unspecified hearing loss, unspecified ear: Secondary | ICD-10-CM | POA: Diagnosis not present

## 2020-11-28 DIAGNOSIS — K117 Disturbances of salivary secretion: Secondary | ICD-10-CM | POA: Diagnosis not present

## 2020-11-28 DIAGNOSIS — Z681 Body mass index (BMI) 19 or less, adult: Secondary | ICD-10-CM | POA: Diagnosis not present

## 2020-11-28 DIAGNOSIS — Z923 Personal history of irradiation: Secondary | ICD-10-CM | POA: Diagnosis not present

## 2020-11-28 DIAGNOSIS — Z9221 Personal history of antineoplastic chemotherapy: Secondary | ICD-10-CM | POA: Diagnosis not present

## 2020-11-28 DIAGNOSIS — Z08 Encounter for follow-up examination after completed treatment for malignant neoplasm: Secondary | ICD-10-CM | POA: Diagnosis not present

## 2020-12-13 ENCOUNTER — Other Ambulatory Visit: Payer: Self-pay

## 2020-12-13 ENCOUNTER — Ambulatory Visit (INDEPENDENT_AMBULATORY_CARE_PROVIDER_SITE_OTHER): Payer: Medicare HMO | Admitting: Internal Medicine

## 2020-12-13 ENCOUNTER — Encounter: Payer: Self-pay | Admitting: Internal Medicine

## 2020-12-13 VITALS — BP 118/68 | HR 63 | Temp 98.1°F | Ht 71.0 in | Wt 135.4 lb

## 2020-12-13 DIAGNOSIS — R634 Abnormal weight loss: Secondary | ICD-10-CM

## 2020-12-13 DIAGNOSIS — M359 Systemic involvement of connective tissue, unspecified: Secondary | ICD-10-CM

## 2020-12-13 DIAGNOSIS — R0902 Hypoxemia: Secondary | ICD-10-CM | POA: Diagnosis not present

## 2020-12-13 DIAGNOSIS — C099 Malignant neoplasm of tonsil, unspecified: Secondary | ICD-10-CM

## 2020-12-13 DIAGNOSIS — J8489 Other specified interstitial pulmonary diseases: Secondary | ICD-10-CM | POA: Diagnosis not present

## 2020-12-13 DIAGNOSIS — J849 Interstitial pulmonary disease, unspecified: Secondary | ICD-10-CM | POA: Diagnosis not present

## 2020-12-13 NOTE — Patient Instructions (Addendum)
Interstitial lung disease due to connective tissue disease (HCC)  -Clinically stable with minimal symptoms but you still have exertional oxygen  Plan - Respect refusal to do portable oxygen system with exertion -Continue being active -Continue Imuran for rheumatoid arthritis which also has beneficial effect in ILD -Continue to avoid nintedanib [since May 2021] because of unintentional ongoing weight loss -Do high-resolution CT chest supine and prone in 4-6 months [last 1 in summer 2021] -Do spirometry and DLCO in 4 to 6 months [last was in January 2021]  Weight loss -initially due to cancer and rheumatoid arthritis.  Currently due to being physically active  Plan - Continue to monitor this and follow with primary care physician  Tonsillar cancer [facility])  -Noted that it was in remission as of January 2022 and glad PEG tube was removed February 2022  Plan - Follow-up with [facility] oncology per their protocol   Follow-up - Return in 4-6 months in a 30-minute office visit slot with Dr. Chase Caller in [place] but after completing breathing test and CT scan

## 2020-12-13 NOTE — Addendum Note (Signed)
Addended by: Lorretta Harp on: 12/13/2020 11:20 AM   Modules accepted: Orders

## 2020-12-13 NOTE — Progress Notes (Signed)
OV 09/10/2019  Subjective:  Patient ID: Lawrence Wells, male , DOB: 29-Dec-1955 , age 66 y.o. , MRN: [id number] , ADDRESS: [place] [address]   09/10/2019 -   Chief Complaint  Patient presents with  . pulmonary consult    per Dr. Parks Ranger- CXR 09/08/2019. pt reports of sob with exertion, talking and bending, prod cough with yellow mucus, left sided chest discomfort  and wheezing.     HPI Lawrence Wells 65 y.o. -referred for interstitial lung disease after a chest x-ray from September 08, 2019 that I personally visualized and interpreted shows diffuse ILD along with left upper lobe nodule.  Patient has wife tell me that he has had insidious onset of shortness of breath for a year with progression in the last few months.  They also tell me that few years ago he had an chest x-ray done for an incidental unrelated reason that showed chronic changes of scarring but apparently were reassured.  In review of his chart and noticed that in 2005 this is chest x-ray done in the [facility] system with reports of ILD in it.  He is unaware of that.  History for this visit is given by the patient and his wife and review of the chart.  Integrated Comprehensive ILD Questionnaire  Symptoms:   Insidious onset of shortness of breath for the last year getting worse in the last 3 to 4 months.  Definitely no dyspnea a few years ago even though chest x-ray from 2005 was reported as ILD.  Sometimes dyspnea is episodic but mostly with exertion relieved by rest.  Symptom severity is listed below.  There is associated significant cough.  Cough started in October 2020.  It is getting worse cough is rated as moderate to severe.  He coughs at night.  He does bring up some yellow phlegm.  It is worse when he lies down.Marland Kitchen  He does clear his throat and he does feel a tickle in the back of the throat.  There is also associated wheezing.       Past Medical History : He gives a history positive  for arthritis.  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  Denies any asthma or known COPD.  Denies heart failure.  Denies scleroderma any collagen vascular disease.  Denies diagnosis of acid reflux or hiatal hernia.  Denies obstructive sleep apnea.  Denies pulmonary hypertension.  Denies diabetes or thyroid disease or stroke or seizures.  Denies infectious mononucleosis.  Denies hepatitis.  Denies tuberculosis denies kidney disease.  Denies blood clots denies heart disease denies pleurisy.   ROS: Positive for fatigue and arthralgia.  Positive for acid reflux.  He went to check the box for [name].  Denies any GI symptoms or rash or ulcers  FAMILY HISTORY of LUNG DISEASE: Denies including COPD and pulmonary fibrosis   EXPOSURE HISTORY: Smokes cigarettes between 1983 and 2007 1 to 2 packs/day and then quit.  Did not smoke cigars did not smoke pipe.  Did not vape.  Never smoked marijuana or cocaine.  Never used intravenous drugs.   HOME and HOBBY DETAILS : [place].  The age of the home is 20 years.  There is no dampness in the living environment.  No mold or mildew.  Does not use  humidifier.  No CPAP use.  Does not use nebulizer.  No steam iron use.  No Jacuzzi.  No pet birds or parakeets in the house.  No pet gerbils.  No feather pillows.  No mold in the [facility] duct.  Does not play wind instruments.  Does not do any gardening.   OCCUPATIONAL HISTORY (122 questions) : Works in the Transport planner for 30 years.  Worked in [employer] and crawl spaces.  A lot of dust exposure.  During this time he worked in damp air-conditioned spaces.  He thinks he had asbestos exposure.  He also worked in the [employer].  He cleaned AC coils.  Otherwise history is negative   PULMONARY TOXICITY HISTORY (27 items): Entirely  negative.       ROS - per HPI    OV 10/22/2019  Subjective:  Patient ID: Lawrence Wells, male , DOB: Lawrence 11, 1957 , age 35 y.o. , MRN: [id number] , ADDRESS: [place] [id number]   10/22/2019 -   Chief Complaint  Patient presents with  . Follow-up    ILD Followup. SOB on exertion. Non prod cough. Pt denies any wheezing, fever, chills, or sweats.   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  Started on nintedanib 10/12/2019  Associated emphysema present on CT scan January 2021  Normal cardiac stress test 10/14/2019   HPI Lawrence Wells 66 y.o. -presents for follow-up with his wife.  At last saw him end of January 2021 for ILD evaluation.  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  He then saw a rheumatologist Dr. D yesterday 10/21/2019 and given a diagnosis of joint rheumatoid arthritis.  In the interim he also saw pulmonary nurse practitioner for a virtual visit and was started on nintedanib but she started [place] on 10/12/2019.  So far is tolerating it fine.  He is using oxygen only with exertion.  There is no deterioration in symptoms.  Yesterday the rheumatology visit Imuran has been recommended first rheumatoid arthritis.  The TPMT test has been done and the results are pending.  He does not have liver function tests checked after he started his nintedanib.  He and his wife have many questions about the future course of the disease and natural history and other management strategies.  Currently is free was not helping him but he is willing to take it because of the associated emphysema.   I personally visualized and reviewed the  CT and also interpreted the findings myself   OV 12/02/2019  Subjective:  Patient ID: Lawrence Wells, male , DOB: [birth year] , age 19 y.o. , MRN: [id number] , ADDRESS: [place] [address]   12/02/2019 -   Chief Complaint  Patient presents with  . Follow-up    Follow-up rheumatoid arthritis-ILD/UIP pattern (based on high titer rheumatoid factor 09/10/2019, UIP on CT scan 09/14/2019 and rheumatology visit confirming diagnosis of rheumatoid arthritis on 10/21/2019].  Started on nintedanib 10/12/2019  Associated emphysema present on CT scan January 2021  Normal cardiac stress test 10/14/2019  Rheumatoid arthritis on Imuran  HPI Lawrence Wells 65 y.o. -6 returns for follow-up with his wife to  the [place] clinic.  He is a Engineer, manufacturing systems.  He normally seen in the [place] clinic.  At this point in time he tells me that he is tolerating the nintedanib fine.  He needs a liver function test.  However he has lost weight 160 pounds.  He does not know the reason.  He is tolerating nintedanib fine.  He is on Imuran for his rheumatoid arthritis.  In terms of his effort tolerance it is stable.  He is undergoing pulmonary rehabilitation and is using 3-4 L of oxygen with exertion.  He is still on the waiting list for a portable system.  He is willing to be seen by the transplant team for pulmonary fibrosis..    In terms of his rheumatoid arthritis he is on Imuran he is on a prednisone taper but still having pain.  He had her steroid injections.  He is not on Plaquenil.  I have encouraged him to talk to his rheumatologist about it.  Overall stable  Overall symptom score listed below.   OV 01/06/2020  Subjective:  Patient ID: Lawrence Wells, male , DOB: 30-Aug-1955 , age 72 y.o. , MRN: [id number] , ADDRESS: [address] [place] [address]   Follow-up rheumatoid arthritis-ILD/UIP pattern (based on high titer rheumatoid factor 09/10/2019, UIP on CT scan 09/14/2019 and  rheumatology visit confirming diagnosis of rheumatoid arthritis on 10/21/2019].  Started on nintedanib 10/12/2019  Associated emphysema present on CT scan January 2021  Normal cardiac stress test 10/14/2019  Rheumatoid arthritis on Imuran   01/06/2020 -     HPI Lawrence Wells 65 y.o. -there is a telephone visit.  Patient identified with 2 person identifier.  Wife also came on the phone.  He tells me in the wife also attest that since starting nintedanib he has continued to lose weight.  Today the weight is 159 pounds.  Diarrhea has started despite taking Imodium once a day.  The diarrhea is severe as documented by a level 4.  He is undergoing pulmonary rehabilitation and this is helped dyspnea but he is dealing with significant pain from the rheumatoid arthritis.  He is undergoing transplant evaluation at [facility] and is quite busy with that.  He has upcoming appointment with me in mid June 2021 for a face-to-face for pulmonary function test and follow-up.  But at this point in time symptoms of weight loss and diarrhea are paramount.     OV 03/07/2020   Subjective:  Patient ID: Lawrence Wells, male , DOB: 10-13-1955, age 61 y.o. years. , MRN: [id number],  ADDRESS: [place] [address] PCP  Olin Hauser, DO Providers : Treatment Team:  Attending Provider: Brand Males, MD   Chief Complaint  Patient presents with  . Follow-up    cough in morning     Follow-up rheumatoid arthritis-ILD/UIP pattern (based on high titer rheumatoid factor 09/10/2019, UIP on CT scan 09/14/2019 and rheumatology visit confirming diagnosis of rheumatoid arthritis on 10/21/2019].  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 -presents for follow-up with his wife.   After his last visit he had COVID-19.  He was hospitalized.  I communicated with the hospitalist.  Since then he is recovered but is now left with a worse  than baseline.  He is now requiring 4 L nasal cannula with exertion even though this is an improvement is worse than his April 2021 baseline.  In terms of his rheumatoid arthritis he is on Imuran and prednisone.  This controls the pain.  However post Covid he developed some throat swelling.  Initially thought was sinus infection.  He saw primary care this then resulted in ENT evaluation.  Mid July 2020 when he has been diagnosed with sudden onset of right tonsillar cancer.  He has [employer] appointment pending.  Because of all this he is no longer a candidate for [facility] lung transplant services.  He is doing pulmonary rehabilitation.  Also because of all this he is lost significant amount of weight as documented below.  Is lost 13 pounds since April 2021.  He is asking for handicap placard.  Symptom scores are listed below.  Overall high symptom burden.  He and his wife are very frustrated by the turn of events.     OV 08/04/2020   Subjective:  Patient ID: Lawrence Wells, male , DOB: [birth year], age 29 y.o. years. , MRN: [id number],  ADDRESS: [place] [address] PCP  Olin Hauser, DO Providers : Treatment Team:  Attending Provider: Brand Males, MD Patient Care Team: Olin Hauser, DO as PCP - General (Family Medicine)    Chief Complaint  Patient presents with  . Follow-up    C/o sob with exertion.      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 -returns for follow-up of his pulmonary fibrosis.  He has had a terrible year of 2021 when he is been diagnosed with pulmonary fibrosis and rheumatoid arthritis and then had Covid and then in the aftermath of Covid had right tonsillar cancer.  When I last saw him he had to go to [facility] to get chemo and radiation.  He is here with his wife.  He tells me he is finished chemo and radiation.  He says he lost 50 pounds but has gained a lot of the pounds back.  He has a PEG tube.  Because of the PEG tube and dysphagia he is not been able to take Eliquis or nintedanib.  Despite this he feels that his pulmonary fibrosis is stable.  He does not use his oxygen much.  He says he is lucky to have survived so far.  He continues Imuran for rheumatoid arthritis.  His joints feel stable.  His foot drop is improved.  Of note in the last 4 days he has developed a black coated tongue.  He is in touch with his [place] about this.  Review of his symptom score and desaturation test suggest that his ILD continues to be stable.  He and his wife are wondering when to restart his nintedanib.  He says he is slowly beginning to swallow pills and food.  I have asked him to wait for his PET scan in January 2022 and get it cleared by his cancer and ENT physicians.       OV 12/13/2020  Subjective:  Patient ID: Lawrence Wells, male , DOB: [birth year] , age 60 y.o. , MRN: [id number] , ADDRESS: [place] [facility] [address] PCP Olin Hauser, DO Patient Care Team: Olin Hauser, DO as PCP - General (Family Medicine)  This Provider for this visit: Treatment Team:  Attending Provider: Brand Males, MD   Follow-up rheumatoid arthritis-ILD/UIP pattern (based on high titer rheumatoid factor 09/10/2019, UIP on CT scan 09/14/2019 and rheumatology visit confirming diagnosis of rheumatoid arthritis on 10/21/2019].   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 Follow-up    Pt states he has been doing well since last visit and denies any complaints. States he had his oxygen taken back by DME as he was not using it.     HPI Lawrence Wells 65 y.o. -returns for follow-up.  Last seen in December 2021.  At this point in time he is on Imuran for his rheumatoid arthritis which also serves for his connective tissue disease.  Since May of last he has not been able to take nintedanib because he continues to lose weight.  In the interim he did have tonsillar cancer and had dysphagia and could not actually swallow nintedanib.  His tonsillar cancer is now in remission since January 2022.  I reviewed the PET scan results at [facility] and copied and pasted below.  His PEG tube is off since February 2022.  He is eating well.  He is quite functional however he is continue to lose weight which he believes is because he is more active.  It is unintentional.  But he thinks that the healthy weight loss.  A few months ago he was 140s pounds in weight.  Now he is in his 130s.  He is not overly concerned about it but will monitor.  He is able to walk quite a bit and feels less short of breath.  Today when we walked him he did not desaturate but he refused oxygen.  His last PFT was in January 2021 last CT chest was in summer 2021.  At this  point in time he is content doing the Imuran.  He does not want oxygen.  He also [and I agree with him] does not want nintedanib.    SYMPTOM SCALE - ILD 12/02/2019 On ofev, immuran, rehab  wegt 163# 159# 01/06/2020  03/07/2020 150#, ofev, immuran, pred 7.5 and 4L [place] iwt with exertion.  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 Not on ofev   O2 use ra  ra ra ra  Shortness of Breath 0 -> 5 scale with 5 being worst (score 6 If unable to do)      At rest 0  0 0 0  Simple tasks - showers,  clothes change, eating, shaving 3  5 0 0  Household (dishes, doing bed, laundry) 3  5 0 0  Shopping 4  5 0 0  Walking level at own pace 3.5  5 1  0  Walking up Stairs 4 4 6 1 1   Total (30-36) Dyspnea Score 18  26 2 1   How bad is your cough? 3 2 5  0 0  How bad is your fatigue 3  4 2 1   How bad is nausea 0  4 0 0  How bad is vomiting?  0  0 0 0  How bad is diarrhea? 0 4 despite immodium 1 per day 2 0 0  How bad is anxiety? 2  2 5  0  How bad is depression 2  3 0 0  pain  2 0  0        Simple office walk 185 feet x  3 laps goal with forehead probe 09/10/2019  10/22/2019  .03/07/2020   08/04/2020  12/13/2020   O2 used ra ra - walk, uses 2L portable at home with exerion ra ra   Number laps completed 3 3 3  attempted but did only 1/2 Did all 3   Comments about pace mod      Resting Pulse Ox/HR 98% and 74/min 95% and 71 99% and 85/min 95% and HR 91/min 99% and HR 63  Final Pulse Ox/HR 88% and 96/min 86% and 101 88% a and 97/mi 89% and 107% 86% and HR 81  Desaturated </= 88% yes yes     Desaturated <= 3% points Yes, 10 points Yes, 9 pon     Got Tachycardic >/= 90/min yes yes     Symptoms at end of test Yes "pretty heavy" yes     Miscellaneous comments Corrected 2L   Corrected with 4L [place]  Refused o3    PFT  No flowsheet data found.  PET Scan Jan 2022  IMPRESSION:  No definite residual mass/tumor within the region of the right tonsil   extending alongthe mucosal surface into the right vallecula. No suspicious  lymphadenopathy is identified.   Electronically Reviewed by: Renae Gloss, MD, [facility]  Electronically Reviewed on: 08/30/2020 3:33 PM      has a past medical history of Arthritis, Collagen vascular disease ([place]), COPD (chronic obstructive pulmonary disease) ([place]), Coronary artery disease, Dyspnea, Emphysema lung ([place]), Hyperlipidemia, and Pulmonary filariasis.   reports that he quit smoking about 15 years ago. His smoking use included cigarettes. He has a 45.00 pack-year smoking history. He quit smokeless tobacco use about 15 years ago.  His smokeless tobacco use included chew.  Past Surgical History:  Procedure Laterality Date  . SINUS SURGERY WITH INSTATRAK      Allergies  Allergen Reactions  . Atorvastatin Other (See Comments)    Myalgias    Immunization History  Administered Date(s) Administered  . Influenza, High Dose Seasonal PF 07/28/2020  . PFIZER(Purple Top)SARS-COV-2 Vaccination 11/22/2019, 12/15/2019  . Pneumococcal Conjugate-13 09/08/2019  . Tdap 04/19/2015, 05/07/2017  . Zoster Recombinat (Shingrix) 09/29/2019, 11/27/2019    Family History  Problem Relation Age of Onset  . Heart disease Mother   . Heart attack Mother 52  . Arthritis Mother   . Heart disease Father   . Heart attack Father 34  . Healthy Sister   . Hyperlipidemia Brother   . Throat cancer Brother 26  . Heart disease Brother   . Healthy Son   . Healthy Daughter  Current Outpatient Medications:  .  azaTHIOprine (IMURAN) 50 MG tablet, Take 2 tablets (100 mg total) by mouth daily., Disp: 180 tablet, Rfl: 0 .  ezetimibe (ZETIA) 10 MG tablet, Take 1 tablet (10 mg total) by mouth daily., Disp: 90 tablet, Rfl: 3      Objective:   Vitals:   12/13/20 1031  BP: 118/68  Pulse: 63  Temp: 98.1 F (36.7 C)  TempSrc: Temporal  SpO2: 95%  Weight: 135 lb 6.4 oz (61.4 kg)  Height: 5\' 11"  (1.803 m)     Estimated body mass index is 18.88 kg/m as calculated from the following:   Height as of this encounter: 5\' 11"  (1.803 m).   Weight as of this encounter: 135 lb 6.4 oz (61.4 kg).  @WEIGHTCHANGE @  [employer]   12/13/20 1031  Weight: 135 lb 6.4 oz (61.4 kg)     Physical Exam   General: No distress. Leaner but looks healthier Neuro: Alert and Oriented x 3. GCS 15. Speech normal Psych: Pleasant Resp:  Barrel Chest - no.  Wheeze - no, Crackles - yes, No overt respiratory distress CVS: Normal heart sounds. Murmurs - no Ext: Stigmata of Connective Tissue Disease - clubbing some HEENT: Normal upper airway. PEERL +. No post nasal drip        Assessment:       ICD-10-CM   1. Interstitial lung disease due to connective tissue disease ([place])  J84.89    M35.9   2. Exercise hypoxemia  R09.02   3. Weight loss  R63.4   4. Tonsillar cancer ([place])  C09.9        Plan:     Patient Instructions  Interstitial lung disease due to connective tissue disease ([place])  -Clinically stable with minimal symptoms but you still have exertional oxygen  Plan - Respect refusal to do portable oxygen system with exertion -Continue being active -Continue Imuran for rheumatoid arthritis which also has beneficial effect in ILD -Continue to avoid nintedanib [since May 2021] because of unintentional ongoing weight loss -Do high-resolution CT chest supine and prone in 4-6 months [last 1 in summer 2021] -Do spirometry and DLCO in 4 to 6 months [last was in January 2021]  Weight loss -initially due to cancer and rheumatoid arthritis.  Currently due to being physically active  Plan - Continue to monitor this and follow with primary care physician  Tonsillar cancer [facility])  -Noted that it was in remission as of January 2022 and glad PEG tube was removed February 2022  Plan - Follow-up with [facility] oncology per their protocol   Follow-up - Return in 4-6 months in a 30-minute office visit slot with  Dr. Chase Caller in [place] but after completing breathing test and CT scan      SIGNATURE    Dr. Brand Males, M.D., F.C.C.P,  Pulmonary and Critical Care Medicine Staff Physician, [place] Director - Interstitial Lung Disease  Program  Pulmonary [place] at [place], [place], [address]  Pager: [phone], If no answer or between  15:00h - 7:00h: call [phone] Telephone: [phone]  11:10 AM 12/13/2020

## 2020-12-19 DIAGNOSIS — J849 Interstitial pulmonary disease, unspecified: Secondary | ICD-10-CM | POA: Diagnosis not present

## 2020-12-19 DIAGNOSIS — M0579 Rheumatoid arthritis with rheumatoid factor of multiple sites without organ or systems involvement: Secondary | ICD-10-CM | POA: Diagnosis not present

## 2020-12-19 DIAGNOSIS — Z79899 Other long term (current) drug therapy: Secondary | ICD-10-CM | POA: Diagnosis not present

## 2021-01-17 ENCOUNTER — Ambulatory Visit: Payer: Self-pay | Admitting: *Deleted

## 2021-01-17 NOTE — Telephone Encounter (Signed)
Pt called in c/o coughing up thick, sticky green mucus for the last 2-3 days, low grade fever, and chest pain.   "I've got this awful bad cough".   He has not done a Covid test.   He will do one today.  He has been having drainage down the back of his throat so started taking an allergy pill which has helped that. No cardiac history.   He does have pulmonary fibrosis.  After triaging him the chest pain is from the coughing and congestion so I made him an appt per the protocol to see Dr. Parks Ranger on 01/18/2021 at 8:00 in office.    I sent my notes to [facility]   Reason for Disposition . [1] Chest pain(s) lasting a few seconds from coughing AND [2] persists > 3 days  Answer Assessment - Initial Assessment Questions 1. LOCATION: "Where does it hurt?"       I'm having chest pain for 2-3 days around my left shoulder and left chest around my left breast.    He has pulmonary fibrosis.    No shortness of breath.  I have a real bad cough with mucus.   Taking a allergy pill for the drainage down the back of the throat.    No runny nose.   He is coughing up large wades of mucus it's green stuff.   Very sticky mucus.   99.8 temperature low grade  No muscles aches or diarrhea or vomiting.     2. RADIATION: "Does the pain go anywhere else?" (e.g., into neck, jaw, arms, back)     Left shoulder   3. ONSET: "When did the chest pain begin?" (Minutes, hours or days)      2-3 days 4. PATTERN "Does the pain come and go, or has it been constant since it started?"  "Does it get worse with exertion?"      No cardiac history.   The chest pain varies.    5. DURATION: "How long does it last" (e.g., seconds, minutes, hours)     On and off. 6. SEVERITY: "How bad is the pain?"  (e.g., Scale 1-10; mild, moderate, or severe)    - MILD (1-3): doesn't interfere with normal activities     - MODERATE (4-7): interferes with normal activities or awakens from sleep    - SEVERE (8-10): excruciating pain, unable  to do any normal activities       I get real cold and shaking shivering for a few minutes then it goes away.   7. CARDIAC RISK FACTORS: "Do you have any history of heart problems or risk factors for heart disease?" (e.g., angina, prior heart attack; diabetes, high blood pressure, high cholesterol, smoker, or strong family history of heart disease)     No 8. PULMONARY RISK FACTORS: "Do you have any history of lung disease?"  (e.g., blood clots in lung, asthma, emphysema, birth control pills)     Pulmonary fibrosis 9. CAUSE: "What do you think is causing the chest pain?"     I've been playing with the grand kids Monday picking them up. 10. OTHER SYMPTOMS: "Do you have any other symptoms?" (e.g., dizziness, nausea, vomiting, sweating, fever, difficulty breathing, cough)       No nausea or diarrhea.    I get dizzy when I get up from the couch or out of the bed.   This has been happening for 2-3 days.   I had throat cancer.   I'm always sipping  on fluids.   11. PREGNANCY: "Is there any chance you are pregnant?" "When was your last menstrual period?"       N/A  Protocols used: CHEST PAIN-A-AH

## 2021-01-18 ENCOUNTER — Other Ambulatory Visit: Payer: Self-pay

## 2021-01-18 ENCOUNTER — Encounter: Payer: Self-pay | Admitting: Family Medicine

## 2021-01-18 ENCOUNTER — Ambulatory Visit
Admission: RE | Admit: 2021-01-18 | Discharge: 2021-01-18 | Disposition: A | Discharge status: Home / Self Care | Payer: BC Managed Care – PPO | Source: Ambulatory Visit | Attending: Family Medicine | Admitting: Family Medicine

## 2021-01-18 ENCOUNTER — Ambulatory Visit
Admission: RE | Admit: 2021-01-18 | Discharge: 2021-01-18 | Disposition: A | Discharge status: Home / Self Care | Payer: Medicare HMO | Attending: Family Medicine | Admitting: Family Medicine

## 2021-01-18 ENCOUNTER — Other Ambulatory Visit: Payer: Self-pay | Admitting: Family Medicine

## 2021-01-18 ENCOUNTER — Ambulatory Visit (INDEPENDENT_AMBULATORY_CARE_PROVIDER_SITE_OTHER): Payer: Medicare HMO | Admitting: Family Medicine

## 2021-01-18 ENCOUNTER — Ambulatory Visit: Admission: RE | Admit: 2021-01-18 | Payer: BC Managed Care – PPO | Source: Home / Self Care

## 2021-01-18 ENCOUNTER — Ambulatory Visit
Admission: RE | Admit: 2021-01-18 | Discharge: 2021-01-18 | Disposition: A | Discharge status: Home / Self Care | Payer: Medicare HMO | Source: Ambulatory Visit | Attending: Family Medicine | Admitting: Family Medicine

## 2021-01-18 VITALS — BP 100/68 | HR 96 | Temp 99.0°F | Ht 71.0 in | Wt 131.4 lb

## 2021-01-18 DIAGNOSIS — R059 Cough, unspecified: Secondary | ICD-10-CM | POA: Diagnosis not present

## 2021-01-18 DIAGNOSIS — J441 Chronic obstructive pulmonary disease with (acute) exacerbation: Secondary | ICD-10-CM

## 2021-01-18 DIAGNOSIS — J849 Interstitial pulmonary disease, unspecified: Secondary | ICD-10-CM | POA: Diagnosis not present

## 2021-01-18 MED ORDER — PREDNISONE 20 MG PO TABS: ORAL_TABLET | ORAL | 0 refills | Status: DC

## 2021-01-18 MED ORDER — LEVOFLOXACIN 500 MG PO TABS: 500.0000 mg | ORAL_TABLET | Freq: Every day | ORAL | 0 refills | Status: DC

## 2021-01-18 NOTE — Patient Instructions (Addendum)
Thank you for coming to the office today.  Most likely COPD flare  Start Prednisone taper 7 day Start taking Levaquin antibiotic 500mg  daily x 7 days Use rescue inhaler albuterol Mucinex can help  Will hold off on Flu / COVID medication.  If not significantly improved by 1 week contact Pulm  If your symptoms seem to worsen instead of improve over next several days, including significant fever / chills, worsening shortness of breath, worsening wheezing, or nausea / vomiting and can't take medicines - return sooner or go to hospital Emergency Department for more immediate treatment.   Please schedule a Follow-up Appointment to: Return if symptoms worsen or fail to improve.  If you have any other questions or concerns, please feel free to call the office or send a message through [place]. You may also schedule an earlier appointment if necessary.  Additionally, you may be receiving a survey about your experience at our office within a few days to 1 week by e-mail or mail. We value your feedback.  Nobie Putnam, DO [place]

## 2021-01-18 NOTE — Progress Notes (Signed)
Subjective:    Patient ID: Lawrence Wells, male    DOB: 01-12-1956, 65 y.o.   MRN: [id number]  Lawrence Wells is a 65 y.o. 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 Recent onset acute illness symptoms 5 days ago, gradual worsening, then with low grade fever and body aches chills and productive sputum. Not on any supplemental oxygen He remains still active. Not having dyspnea with exertion. Followed by Pulmonology, Dr Chase Caller last seen 12/13/20. Has upcoming routine High Res CT scan in 03/2021. No recent x-ray or imaging for this current problem. Using albuterol inhaler regularly with some relief. Admits chest discomfort with coughing only.  Depression screen [facility] 2/9 12/08/2019 12/01/2019 10/28/2019  Decreased Interest 0 0 1  Down, Depressed, Hopeless 1 0 0  PHQ - 2 Score 1 0 1  Altered sleeping 1 1 2   Tired, decreased energy 1 1 1   Change in appetite 0 0 0  Feeling bad or failure about yourself  0 1 1  Trouble concentrating 0 0 0  Moving slowly or fidgety/restless 1 1 1   Suicidal thoughts 0 0 2  PHQ-9 Score 4 4 8   Difficult doing work/chores Not difficult at all - Not difficult at all    Social History   Tobacco Use  . Smoking status: Former Smoker    Packs/day: 1.50    Years: 30.00    Pack years: 45.00    Types: Cigarettes    Quit date: 2007    Years since quitting: 15.4  . Smokeless tobacco: Former Systems developer    Types: Chew    Quit date: 2007  . Tobacco comment: Dip smokeless tobacco >20-30 years  Vaping Use  . Vaping Use: Never used  Substance Use Topics  . Alcohol use: Not Currently    Comment: occ  . Drug use: No    Review of Systems Per HPI unless specifically indicated above     Objective:    BP 100/68   Pulse 96   Temp 99 F (37.2 C) (Oral)   Ht 5\' 11"  (1.803 m)   Wt 131 lb 6.4 oz (59.6 kg)   SpO2 99%   BMI 18.33 kg/m   Wt Readings  from Last 3 Encounters:  01/18/21 131 lb 6.4 oz (59.6 kg)  12/13/20 135 lb 6.4 oz (61.4 kg)  09/27/20 145 lb (65.8 kg)    Physical Exam Vitals and nursing note reviewed.  Constitutional:      General: He is not in acute distress.    Appearance: He is well-developed. He is not diaphoretic.     Comments: Well-appearing, comfortable, cooperative  HENT:     Head: Normocephalic and atraumatic.  Eyes:     General:        Right eye: No discharge.        Left eye: No discharge.     Conjunctiva/sclera: Conjunctivae normal.  Neck:     Thyroid: No thyromegaly.  Cardiovascular:     Rate and Rhythm: Normal rate and regular rhythm.     Heart sounds: Normal heart sounds. No murmur heard.   Pulmonary:     Effort: Pulmonary effort is normal. No respiratory distress.     Breath sounds: Normal breath sounds. No wheezing or rales.  Musculoskeletal:        General: Normal range of motion.     Cervical back: Normal range  of motion and neck supple.  Lymphadenopathy:     Cervical: No cervical adenopathy.  Skin:    General: Skin is warm and dry.     Findings: No erythema or rash.  Neurological:     Mental Status: He is alert and oriented to person, place, and time.  Psychiatric:        Behavior: Behavior normal.     Comments: Well groomed, good eye contact, normal speech and thoughts        Results for orders placed or performed in visit on 09/27/20  Lipid panel  Result Value Ref Range   Cholesterol, Total 229 (H) 100 - 199 mg/dL   Triglycerides 203 (H) 0 - 149 mg/dL   HDL 56 >39 mg/dL   VLDL Cholesterol Cal 36 5 - 40 mg/dL   LDL Chol Calc (NIH) 137 (H) 0 - 99 mg/dL   Chol/HDL Ratio 4.1 0.0 - 5.0 ratio   Narrative & Impression  CLINICAL DATA:  65 year old male with chronic interstitial lung disease. Cough for 3 days.  EXAM: CHEST - 2 VIEW  COMPARISON:  Chest radiograph 02/09/2020 and earlier.  FINDINGS: PA and lateral views. Lung volumes and mediastinal contours have  not significantly changed since last year, with chronic widespread coarse interstitial opacity and mild perihilar architectural distortion. No cardiomegaly. Visualized tracheal air column is within normal limits. No superimposed pneumothorax or pleural effusion. No confluent or acute pulmonary opacity identified.  No acute osseous abnormality identified. Negative visible bowel gas pattern.  IMPRESSION: Chronic lung disease appears stable since last year. No superimposed acute finding identified.   Electronically Signed   By: Genevie Ann M.D.   On: 01/18/2021 09:24       Assessment & Plan:   Problem List Items Addressed This Visit    ILD (interstitial lung disease) ([place])    Other Visit Diagnoses    COPD with acute exacerbation ([place])    -  Primary   Relevant Medications   levofloxacin (LEVAQUIN) 500 MG tablet   predniSONE (DELTASONE) 20 MG tablet      Acute COPD Exacerbation W/ sputum production Setting of chronic ILD secondary to RA Followed by Pulm No hypoxia or other complication 5 day symptoms, covid negative repeat testing. Considered influenza therapy however no major flu like symptoms causing problem now, mostly productive sputum. Start taking Levaquin antibiotic 500mg  daily x 7 days Start Prednisone taper 7 day CXR STAT today, see results above, no acute process, stable chronic ILD Follow up criteria given, return to [facility] as indicated.  Meds ordered this encounter  Medications  . levofloxacin (LEVAQUIN) 500 MG tablet    Sig: Take 1 tablet (500 mg total) by mouth daily. For 7 days    Dispense:  7 tablet    Refill:  0  . predniSONE (DELTASONE) 20 MG tablet    Sig: Take daily with food. Start with 60mg  (3 pills) x 2 days, then reduce to 40mg  (2 pills) x 2 days, then 20mg  (1 pill) x 3 days    Dispense:  13 tablet    Refill:  0     Follow up plan: Return if symptoms worsen or fail to improve.  Nobie Putnam, DO [facility] 01/18/2021, 8:16 AM

## 2021-02-26 DIAGNOSIS — E034 Atrophy of thyroid (acquired): Secondary | ICD-10-CM | POA: Diagnosis not present

## 2021-02-27 ENCOUNTER — Ambulatory Visit (INDEPENDENT_AMBULATORY_CARE_PROVIDER_SITE_OTHER): Payer: Medicare HMO

## 2021-02-27 VITALS — Ht 71.0 in | Wt 133.6 lb

## 2021-02-27 DIAGNOSIS — Z0000 Encounter for general adult medical examination without abnormal findings: Secondary | ICD-10-CM | POA: Diagnosis not present

## 2021-02-27 DIAGNOSIS — C109 Malignant neoplasm of oropharynx, unspecified: Secondary | ICD-10-CM | POA: Diagnosis not present

## 2021-02-27 DIAGNOSIS — C099 Malignant neoplasm of tonsil, unspecified: Secondary | ICD-10-CM | POA: Diagnosis not present

## 2021-02-27 NOTE — Progress Notes (Signed)
I connected with Lawrence Wells today by telephone and verified that I am speaking with the correct person using two identifiers. Location patient: home Location provider: work Persons participating in the virtual visit: Javar, [place] LPN.   I discussed the limitations, risks, security and privacy concerns of performing an evaluation and management service by telephone and the availability of in person appointments. I also discussed with the patient that there may be a patient responsible charge related to this service. The patient expressed understanding and verbally consented to this telephonic visit.    Interactive audio and video telecommunications were attempted between this provider and patient, however failed, due to patient having technical difficulties OR patient did not have access to video capability.  We continued and completed visit with audio only.     Vital signs may be patient reported or missing.  Subjective:   Lawrence Wells is a 65 y.o. male who presents for an Initial Medicare Annual Wellness Visit.  Review of Systems     Cardiac Risk Factors include: advanced age (>62men, >73 women);male gender;sedentary lifestyle     Objective:    Today's Vitals   02/27/21 0937  Weight: 133 lb 9.6 oz (60.6 kg)  Height: 5\' 11"  (1.803 m)   Body mass index is 18.63 kg/m.  Advanced Directives 02/27/2021 05/03/2020 02/07/2020 01/19/2020 10/27/2019 10/09/2019  Does Patient Have a Medical Advance Directive? No Yes No No Yes Yes  Type of Advance Directive - - - - Living will Living will  Does patient want to make changes to medical advance directive? - - - - No - Patient declined -  Would patient like information on creating a medical advance directive? - - - No - Patient declined No - Patient declined No - Patient declined    Current Medications (verified) Outpatient Encounter Medications as of 02/27/2021  Medication Sig   azaTHIOprine (IMURAN) 50 MG tablet Take 2 tablets  (100 mg total) by mouth daily.   ezetimibe (ZETIA) 10 MG tablet Take 1 tablet (10 mg total) by mouth daily.   levofloxacin (LEVAQUIN) 500 MG tablet Take 1 tablet (500 mg total) by mouth daily. For 7 days (Patient not taking: Reported on 02/27/2021)   predniSONE (DELTASONE) 20 MG tablet Take daily with food. Start with 60mg  (3 pills) x 2 days, then reduce to 40mg  (2 pills) x 2 days, then 20mg  (1 pill) x 3 days (Patient not taking: Reported on 02/27/2021)   No facility-administered encounter medications on file as of 02/27/2021.    Allergies (verified) Atorvastatin   History: Past Medical History:  Diagnosis Date   Arthritis    Collagen vascular disease ([place])    Rhematoid Arthritis   COPD (chronic obstructive pulmonary disease) ([place])    Coronary artery disease    Dyspnea    Emphysema lung ([facility])    Hyperlipidemia    Pulmonary filariasis    Past Surgical History:  Procedure Laterality Date   SINUS SURGERY WITH INSTATRAK     Family History  Problem Relation Age of Onset   Heart disease Mother    Heart attack Mother 89   Arthritis Mother    Heart disease Father    Heart attack Father 36   Healthy Sister    Hyperlipidemia Brother    Throat cancer Brother 85   Heart disease Brother    Healthy Son    Healthy Daughter    Social History   Socioeconomic History   Marital status: Married    Spouse name:  [place]   Number of children: Not on file   Years of education: High School   Highest education level: Not on file  Occupational History   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[employer])    Comment: Retired 2012  Tobacco Use   Smoking status: Former    Packs/day: 1.50    Years: 30.00    Pack years: 45.00    Types: Cigarettes    Quit date: 2007    Years since quitting: 15.5   Smokeless tobacco: Former    Types: Chew    Quit date: 2007   Tobacco comments:    Dip smokeless tobacco >20-30 years  Vaping Use   Vaping Use: Never used  Substance and Sexual Activity   Alcohol  use: Not Currently    Comment: occ   Drug use: No   Sexual activity: Not Currently  Other Topics Concern   Not on file  Social History Narrative   Right handed   One story home   Drinks caffeine   Social Determinants of Health   Financial Resource Strain: Low Risk    Difficulty of Paying Living Expenses: Not hard at all  Food Insecurity: No Food Insecurity   Worried About Charity fundraiser in the Last Year: Never true   Arboriculturist in the Last Year: Never true  Transportation Needs: No Transportation Needs   Lack of Transportation (Medical): No   Lack of Transportation (Non-Medical): No  Physical Activity: Inactive   Days of Exercise per Week: 0 days   Minutes of Exercise per Session: 0 min  Stress: No Stress Concern Present   Feeling of Stress : Not at all  Social Connections: Not on file    Tobacco Counseling Counseling given: Not Answered Tobacco comments: Dip smokeless tobacco >20-30 years   Clinical Intake:  Pre-visit preparation completed: Yes  Pain : No/denies pain     Nutritional Status: BMI of 19-24  Normal Nutritional Risks: None Diabetes: No  How often do you need to have someone help you when you read instructions, pamphlets, or other written materials from your doctor or pharmacy?: 1 - Never What is the last grade level you completed in school?: 12th grade  Diabetic? no  Interpreter Needed?: No  Information entered by :: NAllen LPN   Activities of Daily Living In your present state of health, do you have any difficulty performing the following activities: 02/27/2021  Hearing? Y  Comment needs ears cleaned out  Vision? N  Difficulty concentrating or making decisions? N  Walking or climbing stairs? N  Dressing or bathing? N  Doing errands, shopping? N  Preparing Food and eating ? N  Using the Toilet? N  In the past six months, have you accidently leaked urine? N  Do you have problems with loss of bowel control? N  Managing your  Medications? N  Managing your Finances? N  Housekeeping or managing your Housekeeping? N  Some recent data might be hidden    Patient Care Team: Olin Hauser, DO as PCP - General (Family Medicine)  Indicate any recent Medical Services you may have received from other than [facility] providers in the past year (date may be approximate).     Assessment:   This is a routine wellness examination for Crescencio.  Hearing/Vision screen Vision Screening - Comments:: Regular eye exams, Dr. Ellin Mayhew  Dietary issues and exercise activities discussed: Current Exercise Habits: The patient does not participate in regular exercise at present  Goals Addressed             This Visit's Progress    Patient Stated       02/27/2021, wants to gain weight        Depression Screen PHQ 2/9 Scores 02/27/2021 12/08/2019 12/01/2019 10/28/2019 09/08/2019 07/19/2019 05/07/2017  PHQ - 2 Score 0 1 0 1 0 0 0  PHQ- 9 Score - 4 4 8  - - -    Fall Risk Fall Risk  02/27/2021 05/03/2020 10/27/2019 09/08/2019 07/19/2019  Falls in the past year? 0 0 1 0 0  Comment - - fell in bathroom from feet hurting - -  Number falls in past yr: - 0 0 0 0  Injury with Fall? - 0 0 0 -  Risk for fall due to : Medication side effect - History of fall(s);Impaired balance/gait;Impaired mobility - -  Follow up Falls evaluation completed;Education provided;Falls prevention discussed - Falls prevention discussed Falls evaluation completed -    FALL RISK PREVENTION PERTAINING TO THE HOME:  Any stairs in or around the home? No  If so, are there any without handrails?  N/a Home free of loose throw rugs in walkways, pet beds, electrical cords, etc? Yes  Adequate lighting in your home to reduce risk of falls? Yes   ASSISTIVE DEVICES UTILIZED TO PREVENT FALLS:  Life alert? No  Use of a cane, walker or w/c? No  Grab bars in the bathroom? Yes  Shower chair or bench in shower? No  Elevated toilet seat or a handicapped toilet? No    TIMED UP AND GO:  Was the test performed? No .       Cognitive Function:     6CIT Screen 02/27/2021  What Year? 0 points  What month? 0 points  What time? 0 points  Count back from 20 0 points  Months in reverse 0 points  Repeat phrase 0 points  Total Score 0    Immunizations Immunization History  Administered Date(s) Administered   Influenza, High Dose Seasonal PF 07/28/2020   PFIZER(Purple Top)SARS-COV-2 Vaccination 11/22/2019, 12/15/2019   Pneumococcal Conjugate-13 09/08/2019   Tdap 04/19/2015, 05/07/2017   Zoster Recombinat (Shingrix) 09/29/2019, 11/27/2019    TDAP status: Up to date  Flu Vaccine status: Up to date  Pneumococcal vaccine status: Due, Education has been provided regarding the importance of this vaccine. Advised may receive this vaccine at local pharmacy or Health Dept. Aware to provide a copy of the vaccination record if obtained from local pharmacy or Health Dept. Verbalized acceptance and understanding.  Covid-19 vaccine status: Completed vaccines  Qualifies for Shingles Vaccine? Yes   Zostavax completed No   Shingrix Completed?: Yes  Screening Tests Health Maintenance  Topic Date Due   COLONOSCOPY (Pts 45-3yrs Insurance coverage will need to be confirmed)  Never done   COVID-19 Vaccine (3 - Pfizer risk series) 01/12/2020   PNA vac Low Risk Adult (2 of 2 - PPSV23) 09/07/2020   INFLUENZA VACCINE  03/19/2021   TETANUS/TDAP  05/08/2027   Hepatitis C Screening  Completed   Zoster Vaccines- Shingrix  Completed   HPV VACCINES  Aged Out    Health Maintenance  Health Maintenance Due  Topic Date Due   COLONOSCOPY (Pts 45-102yrs Insurance coverage will need to be confirmed)  Never done   COVID-19 Vaccine (3 - Pfizer risk series) 01/12/2020   PNA vac Low Risk Adult (2 of 2 - PPSV23) 09/07/2020    Colorectal cancer screening: due  Lung Cancer Screening: (Low  Dose CT Chest recommended if Age 45-80 years, 30 pack-year currently smoking OR  have quit w/in 15years.) does not qualify.   Lung Cancer Screening Referral: no  Additional Screening:  Hepatitis C Screening: does qualify; Completed 05/07/2017  Vision Screening: Recommended annual ophthalmology exams for early detection of glaucoma and other disorders of the eye. Is the patient up to date with their annual eye exam?  Yes  Who is the provider or what is the name of the office in which the patient attends annual eye exams? Dr. Ellin Mayhew If pt is not established with a provider, would they like to be referred to a provider to establish care? No .   Dental Screening: Recommended annual dental exams for proper oral hygiene  Community Resource Referral / Chronic Care Management: CRR required this visit?  No   CCM required this visit?  No      Plan:     I have personally reviewed and noted the following in the patient's chart:   Medical and social history Use of alcohol, tobacco or illicit drugs  Current medications and supplements including opioid prescriptions. Patient is not currently taking opioid prescriptions. Functional ability and status Nutritional status Physical activity Advanced directives List of other physicians Hospitalizations, surgeries, and ER visits in previous 12 months Vitals Screenings to include cognitive, depression, and falls Referrals and appointments  In addition, I have reviewed and discussed with patient certain preventive protocols, quality metrics, and best practice recommendations. A written personalized care plan for preventive services as well as general preventive health recommendations were provided to patient.     Kellie Simmering, LPN   02/17/7792   Nurse Notes:

## 2021-02-27 NOTE — Patient Instructions (Signed)
Lawrence Wells , Thank you for taking time to come for your Medicare Wellness Visit. I appreciate your ongoing commitment to your health goals. Please review the following plan we discussed and let me know if I can assist you in the future.   Screening recommendations/referrals: Colonoscopy: due Recommended yearly ophthalmology/optometry visit for glaucoma screening and checkup Recommended yearly dental visit for hygiene and checkup  Vaccinations: Influenza vaccine: completed 07/28/2020, due 03/19/2021 Pneumococcal vaccine: due Tdap vaccine: completed 05/07/2017, due 05/08/2027 Shingles vaccine: completed   Covid-19:  12/15/2019, 11/22/2019  Advanced directives: Advance directive discussed with you today.   Conditions/risks identified: none  Next appointment: Follow up in one year for your annual wellness visit.   Preventive Care 22 Years and Older, Male Preventive care refers to lifestyle choices and visits with your health care provider that can promote health and wellness. What does preventive care include? A yearly physical exam. This is also called an annual well check. Dental exams once or twice a year. Routine eye exams. Ask your health care provider how often you should have your eyes checked. Personal lifestyle choices, including: Daily care of your teeth and gums. Regular physical activity. Eating a healthy diet. Avoiding tobacco and drug use. Limiting alcohol use. Practicing safe sex. Taking low doses of aspirin every day. Taking vitamin and mineral supplements as recommended by your health care provider. What happens during an annual well check? The services and screenings done by your health care provider during your annual well check will depend on your age, overall health, lifestyle risk factors, and family history of disease. Counseling  Your health care provider may ask you questions about your: Alcohol use. Tobacco use. Drug use. Emotional well-being. Home and  relationship well-being. Sexual activity. Eating habits. History of falls. Memory and ability to understand (cognition). Work and work Statistician. Screening  You may have the following tests or measurements: Height, weight, and BMI. Blood pressure. Lipid and cholesterol levels. These may be checked every 5 years, or more frequently if you are over 33 years old. Skin check. Lung cancer screening. You may have this screening every year starting at age 72 if you have a 30-pack-year history of smoking and currently smoke or have quit within the past 15 years. Fecal occult blood test (FOBT) of the stool. You may have this test every year starting at age 33. Flexible sigmoidoscopy or colonoscopy. You may have a sigmoidoscopy every 5 years or a colonoscopy every 10 years starting at age 60. Prostate cancer screening. Recommendations will vary depending on your family history and other risks. Hepatitis C blood test. Hepatitis B blood test. Sexually transmitted disease (STD) testing. Diabetes screening. This is done by checking your blood sugar (glucose) after you have not eaten for a while (fasting). You may have this done every 1-3 years. Abdominal aortic aneurysm (AAA) screening. You may need this if you are a current or former smoker. Osteoporosis. You may be screened starting at age 46 if you are at high risk. Talk with your health care provider about your test results, treatment options, and if necessary, the need for more tests. Vaccines  Your health care provider may recommend certain vaccines, such as: Influenza vaccine. This is recommended every year. Tetanus, diphtheria, and acellular pertussis (Tdap, Td) vaccine. You may need a Td booster every 10 years. Zoster vaccine. You may need this after age 55. Pneumococcal 13-valent conjugate (PCV13) vaccine. One dose is recommended after age 14. Pneumococcal polysaccharide (PPSV23) vaccine. One dose is recommended after  age 2. Talk to your  health care provider about which screenings and vaccines you need and how often you need them. This information is not intended to replace advice given to you by your health care provider. Make sure you discuss any questions you have with your health care provider. Document Released: 09/01/2015 Document Revised: 04/24/2016 Document Reviewed: 06/06/2015 Elsevier Interactive Patient Education  2017 [place] Prevention in the Home Falls can cause injuries. They can happen to people of all ages. There are many things you can do to make your home safe and to help prevent falls. What can I do on the outside of my home? Regularly fix the edges of walkways and driveways and fix any cracks. Remove anything that might make you trip as you walk through a door, such as a raised step or threshold. Trim any bushes or trees on the path to your home. Use bright outdoor lighting. Clear any walking paths of anything that might make someone trip, such as rocks or tools. Regularly check to see if handrails are loose or broken. Make sure that both sides of any steps have handrails. Any raised decks and porches should have guardrails on the edges. Have any leaves, snow, or ice cleared regularly. Use sand or salt on walking paths during winter. Clean up any spills in your garage right away. This includes oil or grease spills. What can I do in the bathroom? Use night lights. Install grab bars by the toilet and in the tub and shower. Do not use towel bars as grab bars. Use non-skid mats or decals in the tub or shower. If you need to sit down in the shower, use a plastic, non-slip stool. Keep the floor dry. Clean up any water that spills on the floor as soon as it happens. Remove soap buildup in the tub or shower regularly. Attach bath mats securely with double-sided non-slip rug tape. Do not have throw rugs and other things on the floor that can make you trip. What can I do in the bedroom? Use night  lights. Make sure that you have a light by your bed that is easy to reach. Do not use any sheets or blankets that are too big for your bed. They should not hang down onto the floor. Have a firm chair that has side arms. You can use this for support while you get dressed. Do not have throw rugs and other things on the floor that can make you trip. What can I do in the kitchen? Clean up any spills right away. Avoid walking on wet floors. Keep items that you use a lot in easy-to-reach places. If you need to reach something above you, use a strong step stool that has a grab bar. Keep electrical cords out of the way. Do not use floor polish or wax that makes floors slippery. If you must use wax, use non-skid floor wax. Do not have throw rugs and other things on the floor that can make you trip. What can I do with my stairs? Do not leave any items on the stairs. Make sure that there are handrails on both sides of the stairs and use them. Fix handrails that are broken or loose. Make sure that handrails are as long as the stairways. Check any carpeting to make sure that it is firmly attached to the stairs. Fix any carpet that is loose or worn. Avoid having throw rugs at the top or bottom of the stairs. If you do  have throw rugs, attach them to the floor with carpet tape. Make sure that you have a light switch at the top of the stairs and the bottom of the stairs. If you do not have them, ask someone to add them for you. What else can I do to help prevent falls? Wear shoes that: Do not have high heels. Have rubber bottoms. Are comfortable and fit you well. Are closed at the toe. Do not wear sandals. If you use a stepladder: Make sure that it is fully opened. Do not climb a closed stepladder. Make sure that both sides of the stepladder are locked into place. Ask someone to hold it for you, if possible. Clearly mark and make sure that you can see: Any grab bars or handrails. First and last  steps. Where the edge of each step is. Use tools that help you move around (mobility aids) if they are needed. These include: Canes. Walkers. Scooters. Crutches. Turn on the lights when you go into a dark area. Replace any light bulbs as soon as they burn out. Set up your furniture so you have a clear path. Avoid moving your furniture around. If any of your floors are uneven, fix them. If there are any pets around you, be aware of where they are. Review your medicines with your doctor. Some medicines can make you feel dizzy. This can increase your chance of falling. Ask your doctor what other things that you can do to help prevent falls. This information is not intended to replace advice given to you by your health care provider. Make sure you discuss any questions you have with your health care provider. Document Released: 06/01/2009 Document Revised: 01/11/2016 Document Reviewed: 09/09/2014 Elsevier Interactive Patient Education  2017 [employer].

## 2021-03-27 ENCOUNTER — Telehealth: Payer: Self-pay | Admitting: Internal Medicine

## 2021-03-27 NOTE — Telephone Encounter (Signed)
Patient is returning phone call. Patient phone number is [phone].

## 2021-03-27 NOTE — Telephone Encounter (Signed)
Called and spoke with patient who is calling because he needed his hydroxychloroquine refilled. He states that after he called it was actually dropped off in the mail along with Ezetimibe. Advised him that those are not medications from [place] but I'm glad he got them in the mail and to let us know if he needs anything else. He expressed understanding. Looks like hydroxychloroquine is from Rheumatology and Ezetimibe is from Dr. Rockey Situ. Nothing further needed at this time.

## 2021-04-02 ENCOUNTER — Ambulatory Visit: Payer: Medicare HMO | Admitting: Cardiovascular Disease

## 2021-04-10 DIAGNOSIS — Z79899 Other long term (current) drug therapy: Secondary | ICD-10-CM | POA: Diagnosis not present

## 2021-04-10 DIAGNOSIS — J849 Interstitial pulmonary disease, unspecified: Secondary | ICD-10-CM | POA: Diagnosis not present

## 2021-04-10 DIAGNOSIS — M0579 Rheumatoid arthritis with rheumatoid factor of multiple sites without organ or systems involvement: Secondary | ICD-10-CM | POA: Diagnosis not present

## 2021-04-16 ENCOUNTER — Ambulatory Visit: Admission: RE | Admit: 2021-04-16 | Payer: Medicare HMO | Source: Ambulatory Visit

## 2021-05-10 IMAGING — CT CT ANGIO CHEST
2 of 6 series · 18 of 46 positions shown · IV contrast (omnipaque)
Comparison: Chest radiograph dated 01/19/2020 and CT dated
09/14/2019.

CLINICAL DATA: 63-year-old male with hypoxia and [id number]. Rule out
pulmonary embolism.

EXAM:
CT ANGIOGRAPHY CHEST WITH CONTRAST
TECHNIQUE: Multidetector CT imaging of the chest was performed using the
standard protocol during bolus administration of intravenous
contrast. Multiplanar CT image reconstructions and MIPs were
obtained to evaluate the vascular anatomy.
CONTRAST:  75mL OMNIPAQUE IOHEXOL 350 MG/ML SOLN

[Series 8: thins · axial · 0.72mm/px · z∈[+967,+1214]mm · 15 of 271 slices shown]
[im 12/271  lung]
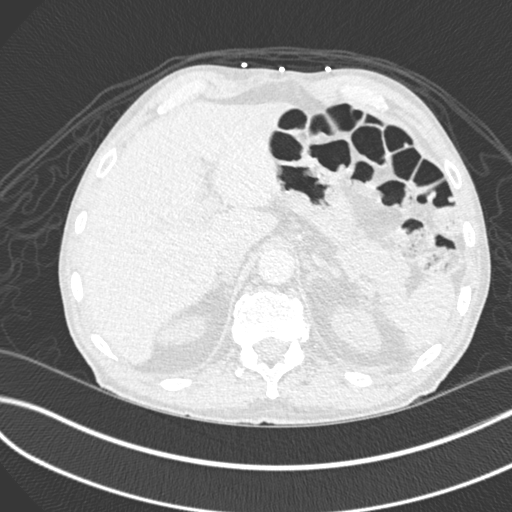
[im 36/271  soft-tissue]
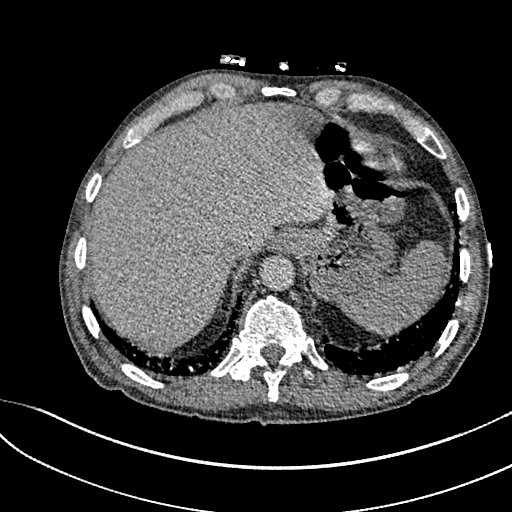
[im 47/271  lung]
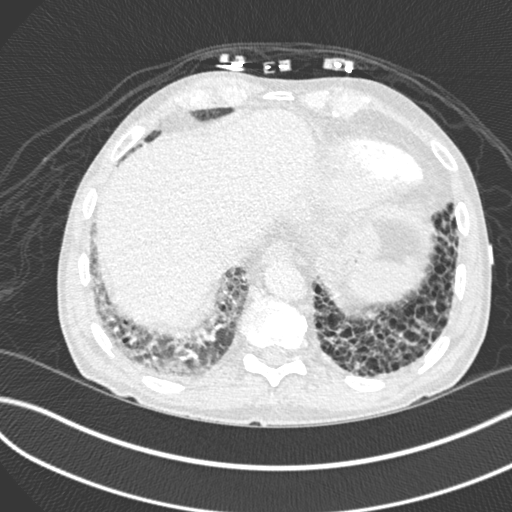
[im 71/271  soft-tissue]
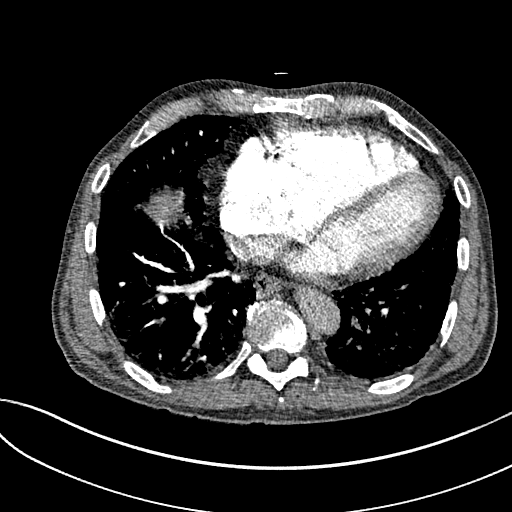
[im 83/271  lung]
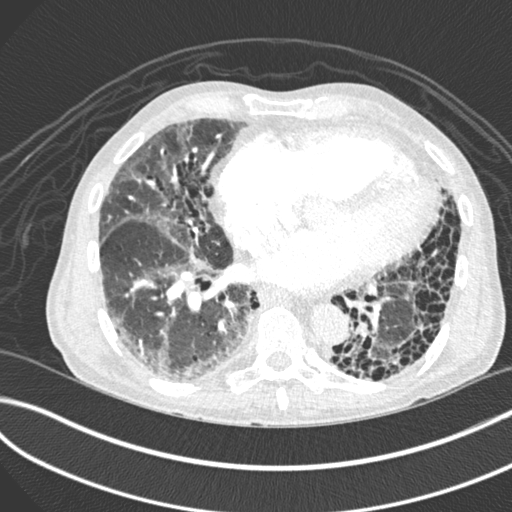
[im 106/271  soft-tissue]
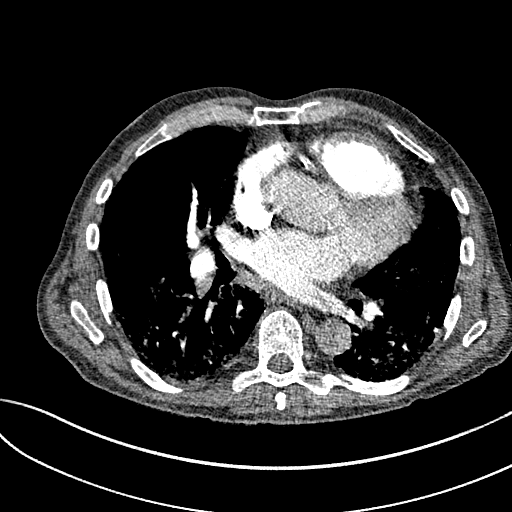
[im 118/271  lung]
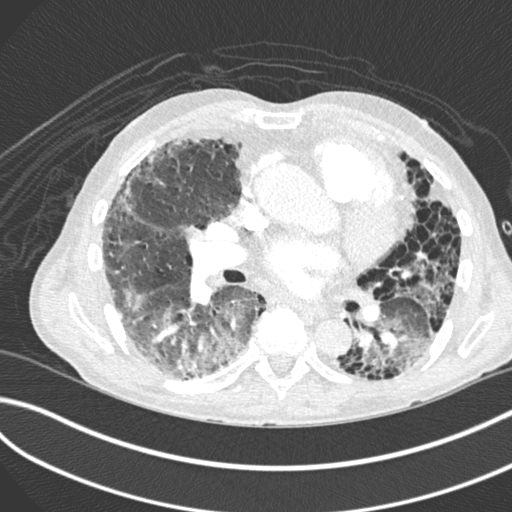
[im 141/271  soft-tissue]
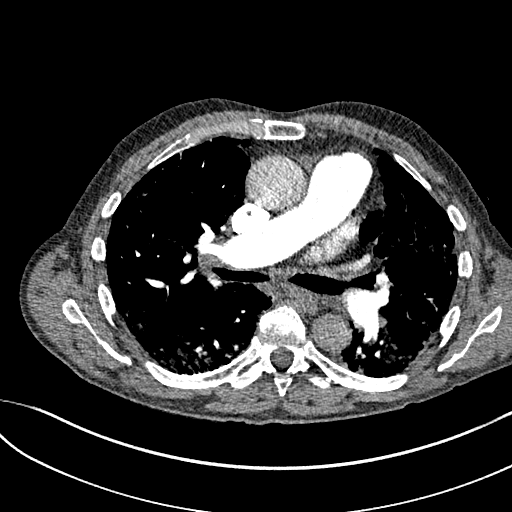
[im 153/271  lung]
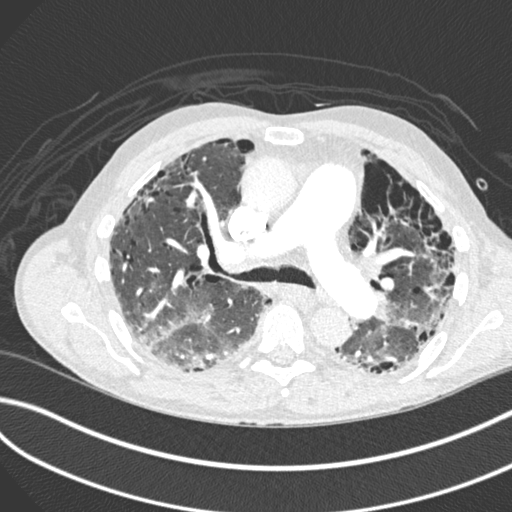
[im 165/271  soft-tissue]
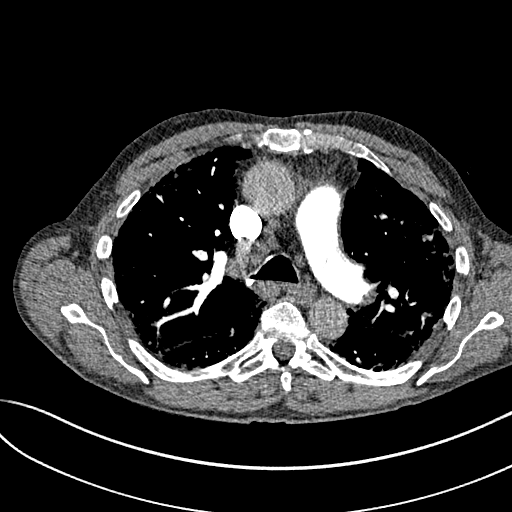
[im 188/271  lung]
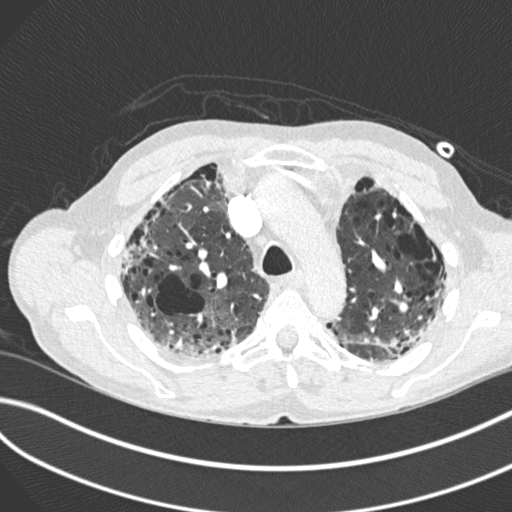
[im 200/271  soft-tissue]
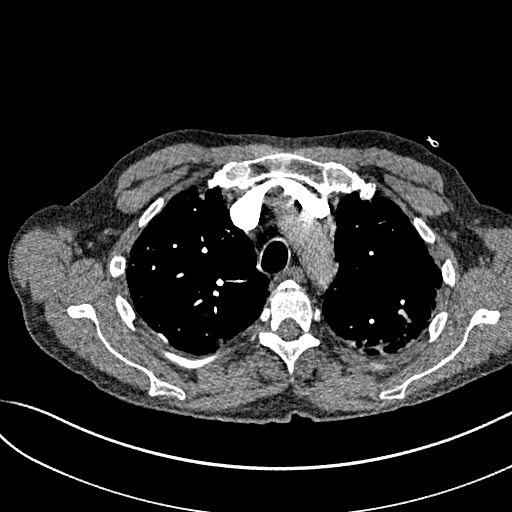
[im 224/271  lung]
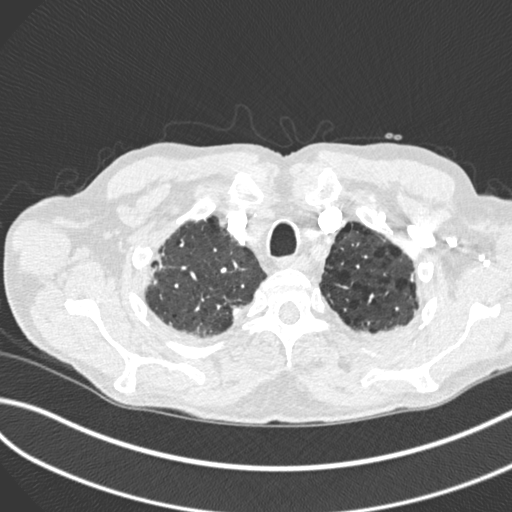
[im 235/271  soft-tissue]
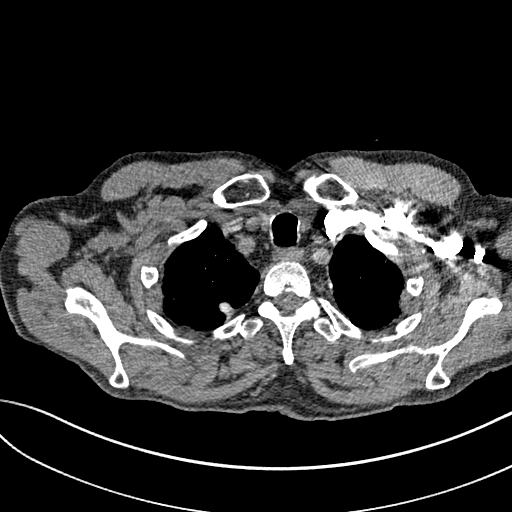
[im 259/271  lung]
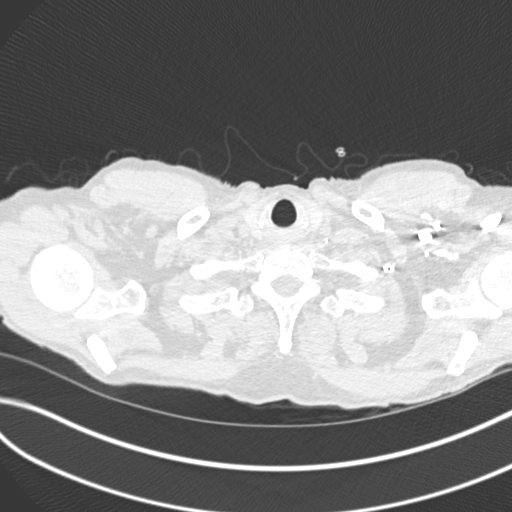

[Series 10: coronal mpr · coronal · 0.59mm/px · 3 of 138 slices shown]
[im 35/138  soft-tissue]
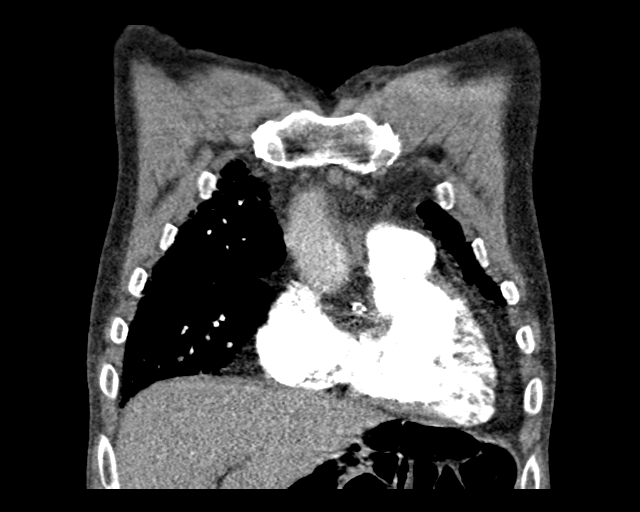
[im 69/138  soft-tissue]
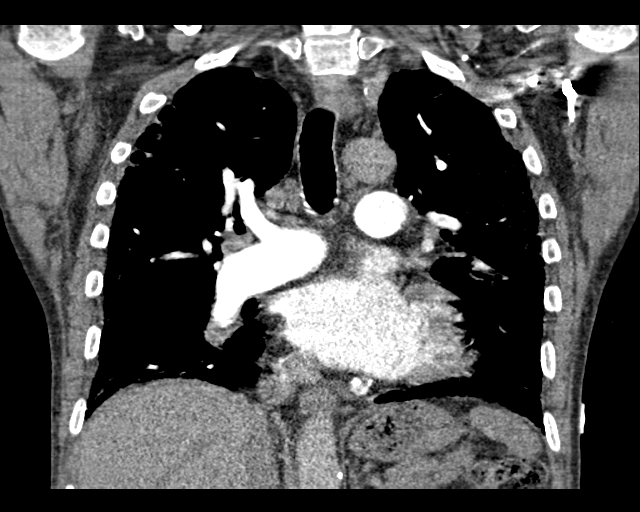
[im 103/138  soft-tissue]
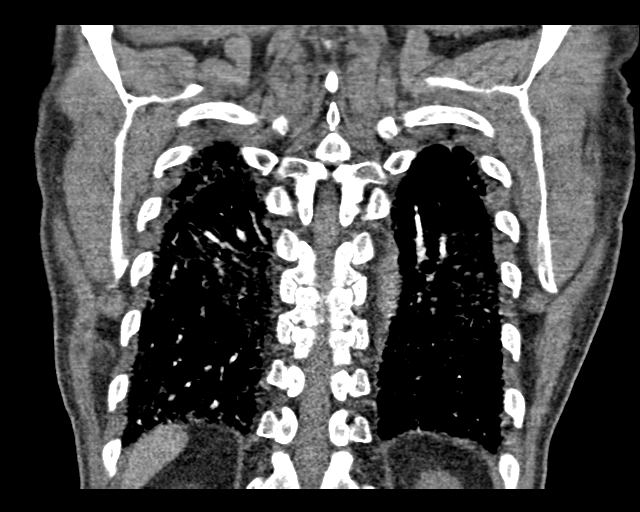

[18 of 46 positions shown; findings below may reference images not displayed]

FINDINGS: Cardiovascular: There is mild cardiomegaly. No pericardial effusion.
Coronary vascular calcifications primarily involving the LAD and
RCA. The thoracic aorta is unremarkable. Mild dilatation of the main
pulmonary trunk suggestive of a degree of pulmonary hypertension.
Evaluation of the pulmonary arteries is somewhat limited due to
respiratory motion artifact. No pulmonary artery embolus identified.

Mediastinum/Nodes: Several top-normal bilateral hilar and
mediastinal lymph nodes. These measure up to approximately 11 mm in
short axis in the right hilum 13 mm in the subcarinal region and 10
mm in the aortopulmonic window relatively similar to prior CT. The
esophagus is grossly unremarkable. No mediastinal fluid collection.

Lungs/Pleura: There is background of emphysema and interstitial lung
disease. There is subpleural reticulation and honeycombing most
severely involving the left lung base. There is traction
bronchiectasis. Diffuse ground-glass density throughout the lungs
with confluent densities involving the lower lung field may
represent superimposed pneumonia. Right apical subpleural scarring.
No lobar consolidation, pleural effusion, or pneumothorax. The
central airways are patent.

Upper Abdomen: No acute abnormality.

Musculoskeletal: No chest wall abnormality. No acute or significant
osseous findings.

Review of the MIP images confirms the above findings.
IMPRESSION: 1. No CT evidence of pulmonary artery embolus.
2. Emphysema and interstitial lung disease.
3. Diffuse airspace ground-glass opacity and confluent areas of
densities primarily involving the lower lung field concerning for
superimposed pneumonia. Clinical correlation and follow-up
recommended. Please refer to the accompanying high-resolution CT
report for detail pleuroparenchymal findings.
4. Aortic Atherosclerosis (XKYKF-QSF.F) and Emphysema (XKYKF-0UX.0).

## 2021-05-30 DIAGNOSIS — Z08 Encounter for follow-up examination after completed treatment for malignant neoplasm: Secondary | ICD-10-CM | POA: Diagnosis not present

## 2021-05-30 DIAGNOSIS — Z5189 Encounter for other specified aftercare: Secondary | ICD-10-CM | POA: Diagnosis not present

## 2021-05-30 DIAGNOSIS — K117 Disturbances of salivary secretion: Secondary | ICD-10-CM | POA: Diagnosis not present

## 2021-05-30 DIAGNOSIS — C099 Malignant neoplasm of tonsil, unspecified: Secondary | ICD-10-CM | POA: Diagnosis not present

## 2021-05-30 DIAGNOSIS — C109 Malignant neoplasm of oropharynx, unspecified: Secondary | ICD-10-CM | POA: Diagnosis not present

## 2021-05-30 DIAGNOSIS — Z87891 Personal history of nicotine dependence: Secondary | ICD-10-CM | POA: Diagnosis not present

## 2021-05-30 DIAGNOSIS — Z923 Personal history of irradiation: Secondary | ICD-10-CM | POA: Diagnosis not present

## 2021-06-05 DIAGNOSIS — Z923 Personal history of irradiation: Secondary | ICD-10-CM | POA: Insufficient documentation

## 2021-06-05 DIAGNOSIS — K117 Disturbances of salivary secretion: Secondary | ICD-10-CM | POA: Insufficient documentation

## 2021-06-05 DIAGNOSIS — Z08 Encounter for follow-up examination after completed treatment for malignant neoplasm: Secondary | ICD-10-CM | POA: Insufficient documentation

## 2021-07-29 ENCOUNTER — Telehealth: Payer: Medicare HMO | Admitting: Emergency Medicine

## 2021-07-29 NOTE — Progress Notes (Signed)
Called patient after scheduled appointment time.  Patient reports SOB, chest tightness, fatigue.  Has hx of pulmonary fibrosis.  Concerned this is a "flare up."  Based on symptoms I encouraged patient to be seen and evaluated in person.  We are unable to rule out heart attack, pneumonia, blood clot of video.  Patient declines at this time and would like to follow up with lung specialist in the morning.  Told patient to call 911 if he changed his mind.

## 2021-07-29 NOTE — Progress Notes (Signed)
The patient no-showed for appointment after waiting for at least 10 minutes from appointment time for patient to join. They will be marked as a NS for this appointment/time.   Lestine Box, PA-C

## 2021-07-30 ENCOUNTER — Ambulatory Visit (INDEPENDENT_AMBULATORY_CARE_PROVIDER_SITE_OTHER): Payer: Medicare HMO | Admitting: Family Medicine

## 2021-07-30 ENCOUNTER — Ambulatory Visit: Payer: Self-pay | Admitting: *Deleted

## 2021-07-30 ENCOUNTER — Other Ambulatory Visit: Payer: Self-pay

## 2021-07-30 ENCOUNTER — Encounter: Payer: Self-pay | Admitting: Family Medicine

## 2021-07-30 ENCOUNTER — Ambulatory Visit
Admission: RE | Admit: 2021-07-30 | Discharge: 2021-07-30 | Disposition: A | Discharge status: Home / Self Care | Payer: Medicare HMO | Attending: Family Medicine | Admitting: Family Medicine

## 2021-07-30 ENCOUNTER — Ambulatory Visit
Admission: RE | Admit: 2021-07-30 | Discharge: 2021-07-30 | Disposition: A | Discharge status: Home / Self Care | Payer: Medicare HMO | Source: Ambulatory Visit | Attending: Family Medicine | Admitting: Family Medicine

## 2021-07-30 VITALS — BP 86/51 | HR 104 | Ht 71.0 in | Wt 126.2 lb

## 2021-07-30 DIAGNOSIS — J441 Chronic obstructive pulmonary disease with (acute) exacerbation: Secondary | ICD-10-CM

## 2021-07-30 DIAGNOSIS — R059 Cough, unspecified: Secondary | ICD-10-CM | POA: Diagnosis not present

## 2021-07-30 DIAGNOSIS — R058 Other specified cough: Secondary | ICD-10-CM

## 2021-07-30 DIAGNOSIS — J449 Chronic obstructive pulmonary disease, unspecified: Secondary | ICD-10-CM | POA: Diagnosis not present

## 2021-07-30 DIAGNOSIS — R0602 Shortness of breath: Secondary | ICD-10-CM | POA: Insufficient documentation

## 2021-07-30 MED ORDER — PREDNISONE 20 MG PO TABS: ORAL_TABLET | ORAL | 0 refills | Status: DC

## 2021-07-30 MED ORDER — LEVOFLOXACIN 500 MG PO TABS
500.0000 mg | ORAL_TABLET | Freq: Every day | ORAL | 0 refills | Status: DC
Start: 2021-07-30 — End: 2021-08-27

## 2021-07-30 NOTE — Telephone Encounter (Signed)
Reason for Disposition  [1] Longstanding difficulty breathing (e.g., CHF, COPD, emphysema) AND [2] WORSE than normal    Coughing up green mucus.   I get dizzy when get up from sitting too.  Answer Assessment - Initial Assessment Questions 1. RESPIRATORY STATUS: "Describe your breathing?" (e.g., wheezing, shortness of breath, unable to speak, severe coughing)      Pt calling in.   I have pulmonary fibrosis dx in 2021.  I was in hospital.   I woke up Friday morning I noticed I was short of breath and coughing up mucus.   My rib cage and lower back hurt from soreness from coughing.   My wife thinks I'm having a relapse.  I feel like crap. 2. ONSET: "When did this breathing problem begin?"      Friday morning.    3. PATTERN "Does the difficult breathing come and go, or has it been constant since it started?"      Every time I get up and move around it gets me.   It's the same symptoms as the pulmonary fibrosis 4. SEVERITY: "How bad is your breathing?" (e.g., mild, moderate, severe)    - MILD: No SOB at rest, mild SOB with walking, speaks normally in sentences, can lie down, no retractions, pulse < 100.    - MODERATE: SOB at rest, SOB with minimal exertion and prefers to sit, cannot lie down flat, speaks in phrases, mild retractions, audible wheezing, pulse 100-120.    - SEVERE: Very SOB at rest, speaks in single words, struggling to breathe, sitting hunched forward, retractions, pulse > 120      Moderate 5. RECURRENT SYMPTOM: "Have you had difficulty breathing before?" If Yes, ask: "When was the last time?" and "What happened that time?"      Yes pulmonary fibrosis.   6. CARDIAC HISTORY: "Do you have any history of heart disease?" (e.g., heart attack, angina, bypass surgery, angioplasty)      No 7. LUNG HISTORY: "Do you have any history of lung disease?"  (e.g., pulmonary embolus, asthma, emphysema)     Pulmonary fibrosis 8. CAUSE: "What do you think is causing the breathing problem?"      Maybe  pulmonary fibrosis  9. OTHER SYMPTOMS: "Do you have any other symptoms? (e.g., dizziness, runny nose, cough, chest pain, fever)     No sore throat.   I'm coughing up a lot of mucus in the mornings it's greenish looking.   Last 2-3 days my chest is tight.   Feels like my ribs are going to break.   I've lost a lot of weight too.  I lost 44 lbs with throat cancer and had a feeding tube in Feb. That was removed.   I've had trouble swallowing so I've had problems with regaining my weight since the cancer.   Certain things are hard to swallow like sandwiches, peanut butter.   It needs to be wet stuff.   10. O2 SATURATION MONITOR:  "Do you use an oxygen saturation monitor (pulse oximeter) at home?" If Yes, "What is your reading (oxygen level) today?" "What is your usual oxygen saturation reading?" (e.g., 95%)       Last 2 days 94-96-98% on pulse oximeter.   96% is about my normal.  Not on O2.   That stopped in March using O2.    11. PREGNANCY: "Is there any chance you are pregnant?" "When was your last menstrual period?"       N/A 12. TRAVEL: "Have you traveled  out of the country in the last month?" (e.g., travel history, exposures)       No sick exposures.  Protocols used: Breathing Difficulty-A-AH

## 2021-07-30 NOTE — Patient Instructions (Addendum)
Thank you for coming to the office today.  RSV FLU COVID Swab today - result in 24-48 hours  We will start with Chest X-ray results later today  We may need to treat for COPD flare with antibiotics steroids if needed - or can change based on swab test  Please schedule a Follow-up Appointment to: Return if symptoms worsen or fail to improve.  If you have any other questions or concerns, please feel free to call the office or send a message through [place]. You may also schedule an earlier appointment if necessary.  Additionally, you may be receiving a survey about your experience at our office within a few days to 1 week by e-mail or mail. We value your feedback.  Nobie Putnam, DO [place]

## 2021-07-30 NOTE — Progress Notes (Signed)
Subjective:    Patient ID: Lawrence Wells, male    DOB: June 29, 1956, 65 y.o.   MRN: [id number]  Lawrence Wells is a 65 y.o. male presenting on 07/30/2021 for Cough and Shortness of Breath  Patient presents for a same day appointment.  HPI  Acute respiratory illness Productive cough Worse since last week Thurs/Fri No sick contact He has known Pulm Fibrosis / Emphysema COPD, followed by Pulm. History of COPD flares / pneumonia Concern productive cough dyspnea   Depression screen [facility] 2/9 02/27/2021 12/08/2019 12/01/2019  Decreased Interest 0 0 0  Down, Depressed, Hopeless 0 1 0  PHQ - 2 Score 0 1 0  Altered sleeping - 1 1  Tired, decreased energy - 1 1  Change in appetite - 0 0  Feeling bad or failure about yourself  - 0 1  Trouble concentrating - 0 0  Moving slowly or fidgety/restless - 1 1  Suicidal thoughts - 0 0  PHQ-9 Score - 4 4  Difficult doing work/chores - Not difficult at all -    Social History   Tobacco Use   Smoking status: Former    Packs/day: 1.50    Years: 30.00    Pack years: 45.00    Types: Cigarettes    Quit date: 2007    Years since quitting: 15.9   Smokeless tobacco: Former    Types: Chew    Quit date: 2007   Tobacco comments:    Dip smokeless tobacco >20-30 years  Vaping Use   Vaping Use: Never used  Substance Use Topics   Alcohol use: Not Currently    Comment: occ   Drug use: No    Review of Systems Per HPI unless specifically indicated above     Objective:    BP (!) 86/51   Pulse (!) 104   Ht 5\' 11"  (1.803 m)   Wt 126 lb 3.2 oz (57.2 kg)   SpO2 90%   BMI 17.60 kg/m   Wt Readings from Last 3 Encounters:  07/30/21 126 lb 3.2 oz (57.2 kg)  02/27/21 133 lb 9.6 oz (60.6 kg)  01/18/21 131 lb 6.4 oz (59.6 kg)    Physical Exam Vitals and nursing note reviewed.  Constitutional:      General: He is not in acute distress.    Appearance: He is well-developed. He is not diaphoretic.     Comments: Well-appearing, comfortable,  cooperative  HENT:     Head: Normocephalic and atraumatic.  Eyes:     General:        Right eye: No discharge.        Left eye: No discharge.     Conjunctiva/sclera: Conjunctivae normal.  Neck:     Thyroid: No thyromegaly.  Cardiovascular:     Rate and Rhythm: Normal rate and regular rhythm.     Pulses: Normal pulses.     Heart sounds: Normal heart sounds. No murmur heard. Pulmonary:     Effort: Pulmonary effort is normal. No respiratory distress.     Breath sounds: Examination of the right-lower field reveals decreased breath sounds and rhonchi. Examination of the left-lower field reveals decreased breath sounds and rhonchi. Decreased breath sounds and rhonchi present. No wheezing or rales.  Musculoskeletal:        General: Normal range of motion.     Cervical back: Normal range of motion and neck supple.  Lymphadenopathy:     Cervical: No cervical adenopathy.  Skin:    General: Skin is  warm and dry.     Findings: No erythema or rash.  Neurological:     Mental Status: He is alert and oriented to person, place, and time. Mental status is at baseline.  Psychiatric:        Behavior: Behavior normal.     Comments: Well groomed, good eye contact, normal speech and thoughts   Results for orders placed or performed in visit on 09/27/20  Lipid panel  Result Value Ref Range   Cholesterol, Total 229 (H) 100 - 199 mg/dL   Triglycerides 203 (H) 0 - 149 mg/dL   HDL 56 >39 mg/dL   VLDL Cholesterol Cal 36 5 - 40 mg/dL   LDL Chol Calc (NIH) 137 (H) 0 - 99 mg/dL   Chol/HDL Ratio 4.1 0.0 - 5.0 ratio   I have personally reviewed the radiology report from 07/30/21 CXR.   CLINICAL DATA:  Productive cough.  COPD   EXAM: CHEST - 2 VIEW   COMPARISON:  01/18/2021   FINDINGS: Stable cardiomediastinal contours. Advanced chronic changes of interstitial lung disease with basilar predominance, left worse than right. Increasing interstitial opacities within both lung bases, progressive from  prior. No pleural effusion or pneumothorax.   IMPRESSION: Chronic interstitial lung disease with increasing interstitial opacities within both lung bases, progressive from prior. Findings may represent a superimposed infectious or inflammatory process.     Electronically Signed   By: Davina Poke D.O.   On: 07/30/2021 12:31    Assessment & Plan:   Problem List Items Addressed This Visit   None Visit Diagnoses     COPD with acute exacerbation ([place])    -  Primary   Relevant Medications   levofloxacin (LEVAQUIN) 500 MG tablet   predniSONE (DELTASONE) 20 MG tablet   Other Relevant Orders   DG Chest 2 View (Completed)   Shortness of breath       Relevant Orders   DG Chest 2 View (Completed)   COVID-19, Flu A+B and RSV   Productive cough       Relevant Orders   DG Chest 2 View (Completed)   COVID-19, Flu A+B and RSV      AECOPD vs superimposed pneumonia Acute URI Pulm Fibrosis  COVID, Flu RSV test today swab send out  CXR STAT  See above results. Concern for infiltrate/opacity  Will empirically start Levaquin 500 daily and Prednisone taper  Update patient within 24-48 hours on result.    Meds ordered this encounter  Medications   levofloxacin (LEVAQUIN) 500 MG tablet    Sig: Take 1 tablet (500 mg total) by mouth daily. For 7 days    Dispense:  7 tablet    Refill:  0   predniSONE (DELTASONE) 20 MG tablet    Sig: Take daily with food. Start with 60mg  (3 pills) x 2 days, then reduce to 40mg  (2 pills) x 2 days, then 20mg  (1 pill) x 3 days    Dispense:  13 tablet    Refill:  0      Follow up plan: Return if symptoms worsen or fail to improve.  Nobie Putnam, [facility] 07/30/2021, 11:44 AM

## 2021-07-30 NOTE — Telephone Encounter (Signed)
  Chief Complaint: shortness of breath with green mucus.  Pulmonary fibrosis Symptoms: coughing up green mucus, shortness of breath with chest feeling tight and sore from coughing so much  Some dizziness with getting up. Frequency: constant Pertinent Negatives: Patient denies Cardiac issues.   Chest hurting form coughing so much Disposition: [] ED /[] Urgent Care (no appt availability in office) / ApptAppointment(In office/virtual)/ []  [place]/ [] Home Care/ [] Refused Recommended Disposition  Additional Notes: Appt for today with Dr. Parks Ranger at 11:20 [place] office.    Thanks Apolonio Schneiders.

## 2021-07-31 ENCOUNTER — Encounter: Payer: Self-pay | Admitting: Adult Health

## 2021-07-31 ENCOUNTER — Telehealth: Payer: Self-pay

## 2021-07-31 ENCOUNTER — Ambulatory Visit (INDEPENDENT_AMBULATORY_CARE_PROVIDER_SITE_OTHER): Payer: Medicare HMO | Admitting: Adult Health

## 2021-07-31 DIAGNOSIS — J189 Pneumonia, unspecified organism: Secondary | ICD-10-CM | POA: Diagnosis not present

## 2021-07-31 DIAGNOSIS — J441 Chronic obstructive pulmonary disease with (acute) exacerbation: Secondary | ICD-10-CM | POA: Diagnosis not present

## 2021-07-31 DIAGNOSIS — J849 Interstitial pulmonary disease, unspecified: Secondary | ICD-10-CM | POA: Diagnosis not present

## 2021-07-31 NOTE — Patient Instructions (Addendum)
Start Levaquin and Prednisone .  Liquid Mucinex DM 2 tsp Twice daily  As needed   Fluids and rest  Call if Viral swab returns positive .  Follow up in 3 days at [facility] office and As needed   Please contact office for sooner follow up if symptoms do not improve or worsen or seek emergency care

## 2021-07-31 NOTE — Telephone Encounter (Signed)
Patients results were sent to [facility] and he should have been contacted yesterday with this information. Rx for antibiotic and steroid were sent to pharmacy yesterday afternoon around 5pm.  I am unsure as to why they were unaware of the results still today.  I have just called and spoke to [facility], she said that they were unaware and didn't know about the medications. They went to the [facility] Pulmonology office today. They plan to pick up medicines now.  Nobie Putnam, DO [facility] 07/31/2021, 5:13 PM

## 2021-07-31 NOTE — Telephone Encounter (Signed)
Copied from [place] [id number]. Topic: General - Other >> Jul 31, 2021 10:52 AM Tessa Lerner A wrote: Reason for CRM: The patient's wife has called to request additional discussion with a member of clinical staff related to patient's lung discomfort  The patient's wife would like to know if the patient will be prescribed nay medication to help treat the discomfort the patient is experiencing in their chest/ lungs   The patient's wife would like to also be contacted when the imaging results from 07/30/21 are returned   Please contact further when possible

## 2021-07-31 NOTE — Progress Notes (Signed)
@Patient  ID: Lawrence Wells, male    DOB: [birth year], 65 y.o.   MRN: [id number]  Chief Complaint  Patient presents with   Follow-up    Referring provider: Nobie Putnam *  HPI: 65 year old male former smoker seen for pulmonary consult September 10, 2019 for abnormal CT chest with interstitial lung disease and lung nodule.  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   HRCT chest September 14, 2019 showed widespread areas of septal thickening, subpleural reticulation, parenchymal banding and traction bronchiectasis with extensive honeycombing most severe in the left lung with a definite craniocaudal gradient.  Moderate emphysema.  Consistent with a UIP pattern.  January 20, 2020 HRCT chest ILD changes with UIP pattern appears stable  PET scan March 14, 2020 showed right palatine tonsil markedly hypermetabolic, low level hypermetabolism in the bilateral cervical nodes, no typical findings of extra cervical metastatic disease.  Thoracic adenopathy and low-level hypermetabolism possibly reactive and secondary to ILD.   07/31/2021 Acute OV : Cough  Patient presents for an acute office visit.  Patient complains over the last 5 days he has had increased cough, congestion and severe coughing paroxysms with pleuritic pain especially on the left.  Patient was seen by his primary care provider yesterday and treated for COPD exacerbation with Levaquin and prednisone.  COVID-19 flu and RSV swabs are pending  Patient just recently found out that his antibiotic and prednisone were ready at the pharmacy.  Patient is using over-the-counter cold products for cough and congestion.  He denies any hemoptysis chest pain orthopnea PND or increased leg swelling.  No fever or body aches Chest x-ray yesterday showed chronic interstitial lung disease with  increased opacities in the lower lungs possibly superimposed infectious process.   Allergies  Allergen Reactions   Atorvastatin Other (See Comments)    Myalgias    Immunization History  Administered Date(s) Administered   Influenza, High Dose Seasonal PF 07/28/2020   PFIZER(Purple Top)SARS-COV-2 Vaccination 11/22/2019, 12/15/2019   Pneumococcal Conjugate-13 09/08/2019   Tdap 04/19/2015, 05/07/2017   Zoster Recombinat (Shingrix) 09/29/2019, 11/27/2019    Past Medical History:  Diagnosis Date   Arthritis    Collagen vascular disease ([place])    Rhematoid Arthritis   COPD (chronic obstructive pulmonary disease) (HCC)    Coronary artery disease    Dyspnea    Emphysema lung (HCC)    Hyperlipidemia    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     azaTHIOprine (IMURAN) 50 MG tablet Take 2 tablets (100 mg total) by mouth daily. 180 tablet 0   ezetimibe (ZETIA) 10 MG tablet Take 1 tablet (10 mg total) by mouth daily. 90 tablet 3   hydroxychloroquine (PLAQUENIL) 200 MG tablet Take 1 tablet by mouth daily.     levofloxacin (LEVAQUIN)  500 MG tablet Take 1 tablet (500 mg total) by mouth daily. For 7 days 7 tablet 0   predniSONE (DELTASONE) 20 MG tablet Take daily with food. Start with 60mg  (3 pills) x 2 days, then reduce to 40mg  (2 pills) x 2 days, then 20mg  (1 pill) x 3 days 13 tablet 0   No facility-administered medications prior to visit.     Review of Systems:    Constitutional:   No  weight loss, night sweats,  Fevers, chills,  +fatigue, or  lassitude.  HEENT:   No headaches,  Difficulty swallowing,  Tooth/dental problems, or  Sore throat,                No sneezing, itching, ear ache, +nasal congestion, post nasal drip,   CV:  No chest pain,  Orthopnea, PND, swelling in lower extremities, anasarca, dizziness, palpitations, syncope.   GI  No heartburn, indigestion, abdominal pain, nausea, vomiting, diarrhea, change in bowel habits, loss of appetite, bloody stools.   Resp:   No chest wall deformity  Skin: no rash or lesions.  GU: no dysuria, change in color of urine, no urgency or frequency.  No flank pain, no hematuria   MS:  No joint pain or swelling.  No decreased range of motion.  No back pain.    Physical Exam  BP 100/60 (BP Location: Left Arm, Patient Position: Sitting, Cuff Size: Normal)    Pulse 90    Temp (!) 97.5 F (36.4 C) (Oral)    Ht 5\' 11"  (1.803 m)    Wt 126 lb (57.2 kg)    SpO2 90%    BMI 17.57 kg/m   GEN: A/Ox3; pleasant , NAD, well nourished    HEENT:  [place]/AT,  EACs-clear, TMs-wnl, NOSE-clear, THROAT-clear, no lesions, no postnasal drip or exudate noted.   NECK:  Supple w/ fair ROM; no JVD; normal carotid impulses w/o bruits; no thyromegaly or nodules palpated; no lymphadenopathy.    RESP bibasilar crackles, few scattered rhonchi  no accessory muscle use, no dullness to percussion  CARD:  RRR, no m/r/g, no peripheral edema, pulses intact, no cyanosis or clubbing.  GI:   Soft & nt; nml bowel sounds; no organomegaly or masses detected.   Musco: Warm bil, no deformities or joint swelling noted.   Neuro: alert, no focal deficits noted.    Skin: Warm, no lesions or rashes    Lab Results:  CBC     BNP    Imaging: DG Chest 2 View  Result Date: 07/30/2021 CLINICAL DATA:  Productive cough.  COPD EXAM: CHEST - 2 VIEW COMPARISON:  01/18/2021 FINDINGS: Stable cardiomediastinal contours. Advanced chronic  changes of interstitial lung disease with basilar predominance, left worse than right. Increasing interstitial opacities within both lung bases, progressive from prior. No pleural effusion or pneumothorax. IMPRESSION: Chronic interstitial lung disease with increasing interstitial opacities within both lung bases, progressive from prior. Findings may represent a superimposed infectious or inflammatory process. Electronically Signed   By: Davina Poke D.O.   On: 07/30/2021 12:31      No flowsheet data found.  No results found for: NITRICOXIDE      Assessment & Plan:   No problem-specific Assessment & Plan notes found for this encounter.     Rexene Edison, NP 07/31/2021

## 2021-08-01 LAB — COVID-19, FLU A+B AND RSV
Influenza A, NAA: NOT DETECTED
Influenza B, NAA: NOT DETECTED
RSV, NAA: NOT DETECTED
SARS-CoV-2, NAA: NOT DETECTED

## 2021-08-01 LAB — SPECIMEN STATUS REPORT

## 2021-08-02 DIAGNOSIS — J189 Pneumonia, unspecified organism: Secondary | ICD-10-CM | POA: Insufficient documentation

## 2021-08-02 DIAGNOSIS — J441 Chronic obstructive pulmonary disease with (acute) exacerbation: Secondary | ICD-10-CM | POA: Insufficient documentation

## 2021-08-02 NOTE — Assessment & Plan Note (Signed)
Chest x-ray shows possible pneumonia.  Recommend beginning antibiotics as directed. Close follow-up  Plan  Patient Instructions  Start Levaquin and Prednisone .  Liquid Mucinex DM 2 tsp Twice daily  As needed   Fluids and rest  Call if Viral swab returns positive .  Follow up in 3 days at [facility] office and As needed   Please contact office for sooner follow up if symptoms do not improve or worsen or seek emergency care

## 2021-08-02 NOTE — Assessment & Plan Note (Signed)
Appears stable we will continue to monitor closely.  Plan Patient Instructions  Start Levaquin and Prednisone .  Liquid Mucinex DM 2 tsp Twice daily  As needed   Fluids and rest  Call if Viral swab returns positive .  Follow up in 3 days at [facility] office and As needed   Please contact office for sooner follow up if symptoms do not improve or worsen or seek emergency care

## 2021-08-02 NOTE — Assessment & Plan Note (Signed)
Acute COPD flare with probable superimposed pneumonia  Plan  Patient Instructions  Start Levaquin and Prednisone .  Liquid Mucinex DM 2 tsp Twice daily  As needed   Fluids and rest  Call if Viral swab returns positive .  Follow up in 3 days at [facility] office and As needed   Please contact office for sooner follow up if symptoms do not improve or worsen or seek emergency care

## 2021-08-03 ENCOUNTER — Other Ambulatory Visit: Payer: Self-pay

## 2021-08-03 ENCOUNTER — Ambulatory Visit (INDEPENDENT_AMBULATORY_CARE_PROVIDER_SITE_OTHER): Payer: Medicare HMO | Admitting: Adult Health

## 2021-08-03 ENCOUNTER — Encounter: Payer: Self-pay | Admitting: Adult Health

## 2021-08-03 DIAGNOSIS — J189 Pneumonia, unspecified organism: Secondary | ICD-10-CM

## 2021-08-03 DIAGNOSIS — M0579 Rheumatoid arthritis with rheumatoid factor of multiple sites without organ or systems involvement: Secondary | ICD-10-CM

## 2021-08-03 DIAGNOSIS — J849 Interstitial pulmonary disease, unspecified: Secondary | ICD-10-CM

## 2021-08-03 NOTE — Patient Instructions (Addendum)
Finish Levaquin and Prednisone .  Liquid Mucinex DM 2 tsp Twice daily  As needed   High protein diet .  Flu shot next week as discussed.  Follow up with in 3 weeks in [place] office with chest xray  Please contact office for sooner follow up if symptoms do not improve or worsen or seek emergency care

## 2021-08-03 NOTE — Assessment & Plan Note (Signed)
Connective tissue disease continue follow-up with rheumatology.

## 2021-08-03 NOTE — Assessment & Plan Note (Signed)
Bibasilar pneumonia-clinically improving on antibiotics Check chest x-ray in 3 weeks  Plan  Patient Instructions  Finish Levaquin and Prednisone .  Liquid Mucinex DM 2 tsp Twice daily  As needed   High protein diet .  Flu shot next week as discussed.  Follow up with in 3 weeks in [place] office with chest xray  Please contact office for sooner follow up if symptoms do not improve or worsen or seek emergency care

## 2021-08-03 NOTE — Progress Notes (Signed)
@Patient  ID: Lawrence Wells, male    DOB: July 27, 1956, 65 y.o.   MRN: [id number]  Chief Complaint  Patient presents with   Follow-up    Referring provider: Nobie Putnam *  HPI: 65 year old male former smoker seen for pulmonary consult September 10, 2019 for abnormal CT chest with interstitial lung disease and lung nodule.  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    HRCT chest September 14, 2019 showed widespread areas of septal thickening, subpleural reticulation, parenchymal banding and traction bronchiectasis with extensive honeycombing most severe in the left lung with a definite craniocaudal gradient.  Moderate emphysema.  Consistent with a UIP pattern.   January 20, 2020 HRCT chest ILD changes with UIP pattern appears stable   PET scan March 14, 2020 showed right palatine tonsil markedly hypermetabolic, low level hypermetabolism in the bilateral cervical nodes, no typical findings of extra cervical metastatic disease.  Thoracic adenopathy and low-level hypermetabolism possibly reactive and secondary to ILD.     08/03/2021 Follow up : Pneumonia , ILD  Patient returns for a 3-day follow-up.  Patient was seen earlier this week for an acute bronchitis plus or minus pneumonia.  He had been seen by his primary care provider started on Levaquin and Prednisone . RSV, Covid and Flu swab were negative .  Chest x-ray showed increasing bibasilar lung opacities suspicious for superimposed infection.  Since last visit patient says he is feeling better, cough and congestion is decreased. Coughing up minimal congestion.  Patient is not on oxygen.  O2 saturations today are 96% on room air.  Has CTD follows with Rheumatology . On Plaquenil and Imuran.   Covid vaccine x 2 , no booster.  No flu vaccine .  Discussed getting flu and booster  in next few weeks.     Allergies  Allergen Reactions   Atorvastatin Other (See Comments)    Myalgias    Immunization History  Administered Date(s) Administered   Influenza, High Dose Seasonal PF 07/28/2020   PFIZER(Purple Top)SARS-COV-2 Vaccination 11/22/2019, 12/15/2019   Pneumococcal Conjugate-13 09/08/2019   Tdap 04/19/2015, 05/07/2017   Zoster Recombinat (Shingrix) 09/29/2019, 11/27/2019    Past Medical History:  Diagnosis Date   Arthritis    Collagen vascular disease ([place])    Rhematoid Arthritis   COPD (chronic obstructive pulmonary disease) (HCC)    Coronary artery disease    Dyspnea    Emphysema lung (HCC)    Hyperlipidemia    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 180 tablet 0   ezetimibe (ZETIA) 10 MG tablet Take 1 tablet (10 mg total) by mouth daily. 90 tablet 3   hydroxychloroquine (PLAQUENIL) 200 MG tablet Take 1 tablet by mouth daily.     levofloxacin (LEVAQUIN) 500 MG tablet  Take 1 tablet (500 mg total) by mouth daily. For 7 days 7 tablet 0   predniSONE (DELTASONE) 20 MG tablet Take daily with food. Start with 60mg  (3 pills) x 2 days, then reduce to 40mg  (2 pills) x 2 days, then 20mg  (1 pill) x 3 days 13 tablet 0   atorvastatin (LIPITOR) 10 MG tablet Take 10 mg by mouth daily.     No facility-administered medications prior to visit.     Review of Systems:   Constitutional:   No  weight loss, night sweats,   Fevers, chills,  +fatigue, or  lassitude.  HEENT:   No headaches,  Difficulty swallowing,  Tooth/dental problems, or  Sore throat,                No sneezing, itching, ear ache, nasal congestion, post nasal drip,   CV:  No chest pain,  Orthopnea, PND, swelling in lower extremities, anasarca, dizziness, palpitations, syncope.   GI  No heartburn, indigestion, abdominal pain, nausea, vomiting, diarrhea, change in bowel habits, loss of appetite, bloody stools.   Resp:   No chest wall deformity  Skin: no rash or lesions.  GU: no dysuria, change in color of urine, no urgency or frequency.  No flank pain, no hematuria   MS:  No joint pain or swelling.  No decreased range of motion.  No back pain.    Physical Exam  BP 118/64 (BP Location: Left Arm, Patient Position: Sitting, Cuff Size: Normal)    Pulse 76    Temp 98.4 F (36.9 C) (Oral)    Ht 5\' 11"  (1.803 m)    Wt 126 lb 3.2 oz (57.2 kg)    SpO2 96%    BMI 17.60 kg/m   GEN: A/Ox3; pleasant , NAD, thin male    HEENT:  [place]/AT,  NOSE-clear, THROAT-clear, no lesions, no postnasal drip or exudate noted.   NECK:  Supple w/ fair ROM; no JVD; normal carotid impulses w/o bruits; no thyromegaly or nodules palpated; no lymphadenopathy.    RESP  BB crackles  no accessory muscle use, no dullness to percussion  CARD:  RRR, no m/r/g, no peripheral edema, pulses intact, no cyanosis or clubbing.  GI:   Soft & nt; nml bowel sounds; no organomegaly or masses detected.   Musco: Warm bil, no deformities or joint swelling noted.   Neuro: alert, no focal deficits noted.    Skin: Warm, no lesions or rashes    Lab Results:    BNP   Imaging: DG Chest 2 View  Result Date: 07/30/2021 CLINICAL DATA:  Productive cough.  COPD EXAM: CHEST - 2 VIEW COMPARISON:  01/18/2021 FINDINGS: Stable cardiomediastinal contours. Advanced chronic changes of interstitial lung disease with basilar predominance, left worse than right. Increasing interstitial  opacities within both lung bases, progressive from prior. No pleural effusion or pneumothorax. IMPRESSION: Chronic interstitial lung disease with increasing interstitial opacities within both lung bases, progressive from prior. Findings may represent a superimposed infectious or inflammatory process. Electronically Signed   By: Davina Poke D.O.   On: 07/30/2021 12:31      No flowsheet data found.  No results found for: NITRICOXIDE      Assessment & Plan:   CAP (community acquired pneumonia) Bibasilar pneumonia-clinically improving on antibiotics Check chest x-ray in 3 weeks  Plan  Patient Instructions  Finish Levaquin and Prednisone .  Liquid Mucinex DM 2 tsp Twice daily  As needed   High protein diet .  Flu shot next week  as discussed.  Follow up with in 3 weeks in [place] office with chest xray  Please contact office for sooner follow up if symptoms do not improve or worsen or seek emergency care          ILD (interstitial lung disease) ([place]) Appears stable without flare.  O2 saturations are at baseline. Continue on current regimen Recommend influenza vaccine next week. Will discuss COVID-vaccine on return in January  Rheumatoid arthritis involving multiple sites with positive rheumatoid factor ([place]) Connective tissue disease continue follow-up with rheumatology.     Rexene Edison, NP 08/03/2021

## 2021-08-03 NOTE — Assessment & Plan Note (Addendum)
Appears stable without flare.  O2 saturations are at baseline. Continue on current regimen Recommend influenza vaccine next week. Will discuss COVID-vaccine on return in January

## 2021-08-10 ENCOUNTER — Other Ambulatory Visit: Payer: Self-pay

## 2021-08-10 ENCOUNTER — Ambulatory Visit (INDEPENDENT_AMBULATORY_CARE_PROVIDER_SITE_OTHER): Payer: Medicare HMO

## 2021-08-10 VITALS — Ht 71.0 in | Wt 126.0 lb

## 2021-08-10 DIAGNOSIS — J84112 Idiopathic pulmonary fibrosis: Secondary | ICD-10-CM | POA: Diagnosis not present

## 2021-08-10 DIAGNOSIS — Z79899 Other long term (current) drug therapy: Secondary | ICD-10-CM | POA: Diagnosis not present

## 2021-08-10 DIAGNOSIS — M0579 Rheumatoid arthritis with rheumatoid factor of multiple sites without organ or systems involvement: Secondary | ICD-10-CM | POA: Diagnosis not present

## 2021-08-10 DIAGNOSIS — Z23 Encounter for immunization: Secondary | ICD-10-CM | POA: Diagnosis not present

## 2021-08-27 ENCOUNTER — Ambulatory Visit
Admission: RE | Admit: 2021-08-27 | Discharge: 2021-08-27 | Disposition: A | Discharge status: Home / Self Care | Payer: Medicare PPO | Source: Ambulatory Visit | Attending: Adult Health | Admitting: Adult Health

## 2021-08-27 ENCOUNTER — Encounter: Payer: Self-pay | Admitting: Adult Health

## 2021-08-27 ENCOUNTER — Other Ambulatory Visit: Payer: Self-pay

## 2021-08-27 ENCOUNTER — Ambulatory Visit (INDEPENDENT_AMBULATORY_CARE_PROVIDER_SITE_OTHER): Payer: Medicare PPO | Admitting: Adult Health

## 2021-08-27 VITALS — BP 100/80 | HR 58 | Temp 97.6°F | Ht 71.0 in | Wt 129.8 lb

## 2021-08-27 DIAGNOSIS — J189 Pneumonia, unspecified organism: Secondary | ICD-10-CM | POA: Insufficient documentation

## 2021-08-27 DIAGNOSIS — J849 Interstitial pulmonary disease, unspecified: Secondary | ICD-10-CM

## 2021-08-27 DIAGNOSIS — J441 Chronic obstructive pulmonary disease with (acute) exacerbation: Secondary | ICD-10-CM

## 2021-08-27 DIAGNOSIS — M0579 Rheumatoid arthritis with rheumatoid factor of multiple sites without organ or systems involvement: Secondary | ICD-10-CM | POA: Diagnosis not present

## 2021-08-27 NOTE — Patient Instructions (Signed)
Liquid Mucinex DM 2 tsp Twice daily  As needed   High protein diet .  Chest xray today  Follow up with in 3-4 months with Dr. Chase Caller in [place] office with PFT (spirometry with DLCO )  Please contact office for sooner follow up if symptoms do not improve or worsen or seek emergency care

## 2021-08-27 NOTE — Assessment & Plan Note (Signed)
Appears stable.  Patient was unable to tolerate Ofev in the past due to weight loss.  Patient appears clinically stable.  Remains active.  Is now off of oxygen over the last year. Patient needs follow-up PFTs on return.  We will check spirometry with DLCO. We discussed pulmonary rehab.  He has completed this in the past and does not wish to go to this currently.  Plan  Patient Instructions  Liquid Mucinex DM 2 tsp Twice daily  As needed   High protein diet .  Chest xray today  Follow up with in 3-4 months with Dr. Chase Caller in [place] office with PFT (spirometry with DLCO )  Please contact office for sooner follow up if symptoms do not improve or worsen or seek emergency care

## 2021-08-27 NOTE — Progress Notes (Signed)
@Patient  ID: Lawrence Wells, male    DOB: 1956/04/04, 66 y.o.   MRN: [id number]  Chief Complaint  Patient presents with   Follow-up    Referring provider: Nobie Putnam *  HPI: 66 year old male former smoker seen for pulmonary consult September 10, 2019 for abnormal CT chest with interstitial lung disease and lung nodule.  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 COVID-19 infection June 2021  TEST/EVENTS :  Unable to tolerate OFEV  due to weight loss.    HRCT chest September 14, 2019 showed widespread areas of septal thickening, subpleural reticulation, parenchymal banding and traction bronchiectasis with extensive honeycombing most severe in the left lung with a definite craniocaudal gradient.  Moderate emphysema.  Consistent with a UIP pattern.   January 20, 2020 HRCT chest ILD changes with UIP pattern appears stable   PET scan March 14, 2020 showed right palatine tonsil markedly hypermetabolic, low level hypermetabolism in the bilateral cervical nodes, no typical findings of extra cervical metastatic disease.  Thoracic adenopathy and low-level hypermetabolism possibly reactive and secondary to ILD.  PET scan August 30, 2020 no definite residual mass or tumor within the region of the right tonsillar extending along the mucosal surface into the right vallecula.  No suspicious lymphadenopathy noted. Positive emphysema  08/27/2021 Follow up ; RA related ILD, pneumonia Patient returns for 1 month follow-up.  Patient was seen last visit with a slow to resolve acute bronchitis plus or minus pneumonia.  Patient had been seen by his primary care provider.  RSV, COVID and flu swabs were negative.  Chest x-ray showed increasing bibasilar lung opacities.   Suspicious for possible superimposed infection. Patient does have rheumatoid arthritis and is on Plaquenil and Imuran. Patient was recommended to begin Levaquin and a prednisone taper.  Since last visit patient is feeling much better.  Cough and congestion have decreased substantially.  Patient says he is back to baseline feeling good.  Patient says he remains very active at home.  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  [facility].  PEG tube was removed in February 2022. Was previously on oxygen but no longer using. Last  used 10/2020. Sent back to DME>   Flu shot is utd.   Remains active at home , works in yard and garden. Hobbies . Very active  Has completed pulmonary rehab in past.   Allergies  Allergen Reactions   Atorvastatin Other (See Comments)    Myalgias    Immunization History  Administered Date(s) Administered   Fluad Quad(high Dose 65+) 08/10/2021   Influenza, High Dose Seasonal PF 07/28/2020   PFIZER(Purple Top)SARS-COV-2 Vaccination 11/22/2019, 12/15/2019   Pneumococcal Conjugate-13 09/08/2019   Tdap 04/19/2015, 05/07/2017   Zoster Recombinat (Shingrix) 09/29/2019, 11/27/2019    Past Medical History:  Diagnosis Date   Arthritis    Collagen vascular disease ([place])    Rhematoid Arthritis   COPD (chronic obstructive pulmonary disease) ([place])    Coronary artery disease    Dyspnea    Emphysema lung (HCC)    Hyperlipidemia    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 180 tablet 0   ezetimibe (ZETIA) 10 MG tablet Take 1 tablet (10 mg total) by mouth daily. 90 tablet 3   hydroxychloroquine (PLAQUENIL) 200 MG tablet Take 1 tablet by mouth daily.     levofloxacin (LEVAQUIN) 500 MG tablet Take 1 tablet (500 mg total) by mouth daily. For 7 days 7 tablet 0   predniSONE (DELTASONE) 20 MG tablet Take daily with food. Start with 60mg  (3 pills) x 2 days, then reduce to 40mg  (2 pills) x 2 days, then 20mg  (1 pill) x 3 days 13 tablet 0   No facility-administered medications prior to visit.     Review of Systems:   Constitutional:   No  weight loss, night sweats,  Fevers, chills,  +fatigue, or  lassitude.  HEENT:   No headaches,  Difficulty swallowing,  Tooth/dental problems, or  Sore throat,                No sneezing, itching, ear ache, nasal congestion, post nasal drip,   CV:  No chest pain,  Orthopnea, PND, swelling in lower extremities, anasarca, dizziness, palpitations, syncope.   GI  No heartburn, indigestion, abdominal pain, nausea, vomiting, diarrhea, change in bowel habits, loss of appetite, bloody stools.   Resp:   No excess mucus, no productive cough,  No non-productive cough,  No coughing up of blood.  No change in color of mucus.  No wheezing.  No chest wall deformity  Skin: no rash or lesions.  GU: no dysuria, change in color of urine, no urgency or frequency.  No flank pain, no hematuria   MS:  No joint pain or swelling.  No decreased range of motion.  No back pain.    Physical Exam  BP 100/80 (BP Location: Left Arm, Patient Position: Sitting, Cuff Size: Normal)    Pulse (!) 58    Temp 97.6 F (36.4 C) (Oral)    Ht 5\' 11"  (1.803 m)    Wt 129 lb 12.8 oz (58.9 kg)    SpO2 96%    BMI 18.10  kg/m   GEN: A/Ox3; pleasant , NAD, thin male    HEENT:  [place]/AT,  NOSE-clear, THROAT-clear, no lesions, no postnasal drip or exudate noted.   NECK:  Supple w/ fair ROM; no JVD; normal carotid impulses w/o bruits; no thyromegaly or nodules palpated; no lymphadenopathy.    RESP  faint BB crackles , no accessory muscle use, no dullness to percussion  CARD:  RRR, no m/r/g, no peripheral edema, pulses intact, no cyanosis or clubbing.  GI:   Soft & nt; nml bowel sounds; no organomegaly or masses detected.   Musco: Warm bil, no deformities or joint swelling noted.   Neuro: alert, no focal deficits noted.    Skin: Warm, no lesions or rashes    Lab Results:    BMET   BNP  Imaging: DG Chest 2 View  Result Date: 07/30/2021 CLINICAL DATA:  Productive cough.  COPD EXAM: CHEST - 2 VIEW COMPARISON:  01/18/2021 FINDINGS: Stable cardiomediastinal contours. Advanced chronic changes of interstitial lung disease with basilar predominance, left worse than right. Increasing interstitial opacities within both lung bases, progressive from prior. No pleural effusion or pneumothorax. IMPRESSION: Chronic interstitial lung disease with increasing interstitial opacities within both lung bases, progressive from prior. Findings may represent a superimposed infectious or inflammatory process. Electronically Signed   By: Davina Poke D.O.   On: 07/30/2021 12:31      No flowsheet data found.  No results found for: NITRICOXIDE      Assessment & Plan:   ILD (interstitial lung disease) ([place]) Appears stable.  Patient was unable to tolerate Ofev in the past due to weight loss.  Patient appears clinically stable.  Remains active.  Is now off of oxygen over the last year. Patient needs follow-up PFTs on return.  We will check spirometry with [facility]CO. We discussed pulmonary rehab.  He has completed this in the past and does not wish to go to this currently.  Plan  Patient Instructions  Liquid Mucinex  DM 2 tsp Twice daily  As needed   High protein diet .  Chest xray today  Follow up with in 3-4 months with Dr. Chase Caller in [place] office with PFT (spirometry with DLCO )  Please contact office for sooner follow up if symptoms do not improve or worsen or seek emergency care          Rheumatoid arthritis involving multiple sites with positive rheumatoid factor ([place]) Continue on current regimen and follow-up with rheumatology.  COPD with acute exacerbation (HCC) Recent flare with acute bronchitis.  Patient is much improved after antibiotics and steroids.  Patient may have had some underlying pneumonia as chest x-ray showed increased opacity suspicious for superimposed infection.  Chest x-ray today.  Patient is clinically much improved.  Plan  Patient Instructions  Liquid Mucinex DM 2 tsp Twice daily  As needed   High protein diet .  Chest xray today  Follow up with in 3-4 months with Dr. Chase Caller in [place] office with PFT ([facility]rometry with [facility] )  Please contact office for sooner follow up if symptoms do not improve or worsen or seek emergency care            Rexene Edison, NP 08/27/2021

## 2021-08-27 NOTE — Assessment & Plan Note (Signed)
Continue on current regimen and follow-up with rheumatology.

## 2021-08-27 NOTE — Assessment & Plan Note (Signed)
Recent flare with acute bronchitis.  Patient is much improved after antibiotics and steroids.  Patient may have had some underlying pneumonia as chest x-ray showed increased opacity suspicious for superimposed infection.  Chest x-ray today.  Patient is clinically much improved.  Plan  Patient Instructions  Liquid Mucinex DM 2 tsp Twice daily  As needed   High protein diet .  Chest xray today  Follow up with in 3-4 months with Dr. Chase Caller in [place] office with PFT (spirometry with DLCO )  Please contact office for sooner follow up if symptoms do not improve or worsen or seek emergency care

## 2021-09-03 ENCOUNTER — Other Ambulatory Visit: Payer: Self-pay | Admitting: Cardiovascular Disease

## 2021-09-03 NOTE — Telephone Encounter (Signed)
Please contact pt for future appointment. Pt due for 6 month f/u. 

## 2021-09-23 NOTE — Progress Notes (Signed)
Cardiology Office Note  Date:  09/24/2021   ID:  Lawrence Wells, DOB [birth year], MRN [id number]  PCP:  Olin Hauser, DO   Chief Complaint  Patient presents with   Follow-up    "Doing well." Medications reviewed by the patient verbally.     HPI:  [name] is a 66 yo male with PMH of  Hyperlipidemia, notes indicating intolerance of Lipitor and Crestor, able to tolerate Zetia Former smoker stopped 2017 ILD/Pulmonary fibrosis, chronic shortness of breath Possible lung transplant candidate, previously evaluated at [facility] June 2021 on Eliquis History of right tonsillar squamous cell carcinoma, chemoradiation Is for follow-up of his coronary calcification on CT scan, paroxysmal atrial fibrillation  Last seen in clinic by myself February 2022 Down 13 pounds in one year Difficulty swallowing certain things such as bread Thought he was doing well with his calorie intake but weight has continued to trend downward Wife who presents with him today reports he has lost much of his muscle mass  Denies any chest pain or shortness of breath on exertion Out of his Zetia, wondering what his cholesterol is running given weight loss  EKG personally reviewed by myself on todays visit Normal sinus rhythm rate 59 bpm left axis deviation, unable to exclude old anteroseptal MI  Other past medical history reviewed Last seen in clinic August 2021 History of diffuse ILD along with left upper lobe nodule.   Followed by pulmonary On oxygen, some coughing  History of right tonsillar squamous cell carcinoma, chemoradiation On prior visits had weight loss 35 pounds, difficulty swallowing  Weight in aug 2021 145 pounds Could not eat, " I was dying" Weight down to 125, had a feeding tube placed Cancer treatment completed , weight rebounded  CT scan chest 09/14/2019 Minimal carotid and aortic atherosclerosis noted  moderate diffuse coronary calcification noted LAD and RCA    echocardiogram with ejection fraction 50%, moderate RV systolic dysfunction Anterior wall hypokinesis,  PMH:   has a past medical history of Arthritis, Collagen vascular disease ([place]), COPD (chronic obstructive pulmonary disease) ([place]), Coronary artery disease, Dyspnea, Emphysema lung ([place]), Hyperlipidemia, and 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[place]).  PSH:    Past Surgical History:  Procedure Laterality Date   SINUS SURGERY WITH INSTATRAK      Current Outpatient Medications  Medication Sig Dispense Refill   azaTHIOprine (IMURAN) 50 MG tablet Take 2 tablets (100 mg total) by mouth daily. 180 tablet 0   hydroxychloroquine (PLAQUENIL) 200 MG tablet Take 1 tablet by mouth daily.     Multiple Vitamins-Minerals (CENTRUM SILVER 50+MEN PO) Take by mouth daily.     ezetimibe (ZETIA) 10 MG tablet Take 1 tablet (10 mg total) by mouth daily. 90 tablet 3   No current facility-administered medications for this visit.    Allergies:   Atorvastatin   Social History:  The patient  reports that he quit smoking about 16 years ago. His smoking use included cigarettes. He has a 45.00 pack-year smoking history. He quit smokeless tobacco use about 16 years ago.  His smokeless tobacco use included chew. He reports that he does not currently use alcohol. He reports that he does not use drugs.   Family History:   family history includes Arthritis in his mother; Healthy in his daughter, sister, and son; Heart attack (age of onset: 52) in his father; Heart attack (age of onset: 37) in his mother; Heart disease in his brother, father, and mother; Hyperlipidemia in his brother; Throat cancer (age of onset:  22) in his brother.   Review of Systems: Review of Systems  Constitutional: Negative.   HENT: Negative.    Respiratory: Negative.    Cardiovascular: Negative.   Gastrointestinal: Negative.        Throat pain  Musculoskeletal: Negative.   Neurological: Negative.   Psychiatric/Behavioral: Negative.    All other  systems reviewed and are negative.  PHYSICAL EXAM: VS:  BP 90/62 (BP Location: Left Arm, Patient Position: Sitting, Cuff Size: Normal)    Pulse (!) 59    Ht 5\' 11"  (1.803 m)    Wt 132 lb 6 oz (60 kg)    SpO2 96%    BMI 18.46 kg/m  , BMI Body mass index is 18.46 kg/m. Constitutional:  oriented to person, place, and time. No distress.  HENT:  Head: Grossly normal Eyes:  no discharge. No scleral icterus.  Neck: No JVD, no carotid bruits  Cardiovascular: Regular rate and rhythm, no murmurs appreciated Pulmonary/Chest: Clear to auscultation bilaterally, no wheezes or rails Abdominal: Soft.  no distension.  no tenderness.  Musculoskeletal: Normal range of motion Neurological:  normal muscle tone. Coordination normal. No atrophy Skin: Skin warm and dry Psychiatric: normal affect, pleasant  Recent Labs: No results found for requested labs within last 8760 hours.    Lipid Panel Lab Results  Component Value Date   CHOL 229 (H) 09/27/2020   HDL 56 09/27/2020   LDLCALC 137 (H) 09/27/2020   TRIG 203 (H) 09/27/2020      Wt Readings from Last 3 Encounters:  09/24/21 132 lb 6 oz (60 kg)  08/27/21 129 lb 12.8 oz (58.9 kg)  08/10/21 126 lb (57.2 kg)     ASSESSMENT AND PLAN:  Problem List Items Addressed This Visit       Cardiology Problems   Hypercholesterolemia   Relevant Medications   ezetimibe (ZETIA) 10 MG tablet   Coronary artery calcification   Relevant Medications   ezetimibe (ZETIA) 10 MG tablet     Other   Centrilobular emphysema (HCC)   Pulmonary fibrosis (HCC)   Former smoker   Other Visit Diagnoses     PAF (paroxysmal atrial fibrillation) ([place])    -  Primary   Relevant Medications   ezetimibe (ZETIA) 10 MG tablet   Other Relevant Orders   EKG 12-Lead      COPD/Pulmonary fibrosis, Previously on oxygen Prior transplant listing delayed secondary to cancer diagnosis and treatment Recently getting over bronchitis/pneumonia, had a difficult time, may have  lost more weight  Coronary artery disease Prior CT scan showing Heavy calcification LAD, RCA, some in the left circumflex Minimal in the aorta and carotids Prior stress test early 2021 with no ischemia Denies anginal symptoms We will recommend he have repeat lipid panel, he prefers to have this done with primary care  Hyperlipidemia Unable to tolerate Crestor Did not tolerate Lipitor,  Currently not on Zetia, has had another 13 pound weight loss -Recommend recheck lipid panel first Prefers to have this done with primary care as he lives around the corner  Paroxysmal atrial fibrillation Prior episode atrial fibrillation June 2021 in the setting of COVID, respiratory distress, Appears to be lone episode, Eliquis held No documentation of further arrhythmia    Total encounter time more than 25 minutes  Greater than 50% was spent in counseling and coordination of care with the patient   Signed, Esmond Plants, M.D., Ph.D. [place], [place]

## 2021-09-24 ENCOUNTER — Other Ambulatory Visit: Payer: Self-pay | Admitting: Family Medicine

## 2021-09-24 ENCOUNTER — Other Ambulatory Visit: Payer: Self-pay

## 2021-09-24 ENCOUNTER — Ambulatory Visit (INDEPENDENT_AMBULATORY_CARE_PROVIDER_SITE_OTHER): Payer: Medicare PPO | Admitting: Cardiovascular Disease

## 2021-09-24 ENCOUNTER — Encounter: Payer: Self-pay | Admitting: Cardiovascular Disease

## 2021-09-24 VITALS — BP 90/62 | HR 59 | Ht 71.0 in | Wt 132.4 lb

## 2021-09-24 DIAGNOSIS — Z87891 Personal history of nicotine dependence: Secondary | ICD-10-CM

## 2021-09-24 DIAGNOSIS — E7800 Pure hypercholesterolemia, unspecified: Secondary | ICD-10-CM | POA: Diagnosis not present

## 2021-09-24 DIAGNOSIS — J8410 Pulmonary fibrosis, unspecified: Secondary | ICD-10-CM | POA: Diagnosis not present

## 2021-09-24 DIAGNOSIS — I2584 Coronary atherosclerosis due to calcified coronary lesion: Secondary | ICD-10-CM

## 2021-09-24 DIAGNOSIS — J432 Centrilobular emphysema: Secondary | ICD-10-CM | POA: Diagnosis not present

## 2021-09-24 DIAGNOSIS — I2510 Atherosclerotic heart disease of native coronary artery without angina pectoris: Secondary | ICD-10-CM

## 2021-09-24 DIAGNOSIS — I700 Atherosclerosis of aorta: Secondary | ICD-10-CM

## 2021-09-24 DIAGNOSIS — I480 Paroxysmal atrial fibrillation: Secondary | ICD-10-CM

## 2021-09-24 MED ORDER — EZETIMIBE 10 MG PO TABS: 10.0000 mg | ORAL_TABLET | Freq: Every day | ORAL | 3 refills | Status: DC

## 2021-09-24 NOTE — Patient Instructions (Signed)
Medication Instructions:  No changes  If you need a refill on your cardiac medications before your next appointment, please call your pharmacy.   Lab work: No new labs needed  Testing/Procedures: No new testing needed  Follow-Up: At [facility], you and your health needs are our priority.  As part of our continuing mission to provide you with exceptional heart care, we have created designated Provider Care Teams.  These Care Teams include your primary Cardiologist (physician) and Advanced Practice Providers (APPs -  Physician Assistants and Nurse Practitioners) who all work together to provide you with the care you need, when you need it.  You will need a follow up appointment in 12 months  Providers on your designated Care Team:   Christopher Berge, NP Ryan Dunn, PA-C Cadence Furth, PA-C  COVID-19 Vaccine Information can be found at: [email or web address] For questions related to vaccine distribution or appointments, please email [email or web address] or call [phone].   

## 2021-09-26 ENCOUNTER — Other Ambulatory Visit: Payer: Medicare HMO

## 2021-09-26 ENCOUNTER — Other Ambulatory Visit: Payer: Self-pay

## 2021-09-26 DIAGNOSIS — I700 Atherosclerosis of aorta: Secondary | ICD-10-CM

## 2021-09-26 DIAGNOSIS — E7800 Pure hypercholesterolemia, unspecified: Secondary | ICD-10-CM | POA: Diagnosis not present

## 2021-09-26 LAB — LIPID PANEL
Cholesterol: 204 mg/dL — ABNORMAL HIGH (ref <200)
HDL: 83 mg/dL (ref >=40)
LDL Cholesterol (Calc): 103 mg/dL (calc) — ABNORMAL HIGH
Non-HDL Cholesterol (Calc): 121 mg/dL (calc) (ref <130)
Total CHOL/HDL Ratio: 2.5 (calc) (ref <5.0)
Triglycerides: 85 mg/dL (ref <150)

## 2021-10-17 ENCOUNTER — Other Ambulatory Visit: Payer: Self-pay

## 2021-10-17 ENCOUNTER — Encounter: Payer: Self-pay | Admitting: Family Medicine

## 2021-10-17 ENCOUNTER — Ambulatory Visit: Payer: Medicare PPO | Admitting: Family Medicine

## 2021-10-17 VITALS — BP 96/59 | HR 72 | Ht 71.0 in | Wt 128.8 lb

## 2021-10-17 DIAGNOSIS — J441 Chronic obstructive pulmonary disease with (acute) exacerbation: Secondary | ICD-10-CM

## 2021-10-17 DIAGNOSIS — M722 Plantar fascial fibromatosis: Secondary | ICD-10-CM

## 2021-10-17 MED ORDER — LEVOFLOXACIN 500 MG PO TABS: 500.0000 mg | ORAL_TABLET | Freq: Every day | ORAL | 0 refills | Status: DC

## 2021-10-17 MED ORDER — PREDNISONE 20 MG PO TABS: ORAL_TABLET | ORAL | 0 refills | Status: DC

## 2021-10-17 NOTE — Progress Notes (Signed)
? ?Subjective:  ? ? Patient ID: Lawrence Wells, male    DOB: [birth year], 66 y.o.   MRN: [id number] ? ?Lawrence Wells is a 66 y.o. male presenting on 10/17/2021 for Cough, Headache, and Nasal Congestion ? ? ?HPI ? ?Sinusitis, acute  Respiratory illness ?Reports 1 week with acute illness symptoms of productive thicker cough and some episodic chills shaking at times. He has done COVID test at home, negative. Previous respiratory illness 07/2021. He follows w/ pulm for ILD ?No sick contact ?He has known Pulm Fibrosis / Emphysema COPD, followed by Pulm. ?History of COPD flares / pneumonia ?Concern productive cough dyspnea ? ?Plantar Fasciitis, R foot ?First step out of bed pain then improves, has not treated it before. Recent flare onset. ? ? ?Depression screen [facility] 2/9 10/17/2021 02/27/2021 12/08/2019  ?Decreased Interest 2 0 0  ?Down, Depressed, Hopeless 2 0 1  ?PHQ - 2 Score 4 0 1  ?Altered sleeping 1 - 1  ?Tired, decreased energy 1 - 1  ?Change in appetite 1 - 0  ?Feeling bad or failure about yourself  2 - 0  ?Trouble concentrating 1 - 0  ?Moving slowly or fidgety/restless 0 - 1  ?Suicidal thoughts 1 - 0  ?PHQ-9 Score 11 - 4  ?Difficult doing work/chores Somewhat difficult - Not difficult at all  ? ? ?Social History  ? ?Tobacco Use  ? Smoking status: Former  ?  Packs/day: 1.50  ?  Years: 30.00  ?  Pack years: 45.00  ?  Types: Cigarettes  ?  Quit date: 2007  ?  Years since quitting: 16.1  ? Smokeless tobacco: Former  ?  Types: Chew  ?  Quit date: 2007  ? Tobacco comments:  ?  Dip smokeless tobacco >20-30 years  ?Vaping Use  ? Vaping Use: Never used  ?Substance Use Topics  ? Alcohol use: Not Currently  ?  Comment: occ  ? Drug use: No  ? ? ?Review of Systems ?Per HPI unless specifically indicated above ? ?   ?Objective:  ?  ?BP (!) 96/59   Pulse 72   Ht 5\' 11"  (1.803 m)   Wt 128 lb 12.8 oz (58.4 kg)   SpO2 95%   BMI 17.96 kg/m?   ?Wt Readings from Last 3 Encounters:  ?10/17/21 128 lb 12.8 oz (58.4 kg)  ?09/24/21 132 lb 6  oz (60 kg)  ?08/27/21 129 lb 12.8 oz (58.9 kg)  ?  ?Physical Exam ?Vitals and nursing note reviewed.  ?Constitutional:   ?   General: He is not in acute distress. ?   Appearance: Normal appearance. He is well-developed. He is not diaphoretic.  ?   Comments: Well-appearing, comfortable, cooperative  ?HENT:  ?   Head: Normocephalic and atraumatic.  ?Eyes:  ?   General:     ?   Right eye: No discharge.     ?   Left eye: No discharge.  ?   Conjunctiva/sclera: Conjunctivae normal.  ?Neck:  ?   Thyroid: No thyromegaly.  ?Cardiovascular:  ?   Rate and Rhythm: Normal rate and regular rhythm.  ?   Pulses: Normal pulses.  ?   Heart sounds: Normal heart sounds. No murmur heard. ?Pulmonary:  ?   Effort: Pulmonary effort is normal. No respiratory distress.  ?   Breath sounds: Wheezing present. No rales.  ?   Comments: cough ?Musculoskeletal:     ?   General: Normal range of motion.  ?   Cervical back: Normal  range of motion and neck supple.  ?Lymphadenopathy:  ?   Cervical: No cervical adenopathy.  ?Skin: ?   General: Skin is warm and dry.  ?   Findings: No erythema or rash.  ?Neurological:  ?   Mental Status: He is alert and oriented to person, place, and time. Mental status is at baseline.  ?Psychiatric:     ?   Mood and Affect: Mood normal.     ?   Behavior: Behavior normal.     ?   Thought Content: Thought content normal.  ?   Comments: Well groomed, good eye contact, normal speech and thoughts  ? ? ?I have personally reviewed the radiology report from 08/27/21. ? ?DG Chest 2 View [[id number]] Resulted: 08/27/21 1455  ?Order Status: Completed Updated: 08/27/21 1457  ?Narrative:    ?CLINICAL DATA:  66 year old male with pneumonia  ? ?EXAM:  ?CHEST - 2 VIEW  ? ?COMPARISON:  07/30/2021  ? ?FINDINGS:  ?Cardiomediastinal silhouette unchanged, partially obscured by  ?overlying lung and pleural disease.  ? ?Predominately pattern reticular/microcystic changes of the bilateral  ?lungs, more pronounced at the base on the left. Bilateral   ?architectural distortion.  ? ?Stigmata of emphysema, with increased retrosternal airspace,  ?flattened hemidiaphragms, increased AP diameter, and hyperinflation  ?on the AP view.  ? ?No pneumothorax. No pleural effusion. No new confluent airspace  ?disease.  ? ?IMPRESSION:  ?Advanced fibrotic changes, with no definite evidence of superimposed  ?acute cardiopulmonary disease  ? ? ?Electronically Signed  ?  By: Corrie Mckusick D.O.  ?  On: 08/27/2021 14:55   ? ? ?Results for orders placed or performed in visit on 09/26/21  ?Lipid panel  ?Result Value Ref Range  ? Cholesterol 204 (H) <200 mg/dL  ? HDL 83 > OR = 40 mg/dL  ? Triglycerides 85 <150 mg/dL  ? LDL Cholesterol (Calc) 103 (H) mg/dL (calc)  ? Total CHOL/HDL Ratio 2.5 <5.0 (calc)  ? Non-HDL Cholesterol (Calc) 121 <130 mg/dL (calc)  ? ?   ?Assessment & Plan:  ? ?Problem List Items Addressed This Visit   ? ? COPD with acute exacerbation ([place]) - Primary  ? Relevant Medications  ? levofloxacin (LEVAQUIN) 500 MG tablet  ? predniSONE (DELTASONE) 20 MG tablet  ? ?Other Visit Diagnoses   ? ? Plantar fasciitis of right foot      ? Relevant Medications  ? predniSONE (DELTASONE) 20 MG tablet  ? ?  ?  ?COPD Exacerbation ?Interstitial lung disease, pulm fibrosis ?Followed by Pulm ?Start Levaquin x 7 day and Prednisone taper x 7 day ?Keep currently therapy otherwise ?Consider repeat CXR or return to pulm if not improving. ? ?Plantar Fasciitis R Foot ? ?Clinically consistent with acute R plantar fasciitis based on history and exam, localized pain ?- No known injury. Unlikely fracture based on history, no bony tenderness. ?- Limited conservative therapy  ? ?Plan: ?1. Reviewed diagnosis and management of plantar fasciitis ?2. On steroid for COPD ?- Tension night splint trial ?3. May take Tylenol PRN breakthrough ?4. May use topical muscle rub, ice ?5. Emphasized importance of relative rest, ice, and avoid prolonged standing and overuse ?6. Handout given and explained appropriate  stretching and home exercises important to do first thing in morning and later ?7. Follow-up 4-6 weeks if not improved, consider referral to Podiatry for further eval x-ray and injection if indicated. ? ? ?Meds ordered this encounter  ?Medications  ? levofloxacin (LEVAQUIN) 500 MG tablet  ?  Sig: Take  1 tablet (500 mg total) by mouth daily. For 7 days  ?  Dispense:  7 tablet  ?  Refill:  0  ? predniSONE (DELTASONE) 20 MG tablet  ?  Sig: Take daily with food. Start with 60mg  (3 pills) x 2 days, then reduce to 40mg  (2 pills) x 2 days, then 20mg  (1 pill) x 3 days  ?  Dispense:  13 tablet  ?  Refill:  0  ? ? ? ? ?Follow up plan: ?Return if symptoms worsen or fail to improve. ? ?Nobie Putnam, DO ?[facility] ?[facility] ?10/17/2021, 11:25 AM ?

## 2021-10-17 NOTE — Patient Instructions (Addendum)
Thank you for coming to the office today. ? ?Treat the COPD flare ?With Levaquin and Prednisone flare. ? ?You most likely have Plantar Fasciitis of heel / foot. ?- This is inflammation of the fibrous connection on the bottom of the foot, and can have small micro tears over time that become painful. It usually will have flare ups lasting days to weeks, and may come back after it heals if it is re-aggravated again. ?- Often there is a bone spur or arthritis of the heel bone that causes this ?- Also it may be caused by abnormal footwear, walking pattern or other problems ? ?If you are experiencing an acute flare with pain, this is usually worst first thing in the morning when the plantar fascia is tight and stiff. First step out of bed is painful usually, and it may gradually improve with stretching and walking. ? ?May try TENSION NIGHT SPLINT ? ?Can try topical Voltaren first - START anti inflammatory topical - OTC Voltaren (generic Diclofenac) topical 2-4 times a day as needed for pain swelling of affected joint for 1-2 weeks or longer. ? ?Or if prefer - Recommend trial of Anti-inflammatory with Naproxen (Naprosyn) 500mg  tabs - take one with food and plenty of water TWICE daily every day (breakfast and dinner), for next 2 to 4 weeks, then you may take only as needed ?- DO NOT TAKE any ibuprofen, aleve, motrin while you are taking this medicine ?- It is safe to take Tylenol Ext Str 500mg  tabs - take 1 to 2 (max dose 1000mg ) every 6 hours as needed for breakthrough pain, max 24 hour daily dose is 6 to 8 tablets or 4000mg  ? ?Recommend: ?- Rest / relative rest with activity modification avoid overuse / prolonged stand ?- Ice packs (make sure you use a towel or sock / something to protect skin) ? ?May also try topical muscle rub, icy hot, tiger balm ? ?Start the exercises listed below, gradually increase them as instructed. May be sore at first and hopefully will stretch out and help reduce pain later. ? ?If not improving  after 1-2 weeks on medicine, and stretching exercises and some rest - then can contact our office again for re-evaluation may consider other causes or can refer you to Orthopedic specialist to have second opinion, and consider a steroid injection. ? ? ?Please schedule a Follow-up Appointment to: Return if symptoms worsen or fail to improve. ? ?If you have any other questions or concerns, please feel free to call the office or send a message through [place]. You may also schedule an earlier appointment if necessary. ? ?Additionally, you may be receiving a survey about your experience at our office within a few days to 1 week by e-mail or mail. We value your feedback. ? ?Nobie Putnam, DO ?[place] ?

## 2021-10-24 ENCOUNTER — Telehealth: Payer: Self-pay | Admitting: Internal Medicine

## 2021-10-24 DIAGNOSIS — J849 Interstitial pulmonary disease, unspecified: Secondary | ICD-10-CM

## 2021-10-24 NOTE — Telephone Encounter (Signed)
Patient had HRCT ordered on 12/13/2020 and has still not been scheduled.  ?

## 2021-10-25 NOTE — Telephone Encounter (Signed)
Last seen by Patricia Nettle in January 2023.  She wanted him to see me in 3-4 months ? ?Plan ?- Sometime in April or May 2023 please do spirometry and DLCO and also high-resolution CT chest and arrange for 30-minute face-to-face visit in [place] ? ? ?He can get the CT scan and PFTs done at [facility] if he wants ? ?No flowsheet data found. ? ?

## 2021-10-31 NOTE — Telephone Encounter (Signed)
Called and spoke with pt letting him know info per MR and he verbalized understanding. Pt is scheduled to have PFT 5/26 at 10:30 and scheduled for f/u with MR at 11:00. Order also placed for the CT with comment for this to be done prior to pt's OV. Nothing further needed. ?

## 2021-12-10 DIAGNOSIS — M722 Plantar fascial fibromatosis: Secondary | ICD-10-CM | POA: Diagnosis not present

## 2021-12-10 DIAGNOSIS — M0579 Rheumatoid arthritis with rheumatoid factor of multiple sites without organ or systems involvement: Secondary | ICD-10-CM | POA: Diagnosis not present

## 2021-12-10 DIAGNOSIS — Z7960 Long term (current) use of unspecified immunomodulators and immunosuppressants: Secondary | ICD-10-CM | POA: Diagnosis not present

## 2021-12-10 DIAGNOSIS — J849 Interstitial pulmonary disease, unspecified: Secondary | ICD-10-CM | POA: Diagnosis not present

## 2021-12-26 ENCOUNTER — Ambulatory Visit: Payer: Medicare PPO | Admitting: Family Medicine

## 2021-12-26 ENCOUNTER — Encounter: Payer: Self-pay | Admitting: Family Medicine

## 2021-12-26 VITALS — BP 126/80 | HR 63 | Ht 71.0 in | Wt 129.8 lb

## 2021-12-26 DIAGNOSIS — M7022 Olecranon bursitis, left elbow: Secondary | ICD-10-CM | POA: Diagnosis not present

## 2021-12-26 NOTE — Patient Instructions (Addendum)
Thank you for coming to the office today. ? ?C[place]an stretchy compression wrap or ACE - pressure and can even do ice pack while resting if you like ? ?Avoid friction on the elbow. Avoid resting leaning on it directly or repetitive elbow flexing activities ? ?Can use topical anti inflammatory Voltaren ? ?START anti inflammatory topical - OTC Voltaren (generic Diclofenac) topical 2-4 times a day as needed for pain swelling of affected joint for 1-2 weeks or longer. ? ?May take 4-6 weeks for fully resolve ? ?Watch for skin infection, if red worsening swelling pain fever - may need antibiotics ? ?Ortho can drain if need ? ?Elbow Bursitis ? ?Elbow (olecranon) bursitis is the swelling of the fluid-filled sac (bursa) between the tip of your elbow and your skin. A bursa acts as a cushion and protects the joint. If the bursa becomes irritated, it can fill with extra fluid and become inflamed. This condition is also called olecranon bursitis. ?What are the causes? ?This condition may be caused by: ?An elbow injury, such as falling onto the elbow. ?Leaning on hard surfaces for long periods of time. ?Infection from an injury that breaks the skin near the elbow. ?A bone growth (spur) that forms at the tip of the elbow. ?A medical condition that causes inflammation, such as gout or rheumatoid arthritis. ?Sometimes the cause is not known. ?What are the signs or symptoms? ?The first sign of elbow bursitis is usually swelling at the tip of the elbow. This can grow to be about the size of a golf ball. Swelling may start suddenly or develop gradually. Other symptoms may include: ?Pain when bending or leaning on the elbow. ?Stiffness, or not being able to move the elbow normally. ?If bursitis is caused by an infection, you may have: ?Redness, warmth, and tenderness of the elbow. ?Drainage of pus from the swollen area over the elbow, if the skin breaks open. ?How is this diagnosed? ?This condition may be diagnosed based on: ?Your symptoms  and medical history. ?Any recent injuries you have had. ?A physical exam. ?X-rays to check for a bone spur or fracture. ?Draining fluid from the bursa to test it for infection. ?Blood tests to rule out gout or rheumatoid arthritis. ?How is this treated? ?Treatment for this condition depends on the cause. Treatment may include: ?Medicines. These may include: ?Over-the-counter medicines to relieve pain and inflammation. ?Antibiotic medicines if there is an infection. ?Injections of anti-inflammatory medicines (steroids). ?Draining fluid from the bursa. ?Wrapping your elbow with a bandage or compression arm sleeve. ?Wearing elbow pads. ?If these treatments do not help, you may need surgery to remove the bursa. ?Follow these instructions at home: ?Medicines ?Take over-the-counter and prescription medicines only as told by your health care provider. ?If you were prescribed an antibiotic medicine, take it as told by your health care provider. Do not stop taking the antibiotic even if you start to feel better. ?Managing pain, stiffness, and swelling ? ?  ? ?If directed, put ice on the affected area. To do this: ?Put ice in a plastic bag. ?Place a towel between your skin and the bag. ?Leave the ice on for 20 minutes, 2-3 times a day. ?Remove the ice if your skin turns bright red. This is very important. If you cannot feel pain, heat, or cold, you have a greater risk of damage to the area. ?If directed, apply heat to the affected area as often as told by your health care provider. Use the heat source that your health  care provider recommends, such as a moist heat pack or a heating pad. ?Place a towel between your skin and the heat source. ?Leave the heat on 20-30 minutes. ?Remove the heat if your skin turns bright red. This is especially important if you are unable to feel pain, heat, or cold. You may have a greater risk of getting burned. ?If your bursitis is caused by an injury, rest your elbow and wear your bandage or  sleeve as told by your health care provider. ?Use elbow pads or elbow wraps to cushion your elbow and control swelling as needed. ?General instructions ?Avoid any activities that cause elbow pain. Ask your health care provider what activities are safe for you. ?Keep all follow-up visits. This is important. ?Contact a health care provider if: ?You have a fever. ?Your symptoms do not get better with treatment. ?You have pain or swelling that gets worse, or it goes away and then comes back. ?You have pus draining from your elbow. ?You have redness around the elbow area. ?Your elbow is warm to the touch. ?Get help right away if: ?You have trouble moving your arm, hand, or fingers. ?Summary ?Elbow (olecranon) bursitis is the swelling of the fluid-filled sac (bursa) between the tip of your elbow and your skin. ?Treatment for elbow bursitis depends on the cause. It may include medicines to relieve pain and inflammation, antibiotic medicines to treat an infection, or draining fluid from your elbow. ?Contact a health care provider if your symptoms do not get better with treatment, or if your symptoms go away and then come back. ?This information is not intended to replace advice given to you by your health care provider. Make sure you discuss any questions you have with your health care provider. ?Document Revised: 07/31/2021 Document Reviewed: 07/31/2021 ?Elsevier Patient Education ? [place]. ? ? ?Please schedule a Follow-up Appointment to: Return if symptoms worsen or fail to improve. ? ?If you have any other questions or concerns, please feel free to call the office or send a message through [place]. You may also schedule an earlier appointment if necessary. ? ?Additionally, you may be receiving a survey about your experience at our office within a few days to 1 week by e-mail or mail. We value your feedback. ? ?Nobie Putnam, DO ?[place] ?

## 2021-12-26 NOTE — Progress Notes (Signed)
? ?Subjective:  ? ? Patient ID: Lawrence Wells, male    DOB: [birth year], 66 y.o.   MRN: [id number] ? ?Lawrence Wells is a 66 y.o. male presenting on 12/26/2021 for Joint Swelling (elbow) ? ? ?HPI ? ?L Elbow Bursitis / Swelling ?Reports new problem recently with swelling fluid around L elbow. No acute injury or trauma. Not red or warm. No fever or chills. No prior similar issue. Able to bend and flex. Non tender. ? ? ?  12/26/2021  ?  4:07 PM 10/17/2021  ? 11:10 AM 02/27/2021  ?  9:43 AM  ?Depression screen PHQ 2/9  ?Decreased Interest 0 2 0  ?Down, Depressed, Hopeless 0 2 0  ?PHQ - 2 Score 0 4 0  ?Altered sleeping 2 1   ?Tired, decreased energy 1 1   ?Change in appetite 0 1   ?Feeling bad or failure about yourself  1 2   ?Trouble concentrating 0 1   ?Moving slowly or fidgety/restless 1 0   ?Suicidal thoughts 1 1   ?PHQ-9 Score 6 11   ?Difficult doing work/chores Somewhat difficult Somewhat difficult   ? ? ?Social History  ? ?Tobacco Use  ? Smoking status: Former  ?  Packs/day: 1.50  ?  Years: 30.00  ?  Pack years: 45.00  ?  Types: Cigarettes  ?  Quit date: 2007  ?  Years since quitting: 16.3  ? Smokeless tobacco: Former  ?  Types: Chew  ?  Quit date: 2007  ? Tobacco comments:  ?  Dip smokeless tobacco >20-30 years  ?Vaping Use  ? Vaping Use: Never used  ?Substance Use Topics  ? Alcohol use: Not Currently  ?  Comment: occ  ? Drug use: No  ? ? ?Review of Systems ?Per HPI unless specifically indicated above ? ?   ?Objective:  ?  ?BP 126/80   Pulse 63   Ht '5\' 11"'$  (1.803 m)   Wt 129 lb 12.8 oz (58.9 kg)   SpO2 99%   BMI 18.10 kg/m?   ?Wt Readings from Last 3 Encounters:  ?12/26/21 129 lb 12.8 oz (58.9 kg)  ?10/17/21 128 lb 12.8 oz (58.4 kg)  ?09/24/21 132 lb 6 oz (60 kg)  ?  ?Physical Exam ?Vitals and nursing note reviewed.  ?Constitutional:   ?   General: He is not in acute distress. ?   Appearance: Normal appearance. He is well-developed. He is not diaphoretic.  ?   Comments: Well-appearing, comfortable, cooperative   ?HENT:  ?   Head: Normocephalic and atraumatic.  ?Eyes:  ?   General:     ?   Right eye: No discharge.     ?   Left eye: No discharge.  ?   Conjunctiva/sclera: Conjunctivae normal.  ?Cardiovascular:  ?   Rate and Rhythm: Normal rate.  ?Pulmonary:  ?   Effort: Pulmonary effort is normal.  ?Musculoskeletal:  ?   Comments: Left elbow olecranon bursa fluid increased soft fluctuant mobile, can flex extend, no erythema, non tender. Palpable mild olecranon bone spur  ?Skin: ?   General: Skin is warm and dry.  ?   Findings: No erythema or rash.  ?Neurological:  ?   Mental Status: He is alert and oriented to person, place, and time.  ?Psychiatric:     ?   Mood and Affect: Mood normal.     ?   Behavior: Behavior normal.     ?   Thought Content: Thought content normal.  ?  Comments: Well groomed, good eye contact, normal speech and thoughts  ? ?Results for orders placed or performed in visit on 09/26/21  ?Lipid panel  ?Result Value Ref Range  ? Cholesterol 204 (H) <200 mg/dL  ? HDL 83 > OR = 40 mg/dL  ? Triglycerides 85 <150 mg/dL  ? LDL Cholesterol (Calc) 103 (H) mg/dL (calc)  ? Total CHOL/HDL Ratio 2.5 <5.0 (calc)  ? Non-HDL Cholesterol (Calc) 121 <130 mg/dL (calc)  ? ?   ?Assessment & Plan:  ? ?Problem List Items Addressed This Visit   ?None ?Visit Diagnoses   ? ? Olecranon bursitis of left elbow    -  Primary  ? ?  ?  ?Consistent with acute Left new onset olecranon bursitis, without evidence of secondary infection or cellulitis. Mild at this time. Full range of motion at this time. Not restricted ? ?- Suspected from repetitive tasks / leaning, feels like small bone spur of olecranon and some loose body can contribute ? ?- Range of motion of elbow is normal, and not restricted ?- No injury or trauma ? ?Plan ?1. Reassurance, likely benign and will improve ?2. Defer aspiration today without evidence of complication, goal to avoid infection due to disrupting skin barrier ?3. Defer antibiotics no sign of infection ?4. Start  topical Diclofenac gel TID PRN for reducing inflammation topically ?5. RICE therapy ?6. Avoid prolong leaning pressure on elbow ?7. Follow-up within few weeks if not improved, re-consider aspiration if persistent, may send fluid for infection vs crystals, also may refer to Ortho if needed ?- Return criteria given if worsening cellulitis or joint infection, when to go to hospital sooner ? ? ?No orders of the defined types were placed in this encounter. ? ? ? ? ?Follow up plan: ?Return if symptoms worsen or fail to improve. ? ? ? ?Nobie Putnam, DO ?[facility] ?[facility] ?12/26/2021, 4:39 PM ?

## 2022-01-07 ENCOUNTER — Ambulatory Visit: Payer: Medicare PPO

## 2022-01-08 DIAGNOSIS — H10012 Acute follicular conjunctivitis, left eye: Secondary | ICD-10-CM | POA: Diagnosis not present

## 2022-01-11 ENCOUNTER — Ambulatory Visit: Payer: Medicare PPO | Admitting: Internal Medicine

## 2022-01-15 ENCOUNTER — Ambulatory Visit: Payer: Medicare PPO

## 2022-01-15 ENCOUNTER — Telehealth: Payer: Self-pay | Admitting: Internal Medicine

## 2022-01-15 NOTE — Telephone Encounter (Signed)
I called and spoke with the pt He was supposed to have HRCT done today and he says it was cancelled at the last minute  He said they told him they cancelled bc they need more information from our office  Please advise, thanks!  Notes under why appt was cancelled:  No Insurance Pre-Authorization Josem Kaufmann is for wrong facility)  Sending to [facility]

## 2022-01-17 ENCOUNTER — Ambulatory Visit (INDEPENDENT_AMBULATORY_CARE_PROVIDER_SITE_OTHER): Payer: Medicare PPO | Admitting: Internal Medicine

## 2022-01-17 ENCOUNTER — Ambulatory Visit: Payer: Medicare PPO | Admitting: Internal Medicine

## 2022-01-17 ENCOUNTER — Encounter: Payer: Self-pay | Admitting: Internal Medicine

## 2022-01-17 VITALS — BP 116/62 | HR 56 | Ht 71.0 in | Wt 131.0 lb

## 2022-01-17 DIAGNOSIS — J8489 Other specified interstitial pulmonary diseases: Secondary | ICD-10-CM | POA: Diagnosis not present

## 2022-01-17 DIAGNOSIS — M359 Systemic involvement of connective tissue, unspecified: Secondary | ICD-10-CM

## 2022-01-17 DIAGNOSIS — I2510 Atherosclerotic heart disease of native coronary artery without angina pectoris: Secondary | ICD-10-CM

## 2022-01-17 DIAGNOSIS — J849 Interstitial pulmonary disease, unspecified: Secondary | ICD-10-CM | POA: Diagnosis not present

## 2022-01-17 DIAGNOSIS — I2584 Coronary atherosclerosis due to calcified coronary lesion: Secondary | ICD-10-CM | POA: Diagnosis not present

## 2022-01-17 DIAGNOSIS — J439 Emphysema, unspecified: Secondary | ICD-10-CM | POA: Diagnosis not present

## 2022-01-17 DIAGNOSIS — M0579 Rheumatoid arthritis with rheumatoid factor of multiple sites without organ or systems involvement: Secondary | ICD-10-CM | POA: Diagnosis not present

## 2022-01-17 LAB — PULMONARY FUNCTION TEST
DL/VA % pred: 41 %
DL/VA: 1.7 ml/min/mmHg/L
DLCO cor % pred: 33 %
DLCO cor: 9.24 ml/min/mmHg
DLCO unc % pred: 33 %
DLCO unc: 9.05 ml/min/mmHg
FEF 25-75 Pre: 2.7 L/sec
FEF2575-%Pred-Pre: 99 %
FEV1-%Pred-Pre: 95 %
FEV1-Pre: 3.33 L
FEV1FVC-%Pred-Pre: 110 %
FEV6-%Pred-Pre: 91 %
FEV6-Pre: 4.07 L
FEV6FVC-%Pred-Pre: 105 %
FVC-%Pred-Pre: 86 %
FVC-Pre: 4.08 L
Pre FEV1/FVC ratio: 82 %
Pre FEV6/FVC Ratio: 100 %

## 2022-01-17 NOTE — Telephone Encounter (Signed)
I have just seen this.  Working on getting the location changed.

## 2022-01-17 NOTE — Progress Notes (Signed)
OV 09/10/2019  Subjective:  Patient ID: Lawrence Wells, male , DOB: March 02, 1956 , age 66 y.o. , MRN: [id number] , ADDRESS: [place] [address]   09/10/2019 -   Chief Complaint  Patient presents with   pulmonary consult    per Dr. Parks Ranger- CXR 09/08/2019. pt reports of sob with exertion, talking and bending, prod cough with yellow mucus, left sided chest discomfort  and wheezing.     HPI THAISON KOLODZIEJSKI 66 y.o. -referred for interstitial lung disease after a chest x-ray from September 08, 2019 that I personally visualized and interpreted shows diffuse ILD along with left upper lobe nodule.  Patient has wife tell me that he has had insidious onset of shortness of breath for a year with progression in the last few months.  They also tell me that few years ago he had an chest x-ray done for an incidental unrelated reason that showed chronic changes of scarring but apparently were reassured.  In review of his chart and noticed that in 2005 this is chest x-ray done in the [facility] system with reports of ILD in it.  He is unaware of that.  History for this visit is given by the patient and his wife and review of the chart. [place] Integrated Comprehensive ILD Questionnaire  Symptoms:   Insidious onset of shortness of breath for the last year getting worse in the last 3 to 4 months.  Definitely no dyspnea a few years ago even though chest x-ray from 2005 was reported as ILD.  Sometimes dyspnea is episodic but mostly with exertion relieved by rest.  Symptom severity is listed below.  There is associated significant cough.  Cough started in October 2020.  It is getting worse cough is rated as moderate to severe.  He coughs at night.  He does bring up some yellow phlegm.  It is worse when he lies down.Marland Kitchen  He does clear his throat and he does feel a tickle in the back of the throat.  There is also associated wheezing.       Past Medical History : He gives a history positive for  arthritis.  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  Denies any asthma or known COPD.  Denies heart failure.  Denies scleroderma any collagen vascular disease.  Denies diagnosis of acid reflux or hiatal hernia.  Denies obstructive sleep apnea.  Denies pulmonary hypertension.  Denies diabetes or thyroid disease or stroke or seizures.  Denies infectious mononucleosis.  Denies hepatitis.  Denies tuberculosis denies kidney disease.  Denies blood clots denies heart disease denies pleurisy.   ROS: Positive for fatigue and arthralgia.  Positive for acid reflux.  He went to check the box for [name].  Denies any GI symptoms or rash or ulcers  FAMILY HISTORY of LUNG DISEASE: Denies including COPD and pulmonary fibrosis   EXPOSURE HISTORY: Smokes cigarettes between 1983 and 2007 1 to 2 packs/day and then quit.  Did not smoke cigars did not smoke pipe.  Did not vape.  Never smoked marijuana or cocaine.  Never used intravenous drugs.   HOME and HOBBY DETAILS : [place].  The age of the home is 20 years.  There is no dampness in the living environment.  No mold or mildew.  Does not use humidifier.  No CPAP use.  Does not use nebulizer.  No steam iron use.  No Jacuzzi.  No pet birds or parakeets in the house.  No pet gerbils.  No feather pillows.  No mold in the [facility] duct.  Does not play wind instruments.  Does not do any gardening.   OCCUPATIONAL HISTORY (122 questions) : Works in the Transport planner for 30 years.  Worked in [employer] and crawl spaces.  A lot of dust exposure.  During this time he worked in damp air-conditioned spaces.  He thinks he had asbestos exposure.  He also worked in the [employer].  He cleaned AC coils.  Otherwise history is negative   PULMONARY TOXICITY HISTORY (27 items): Entirely  negative.       ROS - per HPI    OV 10/22/2019  Subjective:  Patient ID: Lawrence Wells, male , DOB: [birth year] , age 58 y.o. , MRN: [id number] , ADDRESS: [place] [address]   10/22/2019 -   Chief Complaint  Patient presents with   Follow-up    ILD Followup. SOB on exertion. Non prod cough. Pt denies any wheezing, fever, chills, or sweats.   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  Started on nintedanib 10/12/2019  Associated emphysema present on CT scan January 2021  Normal cardiac stress test 10/14/2019   HPI [name] 66 y.o. -presents for follow-up with his wife.  At last saw him end of January 2021 for ILD evaluation.  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  He then saw a rheumatologist Dr. D yesterday 10/21/2019 and given a diagnosis of joint rheumatoid arthritis.  In the interim he also saw pulmonary nurse practitioner for a virtual visit and was started on nintedanib but she started [place] on 10/12/2019.  So far is tolerating it fine.  He is using oxygen only with exertion.  There is no deterioration in symptoms.  Yesterday the rheumatology visit Imuran has been recommended first rheumatoid arthritis.  The TPMT test has been done and the results are pending.  He does not have liver function tests checked after he started his nintedanib.  He and his wife have many questions about the future course of the disease and natural history and other management strategies.  Currently is free was not helping him but he is willing to take it because of the associated emphysema.   I personally visualized and reviewed the  CT and also interpreted the findings myself   OV 12/02/2019  Subjective:  Patient ID: Lawrence Wells, male , DOB: Sep 04, 1955 , age 74 y.o. , MRN: [id number] , ADDRESS: [place] [address]   12/02/2019 -   Chief Complaint  Patient presents with   Follow-up    Follow-up rheumatoid arthritis-ILD/UIP pattern (based on high titer rheumatoid factor 09/10/2019, UIP on CT scan 09/14/2019 and rheumatology visit confirming diagnosis of rheumatoid arthritis on 10/21/2019].  Started on nintedanib 10/12/2019  Associated emphysema present on CT scan January 2021  Normal cardiac stress test 10/14/2019  Rheumatoid arthritis on Imuran  HPI Laakea Pereira Irizarry 66 y.o. -4 returns for follow-up with his wife to the [place]  cl[facility].  He is a Engineer, manufacturing systems.  He normally seen in the [place] clinic.  At this point in time he tells me that he is tolerating the nintedanib fine.  He needs a liver function test.  However he has lost weight 160 pounds.  He does not know the reason.  He is tolerating nintedanib fine.  He is on Imuran for his rheumatoid arthritis.  In terms of his effort tolerance it is stable.  He is undergoing pulmonary rehabilitation and is using 3-4 L of oxygen with exertion.  He is still on the waiting list for a portable system.  He is willing to be seen by the transplant team for pulmonary fibrosis..    In terms of his rheumatoid arthritis he is on Imuran he is on a prednisone taper but still having pain.  He had her steroid injections.  He is not on Plaquenil.  I have encouraged him to talk to his rheumatologist about it.  Overall stable  Overall symptom score listed below.   OV 01/06/2020  Subjective:  Patient ID: Lawrence Wells, male , DOB: 1955-10-03 , age 40 y.o. , MRN: [id number] , ADDRESS: [address] [place] [address]   Follow-up rheumatoid arthritis-ILD/UIP pattern (based on high titer rheumatoid factor 09/10/2019, UIP on CT scan 09/14/2019 and  rheumatology visit confirming diagnosis of rheumatoid arthritis on 10/21/2019].  Started on nintedanib 10/12/2019  Associated emphysema present on CT scan January 2021  Normal cardiac stress test 10/14/2019  Rheumatoid arthritis on Imuran   01/06/2020 -     HPI Korver E Furia 66 y.o. -there is a telephone visit.  Patient identified with 2 person identifier.  Wife also came on the phone.  He tells me in the wife also attest that since starting nintedanib he has continued to lose weight.  Today the weight is 159 pounds.  Diarrhea has started despite taking Imodium once a day.  The diarrhea is severe as documented by a level 4.  He is undergoing pulmonary rehabilitation and this is helped dyspnea but he is dealing with significant pain from the rheumatoid arthritis.  He is undergoing transplant evaluation at [facility] and is quite busy with that.  He has upcoming appointment with me in mid June 2021 for a face-to-face for pulmonary function test and follow-up.  But at this point in time symptoms of weight loss and diarrhea are paramount.     OV 03/07/2020   Subjective:  Patient ID: Lawrence Wells, male , DOB: Jan 24, 1956, age 30 y.o. years. , MRN: [id number],  ADDRESS: [place] [address] PCP  Olin Hauser, DO Providers : Treatment Team:  Attending Provider: Brand Males, MD   Chief Complaint  Patient presents with   Follow-up    cough in morning     Follow-up rheumatoid arthritis-ILD/UIP pattern (based on high titer rheumatoid factor 09/10/2019, UIP on CT scan 09/14/2019 and rheumatology visit confirming diagnosis of rheumatoid arthritis on 10/21/2019].  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 -presents for follow-up with his wife.   After his last visit he had COVID-19.  He was hospitalized.  I communicated with the hospitalist.  Since then he is recovered but is now left with a worse than baseline.  He is now requiring 4 L nasal cannula with exertion even though this is an improvement is worse than his April 2021 baseline.  In terms of his rheumatoid arthritis he is on Imuran and prednisone.  This controls the pain.  However post Covid he developed some throat swelling.  Initially thought was sinus infection.  He saw primary care this then resulted in ENT evaluation.  Mid July 2020 when he has been diagnosed with sudden onset of right tonsillar cancer.  He has [facility] [employer] appointment pending.  Because of all this he is no longer a candidate for [facility] lung transplant services.  He is doing pulmonary rehabilitation.  Also because of all this he is lost significant amount of weight as documented below.  Is lost 13 pounds since April 2021.  He is asking for handicap placard.  Symptom scores are listed below.  Overall high symptom burden.  He and his wife are very frustrated by the turn of events.     OV 08/04/2020   Subjective:  Patient ID: Lawrence Wells, male , DOB: 16-May-1956, age 20 y.o. years. , MRN: [id number],  ADDRESS: [place] [address] PCP  Olin Hauser, DO Providers : Treatment Team:  Attending Provider: Brand Males, MD Patient Care Team: Olin Hauser, DO as PCP - General (Family Medicine)    Chief Complaint  Patient presents with   Follow-up    C/o sob with exertion.      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 -returns for follow-up of his pulmonary fibrosis.  He has had a terrible year of 2021 when he is been diagnosed with pulmonary fibrosis and rheumatoid arthritis and then had Covid and then in the aftermath of Covid had right tonsillar cancer.  When I last saw him he had to go to [facility] to get chemo and radiation.  He is here with his wife.  He tells me he is finished chemo and radiation.  He says he lost 50 pounds but has gained a lot of the pounds back.  He has a PEG tube.  Because of the PEG tube and dysphagia he is not been able to take Eliquis or nintedanib.  Despite this he feels that his pulmonary fibrosis is stable.  He does not use his oxygen much.  He says he is lucky to have survived so far.  He continues Imuran for rheumatoid arthritis.  His joints feel stable.  His foot drop is improved.  Of note in the last 4 days he has developed a black coated tongue.  He is in touch with his [place] about this.  Review of his symptom score and desaturation test suggest that his ILD continues to be stable.  He and his wife are wondering when to restart his nintedanib.  He says he is slowly beginning to swallow pills and food.  I have asked him to wait for his PET scan in January 2022 and get it cleared by his cancer and ENT physicians.       OV 12/13/2020  Subjective:  Patient ID: Jenny Reichmann  DAMIEAN LUKES, male , DOB: [birth year] , age 57 y.o. , MRN: [id number] , ADDRESS: [place] [address] PCP Olin Hauser, DO Patient Care Team: Olin Hauser, DO as PCP - General (Family Medicine)  This Provider for this visit: Treatment Team:  Attending Provider: Brand Males, MD     12/13/2020 -   Chief Complaint  Patient presents with   Follow-up    Pt states he has been doing well since last visit and denies any complaints. States he had his oxygen taken back by DME as he was not using  it.     HPI RAMEY SCHIFF 66 y.o. -returns for follow-up.  Last seen in December 2021.  At this point in time he is on Imuran for his rheumatoid arthritis which also serves for his connective tissue disease.  Since May of last he has not been able to take nintedanib because he continues to lose weight.  In the interim he did have tonsillar cancer and had dysphagia and could not actually swallow nintedanib.  His tonsillar cancer is now in remission since January 2022.  I reviewed the PET scan results at [facility] and copied and pasted below.  His PEG tube is off since February 2022.  He is eating well.  He is quite functional however he is continue to lose weight which he believes is because he is more active.  It is unintentional.  But he thinks that the healthy weight loss.  A few months ago he was 140s pounds in weight.  Now he is in his 130s.  He is not overly concerned about it but will monitor.  He is able to walk quite a bit and feels less short of breath.  Today when we walked him he did not desaturate but he refused oxygen.  His last PFT was in January 2021 last CT chest was in summer 2021.  At this point in time he is content doing the Imuran.  He does not want oxygen.  He also [and I agree with him] does not want nintedanib.    PFT  No flowsheet data found.  PET Scan Jan 2022  IMPRESSION:  No definite residual mass/tumor within the region of the right tonsil  extending along the mucosal surface into the right vallecula. No suspicious  lymphadenopathy is identified.   Electronically Reviewed by:  Renae Gloss, MD, [facility]  Electronically Reviewed on:  08/30/2020 3:33 PM     08/27/2021 Follow up ; RA related ILD, pneumonia Patient returns for 1 month follow-up.  Patient was seen last visit with a slow to resolve acute bronchitis plus or minus pneumonia.  Patient had been seen by his primary care provider.  RSV, COVID and flu swabs were negative.  Chest x-ray showed  increasing bibasilar lung opacities.  Suspicious for possible superimposed infection. Patient does have rheumatoid arthritis and is on Plaquenil and Imuran. Patient was recommended to begin Levaquin and a prednisone taper.  Since last visit patient is feeling much better.  Cough and congestion have decreased substantially.  Patient says he is back to baseline feeling good.  Patient says he remains very active at home.  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  He is followed at [facility].  PEG tube  was removed in February 2022. Was previously on oxygen but no longer using. Last  used 10/2020. Sent back to DME>   Flu shot is utd.   Remains active at home , works in yard and garden. Hobbies . Very active  Has completed pulmonary rehab in past.  OV 01/17/2022  Subjective:  Patient ID: Lawrence Wells, male , DOB: 28-Jul-1956 , age 11 y.o. , MRN: [id number] , ADDRESS: [address] [place] [facility] [address] PCP Olin Hauser, DO Patient Care Team: Olin Hauser, DO as PCP - General (Family Medicine)  This Provider for this visit: Treatment Team:  Attending Provider: Brand Males, MD    01/17/2022 -   Chief Complaint  Patient presents with   Follow-up    PFT performed today.  Pt states he has been doing okay since last visit.    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 -returns for follow-up.  He says he is doing well except for the fact that he still continues to be lean and underweight it.  He feels his cancer is under remission.  He is eating well.  He is quite active.  He is surprised that he is now off oxygen and doing well.  He is on Imuran and Plaquenil for his rheumatoid arthritis.  Is not on any antifibrotic.  He is surprised that he is alive and in fact doing well.  He is also surprised that he is not on oxygen.  He is wondering if the chemotherapy for cancer actually help resolve his fibrosis [I could although listen to crackles and this pulm function test shows low DLCO].  He was supposed to have a high-resolution CT scan of the chest but our office sent the authorization to the wrong place.  Nevertheless he feels good.  His walking desaturation test is improved     SYMPTOM SCALE - ILD 12/02/2019 On ofev, immuran, rehab  wegt 163# 159# 01/06/2020  03/07/2020 150#, ofev, immuran, pred 7.5 and 4L [place] iwt with exertion.  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 Not on ofev  01/17/2022 131#  O2 use ra  ra ra ra ra  Shortness of Breath 0 -> 5 scale with 5 being worst (score 6 If unable to do)       At rest 0  0 0 0 0  Simple tasks - showers, clothes change, eating, shaving 3  5 0 0 0  Household (dishes, doing bed, laundry) 3  5 0 0 1  Shopping 4  5  0 0 1  Walking level at own pace 3.'5  5 1 '$ 0 0  Walking up Stairs '4 4 6 1 1 1  '$ Total (30-36) Dyspnea Score '18   26 2 1 3  '$ How bad is your cough? '3 2 5 '$ 0 0 0  How bad is your fatigue '3  4 2 1 '$ 0  How bad is nausea 0  4 0 0 0  How bad is vomiting?  0  0 0 0 0  How bad is diarrhea? 0 4 despite immodium 1 per day 2 0 0 0  How bad is anxiety? '2  2 5 '$ 0 0  How bad is depression 2  3 0 0 0  pain  2 0  0 0        Simple office walk 185 feet x  3 laps goal with forehead probe 09/10/2019  10/22/2019  .03/07/2020   08/04/2020  12/13/2020  01/17/2022   O2 used ra ra - walk, uses 2L portable at home with exerion ra ra  ra  Number laps completed '3 3 3 '$ attempted but did only 1/2 Did all 3  Did all 3 laps  Comments about pace mod     Good pace  Resting Pulse Ox/HR 98% and 74/min 95% and 71 99% and 85/min 95% and HR 91/min 99% and HR 63 100% and 56/min  Final Pulse Ox/HR 88% and 96/min 86% and 101 88% a and 97/mi 89% and 107% 86% and HR 81 96% and 72 bpm  Desaturated </= 88% yes yes      Desaturated <= 3% points Yes, 10 points Yes, 9 pon      Got Tachycardic >/= 90/min yes yes      Symptoms at end of test Yes "pretty heavy" yes      Miscellaneous comments Corrected 2L   Corrected with 4L [place]  Refused o3 Improved     PFT     Latest Ref Rng & Units 01/17/2022    3:41 PM  PFT Results  FVC-Pre L 4.08  P  FVC-Predicted Pre % 86  P  Pre FEV1/FVC % % 82  P  FEV1-Pre L 3.33  P  FEV1-Predicted Pre % 95  P  DLCO uncorrected ml/min/mmHg 9.05  P  DLCO UNC% % 33  P  DLCO corrected ml/min/mmHg 9.24  P  DLCO COR %Predicted % 33  P  DLVA Predicted % 41  P    P Preliminary result       has a past medical history of Arthritis, Collagen vascular disease ([place]), COPD (chronic obstructive pulmonary disease) ([place]), Coronary artery disease, Dyspnea, Emphysema lung ([place]), Hyperlipidemia, and 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   reports that he quit smoking about 16 years ago. His smoking use included cigarettes. He has a 45.00 pack-year smoking history. He quit smokeless tobacco use about 16 years ago.  His smokeless tobacco  use included chew.  Past Surgical History:  Procedure Laterality Date   SINUS SURGERY WITH INSTATRAK      Allergies  Allergen Reactions   Atorvastatin Other (See Comments)    Myalgias    Immunization History  Administered Date(s) Administered   Fluad Quad(high Dose 65+) 08/10/2021   Influenza, High Dose Seasonal PF 07/28/2020   PFIZER(Purple Top)SARS-COV-2 Vaccination 11/22/2019, 12/15/2019   Pneumococcal Conjugate-13 09/08/2019   Tdap 04/19/2015, 05/07/2017   Zoster Recombinat (Shingrix) 09/29/2019, 11/27/2019    Family History  Problem Relation Age of Onset   Heart disease Mother  Heart attack Mother 18   Arthritis Mother    Heart disease Father    Heart attack Father 51   Healthy Sister    Hyperlipidemia Brother    Throat cancer Brother 35   Heart disease Brother    Healthy Son    Healthy Daughter      Current Outpatient Medications:    azaTHIOprine (IMURAN) 50 MG tablet, Take 2 tablets (100 mg total) by mouth daily., Disp: 180 tablet, Rfl: 0   ezetimibe (ZETIA) 10 MG tablet, Take 1 tablet (10 mg total) by mouth daily., Disp: 90 tablet, Rfl: 3   hydroxychloroquine (PLAQUENIL) 200 MG tablet, Take 1 tablet by mouth daily., Disp: , Rfl:    Multiple Vitamins-Minerals (CENTRUM SILVER 50+MEN PO), Take by mouth daily., Disp: , Rfl:       Objective:   Vitals:   01/17/22 1636  BP: 116/62  Pulse: (!) 56  SpO2: 100%  Weight: 131 lb (59.4 kg)  Height: '5\' 11"'$  (1.803 m)    Estimated body mass index is 18.27 kg/m as calculated from the following:   Height as of this encounter: '5\' 11"'$  (1.803 m).   Weight as of this encounter: 131 lb (59.4 kg).  '@WEIGHTCHANGE'$ @  [employer]   01/17/22 1636  Weight: 131 lb (59.4 kg)     Physical Exam    General: No distress. lean Neuro: Alert and Oriented x 3. GCS 15. Speech normal Psych: Pleasant Resp:  Barrel Chest - no.  Wheeze - no, Crackles - yes, No overt respiratory distress CVS: Normal heart sounds. Murmurs -  no Ext: Stigmata of Connective Tissue Disease - no HEENT: Normal upper airway. PEERL +. No post nasal drip        Assessment:       ICD-10-CM   1. Interstitial lung disease due to connective tissue disease ([place])  J84.89 CT Chest High Resolution   M35.9 ECHOCARDIOGRAM COMPLETE    2. Rheumatoid arthritis involving multiple sites with positive rheumatoid factor (HCC)  M05.79     3. Pulmonary emphysema, unspecified emphysema type ([place])  J43.9     4. Coronary artery calcification  I25.10 ECHOCARDIOGRAM COMPLETE   I25.84          Plan:     Patient Instructions     ICD-10-CM   1. Interstitial lung disease due to connective tissue disease ([place])  J84.89    M35.9     2. Rheumatoid arthritis involving multiple sites with positive rheumatoid factor (HCC)  M05.79     3. Pulmonary emphysema, unspecified emphysema type ([place])  J43.9       Clinically stable Based on exam - fibrosis still present Glad functionally you are better  Plan  = continued observtion - do HRCT In 4 months (postpoe current HRCT)  -do ECHO in 4 months  Followup  - 24month Dr RChase Caller- onsite visit - 30 min   - symptoms score and walk test at followuo    SIGNATURE    Dr. MBrand Males M.D., F.C.C.P,  Pulmonary and Critical Care Medicine Staff Physician, CClaringtonDirector - Interstitial Lung Disease  Program  [place] [id number] Pager: [phone] If no answer or between  15:00h - 7:00h: call [phone] Telephone: [phone]  5:06 PM 01/17/2022

## 2022-01-17 NOTE — Telephone Encounter (Signed)
Location has been updated.

## 2022-01-17 NOTE — Addendum Note (Signed)
Addended by: Lorretta Harp on: 01/17/2022 05:09 PM   Modules accepted: Orders

## 2022-01-17 NOTE — Telephone Encounter (Signed)
Holly you did the Auth on this CT looks like they are stating the Josem Kaufmann was for wrong location

## 2022-01-17 NOTE — Patient Instructions (Signed)
ICD-10-CM   1. Interstitial lung disease due to connective tissue disease ([place])  J84.89    M35.9     2. Rheumatoid arthritis involving multiple sites with positive rheumatoid factor ([facility])  M05.79     3. Pulmonary emphysema, unspecified emphysema type ([place])  J43.9       Clinically stable Based on exam - fibrosis still present Glad functionally you are better  Plan  = continued observtion - do HRCT In 4 months (postpoe current HRCT)  -do ECHO in 4 months  Followup  - 64month Dr RChase Caller- onsite visit - 30 min   - symptoms score and walk test at followuo

## 2022-01-17 NOTE — Progress Notes (Signed)
Spirometry and Dlco done today. 

## 2022-01-18 ENCOUNTER — Ambulatory Visit
Admission: RE | Admit: 2022-01-18 | Discharge: 2022-01-18 | Disposition: A | Discharge status: Home / Self Care | Payer: Medicare PPO | Source: Ambulatory Visit | Attending: Internal Medicine | Admitting: Internal Medicine

## 2022-01-18 DIAGNOSIS — I700 Atherosclerosis of aorta: Secondary | ICD-10-CM | POA: Diagnosis not present

## 2022-01-18 DIAGNOSIS — J8489 Other specified interstitial pulmonary diseases: Secondary | ICD-10-CM | POA: Diagnosis not present

## 2022-01-18 DIAGNOSIS — J479 Bronchiectasis, uncomplicated: Secondary | ICD-10-CM | POA: Diagnosis not present

## 2022-01-18 DIAGNOSIS — J84112 Idiopathic pulmonary fibrosis: Secondary | ICD-10-CM | POA: Diagnosis not present

## 2022-01-18 DIAGNOSIS — M359 Systemic involvement of connective tissue, unspecified: Secondary | ICD-10-CM | POA: Insufficient documentation

## 2022-01-18 DIAGNOSIS — J432 Centrilobular emphysema: Secondary | ICD-10-CM | POA: Diagnosis not present

## 2022-01-18 NOTE — Telephone Encounter (Signed)
I have spoke with [name] and his CT has been rescheduled for today 01/18/22 @ 2:00pm at [facility] which the Josem Kaufmann has been corrected

## 2022-01-22 ENCOUNTER — Telehealth: Payer: Self-pay | Admitting: Internal Medicine

## 2022-01-22 NOTE — Telephone Encounter (Signed)
Lawrence Wells  Please tell Mr. 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 as before B) even though he feels well on the CT scan the slow progression pulmonary fibrosis in 2 years C) this typically means antifibrotic is indicated D) change follow-up appointment from 4 months to 3 months -> get repeat spirometry and DLCO E) please let him know that Leda Gauze from [facility] is likely to reach out to him about the registry study pariticipation -I have sent her his information   Allergies  Allergen Reactions   Atorvastatin Other (See Comments)    Myalgias      CT Chest High Resolution  Result Date: 01/21/2022 CLINICAL DATA:  Interstitial lung disease, rheumatoid arthritis. Shortness of breath on exertion. Right tonsillar cancer 2021. * Tracking Code: BO * EXAM: CT CHEST WITHOUT CONTRAST TECHNIQUE: Multidetector CT imaging of the chest was performed following the standard protocol without intravenous contrast. High resolution imaging of the lungs, as well as inspiratory and expiratory imaging, was performed. RADIATION DOSE REDUCTION: This exam was performed according to the departmental dose-optimization program which includes automated exposure control, adjustment of the mA and/or kV according to patient size and/or use of iterative reconstruction technique. COMPARISON:  PET 03/14/2020, CT chest 01/20/2020. FINDINGS: Cardiovascular: Atherosclerotic calcification of the aorta, aortic valve and coronary arteries. Enlarged pulmonic trunk. No pericardial effusion. Mediastinum/Nodes: Mediastinal lymph nodes measure up to 11 mm in the low right paratracheal station, as before. Hilar regions are difficult to evaluate without IV contrast. No axillary adenopathy. Esophagus is grossly unremarkable. Lungs/Pleura: Centrilobular and paraseptal emphysema with bullous changes in the apices. Biapical pleuroparenchymal scarring and calcification. Peripheral and basilar predominant subpleural reticulation,  ground-glass, traction bronchiectasis/bronchiolectasis and honeycombing, progressive from 01/20/2020. No suspicious pulmonary nodules. No pleural fluid. Tracheobronchomegaly. Expiratory phase imaging was not performed in true expiration, limiting the evaluation for air trapping. Upper Abdomen: Visualized portions of the liver, gallbladder and adrenal glands are unremarkable. 4.6 cm fluid density mass in the right kidney, incompletely visualized but compatible with a cyst on 03/14/2020. No follow-up necessary. Visualized portions of the kidneys, spleen, pancreas, stomach and bowel are otherwise unremarkable. Musculoskeletal: Degenerative changes in the spine. There may be an old left rib fracture. No worrisome lytic or sclerotic lesions. IMPRESSION: 1. Pulmonary parenchymal pattern of fibrosis, as described above, progressive from 01/20/2020. Findings are consistent with UIP per consensus guidelines: Diagnosis of Idiopathic Pulmonary Fibrosis: An Official ATS/ERS/JRS/ALAT Clinical Practice Guideline. [place], Iss 5, [phone], Apr 19 2017. 2. No evidence of metastatic disease. 3. Aortic atherosclerosis (ICD10-I70.0). Coronary artery calcification. 4. Enlarged pulmonic trunk, indicative of pulmonary arterial hypertension. 5.  Emphysema (ICD10-J43.9). Electronically Signed   By: Lorin Picket M.D.   On: 01/21/2022 08:09

## 2022-01-22 NOTE — Telephone Encounter (Signed)
Called and spoke with pt letting him know the info per MR and have scheduled him for follow up in 3 months having PFT prior. Nothing further needed.

## 2022-02-04 DIAGNOSIS — H43812 Vitreous degeneration, left eye: Secondary | ICD-10-CM | POA: Diagnosis not present

## 2022-02-04 DIAGNOSIS — H0288A Meibomian gland dysfunction right eye, upper and lower eyelids: Secondary | ICD-10-CM | POA: Diagnosis not present

## 2022-02-04 DIAGNOSIS — H2513 Age-related nuclear cataract, bilateral: Secondary | ICD-10-CM | POA: Diagnosis not present

## 2022-02-04 DIAGNOSIS — H0288B Meibomian gland dysfunction left eye, upper and lower eyelids: Secondary | ICD-10-CM | POA: Diagnosis not present

## 2022-03-01 ENCOUNTER — Ambulatory Visit (INDEPENDENT_AMBULATORY_CARE_PROVIDER_SITE_OTHER): Payer: Medicare PPO

## 2022-03-01 VITALS — Wt 131.0 lb

## 2022-03-01 DIAGNOSIS — Z0000 Encounter for general adult medical examination without abnormal findings: Secondary | ICD-10-CM | POA: Diagnosis not present

## 2022-03-01 NOTE — Patient Instructions (Signed)
Lawrence Wells , Thank you for taking time to come for your Medicare Wellness Visit. I appreciate your ongoing commitment to your health goals. Please review the following plan we discussed and let me know if I can assist you in the future.   Screening recommendations/referrals: Colonoscopy: declined referral Recommended yearly ophthalmology/optometry visit for glaucoma screening and checkup Recommended yearly dental visit for hygiene and checkup  Vaccinations: Influenza vaccine: 08/10/21 Pneumococcal vaccine: 09/08/19, needs 2nd shot Tdap vaccine: 05/07/17 Shingles vaccine: Shingrix 09/29/19, 11/27/19   Covid-19: 11/22/19, 12/15/19  Advanced directives: no  Conditions/risks identified: none  Next appointment: Follow up in one year for your annual wellness visit. 03/07/23 @ 10:45 am by phone  Preventive Care 68 Years and Older, Male Preventive care refers to lifestyle choices and visits with your health care provider that can promote health and wellness. What does preventive care include? A yearly physical exam. This is also called an annual well check. Dental exams once or twice a year. Routine eye exams. Ask your health care provider how often you should have your eyes checked. Personal lifestyle choices, including: Daily care of your teeth and gums. Regular physical activity. Eating a healthy diet. Avoiding tobacco and drug use. Limiting alcohol use. Practicing safe sex. Taking low doses of aspirin every day. Taking vitamin and mineral supplements as recommended by your health care provider. What happens during an annual well check? The services and screenings done by your health care provider during your annual well check will depend on your age, overall health, lifestyle risk factors, and family history of disease. Counseling  Your health care provider may ask you questions about your: Alcohol use. Tobacco use. Drug use. Emotional well-being. Home and relationship  well-being. Sexual activity. Eating habits. History of falls. Memory and ability to understand (cognition). Work and work Statistician. Screening  You may have the following tests or measurements: Height, weight, and BMI. Blood pressure. Lipid and cholesterol levels. These may be checked every 5 years, or more frequently if you are over 46 years old. Skin check. Lung cancer screening. You may have this screening every year starting at age 55 if you have a 30-pack-year history of smoking and currently smoke or have quit within the past 15 years. Fecal occult blood test (FOBT) of the stool. You may have this test every year starting at age 27. Flexible sigmoidoscopy or colonoscopy. You may have a sigmoidoscopy every 5 years or a colonoscopy every 10 years starting at age 22. Prostate cancer screening. Recommendations will vary depending on your family history and other risks. Hepatitis C blood test. Hepatitis B blood test. Sexually transmitted disease (STD) testing. Diabetes screening. This is done by checking your blood sugar (glucose) after you have not eaten for a while (fasting). You may have this done every 1-3 years. Abdominal aortic aneurysm (AAA) screening. You may need this if you are a current or former smoker. Osteoporosis. You may be screened starting at age 60 if you are at high risk. Talk with your health care provider about your test results, treatment options, and if necessary, the need for more tests. Vaccines  Your health care provider may recommend certain vaccines, such as: Influenza vaccine. This is recommended every year. Tetanus, diphtheria, and acellular pertussis (Tdap, Td) vaccine. You may need a Td booster every 10 years. Zoster vaccine. You may need this after age 17. Pneumococcal 13-valent conjugate (PCV13) vaccine. One dose is recommended after age 55. Pneumococcal polysaccharide (PPSV23) vaccine. One dose is recommended after age 2.  Talk to your health care  provider about which screenings and vaccines you need and how often you need them. This information is not intended to replace advice given to you by your health care provider. Make sure you discuss any questions you have with your health care provider. Document Released: 09/01/2015 Document Revised: 04/24/2016 Document Reviewed: 06/06/2015 Elsevier Interactive Patient Education  2017 [place] Prevention in the Home Falls can cause injuries. They can happen to people of all ages. There are many things you can do to make your home safe and to help prevent falls. What can I do on the outside of my home? Regularly fix the edges of walkways and driveways and fix any cracks. Remove anything that might make you trip as you walk through a door, such as a raised step or threshold. Trim any bushes or trees on the path to your home. Use bright outdoor lighting. Clear any walking paths of anything that might make someone trip, such as rocks or tools. Regularly check to see if handrails are loose or broken. Make sure that both sides of any steps have handrails. Any raised decks and porches should have guardrails on the edges. Have any leaves, snow, or ice cleared regularly. Use sand or salt on walking paths during winter. Clean up any spills in your garage right away. This includes oil or grease spills. What can I do in the bathroom? Use night lights. Install grab bars by the toilet and in the tub and shower. Do not use towel bars as grab bars. Use non-skid mats or decals in the tub or shower. If you need to sit down in the shower, use a plastic, non-slip stool. Keep the floor dry. Clean up any water that spills on the floor as soon as it happens. Remove soap buildup in the tub or shower regularly. Attach bath mats securely with double-sided non-slip rug tape. Do not have throw rugs and other things on the floor that can make you trip. What can I do in the bedroom? Use night lights. Make  sure that you have a light by your bed that is easy to reach. Do not use any sheets or blankets that are too big for your bed. They should not hang down onto the floor. Have a firm chair that has side arms. You can use this for support while you get dressed. Do not have throw rugs and other things on the floor that can make you trip. What can I do in the kitchen? Clean up any spills right away. Avoid walking on wet floors. Keep items that you use a lot in easy-to-reach places. If you need to reach something above you, use a strong step stool that has a grab bar. Keep electrical cords out of the way. Do not use floor polish or wax that makes floors slippery. If you must use wax, use non-skid floor wax. Do not have throw rugs and other things on the floor that can make you trip. What can I do with my stairs? Do not leave any items on the stairs. Make sure that there are handrails on both sides of the stairs and use them. Fix handrails that are broken or loose. Make sure that handrails are as long as the stairways. Check any carpeting to make sure that it is firmly attached to the stairs. Fix any carpet that is loose or worn. Avoid having throw rugs at the top or bottom of the stairs. If you do have throw  rugs, attach them to the floor with carpet tape. Make sure that you have a light switch at the top of the stairs and the bottom of the stairs. If you do not have them, ask someone to add them for you. What else can I do to help prevent falls? Wear shoes that: Do not have high heels. Have rubber bottoms. Are comfortable and fit you well. Are closed at the toe. Do not wear sandals. If you use a stepladder: Make sure that it is fully opened. Do not climb a closed stepladder. Make sure that both sides of the stepladder are locked into place. Ask someone to hold it for you, if possible. Clearly mark and make sure that you can see: Any grab bars or handrails. First and last steps. Where the  edge of each step is. Use tools that help you move around (mobility aids) if they are needed. These include: Canes. Walkers. Scooters. Crutches. Turn on the lights when you go into a dark area. Replace any light bulbs as soon as they burn out. Set up your furniture so you have a clear path. Avoid moving your furniture around. If any of your floors are uneven, fix them. If there are any pets around you, be aware of where they are. Review your medicines with your doctor. Some medicines can make you feel dizzy. This can increase your chance of falling. Ask your doctor what other things that you can do to help prevent falls. This information is not intended to replace advice given to you by your health care provider. Make sure you discuss any questions you have with your health care provider. Document Released: 06/01/2009 Document Revised: 01/11/2016 Document Reviewed: 09/09/2014 Elsevier Interactive Patient Education  2017 [employer].

## 2022-03-01 NOTE — Progress Notes (Signed)
Virtual Visit via Telephone Note  I connected with  Lawrence Wells on 03/01/22 at  9:45 AM EDT by telephone and verified that I am speaking with the correct person using two identifiers.  Location: Patient: home Provider: [facility] Persons participating in the virtual visit: [place]   I discussed the limitations, risks, security and privacy concerns of performing an evaluation and management service by telephone and the availability of in person appointments. The patient expressed understanding and agreed to proceed.  Interactive audio and video telecommunications were attempted between this nurse and patient, however failed, due to patient having technical difficulties OR patient did not have access to video capability.  We continued and completed visit with audio only.  Some vital signs may be absent or patient reported.   Dionisio David, LPN  Subjective:   Lawrence Wells is a 66 y.o. male who presents for Medicare Annual/Subsequent preventive examination.  Review of Systems     Cardiac Risk Factors include: advanced age (>4mn, >>87women);male gender     Objective:    There were no vitals filed for this visit. There is no height or weight on file to calculate BMI.     03/01/2022    9:53 AM 02/27/2021    9:42 AM 05/03/2020   10:55 AM 02/07/2020   12:51 PM 01/19/2020    6:44 PM 10/27/2019    8:40 AM 10/09/2019    8:39 AM  Advanced Directives  Does Patient Have a Medical Advance Directive? No No Yes No No Yes Yes  Type of Advance Directive      Living will Living will  Does patient want to make changes to medical advance directive?      No - Patient declined   Would patient like information on creating a medical advance directive? No - Patient declined    No - Patient declined No - Patient declined No - Patient declined    Current Medications (verified) Outpatient Encounter Medications as of 03/01/2022  Medication Sig   azaTHIOprine (IMURAN) 50 MG tablet  Take 2 tablets (100 mg total) by mouth daily.   ezetimibe (ZETIA) 10 MG tablet Take 1 tablet (10 mg total) by mouth daily.   hydroxychloroquine (PLAQUENIL) 200 MG tablet Take 1 tablet by mouth daily.   Multiple Vitamins-Minerals (CENTRUM SILVER 50+MEN PO) Take by mouth daily.   tobramycin-dexamethasone (TOBRADEX) ophthalmic solution Place into the left eye. (Patient not taking: Reported on 03/01/2022)   No facility-administered encounter medications on file as of 03/01/2022.    Allergies (verified) Atorvastatin   History: Past Medical History:  Diagnosis Date   Arthritis    Collagen vascular disease ([facility]    Rhematoid Arthritis   COPD (chronic obstructive pulmonary disease) ([facility]    Coronary artery disease    Dyspnea    Emphysema lung (HCC)    Hyperlipidemia    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HCC)    Past Surgical History:  Procedure Laterality Date   SINUS SURGERY WITH INSTATRAK     Family History  Problem Relation Age of Onset   Heart disease Mother    Heart attack Mother 722  Arthritis Mother    Heart disease Father    Heart attack Father 560  Healthy Sister    Hyperlipidemia Brother    Throat cancer Brother 634  Heart disease Brother    Healthy Son    Healthy Daughter    Social History   Socioeconomic History   Marital status: Married  Spouse name: Khayree Delellis   Number of children: Not on file   Years of education: High School   Highest education level: Not on file  Occupational History   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[employer])    Comment: Retired 2012  Tobacco Use   Smoking status: Former    Packs/day: 1.50    Years: 30.00    Total pack years: 45.00    Types: Cigarettes    Quit date: 2007    Years since quitting: 16.5   Smokeless tobacco: Former    Types: Chew    Quit date: 2007   Tobacco comments:    Dip smokeless tobacco >20-30 years  Vaping Use   Vaping Use: Never used  Substance and Sexual Activity   Alcohol use: Not Currently    Comment: occ    Drug use: No   Sexual activity: Not Currently  Other Topics Concern   Not on file  Social History Narrative   Right handed   One story home   Drinks caffeine   Social Determinants of Health   Financial Resource Strain: Low Risk  (03/01/2022)   Overall Financial Resource Strain (CARDIA)    Difficulty of Paying Living Expenses: Not hard at all  Food Insecurity: No Food Insecurity (03/01/2022)   Hunger Vital Sign    Worried About Running Out of Food in the Last Year: Never true    [place] in the Last Year: Never true  Transportation Needs: No Transportation Needs (03/01/2022)   PRAPARE - Hydrologist (Medical): No    Lack of Transportation (Non-Medical): No  Physical Activity: Insufficiently Active (03/01/2022)   Exercise Vital Sign    Days of Exercise per Week: 3 days    Minutes of Exercise per Session: 30 min  Stress: No Stress Concern Present (03/01/2022)   [place]    Feeling of Stress : Not at all  Social Connections: Moderately Isolated (03/01/2022)   Social Connection and Isolation Panel [NHANES]    Frequency of Communication with Friends and Family: More than three times a week    Frequency of Social Gatherings with Friends and Family: Twice a week    Attends Religious Services: Never    Marine scientist or Organizations: No    Attends Music therapist: Never    Marital Status: Married    Tobacco Counseling Counseling given: Not Answered Tobacco comments: Dip smokeless tobacco >20-30 years   Clinical Intake:  Pre-visit preparation completed: Yes  Pain : No/denies pain     Nutritional Risks: None Diabetes: No  How often do you need to have someone help you when you read instructions, pamphlets, or other written materials from your doctor or pharmacy?: 1 - Never  Diabetic?no  Interpreter Needed?: No  Information entered by :: Kirke Shaggy, LPN   Activities of Daily Living    03/01/2022    9:54 AM  In your present state of health, do you have any difficulty performing the following activities:  Hearing? 0  Vision? 0  Difficulty concentrating or making decisions? 0  Walking or climbing stairs? 0  Dressing or bathing? 0  Doing errands, shopping? 0  Preparing Food and eating ? N  Using the Toilet? N  In the past six months, have you accidently leaked urine? N  Do you have problems with loss of bowel control? N  Managing your Medications? N  Managing your  Finances? N  Housekeeping or managing your Housekeeping? N    Patient Care Team: Olin Hauser, DO as PCP - General (Family Medicine)  Indicate any recent Medical Services you may have received from other than [facility] providers in the past year (date may be approximate).     Assessment:   This is a routine wellness examination for Lawrence Wells.  Hearing/Vision screen Hearing Screening - Comments:: No aids Vision Screening - Comments:: No glasses  Dietary issues and exercise activities discussed: Current Exercise Habits: Home exercise routine, Type of exercise: walking, Time (Minutes): 30, Frequency (Times/Week): 3, Weekly Exercise (Minutes/Week): 90, Intensity: Mild   Goals Addressed             This Visit's Progress    DIET - EAT MORE FRUITS AND VEGETABLES         Depression Screen    03/01/2022    9:51 AM 12/26/2021    4:07 PM 10/17/2021   11:10 AM 02/27/2021    9:43 AM 12/08/2019    7:36 AM 12/01/2019    8:42 AM 10/28/2019   11:47 AM  PHQ 2/9 Scores  PHQ - 2 Score 0 0 4 0 1 0 1  PHQ- 9 Score '2 6 11  4 4 8    '$ Fall Risk    03/01/2022    9:54 AM 12/26/2021    4:07 PM 10/17/2021   11:10 AM 07/30/2021   11:31 AM 02/27/2021    9:43 AM  Fall Risk   Falls in the past year? 0 0 0 0 0  Number falls in past yr: 1 0 0 0   Injury with Fall? 0 0 0 0   Risk for fall due to : No Fall Risks;History of fall(s) No Fall Risks No Fall Risks  Medication  side effect  Follow up Falls prevention discussed;Falls evaluation completed Falls evaluation completed Falls evaluation completed Falls evaluation completed Falls evaluation completed;Education provided;Falls prevention discussed    FALL RISK PREVENTION PERTAINING TO THE HOME:  Any stairs in or around the home? No  If so, are there any without handrails? No  Home free of loose throw rugs in walkways, pet beds, electrical cords, etc? Yes  Adequate lighting in your home to reduce risk of falls? Yes   ASSISTIVE DEVICES UTILIZED TO PREVENT FALLS:  Life alert? No  Use of a cane, walker or w/c? No  Grab bars in the bathroom? Yes  Shower chair or bench in shower? No  Elevated toilet seat or a handicapped toilet? No   Cognitive Function:        03/01/2022    9:55 AM 02/27/2021    9:45 AM  6CIT Screen  What Year? 0 points 0 points  What month? 0 points 0 points  What time? 0 points 0 points  Count back from 20 0 points 0 points  Months in reverse 0 points 0 points  Repeat phrase 0 points 0 points  Total Score 0 points 0 points    Immunizations Immunization History  Administered Date(s) Administered   Fluad Quad(high Dose 65+) 08/10/2021   Influenza, High Dose Seasonal PF 07/28/2020   PFIZER(Purple Top)SARS-COV-2 Vaccination 11/22/2019, 12/15/2019   Pneumococcal Conjugate-13 09/08/2019   Tdap 04/19/2015, 05/07/2017   Zoster Recombinat (Shingrix) 09/29/2019, 11/27/2019    TDAP status: Up to date  Flu Vaccine status: Up to date  Pneumococcal vaccine status: Due, Education has been provided regarding the importance of this vaccine. Advised may receive this vaccine at local pharmacy or Health  Dept. Aware to provide a copy of the vaccination record if obtained from local pharmacy or Health Dept. Verbalized acceptance and understanding.  Covid-19 vaccine status: Completed vaccines  Qualifies for Shingles Vaccine? Yes   Zostavax completed No   Shingrix Completed?:  Yes  Screening Tests Health Maintenance  Topic Date Due   COLONOSCOPY (Pts 45-70yr Insurance coverage will need to be confirmed)  Never done   COVID-19 Vaccine (3 - Pfizer risk series) 01/12/2020   Pneumonia Vaccine 68 Years old (2 - PPSV23 or PCV20) 07/31/2022 (Originally 11/03/2019)   INFLUENZA VACCINE  03/19/2022   TETANUS/TDAP  05/08/2027   Hepatitis C Screening  Completed   Zoster Vaccines- Shingrix  Completed   HPV VACCINES  Aged Out    Health Maintenance  Health Maintenance Due  Topic Date Due   COLONOSCOPY (Pts 45-461yrInsurance coverage will need to be confirmed)  Never done   COVID-19 Vaccine (3 - Pfizer risk series) 01/12/2020    Declined referral for colonscopy  Lung Cancer Screening: (Low Dose CT Chest recommended if Age 68-80ears, 30 pack-year currently smoking OR have quit w/in 15years.) does not qualify.   Additional Screening:  Hepatitis C Screening: does qualify; Completed 05/07/17  Vision Screening: Recommended annual ophthalmology exams for early detection of glaucoma and other disorders of the eye. Is the patient up to date with their annual eye exam?  No  Who is the provider or what is the name of the office in which the patient attends annual eye exams? No one If pt is not established with a provider, would they like to be referred to a provider to establish care? No .   Dental Screening: Recommended annual dental exams for proper oral hygiene  Community Resource Referral / Chronic Care Management: CRR required this visit?  No   CCM required this visit?  No      Plan:     I have personally reviewed and noted the following in the patient's chart:   Medical and social history Use of alcohol, tobacco or illicit drugs  Current medications and supplements including opioid prescriptions. Patient is not currently taking opioid prescriptions. Functional ability and status Nutritional status Physical activity Advanced directives List of other  physicians Hospitalizations, surgeries, and ER visits in previous 12 months Vitals Screenings to include cognitive, depression, and falls Referrals and appointments  In addition, I have reviewed and discussed with patient certain preventive protocols, quality metrics, and best practice recommendations. A written personalized care plan for preventive services as well as general preventive health recommendations were provided to patient.     LoDionisio DavidLPN   02/[birth year]/2706 Nurse Notes: none

## 2022-03-04 ENCOUNTER — Ambulatory Visit: Payer: Medicare PPO | Admitting: Family Medicine

## 2022-03-04 ENCOUNTER — Ambulatory Visit: Payer: Self-pay

## 2022-03-04 ENCOUNTER — Encounter: Payer: Self-pay | Admitting: Family Medicine

## 2022-03-04 VITALS — BP 103/82 | HR 67 | Ht 71.0 in | Wt 124.4 lb

## 2022-03-04 DIAGNOSIS — R112 Nausea with vomiting, unspecified: Secondary | ICD-10-CM

## 2022-03-04 DIAGNOSIS — R519 Headache, unspecified: Secondary | ICD-10-CM | POA: Diagnosis not present

## 2022-03-04 DIAGNOSIS — R42 Dizziness and giddiness: Secondary | ICD-10-CM | POA: Diagnosis not present

## 2022-03-04 DIAGNOSIS — Z85819 Personal history of malignant neoplasm of unspecified site of lip, oral cavity, and pharynx: Secondary | ICD-10-CM | POA: Diagnosis not present

## 2022-03-04 DIAGNOSIS — G43009 Migraine without aura, not intractable, without status migrainosus: Secondary | ICD-10-CM | POA: Diagnosis not present

## 2022-03-04 MED ORDER — RIZATRIPTAN BENZOATE 10 MG PO TBDP: 10.0000 mg | ORAL_TABLET | ORAL | 0 refills | Status: DC | PRN

## 2022-03-04 MED ORDER — MECLIZINE HCL 25 MG PO TABS: 25.0000 mg | ORAL_TABLET | Freq: Three times a day (TID) | ORAL | 2 refills | Status: DC | PRN

## 2022-03-04 NOTE — Progress Notes (Signed)
Subjective:    Patient ID: Lawrence Wells, male    DOB: Dec 27, 1955, 66 y.o.   MRN: [id number]  Lawrence Wells is a 66 y.o. male presenting on 03/04/2022 for Headache and Nausea   HPI  Headache, Nausea Vertigo Dizziness  Reports symptoms started Friday AM 7/14, nausea triggered vomit but mostly liquid. Vertigo triggered with some movements with some room spinning  Unsure if ear pressure In past has had to get ears cleaned in past. But not having other symptoms  History of headaches, no confirmed history of migraines. Not on therapy.  Wife has some phenergan he has tried over weekend Tolerating PO but reduced appetite No abdominal pain Denies any fever chills     03/01/2022    9:51 AM 12/26/2021    4:07 PM 10/17/2021   11:10 AM  Depression screen PHQ 2/9  Decreased Interest 0 0 2  Down, Depressed, Hopeless 0 0 2  PHQ - 2 Score 0 0 4  Altered sleeping '1 2 1  '$ Tired, decreased energy '1 1 1  '$ Change in appetite 0 0 1  Feeling bad or failure about yourself  0 1 2  Trouble concentrating 0 0 1  Moving slowly or fidgety/restless 0 1 0  Suicidal thoughts 0 1 1  PHQ-9 Score '2 6 11  '$ Difficult doing work/chores Not difficult at all Somewhat difficult Somewhat difficult    Social History   Tobacco Use   Smoking status: Former    Packs/day: 1.50    Years: 30.00    Total pack years: 45.00    Types: Cigarettes    Quit date: 2007    Years since quitting: 16.5   Smokeless tobacco: Former    Types: Chew    Quit date: 2007   Tobacco comments:    Dip smokeless tobacco >20-30 years  Vaping Use   Vaping Use: Never used  Substance Use Topics   Alcohol use: Not Currently    Comment: occ   Drug use: No    Review of Systems Per HPI unless specifically indicated above     Objective:    BP 103/82   Pulse 67   Ht '5\' 11"'$  (1.803 m)   Wt 124 lb 6.4 oz (56.4 kg)   SpO2 98%   BMI 17.35 kg/m   Wt Readings from Last 3 Encounters:  03/04/22 124 lb 6.4 oz (56.4 kg)  03/01/22 131  lb (59.4 kg)  01/17/22 131 lb (59.4 kg)    Physical Exam Vitals and nursing note reviewed.  Constitutional:      General: He is not in acute distress.    Appearance: He is well-developed. He is not diaphoretic.     Comments: Well-appearing, comfortable, cooperative  HENT:     Head: Normocephalic and atraumatic.     Right Ear: Tympanic membrane, ear canal and external ear normal. There is no impacted cerumen.     Left Ear: Tympanic membrane, ear canal and external ear normal. There is impacted cerumen.  Eyes:     General:        Right eye: No discharge.        Left eye: No discharge.     Conjunctiva/sclera: Conjunctivae normal.  Neck:     Thyroid: No thyromegaly.  Cardiovascular:     Rate and Rhythm: Normal rate and regular rhythm.     Pulses: Normal pulses.     Heart sounds: Normal heart sounds. No murmur heard. Pulmonary:     Effort: Pulmonary effort  is normal. No respiratory distress.     Breath sounds: Normal breath sounds. No wheezing or rales.  Musculoskeletal:        General: Normal range of motion.     Cervical back: Normal range of motion and neck supple.  Lymphadenopathy:     Cervical: No cervical adenopathy.  Skin:    General: Skin is warm and dry.     Findings: No erythema or rash.  Neurological:     General: No focal deficit present.     Mental Status: He is alert and oriented to person, place, and time. Mental status is at baseline.     Cranial Nerves: No cranial nerve deficit.     Sensory: No sensory deficit.     Motor: No weakness.     Gait: Gait normal.  Psychiatric:        Behavior: Behavior normal.     Comments: Well groomed, good eye contact, normal speech and thoughts       Results for orders placed or performed in visit on 01/17/22  Pulmonary function test  Result Value Ref Range   FVC-Pre 4.08 L   FVC-%Pred-Pre 86 %   FEV1-Pre 3.33 L   FEV1-%Pred-Pre 95 %   FEV6-Pre 4.07 L   FEV6-%Pred-Pre 91 %   Pre FEV1/FVC ratio 82 %    FEV1FVC-%Pred-Pre 110 %   Pre FEV6/FVC Ratio 100 %   FEV6FVC-%Pred-Pre 105 %   FEF 25-75 Pre 2.70 L/sec   FEF2575-%Pred-Pre 99 %   DLCO unc 9.05 ml/min/mmHg   DLCO unc % pred 33 %   DLCO cor 9.24 ml/min/mmHg   DLCO cor % pred 33 %   DL/VA 1.70 ml/min/mmHg/L   DL/VA % pred 41 %      Assessment & Plan:   Problem List Items Addressed This Visit   None Visit Diagnoses     Vertigo    -  Primary   Relevant Medications   meclizine (ANTIVERT) 25 MG tablet   Other Relevant Orders   CT HEAD WO CONTRAST (5MM)   Acute nonintractable headache, unspecified headache type       Relevant Medications   rizatriptan (MAXALT-MLT) 10 MG disintegrating tablet   Other Relevant Orders   CT HEAD WO CONTRAST (5MM)   Nausea and vomiting, unspecified vomiting type       History of oropharyngeal cancer       Migraine without aura and without status migrainosus, not intractable       Relevant Medications   rizatriptan (MAXALT-MLT) 10 MG disintegrating tablet       Concerned with constellation of acute onset symptoms headache dizziness vertigo nausea  No focal neurological deficits Concern w/ oropharyngeal cancer history Has upcoming eval with [facility] this Fall but will contact sooner for surveillance / updates  Ordered - Future Head CT Scan ordered [facility]  Headaches Ordered migraine medicine Maxalt Rizatriptan instructions for only as needed, see if this works then may be migraine headache with vertigo / vestibular symptoms  Use the Phenergan if you need for nausea  After CT Scan we can consider referral to ENT vs Neuro referral depending course, if negative scan and headaches improved, would pursue ENT, if CT shows any concerns or headaches persistent would refer to Neuro  Vertigo Rx Meclizine PRN Epley Maneuver home exercise  Return criteria given if severe symptoms unresolved seek care at hospital ED sooner if indicated   Orders Placed This Encounter   Procedures   CT HEAD  WO CONTRAST (5MM)    Standing Status:   Future    Standing Expiration Date:   03/05/2023    Order Specific Question:   Preferred imaging location?    Answer:   [facility]      Meds ordered this encounter  Medications   meclizine (ANTIVERT) 25 MG tablet    Sig: Take 1 tablet (25 mg total) by mouth 3 (three) times daily as needed for dizziness.    Dispense:  30 tablet    Refill:  2   rizatriptan (MAXALT-MLT) 10 MG disintegrating tablet    Sig: Take 1 tablet (10 mg total) by mouth as needed for migraine. May repeat in 2 hours if needed    Dispense:  10 tablet    Refill:  0      Follow up plan: Return if symptoms worsen or fail to improve.   Nobie Putnam, [facility] 03/04/2022, 11:44 AM

## 2022-03-04 NOTE — Telephone Encounter (Signed)
  Chief Complaint: Headache, dizziness, nausea. Took antiemetic which helped. BP 100/60 pulse 61. Symptoms: Above Frequency: Started Friday Pertinent Negatives: Patient denies fever Disposition: '[]'$ ED /'[]'$ Urgent Care (no appt availability in office) / '[]'$ Appointment(In office/virtual)/ '[]'$  [facility]/ '[]'$ Home Care/ '[]'$ Refused Recommended Disposition /'[]'$ [facility]/ '[]'$  Follow-up with PCP Additional Notes: Wife asking for an afternoon appointment for pt. No availability. Asking if PCP will work him in "since he is a cancer pt."

## 2022-03-04 NOTE — Patient Instructions (Addendum)
Thank you for coming to the office today.  Future Head CT Scan ordered [facility]  Ordered migraine medicine Maxalt Rizatriptan instructions for only as needed  Use the Phenergan if you need for nausea  After CT Scan we can consider referral to ENT vs Neuro  1. You have symptoms of Vertigo (Benign Paroxysmal Positional Vertigo) - This is commonly caused by inner ear fluid imbalance, sometimes can be worsened by allergies and sinus symptoms, otherwise it can occur randomly sometimes and we may never discover the exact cause. - To treat this, try the Epley Manuever (see diagrams/instructions below) at home up to 3 times a day for 1-2 weeks or until symptoms resolve - You may take Meclizine as needed up to 3 times a day for dizziness, this will not cure symptoms but may help. Caution may make you drowsy.  If you develop significant worsening episode with vertigo that does not improve and you get severe headache, loss of vision, arm or leg weakness, slurred speech, or other concerning symptoms please seek immediate medical attention at Emergency Department.  Please schedule a follow-up appointment with Dr Parks Ranger within 4 weeks if Vertigo not improving, and will consider Referral to Vestibular Rehab  See the next page for images describing the Epley Manuever.     ----------------------------------------------------------------------------------------------------------------------       Please schedule a Follow-up Appointment to: Return if symptoms worsen or fail to improve.  If you have any other questions or concerns, please feel free to call the office or send a message through [place]. You may also schedule an earlier appointment if necessary.  Additionally, you may be receiving a survey about your experience at our office within a few days to 1 week by e-mail or mail. We value your feedback.  Nobie Putnam, DO [place]

## 2022-03-04 NOTE — Telephone Encounter (Signed)
I called patient and offered him an open appt at 11:20 today.  I will call back if he can not make the appt.

## 2022-03-05 ENCOUNTER — Ambulatory Visit: Payer: Medicare HMO

## 2022-03-08 ENCOUNTER — Ambulatory Visit
Admission: RE | Admit: 2022-03-08 | Discharge: 2022-03-08 | Disposition: A | Discharge status: Home / Self Care | Payer: Medicare PPO | Source: Ambulatory Visit | Attending: Family Medicine | Admitting: Family Medicine

## 2022-03-08 DIAGNOSIS — R519 Headache, unspecified: Secondary | ICD-10-CM | POA: Diagnosis not present

## 2022-03-08 DIAGNOSIS — R42 Dizziness and giddiness: Secondary | ICD-10-CM | POA: Diagnosis not present

## 2022-03-08 DIAGNOSIS — Z85819 Personal history of malignant neoplasm of unspecified site of lip, oral cavity, and pharynx: Secondary | ICD-10-CM | POA: Diagnosis not present

## 2022-03-08 NOTE — Addendum Note (Signed)
Addended by: Olin Hauser on: 03/08/2022 12:34 PM   Modules accepted: Orders

## 2022-04-04 DIAGNOSIS — Z85818 Personal history of malignant neoplasm of other sites of lip, oral cavity, and pharynx: Secondary | ICD-10-CM | POA: Diagnosis not present

## 2022-04-04 DIAGNOSIS — H90A21 Sensorineural hearing loss, unilateral, right ear, with restricted hearing on the contralateral side: Secondary | ICD-10-CM | POA: Diagnosis not present

## 2022-04-04 DIAGNOSIS — H6123 Impacted cerumen, bilateral: Secondary | ICD-10-CM | POA: Diagnosis not present

## 2022-04-04 DIAGNOSIS — H9122 Sudden idiopathic hearing loss, left ear: Secondary | ICD-10-CM | POA: Diagnosis not present

## 2022-04-04 DIAGNOSIS — R42 Dizziness and giddiness: Secondary | ICD-10-CM | POA: Diagnosis not present

## 2022-04-05 ENCOUNTER — Other Ambulatory Visit: Payer: Self-pay | Admitting: Otolaryngology

## 2022-04-05 DIAGNOSIS — Z923 Personal history of irradiation: Secondary | ICD-10-CM

## 2022-04-05 DIAGNOSIS — R1310 Dysphagia, unspecified: Secondary | ICD-10-CM

## 2022-04-11 DIAGNOSIS — J849 Interstitial pulmonary disease, unspecified: Secondary | ICD-10-CM | POA: Diagnosis not present

## 2022-04-11 DIAGNOSIS — Z79899 Other long term (current) drug therapy: Secondary | ICD-10-CM | POA: Diagnosis not present

## 2022-04-11 DIAGNOSIS — M0579 Rheumatoid arthritis with rheumatoid factor of multiple sites without organ or systems involvement: Secondary | ICD-10-CM | POA: Diagnosis not present

## 2022-04-15 DIAGNOSIS — R1314 Dysphagia, pharyngoesophageal phase: Secondary | ICD-10-CM | POA: Diagnosis not present

## 2022-04-15 DIAGNOSIS — H90A21 Sensorineural hearing loss, unilateral, right ear, with restricted hearing on the contralateral side: Secondary | ICD-10-CM | POA: Diagnosis not present

## 2022-04-15 DIAGNOSIS — K219 Gastro-esophageal reflux disease without esophagitis: Secondary | ICD-10-CM | POA: Diagnosis not present

## 2022-04-15 DIAGNOSIS — H9123 Sudden idiopathic hearing loss, bilateral: Secondary | ICD-10-CM | POA: Diagnosis not present

## 2022-04-15 DIAGNOSIS — R682 Dry mouth, unspecified: Secondary | ICD-10-CM | POA: Diagnosis not present

## 2022-04-19 ENCOUNTER — Ambulatory Visit
Admission: RE | Admit: 2022-04-19 | Discharge: 2022-04-19 | Disposition: A | Discharge status: Home / Self Care | Payer: Medicare PPO | Source: Ambulatory Visit | Attending: Otolaryngology | Admitting: Otolaryngology

## 2022-04-19 DIAGNOSIS — Z923 Personal history of irradiation: Secondary | ICD-10-CM

## 2022-04-19 DIAGNOSIS — R1310 Dysphagia, unspecified: Secondary | ICD-10-CM

## 2022-04-19 NOTE — Progress Notes (Signed)
Modified Barium Swallow Progress Note  Patient Details  Name: Lawrence Wells MRN: [id number] Date of Birth: Jul 01, 1956  Today's Date: 04/19/2022  Modified Barium Swallow completed.  Full report located under Chart Review in the Imaging Section.  Brief recommendations include the following:  Clinical Impression  Pt with reduced anterior laryngeal movements as observed in patients who are s/p head and neck radiation. This results in decreased epiglottic deflection, reduced airway closure and residue within the vallecula and pyriform sinuses. When consuming thin liquids, pt presents with intermittent trace penetration. All penetrates clear with subsequent swallows. When consuming puree and graham with puree, pt has moderate vallecular residue that he is sensate to which he then hocks up into the oral cavity and re-swallows. No diet modifications recommended at this time, but do recommend Outpatient ST services to target increased movement of the larynx and hopeful increased PO consumption.   Swallow Evaluation Recommendations       SLP Diet Recommendations: Regular solids;Thin liquid   Liquid Administration via: Cup;Straw   Medication Administration: Whole meds with liquid   Supervision: Patient able to self feed   Compensations: Minimize environmental distractions;Slow rate;Small sips/bites   Postural Changes: Remain semi-upright after after feeds/meals (Comment);Seated upright at 90 degrees   Oral Care Recommendations: Oral care BID      Caleesi Kohl B. Rutherford Nail, M.S., CCC-SLP, Mining engineer Certified Brain Injury [place]  [place] Office [phone] Ascom [phone] Fax [phone]

## 2022-04-24 ENCOUNTER — Ambulatory Visit
Admission: RE | Admit: 2022-04-24 | Discharge: 2022-04-24 | Disposition: A | Discharge status: Home / Self Care | Payer: Medicare PPO | Source: Ambulatory Visit | Attending: Internal Medicine | Admitting: Internal Medicine

## 2022-04-24 DIAGNOSIS — M359 Systemic involvement of connective tissue, unspecified: Secondary | ICD-10-CM | POA: Diagnosis not present

## 2022-04-24 DIAGNOSIS — I2510 Atherosclerotic heart disease of native coronary artery without angina pectoris: Secondary | ICD-10-CM | POA: Insufficient documentation

## 2022-04-24 DIAGNOSIS — I340 Nonrheumatic mitral (valve) insufficiency: Secondary | ICD-10-CM | POA: Diagnosis not present

## 2022-04-24 DIAGNOSIS — J8489 Other specified interstitial pulmonary diseases: Secondary | ICD-10-CM | POA: Insufficient documentation

## 2022-04-24 DIAGNOSIS — E785 Hyperlipidemia, unspecified: Secondary | ICD-10-CM | POA: Insufficient documentation

## 2022-04-24 DIAGNOSIS — I2584 Coronary atherosclerosis due to calcified coronary lesion: Secondary | ICD-10-CM | POA: Insufficient documentation

## 2022-04-24 DIAGNOSIS — R0609 Other forms of dyspnea: Secondary | ICD-10-CM

## 2022-04-24 DIAGNOSIS — J439 Emphysema, unspecified: Secondary | ICD-10-CM | POA: Diagnosis not present

## 2022-04-24 LAB — ECHOCARDIOGRAM COMPLETE
AR max vel: 2.83 cm2
AV Area VTI: 3.18 cm2
AV Area mean vel: 2.81 cm2
AV Mean grad: 3 mmHg
AV Peak grad: 6.3 mmHg
Ao pk vel: 1.25 m/s
Area-P 1/2: 5.7 cm2
S' Lateral: 2.74 cm

## 2022-04-24 NOTE — Progress Notes (Signed)
*  PRELIMINARY RESULTS* Echocardiogram 2D Echocardiogram has been performed.  Lawrence Wells 04/24/2022, 9:45 AM

## 2022-04-29 ENCOUNTER — Other Ambulatory Visit: Payer: Self-pay

## 2022-04-29 DIAGNOSIS — J849 Interstitial pulmonary disease, unspecified: Secondary | ICD-10-CM

## 2022-04-30 ENCOUNTER — Telehealth: Payer: Self-pay | Admitting: Pharmacist

## 2022-04-30 ENCOUNTER — Encounter: Payer: Self-pay | Admitting: Internal Medicine

## 2022-04-30 ENCOUNTER — Ambulatory Visit (INDEPENDENT_AMBULATORY_CARE_PROVIDER_SITE_OTHER): Payer: Medicare PPO | Admitting: Internal Medicine

## 2022-04-30 VITALS — BP 108/64 | HR 68 | Ht 70.0 in | Wt 128.6 lb

## 2022-04-30 DIAGNOSIS — Z23 Encounter for immunization: Secondary | ICD-10-CM

## 2022-04-30 DIAGNOSIS — J849 Interstitial pulmonary disease, unspecified: Secondary | ICD-10-CM

## 2022-04-30 LAB — PULMONARY FUNCTION TEST
DL/VA % pred: 49 %
DL/VA: 2.02 ml/min/mmHg/L
DLCO cor % pred: 40 %
DLCO cor: 10.62 ml/min/mmHg
DLCO unc % pred: 39 %
DLCO unc: 10.43 ml/min/mmHg
FEF 25-75 Pre: 2.9 L/sec
FEF2575-%Pred-Pre: 112 %
FEV1-%Pred-Pre: 97 %
FEV1-Pre: 3.23 L
FEV1FVC-%Pred-Pre: 108 %
FEV6-%Pred-Pre: 92 %
FEV6-Pre: 3.93 L
FEV6FVC-%Pred-Pre: 105 %
FVC-%Pred-Pre: 89 %
FVC-Pre: 4.01 L
Pre FEV1/FVC ratio: 81 %
Pre FEV6/FVC Ratio: 100 %

## 2022-04-30 LAB — CBC WITH DIFFERENTIAL/PLATELET
Basophils Absolute: 0.1 10*3/uL (ref 0.0–0.1)
Basophils Relative: 0.8 % (ref 0.0–3.0)
Eosinophils Absolute: 0.1 10*3/uL (ref 0.0–0.7)
Eosinophils Relative: 1.9 % (ref 0.0–5.0)
HCT: 42.5 % (ref 39.0–52.0)
Hemoglobin: 14.3 g/dL (ref 13.0–17.0)
Lymphocytes Relative: 10.8 % — ABNORMAL LOW (ref 12.0–46.0)
Lymphs Abs: 0.7 10*3/uL (ref 0.7–4.0)
MCHC: 33.7 g/dL (ref 30.0–36.0)
MCV: 102.2 fl — ABNORMAL HIGH (ref 78.0–100.0)
Monocytes Absolute: 0.9 10*3/uL (ref 0.1–1.0)
Monocytes Relative: 13.6 % — ABNORMAL HIGH (ref 3.0–12.0)
Neutro Abs: 4.7 10*3/uL (ref 1.4–7.7)
Neutrophils Relative %: 72.9 % (ref 43.0–77.0)
Platelets: 254 10*3/uL (ref 150.0–400.0)
RBC: 4.16 Mil/uL — ABNORMAL LOW (ref 4.22–5.81)
RDW: 14.4 % (ref 11.5–15.5)
WBC: 6.4 10*3/uL (ref 4.0–10.5)

## 2022-04-30 LAB — HEPATIC FUNCTION PANEL
ALT: 14 U/L (ref 0–53)
AST: 21 U/L (ref 0–37)
Albumin: 4.1 g/dL (ref 3.5–5.2)
Alkaline Phosphatase: 62 U/L (ref 39–117)
Bilirubin, Direct: 0.1 mg/dL (ref 0.0–0.3)
Total Bilirubin: 0.3 mg/dL (ref 0.2–1.2)
Total Protein: 7 g/dL (ref 6.0–8.3)

## 2022-04-30 LAB — BRAIN NATRIURETIC PEPTIDE: Pro B Natriuretic peptide (BNP): 61 pg/mL (ref 0.0–100.0)

## 2022-04-30 NOTE — Patient Instructions (Addendum)
ICD-10-CM   1. Interstitial lung disease due to connective tissue disease ([place])  J84.89    M35.9     2. Rheumatoid arthritis involving multiple sites with positive rheumatoid factor ([facility])  M05.79     3. Pulmonary emphysema, unspecified emphysema type ([place])  J43.9       Disease slowly progressive on CT chest over 2 years though clinically you are stable and stable on PFT June -> Sept 2023  The UIP pattern of fibrosis marks future risk for progression as well  Prior intolerance to ofev  Mild heart muschle stiffness and emphysema also contributing to shortness of breath  Plan - check bmet, cbc, lft 04/30/2022 - start pirfenidone/esbriet per protocol  - max is low dose of 2 pills three times daily  - take 5-6h apart with food, appply sunscreen and drink water a lot - high dose flu shot 04/30/2022 - RSV vaccine and covid mRNA in fall 2023 - consider spiriva at followup   Followup  - 6 weeks  - 8 weeks with APP (can bein BRL ) for esbiret uptake - 12-16 weeks with DR Chase Caller;  Dr Chase Caller - onsite visit - 30 min   - symptoms score and walk test at followuo

## 2022-04-30 NOTE — Telephone Encounter (Signed)
Received new start paperwork for Esbriet/pirfenidone  Please start BIV.  Dose: 1 tab ('267mg'$ ) three times daily x 1 week then increase to 2 tabs ('534mg'$ ) three times daily thereafter **LOW DOSE AS MAINTENANCE**  Previously tried therapies: Ofev - Feb 2021 - stopped due to weight loss  Due to non-IPF diagnosis, anticipate denial that will require appeal. Celso Amy pt assistance application placed in PAP pending info folder in pharmacy office pending PA determination  Knox Saliva, PharmD, MPH, BCPS, CPP Clinical Pharmacist (Rheumatology and Pulmonology)

## 2022-04-30 NOTE — Telephone Encounter (Signed)
Submitted a Prior Authorization request to [facility] for PIRFENIDONE via CoverMyMeds. Will update once we receive a response.  Key: Alen Bleacher, PharmD, MPH, BCPS, CPP Clinical Pharmacist (Rheumatology and Pulmonology)

## 2022-04-30 NOTE — Progress Notes (Signed)
Spirometry and DLCO Performed Today.  

## 2022-04-30 NOTE — Progress Notes (Signed)
OV 09/10/2019  Subjective:  Patient ID: Lawrence Wells, male , DOB: 1955/09/03 , age 66 y.o. , MRN: [id number] , ADDRESS: [place] [address]   09/10/2019 -   Chief Complaint  Patient presents with   pulmonary consult    per Dr. Parks Ranger- CXR 09/08/2019. pt reports of sob with exertion, talking and bending, prod cough with yellow mucus, left sided chest discomfort  and wheezing.     HPI Lawrence Wells 66 y.o. -referred for interstitial lung disease after a chest x-ray from September 08, 2019 that I personally visualized and interpreted shows diffuse ILD along with left upper lobe nodule.  Patient has wife tell me that he has had insidious onset of shortness of breath for a year with progression in the last few months.  They also tell me that few years ago he had an chest x-ray done for an incidental unrelated reason that showed chronic changes of scarring but apparently were reassured.  In review of his chart and noticed that in 2005 this is chest x-ray done in the [facility] system with reports of ILD in it.  He is unaware of that.  History for this visit is given by the patient and his wife and review of the chart. [employer] Comprehensive ILD Questionnaire  Symptoms:   Insidious onset of shortness of breath for the last year getting worse in the last 3 to 4 months.  Definitely no dyspnea a few years ago even though chest x-ray from 2005 was reported as ILD.  Sometimes dyspnea is episodic but mostly with exertion relieved by rest.  Symptom severity is listed below.  There is associated significant cough.  Cough started in October 2020.  It is getting worse cough is rated as moderate to severe.  He coughs at night.  He does bring up some yellow phlegm.  It is worse when he lies down.Marland Kitchen  He does clear his throat and he does feel a tickle in the back of the throat.  There is also associated wheezing.       Past Medical History : He gives a history positive  for arthritis.  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  Denies any asthma or known COPD.  Denies heart failure.  Denies scleroderma any collagen vascular disease.  Denies diagnosis of acid reflux or hiatal hernia.  Denies obstructive sleep apnea.  Denies pulmonary hypertension.  Denies diabetes or thyroid disease or stroke or seizures.  Denies infectious mononucleosis.  Denies hepatitis.  Denies tuberculosis denies kidney disease.  Denies blood clots denies heart disease denies pleurisy.   ROS: Positive for fatigue and arthralgia.  Positive for acid reflux.  He went to check the box for [name].  Denies any GI symptoms or rash or ulcers  FAMILY HISTORY of LUNG DISEASE: Denies including COPD and pulmonary fibrosis   EXPOSURE HISTORY: Smokes cigarettes between 1983 and 2007 1 to 2 packs/day and then quit.  Did not smoke cigars did not smoke pipe.  Did not vape.  Never smoked marijuana or cocaine.  Never used intravenous drugs.   HOME and HOBBY DETAILS : [place].  The age of the home is 20 years.  There is no dampness in the living environment.  No mold or mildew.  Does not  use humidifier.  No CPAP use.  Does not use nebulizer.  No steam iron use.  No Jacuzzi.  No pet birds or parakeets in the house.  No pet gerbils.  No feather pillows.  No mold in the [facility] duct.  Does not play wind instruments.  Does not do any gardening.   OCCUPATIONAL HISTORY (122 questions) : Works in the Transport planner for 30 years.  Worked in [employer] and crawl spaces.  A lot of dust exposure.  During this time he worked in damp air-conditioned spaces.  He thinks he had asbestos exposure.  He also worked in the [employer].  He cleaned AC coils.  Otherwise history is negative   PULMONARY TOXICITY HISTORY (27 items): Entirely  negative.       ROS - per HPI    OV 10/22/2019  Subjective:  Patient ID: Lawrence Wells, male , DOB: [birth year] , age 50 y.o. , MRN: [id number] , ADDRESS: [place] [address]   10/22/2019 -   Chief Complaint  Patient presents with   Follow-up    ILD Followup. SOB on exertion. Non prod cough. Pt denies any wheezing, fever, chills, or sweats.   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  Started on nintedanib 10/12/2019  Associated emphysema present on CT scan January 2021  Normal cardiac stress test 10/14/2019   HPI Lawrence Wells 66 y.o. -presents for follow-up with his wife.  At last saw him end of January 2021 for ILD evaluation.  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  He then saw a rheumatologist Dr. D yesterday 10/21/2019 and given a diagnosis of joint rheumatoid arthritis.  In the interim he also saw pulmonary nurse practitioner for a virtual visit and was started on nintedanib but she started [place] on 10/12/2019.  So far is tolerating it fine.  He is using oxygen only with exertion.  There is no deterioration in symptoms.  Yesterday the rheumatology visit Imuran has been recommended first rheumatoid arthritis.  The TPMT test has been done and the results are pending.  He does not have liver function tests checked after he started his nintedanib.  He and his wife have many questions about the future course of the disease and natural history and other management strategies.  Currently is free was not helping him but he is willing to take it because of the associated emphysema.   I personally visualized and reviewed the  CT and also interpreted the findings myself   OV 12/02/2019  Subjective:  Patient ID: Lawrence Wells, male , DOB: Jun 25, 1956 , age 38 y.o. , MRN: [id number] , ADDRESS: 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  Started on nintedanib 10/12/2019  Associated emphysema present on CT scan January 2021  Normal cardiac stress test 10/14/2019  Rheumatoid arthritis on Imuran  HPI Jencarlo E Letts 66 y.o. -6 returns for follow-up with his wife  to the [place] clinic.  He is a Engineer, manufacturing systems.  He normally seen in the [place] clinic.  At this point in time he tells me that he is tolerating the nintedanib fine.  He needs a liver function test.  However he has lost weight 160 pounds.  He does not know the reason.  He is tolerating nintedanib fine.  He is on Imuran for his rheumatoid arthritis.  In terms of his effort tolerance it is stable.  He is undergoing pulmonary rehabilitation and is using 3-4 L of oxygen with exertion.  He is still on the waiting list for a portable system.  He is willing to be seen by the transplant team for pulmonary fibrosis..    In terms of his rheumatoid arthritis he is on Imuran he is on a prednisone taper but still having pain.  He had her steroid injections.  He is not on Plaquenil.  I have encouraged him to talk to his rheumatologist about it.  Overall stable  Overall symptom score listed below.   OV 01/06/2020  Subjective:  Patient ID: Lawrence Wells, male , DOB: May 24, 1956 , age 61 y.o. 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  Started on nintedanib 10/12/2019  Associated emphysema present on CT scan January 2021  Normal cardiac stress test 10/14/2019  Rheumatoid arthritis on Imuran   01/06/2020 -     HPI Kalyb E Gettis 66 y.o. -there is a telephone visit.  Patient identified with 2 person identifier.  Wife also came on the phone.  He tells me in the wife also attest that since starting nintedanib he has continued to lose weight.  Today the weight is 159 pounds.  Diarrhea has started despite taking Imodium once a day.  The diarrhea is severe as documented by a level 4.  He is undergoing pulmonary rehabilitation and this is helped dyspnea but he is dealing with significant pain from the rheumatoid arthritis.  He is undergoing transplant evaluation at [facility] and is quite busy with that.  He has upcoming appointment with me in mid June 2021 for a face-to-face for pulmonary function test and follow-up.  But at this point in time symptoms of weight loss and diarrhea are paramount.     OV 03/07/2020   Subjective:  Patient ID: Lawrence Wells, male , DOB: Dec 08, 1955, age 2 y.o. years. 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 -presents for follow-up with his wife.   After his last visit he had COVID-19.  He was hospitalized.  I communicated with the hospitalist.  Since then he is recovered but is now left with a  worse than baseline.  He is now requiring 4 L nasal cannula with exertion even though this is an improvement is worse than his April 2021 baseline.  In terms of his rheumatoid arthritis he is on Imuran and prednisone.  This controls the pain.  However post Covid he developed some throat swelling.  Initially thought was sinus infection.  He saw primary care this then resulted in ENT evaluation.  Mid July 2020 when he has been diagnosed with sudden onset of right tonsillar cancer.  He has [employer] appointment pending.  Because of all this he is no longer a candidate for [facility] lung transplant services.  He is doing pulmonary rehabilitation.  Also because of all this he is lost significant amount of weight as documented below.  Is lost 13 pounds since April 2021.  He is asking for handicap placard.  Symptom scores are listed below.  Overall high symptom burden.  He and his wife are very frustrated by the turn of events.     OV 08/04/2020   Subjective:  Patient ID: Lawrence Wells, male , DOB: November 06, 1955, age 86 y.o. years. , MRN: [id number],  ADDRESS: [place] [address] PCP  Olin Hauser, DO Providers : Treatment Team:  Attending Provider: Brand Males, MD Patient Care Team: Olin Hauser, DO as PCP - General (Family Medicine)    Chief Complaint  Patient presents with   Follow-up    C/o sob with exertion.      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 -returns for follow-up of his pulmonary fibrosis.  He has had a terrible year of 2021 when he is been diagnosed with pulmonary fibrosis and rheumatoid arthritis and then had Covid and then in the aftermath of Covid had right tonsillar cancer.  When I last saw him he had to go to [facility] to get chemo and radiation.  He is here with his wife.  He tells me he is finished chemo and radiation.  He says he lost 50 pounds but has gained a lot of the pounds back.  He has a PEG tube.  Because of the PEG tube and dysphagia he is not been able to take Eliquis or nintedanib.  Despite this he feels that his pulmonary fibrosis is stable.  He does not use his oxygen much.  He says he is lucky to have survived so far.  He continues Imuran for rheumatoid arthritis.  His joints feel stable.  His foot drop is improved.  Of note in the last 4 days he has developed a black coated tongue.  He is in touch with his [place] about this.  Review of his symptom score and desaturation test suggest that his ILD continues to be stable.  He and his wife are wondering when to restart his nintedanib.  He says he is slowly beginning to swallow pills and food.  I have asked him to wait for his PET scan in January 2022 and get it cleared by his cancer and ENT physicians.       OV 12/13/2020  Subjective:  Patient ID: GOEBEL HELLUMS, male , DOB: Sep 07, 1955 , age 67 y.o. , MRN: [id number] , ADDRESS: [place] [address] PCP Olin Hauser, DO Patient Care Team: Olin Hauser, DO as PCP - General (Family Medicine)  This Provider for this visit: Treatment Team:  Attending Provider: Brand Males, MD     12/13/2020 -   Chief Complaint  Patient presents with   Follow-up    Pt states he has been doing well since last visit and denies any complaints. States he had his oxygen taken back by DME as he was not using  it.     HPI COLBURN ASPER 66 y.o. -returns for follow-up.  Last seen in December 2021.  At this point in time he is on Imuran for his rheumatoid arthritis which also serves for his connective tissue disease.  Since May of last he has not been able to take nintedanib because he continues to lose weight.  In the interim he did have tonsillar cancer and had dysphagia and could not actually swallow nintedanib.  His tonsillar cancer is now in remission since January 2022.  I reviewed the PET scan results at [facility] and copied and pasted below.  His PEG tube is off since February 2022.  He is eating well.  He is quite functional however he is continue to lose weight which he believes is because he is more active.  It is unintentional.  But he thinks that the healthy weight loss.  A few months ago he was 140s pounds in weight.  Now he is in his 130s.  He is not overly concerned about it but will monitor.  He is able to walk quite a bit and feels less short of breath.  Today when we walked him he did not desaturate but he refused oxygen.  His last PFT was in January 2021 last CT chest was in summer 2021.  At this point in time he is content doing the Imuran.  He does not want oxygen.  He also [and I agree with him] does not want nintedanib.    PFT  No flowsheet data found.  PET Scan Jan 2022  IMPRESSION:  No definite residual mass/tumor within the region of the right tonsil  extending along the mucosal surface into the right vallecula. No suspicious  lymphadenopathy is identified.   Electronically Reviewed by:  Renae Gloss, MD, [facility]  Electronically Reviewed on:  08/30/2020 3:33 PM     08/27/2021 Follow up ; RA related ILD, pneumonia Patient returns for 1 month follow-up.  Patient was seen last visit with a slow to resolve acute bronchitis plus or minus pneumonia.  Patient had been seen by his primary care provider.  RSV, COVID and flu swabs were negative.  Chest x-ray showed  increasing bibasilar lung opacities.  Suspicious for possible superimposed infection. Patient does have rheumatoid arthritis and is on Plaquenil and Imuran. Patient was recommended to begin Levaquin and a prednisone taper.  Since last visit patient is feeling much better.  Cough and congestion have decreased substantially.  Patient says he is back to baseline feeling good.  Patient says he remains very active at home.  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  He is followed at [facility].  PEG tube was removed in February 2022. Was previously on oxygen but no longer using. Last  used 10/2020. Sent back to DME>   Flu shot is utd.   Remains active at home , works in yard and garden. Hobbies . Very active  Has completed pulmonary rehab in past.  OV 01/17/2022  Subjective:  Patient ID: Lawrence Wells, male , DOB: 12-06-1955 , age 12 y.o. , MRN: [id number] , ADDRESS: [address] [place] [facility] [address] PCP Olin Hauser, DO Patient Care Team: Olin Hauser, DO as PCP - General (Family Medicine)  This Provider for this visit: Treatment Team:  Attending Provider: Brand Males, MD    01/17/2022 -   Chief Complaint  Patient presents with   Follow-up    PFT performed today.  Pt states he has been doing okay since last visit.      HPI DAWUD MAYS 66 y.o. -returns for follow-up.  He says he is doing well except for the fact that he still continues to be lean and underweight it.  He feels his cancer is under remission.  He is eating well.  He is quite active.  He is surprised that he is now off oxygen and doing well.  He is on Imuran and Plaquenil for his rheumatoid arthritis.  Is not on any antifibrotic.  He is surprised that he is alive and in fact doing well.  He is also  surprised that he is not on oxygen.  He is wondering if the chemotherapy for cancer actually help resolve his fibrosis [I could although listen to crackles and this pulm function test shows low DLCO].  He was supposed to have a high-resolution CT scan of the chest but our office sent the authorization to the wrong place.  Nevertheless he feels good.  His walking desaturation test is improved       OV 04/30/2022  Subjective:  Patient ID: Lawrence Wells, male , DOB: 1956-03-28 , age 66 y.o. 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  Pt states he has been coughing up some chunks of phlegm. States otherwise he has been doing okay since last visit.     HPI ROMOLO SIELING 66 y.o. -returns for follow-up.  His symptom  scores are stable.  He had pulmonary function test.  Compared to the summer 2023 today the FVC and DLCO are stable without change.  We did look at January 2021 pulmonary function test.  Paradoxically the DLCO is down while the FVC is up.  It is unclear why.  Overall he feels better.  He feels that the cancer chemotherapy really helped him.  Nevertheless on the latest high-resolution CT scan of the chest Dr. Rosario Jacks radiologist feels he has classic UIP pattern [personally visualized and agree] in addition she also feels it is progressive compared to 2021.  I went over this with him.  Did indicate to him UIP is a marker of progression in the future.  We discussed antifibrotic's.  Previously did not tolerate nintedanib.  Did explain to him that pirfenidone has similar GI side effect such as nausea weight loss anorexia and occasionally diarrhea.  However told him that some patients who did not tolerate nintedanib are able to tolerate pirfenidone.  Did indicate to him that given his low weight we would do the low-dose to minimize the risk of side effects but antifibrotic would be beneficial.  1 benefit with pirfenidone is that its not immunomodulating agent.  Based on all this we took a shared decision making to start pirfenidone at the lower dose of 2 pills 3 times daily.  Last blood work was in 2021 and we will repeat that today.  He has some grade 1 diastolic dysfunction and emphysema as well.  The pulmonary artery is enlarged on the CT scan but right ventricular function is normal on this echo     SYMPTOM SCALE - ILD 12/02/2019 On ofev, immuran, rehab  wegt 163# 159# 01/06/2020  03/07/2020 150#, ofev, immuran, pred 7.5 and 4L [place] iwt with exertion.  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 Not on ofev  01/17/2022 131# 04/30/2022 On immuran. Not on antifibrotic  O2 use ra  ra ra ra ra ra  Shortness of Breath 0 -> 5 scale with 5 being worst (score  6 If unable to do)        At rest 0  0 0 0 0 0  Simple tasks - showers, clothes change, eating, shaving 3  5 0 0 0 1  Household (dishes, doing bed, laundry) 3  5 0 0 1 1  Shopping 4  5 0 0 1 2  Walking level at own pace 3.'5  5 1 '$ 0 0 1  Walking up Stairs '4 4 6 1 1 1 2  '$ Total (30-36) Dyspnea Score '18  26 2 1 3 7  '$ How bad is your cough? '3 2 5 '$ 0 0 0 1  How bad is your fatigue '3  4 2 1 '$ 0 0  How bad is nausea 0  4 0 0 0 0  How bad is vomiting?  0  0 0 0 0 0  How bad is diarrhea? 0 4 despite immodium 1 per day 2 0 0 0 0  How bad is anxiety? '2  2 5 '$ 0 0 0  How bad is depression 2  3 0 0 0 0  pain  2 0  0 0 0        Simple office walk 185 feet x  3 laps goal with forehead probe 09/10/2019  10/22/2019  .03/07/2020   08/04/2020  12/13/2020  01/17/2022  04/30/2022   O2 used ra  ra - walk, uses 2L portable at home with exerion ra ra  ra ra  Number laps completed '3 3 3 '$ attempted but did only 1/2 Did all 3  Did all 3 laps   Comments about pace mod     Good pace   Resting Pulse Ox/HR 98% and 74/min 95% and 71 99% and 85/min 95% and HR 91/min 99% and HR 63 100% and 56/min   Final Pulse Ox/HR 88% and 96/min 86% and 101 88% a and 97/mi 89% and 107% 86% and HR 81 96% and 72 bpm   Desaturated </= 88% yes yes       Desaturated <= 3% points Yes, 10 points Yes, 9 pon       Got Tachycardic >/= 90/min yes yes       Symptoms at end of test Yes "pretty heavy" yes       Miscellaneous comments Corrected 2L   Corrected with 4L [place]  Refused o3 Improved      PFT     Latest Ref Rng & Units Jan 2021 at [facility] 04/30/2022    9:04 AM 01/17/2022    3:41 PM  PFT Results  FVC-Pre L 3.63 4.01  P 4.08   FVC-Predicted Pre %  89  P 86   Pre FEV1/FVC % %  81  P 82   FEV1-Pre L  3.23  P 3.33   FEV1-Predicted Pre %  97  P 95   DLCO uncorrected ml/min/mmHg 15.6 10.43  P 9.05   DLCO UNC% %  39  P 33   DLCO corrected ml/min/mmHg  10.62  P 9.24   DLCO COR %Predicted %  40  P 33   DLVA Predicted %  49  P 41     P  Preliminary result    Hrct June 2023  IMPRESSION: 1. Pulmonary parenchymal pattern of fibrosis, as described above, progressive from 01/20/2020. Findings are consistent with UIP per consensus guidelines: Diagnosis of Idiopathic Pulmonary Fibrosis: An Official ATS/ERS/JRS/ALAT Clinical Practice Guideline. [place], Iss 5, [id number], Apr 19 2017. 2. No evidence of metastatic disease. 3. Aortic atherosclerosis (ICD10-I70.0). Coronary artery calcification. 4. Enlarged pulmonic trunk, indicative of pulmonary arterial hypertension. 5.  Emphysema (ICD10-J43.9).     Electronically Signed   By: Lorin Picket M.D.   On: 01/21/2022 08:09  Echo 04/24/22  IMPRESSIONS     1. Left ventricular ejection fraction, by estimation, is 55 to 60%. The  left ventricle has normal function. The left ventricle has no regional  wall motion abnormalities. Left ventricular diastolic parameters are  consistent with Grade I diastolic  dysfunction (impaired relaxation).   2. Right ventricular systolic function is low normal. The right  ventricular size is normal.   3. The mitral valve is normal in structure. Mild mitral valve  regurgitation.   4. The aortic valve is tricuspid. Aortic valve regurgitation is not  visualized. Aortic valve sclerosis/calcification is present, without any  evidence of aortic stenosis.   5. The inferior vena cava is normal in size with greater than 50%  respiratory variability, suggesting right atrial pressure of 3 mmHg.    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   reports that he quit smoking about 16 years ago. His smoking use included cigarettes. He has a 45.00 pack-year smoking history. He quit smokeless  tobacco use about 16 years ago.  His smokeless tobacco use  included chew.  Past Surgical History:  Procedure Laterality Date   SINUS SURGERY WITH INSTATRAK      Allergies  Allergen Reactions   Atorvastatin Other (See Comments)    Myalgias    Immunization History  Administered Date(s) Administered   Fluad Quad(high Dose 65+) 08/10/2021, 04/30/2022   Influenza, High Dose Seasonal PF 07/28/2020   PFIZER(Purple Top)SARS-COV-2 Vaccination 11/22/2019, 12/15/2019   Pneumococcal Conjugate-13 09/08/2019   Tdap 04/19/2015, 05/07/2017   Zoster Recombinat (Shingrix) 09/29/2019, 11/27/2019    Family History  Problem Relation Age of Onset   Heart disease Mother    Heart attack Mother 48   Arthritis Mother    Heart disease Father    Heart attack Father 84   Healthy Sister    Hyperlipidemia Brother    Throat cancer Brother 63   Heart disease Brother    Healthy Son    Healthy Daughter      Current Outpatient Medications:    azaTHIOprine (IMURAN) 50 MG tablet, Take 2 tablets (100 mg total) by mouth daily., Disp: 180 tablet, Rfl: 0   ezetimibe (ZETIA) 10 MG tablet, Take 1 tablet (10 mg total) by mouth daily., Disp: 90 tablet, Rfl: 3   hydroxychloroquine (PLAQUENIL) 200 MG tablet, Take 1 tablet by mouth daily., Disp: , Rfl:    meclizine (ANTIVERT) 25 MG tablet, Take 1 tablet (25 mg total) by mouth 3 (three) times daily as needed for dizziness., Disp: 30 tablet, Rfl: 2   Multiple Vitamins-Minerals (CENTRUM SILVER 50+MEN PO), Take by mouth daily., Disp: , Rfl:    rizatriptan (MAXALT-MLT) 10 MG disintegrating tablet, Take 1 tablet (10 mg total) by mouth as needed for migraine. May repeat in 2 hours if needed, Disp: 10 tablet, Rfl: 0   tobramycin-dexamethasone (TOBRADEX) ophthalmic solution, Place into the left eye., Disp: , Rfl:       Objective:   Vitals:   04/30/22 1130  BP: 108/64  Pulse: 68  SpO2: 98%  Weight: 128 lb 9.6 oz (58.3 kg)  Height: '5\' 10"'$  (1.778 m)    Estimated body mass index is 18.45 kg/m as calculated from the  following:   Height as of this encounter: '5\' 10"'$  (1.778 m).   Weight as of this encounter: 128 lb 9.6 oz (58.3 kg).  '@WEIGHTCHANGE'$ @  [employer]   04/30/22 1130  Weight: 128 lb 9.6 oz (58.3 kg)     Physical Exam General: No distress. Looks well Neuro: Alert and Oriented x 3. GCS 15. Speech normal Psych: Pleasant Resp:  Barrel Chest - no.  Wheeze - no, Crackles - some yes, No overt respiratory distress CVS: Normal heart sounds. Murmurs - no Ext: Stigmata of Connective Tissue Disease - no HEENT: Normal upper airway. PEERL +. No post nasal drip        Assessment:       ICD-10-CM   1. ILD (interstitial lung disease) (HCC)  J84.9 Brain natriuretic peptide    CBC with Differential/Platelet    Hepatic function panel    Hepatic function panel    CBC with Differential/Platelet    Brain natriuretic peptide    2. Need for immunization against influenza  Z23 Flu Vaccine QUAD High Dose(Fluad)         Plan:     Patient Instructions     ICD-10-CM   1. Interstitial lung disease due to connective tissue disease (HCC)  J84.89    M35.9  2. Rheumatoid arthritis involving multiple sites with positive rheumatoid factor (HCC)  M05.79     3. Pulmonary emphysema, unspecified emphysema type ([place])  J43.9       Disease slowly progressive on CT chest over 2 years though clinically you are stable and stable on PFT June -> Sept 2023  The UIP pattern of fibrosis marks future risk for progression as well  Prior intolerance to ofev  Mild heart muschle stiffness and emphysema also contributing to shortness of breath  Plan - check bmet, cbc, lft 04/30/2022 - start pirfenidone/esbriet per protocol  - max is low dose of 2 pills three times daily  - take 5-6h apart with food, appply sunscreen and drink water a lot - high dose flu shot 04/30/2022 - RSV vaccine and covid mRNA in fall 2023 - consider spiriva at followup   Followup  - 6 weeks  - 8 weeks with APP (can bein BRL ) for  esbiret uptake - 12-16 weeks with DR Chase Caller;  Dr Chase Caller - onsite visit - 30 min   - symptoms score and walk test at followuo   High complex condition needing inintesive therapeutic monitoring SIGNATURE    Dr. Brand Males, M.D., F.C.C.P,  Pulmonary and Critical Care Medicine Staff Physician, [place] Director - Interstitial Lung Disease  Program  [facility] [place], [place], [address]  Pager: [phone], If no answer or between  15:00h - 7:00h: call [phone] Telephone: [phone]  12:15 PM 04/30/2022

## 2022-04-30 NOTE — Patient Instructions (Signed)
Spirometry and DLCO Performed Today.  

## 2022-05-02 ENCOUNTER — Other Ambulatory Visit (HOSPITAL_COMMUNITY): Payer: Self-pay

## 2022-05-02 DIAGNOSIS — R1314 Dysphagia, pharyngoesophageal phase: Secondary | ICD-10-CM | POA: Diagnosis not present

## 2022-05-02 DIAGNOSIS — H903 Sensorineural hearing loss, bilateral: Secondary | ICD-10-CM | POA: Diagnosis not present

## 2022-05-02 DIAGNOSIS — B379 Candidiasis, unspecified: Secondary | ICD-10-CM | POA: Diagnosis not present

## 2022-05-02 DIAGNOSIS — H9123 Sudden idiopathic hearing loss, bilateral: Secondary | ICD-10-CM | POA: Diagnosis not present

## 2022-05-02 NOTE — Telephone Encounter (Signed)
Called [employer] for prior authorization for pirfenidone since prior authorization did not populate clinical questions on CMM.  Phone:  [phone]  Per rep, prior authorization is approved from 08/19/21 to 08/18/22. Rep will fax approval letter  Authorization # [id number]  Test claim for 30 day supply of maintenance dose is $10. Patient can fill through [facility].  Knox Saliva, PharmD, MPH, BCPS, CPP Clinical Pharmacist (Rheumatology and Pulmonology)

## 2022-05-08 ENCOUNTER — Other Ambulatory Visit (HOSPITAL_COMMUNITY): Payer: Self-pay

## 2022-05-08 ENCOUNTER — Telehealth: Payer: Self-pay

## 2022-05-08 DIAGNOSIS — J849 Interstitial pulmonary disease, unspecified: Secondary | ICD-10-CM

## 2022-05-08 DIAGNOSIS — Z5181 Encounter for therapeutic drug level monitoring: Secondary | ICD-10-CM

## 2022-05-08 MED ORDER — PIRFENIDONE 267 MG PO TABS
ORAL_TABLET | ORAL | 0 refills | Status: DC
  Filled 2022-05-08: qty 159, fill #0
  Filled 2022-05-10: qty 159, 30d supply, fill #0

## 2022-05-08 MED ORDER — PIRFENIDONE 267 MG PO TABS
534.0000 mg | ORAL_TABLET | Freq: Three times a day (TID) | ORAL | 4 refills | Status: DC
  Filled 2022-05-08 – 2022-05-30 (×2): qty 180, 30d supply, fill #0
  Filled 2022-06-25: qty 180, 30d supply, fill #1

## 2022-05-08 NOTE — Telephone Encounter (Signed)
Subjective:  Patient called today by [facility] Pulmonary pharmacy team for Esbriet new start.   Patient was last seen by Dr. Chase Caller on 9/12.  Pertinent past medical history includes Afib, COPD, oropharynx cancer, ILD with connective tissue disease, rheumatoid arthritis. Prior therapy includes Ofev- previously discontinued because of weight loss. He emphasized that the weight loss occurred after his cancer diagnosis and requirement of a feeding tube.   History of elevated LFTs: No History of diarrhea, nausea, vomiting: No  Objective: Allergies  Allergen Reactions   Atorvastatin Other (See Comments)    Myalgias    Outpatient Encounter Medications as of 05/08/2022  Medication Sig   azaTHIOprine (IMURAN) 50 MG tablet Take 2 tablets (100 mg total) by mouth daily.   ezetimibe (ZETIA) 10 MG tablet Take 1 tablet (10 mg total) by mouth daily.   hydroxychloroquine (PLAQUENIL) 200 MG tablet Take 1 tablet by mouth daily.   meclizine (ANTIVERT) 25 MG tablet Take 1 tablet (25 mg total) by mouth 3 (three) times daily as needed for dizziness.   Multiple Vitamins-Minerals (CENTRUM SILVER 50+MEN PO) Take by mouth daily.   rizatriptan (MAXALT-MLT) 10 MG disintegrating tablet Take 1 tablet (10 mg total) by mouth as needed for migraine. May repeat in 2 hours if needed   tobramycin-dexamethasone [facility]) ophthalmic solution Place into the left eye.   No facility-administered encounter medications on file as of 05/08/2022.     Immunization History  Administered Date(s) Administered   Fluad Quad(high Dose 65+) 08/10/2021, 04/30/2022   Influenza, High Dose Seasonal PF 07/28/2020   PFIZER(Purple Top)SARS-COV-2 Vaccination 11/22/2019, 12/15/2019   Pneumococcal Conjugate-13 09/08/2019   Tdap 04/19/2015, 05/07/2017   Zoster Recombinat (Shingrix) 09/29/2019, 11/27/2019      PFT's No results found for: "FEV1", "FVC", "FEV1FVC", "TLC", "DLCO"    CMP     Component Value Date/Time   NA 135 02/08/2020  1645   K 4.7 03/27/2020 0931   CL 100 02/08/2020 1645   CO2 29 02/08/2020 1645   GLUCOSE 105 (H) 02/08/2020 1645   BUN 32 (H) 02/08/2020 1645   CREATININE 0.76 02/08/2020 1645   CREATININE 0.84 11/18/2019 1030   CALCIUM 9.5 02/08/2020 1645   PROT 7.0 04/30/2022 1215   ALBUMIN 4.1 04/30/2022 1215   AST 21 04/30/2022 1215   ALT 14 04/30/2022 1215   ALKPHOS 62 04/30/2022 1215   BILITOT 0.3 04/30/2022 1215   GFRNONAA >60 01/23/2020 0202   GFRNONAA 92 11/18/2019 1030   GFRAA >60 01/23/2020 0202   GFRAA 106 11/18/2019 1030      CBC    Component Value Date/Time   WBC 6.4 04/30/2022 1215   RBC 4.16 (L) 04/30/2022 1215   HGB 14.3 04/30/2022 1215   HCT 42.5 04/30/2022 1215   PLT 254.0 04/30/2022 1215   MCV 102.2 (H) 04/30/2022 1215   MCH 31.9 01/23/2020 0202   MCHC 33.7 04/30/2022 1215   RDW 14.4 04/30/2022 1215   LYMPHSABS 0.7 04/30/2022 1215   MONOABS 0.9 04/30/2022 1215   EOSABS 0.1 04/30/2022 1215   BASOSABS 0.1 04/30/2022 1215      LFT's    Latest Ref Rng & Units 04/30/2022   12:15 PM 02/08/2020    4:45 PM 01/23/2020    2:02 AM  Hepatic Function  Total Protein 6.0 - 8.3 g/dL 7.0  6.9  5.8   Albumin 3.5 - 5.2 g/dL 4.1  3.9  2.6   AST 0 - 37 U/L 21  21  33   ALT 0 -  53 U/L 14  17  41   Alk Phosphatase 39 - 117 U/L 62  83  78   Total Bilirubin 0.2 - 1.2 mg/dL 0.3  0.3  0.2   Bilirubin, Direct 0.0 - 0.3 mg/dL 0.1         HRCT (01/18/22): Pulmonary parenchymal pattern of fibrosis, as described above, progressive from 01/20/2020. Findings are consistent with UIP per consensus guidelines.  Assessment and Plan  Esbriet Medication Management Thoroughly counseled patient on the efficacy, mechanism of action, dosing, administration, adverse effects, and monitoring parameters of Esbriet.  Patient verbalized understanding.   Goals of Therapy: Will not stop or reverse the progression of ILD. It will slow the progression of ILD.   Dosing: Starting dose will be Esbriet 267 mg  1 tablet three times daily for 7 days, then 2 tablets three times daily as maintenance.    Patient to stay on low dose Esbriet per Dr. Chase Caller. Stressed the importance of taking with meals and space at least 5-6 hours apart to minimize stomach upset.   Adverse Effects: Nausea, vomiting, diarrhea, weight loss Abdominal pain GERD Sun sensitivity/rash - patient advised to wear sunscreen when exposed to sunlight Dizziness Fatigue  Monitoring: Monitor for diarrhea, nausea and vomiting, GI perforation, hepatotoxicity  Monitor LFTs - baseline, monthly for first 6 months, then every 3 months routinely CBC w differential at baseline and every 3 months routinely  Access: Approval of Esbriet through: insurance Rx sent to: [place]: [phone]   Medication Reconciliation A drug regimen assessment was performed, including review of allergies, interactions, disease-state management, dosing and immunization history. Medications were reviewed with the patient, including name, instructions, indication, goals of therapy, potential side effects, importance of adherence, and safe use.  Immunizations Patient is indicated for the pneumonia vaccination. Patient has received 2 COVID19 vaccines.  Thank you for involving pharmacy to assist in providing this patient's care.    Maryan Puls, PharmD PGY-1 [facility] Pharmacy Resident

## 2022-05-10 ENCOUNTER — Other Ambulatory Visit (HOSPITAL_COMMUNITY): Payer: Self-pay

## 2022-05-10 ENCOUNTER — Ambulatory Visit: Payer: Medicare PPO | Attending: Otolaryngology | Admitting: Speech Pathology

## 2022-05-10 DIAGNOSIS — C099 Malignant neoplasm of tonsil, unspecified: Secondary | ICD-10-CM | POA: Insufficient documentation

## 2022-05-10 DIAGNOSIS — R1313 Dysphagia, pharyngeal phase: Secondary | ICD-10-CM | POA: Insufficient documentation

## 2022-05-10 NOTE — Telephone Encounter (Signed)
Delivery instructions have been updated in [facility], medication will be shipped to patient's home address by 05/15/22.  Rx has been processed in [facility] and there is a copay of $10.00. Payment information has been collected and forwarded to the pharmacy.

## 2022-05-13 ENCOUNTER — Ambulatory Visit: Payer: Medicare PPO | Admitting: Speech Pathology

## 2022-05-13 DIAGNOSIS — R1313 Dysphagia, pharyngeal phase: Secondary | ICD-10-CM

## 2022-05-13 DIAGNOSIS — C099 Malignant neoplasm of tonsil, unspecified: Secondary | ICD-10-CM | POA: Diagnosis not present

## 2022-05-14 ENCOUNTER — Encounter: Payer: Self-pay | Admitting: Speech Pathology

## 2022-05-14 NOTE — Therapy (Signed)
OUTPATIENT SPEECH LANGUAGE PATHOLOGY SWALLOW EVALUATION   Patient Name: Lawrence Wells MRN: [id number] DOB:[birth year], 66 y.o., male Today's Date: 05/10/2022  PCP: Nobie Putnam, DO REFERRING PROVIDER: Carloyn Manner, MD   End of Session - 05/10/22 2048     Visit Number 1    Number of Visits 25    Date for SLP Re-Evaluation 08/02/22    Authorization Type [employer] Medicare Choice PPO    Progress Note Due on Visit 10    SLP Start Time 0900    SLP Stop Time  1000    SLP Time Calculation (min) 60 min    Activity Tolerance Patient tolerated treatment well             Past Medical History:  Diagnosis Date   Arthritis    Collagen vascular disease ([place])    Rhematoid Arthritis   COPD (chronic obstructive pulmonary disease) ([place])    Coronary artery disease    Dyspnea    Emphysema lung ([place])    Hyperlipidemia    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[place])    Past Surgical History:  Procedure Laterality Date   SINUS SURGERY WITH INSTATRAK     Patient Active Problem List   Diagnosis Date Noted   COPD with acute exacerbation ([place]) 08/02/2021   CAP (community acquired pneumonia) 08/02/2021   Encounter for follow-up examination after completed treatment for malignant neoplasm 06/05/2021   History of radiation to head and neck region 06/05/2021   Xerostomia 06/05/2021   Receives feedings through gastrostomy ([place]) 06/09/2020   Severe protein-calorie malnutrition ([place]) 05/20/2020   Mucositis due to radiation therapy 05/18/2020   CINV (chemotherapy-induced nausea and vomiting) 05/12/2020   Lymphadenopathy of head and neck 04/20/2020   Neoplasm related pain 04/20/2020   Thrush 04/20/2020   Abnormal weight loss 04/17/2020   Encounter for antineoplastic chemotherapy 04/12/2020   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   Atrial fibrillation ([place]) 02/24/2020   Foot drop 02/15/2020   Aortic atherosclerosis ([place]) 02/10/2020    Centrilobular emphysema ([place]) 02/09/2020   Raynaud's phenomenon without gangrene 02/02/2020   Encounter for long-term (current) use of high-risk medication 02/02/2020   Encounter for pre-transplant evaluation for lung transplant 01/13/2020   Chronic respiratory failure with hypoxia ([place]) 01/10/2020   On home oxygen therapy 01/10/2020   Organ transplant candidate 01/10/2020   Pulmonary nodule, left 01/10/2020   Rheumatoid arthritis involving multiple sites with positive rheumatoid factor ([place]) 12/28/2019   ILD (interstitial lung disease) ([place]) 12/28/2019   Coronary artery calcification 09/26/2019   Pulmonary fibrosis ([place]) 09/08/2019   Myalgia due to statin 09/08/2019   IPF (idiopathic pulmonary fibrosis) ([place]) 09/08/2019   Chronic hip pain, bilateral 06/09/2017   Chronic bilateral low back pain with bilateral sciatica 06/09/2017   Osteoarthritis of multiple joints 05/07/2017   Genital herpes 05/07/2017   Screening for prostate cancer 05/07/2017   Hypercholesterolemia 03/30/2003   Other postprocedural status(V45.89) 03/30/2003   Chest pain 03/30/2003   Former smoker 02/26/1995   Tobacco use disorder 02/26/1995    ONSET DATE: 04/19/2022   REFERRING DIAG: Dysphagia  THERAPY DIAG:  Dysphagia, pharyngeal phase  Right tonsillar squamous cell carcinoma (HCC)  Rationale for Evaluation and Treatment Rehabilitation  SUBJECTIVE:   SUBJECTIVE STATEMENT: Pt pleasant, good historian, 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 Pt with right tonsillar squamous cell carcinoma. Completed last dose of Cisplatin IV on 05/26/2020 annd 70Gy radiation treatment on 05/28/2020. Pt with previous Modified Barium Swallow Studies on 10/04/2020 with no penetration or aspiration observed and a second  Modified Barium Swallow on 04/19/2022 that revealed 1 instance of penetration. However more concerning was moderate valleuclar residue when consuming puree and soft solids. Pt unable to clear residue and hocks it up. Currently pt is on a liquid diet d/t these pharyngeal impairments. Recommend Outpatient ST services to target hyolaryngeal movement and pharyngeal strengthening for diet advancement.    PAIN:  Are you having pain? No  FALLS: Has patient fallen in last 6 months?  No  LIVING ENVIRONMENT: Lives with: lives with their family and lives with their spouse Lives in: House/apartment  PLOF:  Level of assistance: Independent with ADLs, Independent with IADLs Employment: Retired   PATIENT GOALS to be able to eat solids  OBJECTIVE:    RECOMMENDATIONS FROM OBJECTIVE SWALLOW STUDY (MBSS/FEES):  04/19/2022 Objective swallow impairments: pharyngeal Objective recommended compensations: hock and re-swallow  COGNITION: Overall cognitive status: Within functional limits for tasks assessed   ORAL MOTOR EXAMINATION Facial : WFL Lingual: WFL Velum: WFL Mandible: WFL Cough: WFL Voice: Wet   CLINICAL SWALLOW ASSESSMENT:   Current diet: thin liquids Dentition: adequate natural dentition Feeding: able to feed self Consistencies tested: Thin Liquid: Presentation: Straw Oral Phase: WFL Pharyngeal Phase: WFL Puree: Presentation: Spoon Oral Phase: WFL Pharyngeal Phase: Impaired: decreased hyolaryngeal movement to palpation and complaints of residue   Evaluation findings: Patient presents with s/sx of (oral, pharyngeal, oropharyngeal) dysphagia characterized by decreased hyolaryngeal movements.   Aspiration risk factors:History of dysphagia and Other: radiation for HNC Overall aspiration risk:Minimal Diet Recommendations: thin liquids Precautions:Minimize environmental distractions, Slow rate, Small sips/bites, Seated upright 90 degrees, and Remain upright for at least 30 minutes  after meals Supervision: Patient able to feed self Oral care recommendations:Oral care BID Follow-up recommendations: Therapy as outlined in treatment plan below     TODAY'S TREATMENT:  SLP provided instruction in Masko and pitch glides. With minimal assistance faded to Mod I, pt was able to complete 1 set of 10 Masko and with moderate faded to minimal assistance, pt able to produce 1 set of 10 pitch glides (low to high) using vowel /e/.    PATIENT EDUCATION: Education details: habitual throat clears and pharyngeal strengthen exercises Person educated: Patient Education method: Explanation, Demonstration, Verbal cues, and Handouts Education comprehension: verbalized understanding and needs further education    GOALS: Goals reviewed with patient? Yes  SHORT TERM GOALS: Target date: 10 sessions  Pt will complete pharyngeal strengthening exercises.  Baseline: new goal Goal status: INITIAL   LONG TERM GOALS: Target date: 07/23/2022 Pt will consume least restrictive diet with minimal report of pharyngeal residue as evidenced by decreased hocking up of soft solids and regular solids.  Baseline: unable to swallow soft solids Goal status: INITIAL  ASSESSMENT:  CLINICAL IMPRESSION: Patient is a 66 y.o. male who was seen today for a clinical swallow evaluation. Pt continues to present with moderate pharyngeal phase dysphagia and his current diet consists only of thin liquids. Pt is substantially underweight as a result.   OBJECTIVE IMPAIRMENTS include dysphagia. These impairments are limiting patient from safety when swallowing. Factors affecting potential to achieve goals and functional outcome are hx of radiation to throat. Patient will benefit from skilled SLP services to address above impairments and improve overall function.  REHAB POTENTIAL: Good   PLAN: SLP FREQUENCY: 1-2x/week  SLP DURATION: 12 weeks  PLANNED INTERVENTIONS: Pharyngeal strengthening exercises, SLP  instruction and feedback, and Patient/family education   Chriselda Leppert B. Rutherford Nail, M.S., CCC-SLP, Mining engineer Certified Brain Injury [place]  [place] Office [phone] Ascom [phone] Fax [phone]

## 2022-05-14 NOTE — Therapy (Signed)
OUTPATIENT SPEECH LANGUAGE PATHOLOGY TREATMENT NOTE   Patient Name: Lawrence Wells MRN: [id number] DOB:28-Apr-1956, 66 y.o., male Today's Date: 05/14/2022  PCP: Nobie Putnam, DO REFERRING PROVIDER: Carloyn Manner, MD  END OF SESSION:   End of Session - 05/14/22 2055     Visit Number 2    Number of Visits 25    Date for SLP Re-Evaluation 08/02/22    Authorization Type [employer] Medicare Choice PPO    Progress Note Due on Visit 10    SLP Start Time 0800    SLP Stop Time  0830    SLP Time Calculation (min) 30 min    Activity Tolerance Patient tolerated treatment well             Past Medical History:  Diagnosis Date   Arthritis    Collagen vascular disease ([place])    Rhematoid Arthritis   COPD (chronic obstructive pulmonary disease) ([place])    Coronary artery disease    Dyspnea    Emphysema lung ([place])    Hyperlipidemia    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[place])    Past Surgical History:  Procedure Laterality Date   SINUS SURGERY WITH INSTATRAK     Patient Active Problem List   Diagnosis Date Noted   COPD with acute exacerbation ([place]) 08/02/2021   CAP (community acquired pneumonia) 08/02/2021   Encounter for follow-up examination after completed treatment for malignant neoplasm 06/05/2021   History of radiation to head and neck region 06/05/2021   Xerostomia 06/05/2021   Receives feedings through gastrostomy ([place]) 06/09/2020   Severe protein-calorie malnutrition ([place]) 05/20/2020   Mucositis due to radiation therapy 05/18/2020   CINV (chemotherapy-induced nausea and vomiting) 05/12/2020   Lymphadenopathy of head and neck 04/20/2020   Neoplasm related pain 04/20/2020   Thrush 04/20/2020   Abnormal weight loss 04/17/2020   Encounter for antineoplastic chemotherapy 04/12/2020   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   Atrial fibrillation ([place]) 02/24/2020   Foot drop 02/15/2020   Aortic atherosclerosis ([place])  02/10/2020   Centrilobular emphysema ([place]) 02/09/2020   Raynaud's phenomenon without gangrene 02/02/2020   Encounter for long-term (current) use of high-risk medication 02/02/2020   Encounter for pre-transplant evaluation for lung transplant 01/13/2020   Chronic respiratory failure with hypoxia ([place]) 01/10/2020   On home oxygen therapy 01/10/2020   Organ transplant candidate 01/10/2020   Pulmonary nodule, left 01/10/2020   Rheumatoid arthritis involving multiple sites with positive rheumatoid factor ([place]) 12/28/2019   ILD (interstitial lung disease) ([place]) 12/28/2019   Coronary artery calcification 09/26/2019   Pulmonary fibrosis ([place]) 09/08/2019   Myalgia due to statin 09/08/2019   IPF (idiopathic pulmonary fibrosis) ([place]) 09/08/2019   Chronic hip pain, bilateral 06/09/2017   Chronic bilateral low back pain with bilateral sciatica 06/09/2017   Osteoarthritis of multiple joints 05/07/2017   Genital herpes 05/07/2017   Screening for prostate cancer 05/07/2017   Hypercholesterolemia 03/30/2003   Other postprocedural status(V45.89) 03/30/2003   Chest pain 03/30/2003   Former smoker 02/26/1995   Tobacco use disorder 02/26/1995    ONSET DATE: 04/19/2022  REFERRING DIAG: R13.10 (ICD-10-CM) - Dysphagia, unspecified   PERTINENT HISTORY/DIAGNOSTIC FINDINGS: Pt is a 66 year old male who is s/p Pt is a 66 year old male who was referred by his ENT, Dr Carloyn Manner, d/t concerns with pharyngeal dysphagia. Pt with right tonsillar squamous cell carcinoma. Completed last dose of Cisplatin IV on 05/26/2020 annd 70Gy radiation treatment on 05/28/2020. Pt with previous Modified Barium  Swallow Studies on 10/04/2020 with no penetration or aspiration observed and a second Modified Barium Swallow on 04/19/2022 that revealed 1 instance of penetration. However more concerning was moderate valleuclar residue when consuming puree and soft solids. Pt unable to clear residue and hocks it up. Currently pt is on  a liquid diet d/t these pharyngeal impairments. Recommend Outpatient ST services to target hyolaryngeal movement and pharyngeal strengthening for diet advancement.   THERAPY DIAG:  Dysphagia, pharyngeal phase  Right tonsillar squamous cell carcinoma (HCC)  Rationale for Evaluation and Treatment Rehabilitation  SUBJECTIVE: Pt's vocal quality appears improved, states he has been practicing- treatment was only 30 minutes as pt's wife called and stated she was being sent to the ED  Pt accompanied by: self  PAIN:  Are you having pain? No  PATIENT GOALS: to be able to eat solids  OBJECTIVE:   TODAY'S TREATMENT: Skilled treatment session focused on pt's dysphagia goals, specifically pharyngeal strengthening. SLP facilitated the session by providing the following interventions:  Pt was Mod I for 1 set of 10 Masako Introduced and instructed pt in the [facility]. Assistance able to fade from minimal to supervision level.    PATIENT EDUCATION: Education details: pharyngeal exercises Person educated: Patient Education method: Explanation, Demonstration, and Handouts Education comprehension: verbalized understanding and returned demonstration  GOALS: Goals reviewed with patient? Yes   SHORT TERM GOALS: Target date: 10 sessions   Pt will complete pharyngeal strengthening exercises.  Baseline: new goal Goal status: INITIAL     LONG TERM GOALS: Target date: 07/23/2022 Pt will consume least restrictive diet with minimal report of pharyngeal residue as evidenced by decreased hocking up of soft solids and regular solids.  Baseline: unable to swallow soft solids Goal status: INITIAL ASSESSMENT:  CLINICAL IMPRESSION: Pt presents with increased ability to perform pharyngeal strengthening exercises.   OBJECTIVE IMPAIRMENTS include dysphagia. These impairments are limiting patient from safety when swallowing. Factors affecting potential to achieve goals and functional outcome are   radiation of pharyngeal muscles . Patient will benefit from skilled SLP services to address above impairments and improve overall function.  REHAB POTENTIAL: Good  PLAN: SLP FREQUENCY: 1-2x/week  SLP DURATION: 12 weeks  PLANNED INTERVENTIONS: Aspiration precaution training, Pharyngeal strengthening exercises, Diet toleration management , Trials of upgraded texture/liquids, SLP instruction and feedback, and Patient/family education   Kista Robb B. Rutherford Nail, M.S., CCC-SLP, Mining engineer Certified Brain Injury [place]  [place] Office [phone] Ascom [phone] Fax [phone]

## 2022-05-17 ENCOUNTER — Ambulatory Visit: Payer: Medicare PPO | Admitting: Speech Pathology

## 2022-05-17 DIAGNOSIS — R1313 Dysphagia, pharyngeal phase: Secondary | ICD-10-CM | POA: Diagnosis not present

## 2022-05-17 DIAGNOSIS — C099 Malignant neoplasm of tonsil, unspecified: Secondary | ICD-10-CM | POA: Diagnosis not present

## 2022-05-17 NOTE — Therapy (Signed)
OUTPATIENT SPEECH LANGUAGE PATHOLOGY TREATMENT NOTE   Patient Name: CAPRI RABEN MRN: [id number] DOB:[birth year], 66 y.o., male Today's Date: 05/17/2022  PCP: Nobie Putnam, DO REFERRING PROVIDER: Carloyn Manner, MD  END OF SESSION:   End of Session - 05/17/22 0845     Visit Number 3    Number of Visits 25    Date for SLP Re-Evaluation 08/02/22    Authorization Type [employer] Medicare Choice PPO    Progress Note Due on Visit 10    SLP Start Time 0800    SLP Stop Time  0845    SLP Time Calculation (min) 45 min    Activity Tolerance Patient tolerated treatment well             Past Medical History:  Diagnosis Date   Arthritis    Collagen vascular disease ([place])    Rhematoid Arthritis   COPD (chronic obstructive pulmonary disease) ([place])    Coronary artery disease    Dyspnea    Emphysema lung ([place])    Hyperlipidemia    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[place])    Past Surgical History:  Procedure Laterality Date   SINUS SURGERY WITH INSTATRAK     Patient Active Problem List   Diagnosis Date Noted   COPD with acute exacerbation ([place]) 08/02/2021   CAP (community acquired pneumonia) 08/02/2021   Encounter for follow-up examination after completed treatment for malignant neoplasm 06/05/2021   History of radiation to head and neck region 06/05/2021   Xerostomia 06/05/2021   Receives feedings through gastrostomy ([place]) 06/09/2020   Severe protein-calorie malnutrition ([place]) 05/20/2020   Mucositis due to radiation therapy 05/18/2020   CINV (chemotherapy-induced nausea and vomiting) 05/12/2020   Lymphadenopathy of head and neck 04/20/2020   Neoplasm related pain 04/20/2020   Thrush 04/20/2020   Abnormal weight loss 04/17/2020   Encounter for antineoplastic chemotherapy 04/12/2020   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   Atrial fibrillation ([place]) 02/24/2020   Foot drop 02/15/2020   Aortic atherosclerosis ([place])  02/10/2020   Centrilobular emphysema ([place]) 02/09/2020   Raynaud's phenomenon without gangrene 02/02/2020   Encounter for long-term (current) use of high-risk medication 02/02/2020   Encounter for pre-transplant evaluation for lung transplant 01/13/2020   Chronic respiratory failure with hypoxia ([place]) 01/10/2020   On home oxygen therapy 01/10/2020   Organ transplant candidate 01/10/2020   Pulmonary nodule, left 01/10/2020   Rheumatoid arthritis involving multiple sites with positive rheumatoid factor ([place]) 12/28/2019   ILD (interstitial lung disease) ([place]) 12/28/2019   Coronary artery calcification 09/26/2019   Pulmonary fibrosis ([place]) 09/08/2019   Myalgia due to statin 09/08/2019   IPF (idiopathic pulmonary fibrosis) ([place]) 09/08/2019   Chronic hip pain, bilateral 06/09/2017   Chronic bilateral low back pain with bilateral sciatica 06/09/2017   Osteoarthritis of multiple joints 05/07/2017   Genital herpes 05/07/2017   Screening for prostate cancer 05/07/2017   Hypercholesterolemia 03/30/2003   Other postprocedural status(V45.89) 03/30/2003   Chest pain 03/30/2003   Former smoker 02/26/1995   Tobacco use disorder 02/26/1995    ONSET DATE: 04/19/2022  REFERRING DIAG: R13.10 (ICD-10-CM) - Dysphagia, unspecified   PERTINENT HISTORY/DIAGNOSTIC FINDINGS: Pt is a 66 year old male who is s/p Pt is a 66 year old male who was referred by his ENT, Dr Carloyn Manner, d/t concerns with pharyngeal dysphagia. Pt with right tonsillar squamous cell carcinoma. Completed last dose of Cisplatin IV on 05/26/2020 annd 70Gy radiation treatment on 05/28/2020. Pt with previous Modified Barium  Swallow Studies on 10/04/2020 with no penetration or aspiration observed and a second Modified Barium Swallow on 04/19/2022 that revealed 1 instance of penetration. However more concerning was moderate valleuclar residue when consuming puree and soft solids. Pt unable to clear residue and hocks it up. Currently pt is on  a liquid diet d/t these pharyngeal impairments. Recommend Outpatient ST services to target hyolaryngeal movement and pharyngeal strengthening for diet advancement.   THERAPY DIAG:  Dysphagia, pharyngeal phase  Right tonsillar squamous cell carcinoma (HCC)  Rationale for Evaluation and Treatment Rehabilitation  SUBJECTIVE: Pt eager, has been practicing his pharyngeal strengthening   Pt accompanied by: self  PAIN:  Are you having pain? No  PATIENT GOALS: to be able to eat solids  OBJECTIVE:   TODAY'S TREATMENT: Skilled treatment session focused on pt's dysphagia goals, specifically pharyngeal strengthening. SLP facilitated the session by providing the following interventions:  Mod I with Mendelsohn Maneuver  Pt provided a list of food items that he can comfortably swallow. While pt's list appears varied, he mainly consuming liquids (Boost). Plan made for pt to keep a food journal with skilled instruction provided in consuming food textures to also improve pharyngeal function and nutrition.    PATIENT EDUCATION: Education details: pharyngeal exercises Person educated: Patient Education method: Explanation, Demonstration, and Handouts Education comprehension: verbalized understanding and returned demonstration  GOALS: Goals reviewed with patient? Yes   SHORT TERM GOALS: Target date: 10 sessions   Pt will complete pharyngeal strengthening exercises.  Baseline: new goal Goal status: INITIAL     LONG TERM GOALS: Target date: 07/23/2022 Pt will consume least restrictive diet with minimal report of pharyngeal residue as evidenced by decreased hocking up of soft solids and regular solids.  Baseline: unable to swallow soft solids Goal status: INITIAL ASSESSMENT:  CLINICAL IMPRESSION: Pt presents with improving dysphagia as evidenced by improved management of his own secretions. He continues to exhibit minimal throat clears in a 45 minute session and also presents with improved  vocal intensity and quality.   OBJECTIVE IMPAIRMENTS include dysphagia. These impairments are limiting patient from safety when swallowing. Factors affecting potential to achieve goals and functional outcome are  radiation of pharyngeal muscles . Patient will benefit from skilled SLP services to address above impairments and improve overall function.  REHAB POTENTIAL: Good  PLAN: SLP FREQUENCY: 1-2x/week  SLP DURATION: 12 weeks  PLANNED INTERVENTIONS: Aspiration precaution training, Pharyngeal strengthening exercises, Diet toleration management , Trials of upgraded texture/liquids, SLP instruction and feedback, and Patient/family education   Koreena Joost B. Rutherford Nail, M.S., CCC-SLP, Mining engineer Certified Brain Injury [place]  [place] Office [phone] Ascom [phone] Fax [phone]

## 2022-05-20 ENCOUNTER — Ambulatory Visit: Payer: Medicare PPO | Attending: Otolaryngology | Admitting: Speech Pathology

## 2022-05-20 DIAGNOSIS — C099 Malignant neoplasm of tonsil, unspecified: Secondary | ICD-10-CM | POA: Insufficient documentation

## 2022-05-20 DIAGNOSIS — R1313 Dysphagia, pharyngeal phase: Secondary | ICD-10-CM | POA: Insufficient documentation

## 2022-05-20 NOTE — Therapy (Signed)
OUTPATIENT SPEECH LANGUAGE PATHOLOGY TREATMENT NOTE   Patient Name: Lawrence Wells MRN: [id number] DOB:12/16/1955, 66 y.o., male Today's Date: 05/20/2022  PCP: Nobie Putnam, DO REFERRING PROVIDER: Carloyn Manner, MD  END OF SESSION:   End of Session - 05/20/22 0812     Visit Number 4    Number of Visits 25    Date for SLP Re-Evaluation 08/02/22    Authorization Type [employer] Medicare Choice PPO    Progress Note Due on Visit 10    SLP Start Time 0800    SLP Stop Time  0845    SLP Time Calculation (min) 45 min             Past Medical History:  Diagnosis Date   Arthritis    Collagen vascular disease ([place])    Rhematoid Arthritis   COPD (chronic obstructive pulmonary disease) ([place])    Coronary artery disease    Dyspnea    Emphysema lung ([place])    Hyperlipidemia    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[place])    Past Surgical History:  Procedure Laterality Date   SINUS SURGERY WITH INSTATRAK     Patient Active Problem List   Diagnosis Date Noted   COPD with acute exacerbation ([place]) 08/02/2021   CAP (community acquired pneumonia) 08/02/2021   Encounter for follow-up examination after completed treatment for malignant neoplasm 06/05/2021   History of radiation to head and neck region 06/05/2021   Xerostomia 06/05/2021   Receives feedings through gastrostomy ([place]) 06/09/2020   Severe protein-calorie malnutrition ([place]) 05/20/2020   Mucositis due to radiation therapy 05/18/2020   CINV (chemotherapy-induced nausea and vomiting) 05/12/2020   Lymphadenopathy of head and neck 04/20/2020   Neoplasm related pain 04/20/2020   Thrush 04/20/2020   Abnormal weight loss 04/17/2020   Encounter for antineoplastic chemotherapy 04/12/2020   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   Atrial fibrillation ([place]) 02/24/2020   Foot drop 02/15/2020   Aortic atherosclerosis ([place]) 02/10/2020   Centrilobular emphysema ([place]) 02/09/2020    Raynaud's phenomenon without gangrene 02/02/2020   Encounter for long-term (current) use of high-risk medication 02/02/2020   Encounter for pre-transplant evaluation for lung transplant 01/13/2020   Chronic respiratory failure with hypoxia ([place]) 01/10/2020   On home oxygen therapy 01/10/2020   Organ transplant candidate 01/10/2020   Pulmonary nodule, left 01/10/2020   Rheumatoid arthritis involving multiple sites with positive rheumatoid factor ([place]) 12/28/2019   ILD (interstitial lung disease) ([place]) 12/28/2019   Coronary artery calcification 09/26/2019   Pulmonary fibrosis ([place]) 09/08/2019   Myalgia due to statin 09/08/2019   IPF (idiopathic pulmonary fibrosis) ([place]) 09/08/2019   Chronic hip pain, bilateral 06/09/2017   Chronic bilateral low back pain with bilateral sciatica 06/09/2017   Osteoarthritis of multiple joints 05/07/2017   Genital herpes 05/07/2017   Screening for prostate cancer 05/07/2017   Hypercholesterolemia 03/30/2003   Other postprocedural status(V45.89) 03/30/2003   Chest pain 03/30/2003   Former smoker 02/26/1995   Tobacco use disorder 02/26/1995    ONSET DATE: 04/19/2022  REFERRING DIAG: R13.10 (ICD-10-CM) - Dysphagia, unspecified   PERTINENT HISTORY/DIAGNOSTIC FINDINGS: Pt is a 66 year old male who is s/p Pt is a 66 year old male who was referred by his ENT, Dr Carloyn Manner, d/t concerns with pharyngeal dysphagia. Pt with right tonsillar squamous cell carcinoma. Completed last dose of Cisplatin IV on 05/26/2020 annd 70Gy radiation treatment on 05/28/2020. Pt with previous Modified Barium Swallow Studies on 10/04/2020 with no penetration or aspiration  observed and a second Modified Barium Swallow on 04/19/2022 that revealed 1 instance of penetration. However more concerning was moderate valleuclar residue when consuming puree and soft solids. Pt unable to clear residue and hocks it up. Currently pt is on a liquid diet d/t these pharyngeal impairments. Recommend  Outpatient ST services to target hyolaryngeal movement and pharyngeal strengthening for diet advancement.   THERAPY DIAG:  Dysphagia, pharyngeal phase  Right tonsillar squamous cell carcinoma (HCC)  Rationale for Evaluation and Treatment Rehabilitation  SUBJECTIVE: "this is all that I have eaten" as he was handing SLP his food journal  Pt accompanied by: self  PAIN:  Are you having pain? No  PATIENT GOALS: to be able to eat solids  OBJECTIVE:   TODAY'S TREATMENT: Skilled treatment session focused on pt's dysphagia goals, specifically pharyngeal strengthening. SLP facilitated the session by providing the following interventions:  Pt brought in food journal Friday Sept 29th Coffee - Boost 3 eggs-Boost Noodles with prego sauce  Sat Sept 30th Coffee - Boost [employer] salisbury steak, fried okra, collards - boost Chicken and noodles  Sunday Oct 1 -  Coffee - Boost Chicken and Noodles - Boost Chicken and Noodles - Cake  Pt not loosing weight and states that he fills full/satisfied. Extensive education provided on significantly reduced calorie intake with education to try and consume 1 additional food item each day.   Min A faded to supervision for Masako, specifically to extend tongue further out of mouth  Also introduced and instructed in chin-tuck-against-resistance, hand out provided     PATIENT EDUCATION: Education details: pharyngeal exercises Person educated: Patient Education method: Explanation, Demonstration, and Handouts Education comprehension: verbalized understanding and returned demonstration  GOALS: Goals reviewed with patient? Yes   SHORT TERM GOALS: Target date: 10 sessions   Pt will complete pharyngeal strengthening exercises.  Baseline: new goal Goal status: INITIAL     LONG TERM GOALS: Target date: 07/23/2022 Pt will consume least restrictive diet with minimal report of pharyngeal residue as evidenced by decreased hocking up of soft solids and  regular solids.  Baseline: unable to swallow soft solids Goal status: INITIAL ASSESSMENT:  CLINICAL IMPRESSION: Pt's vocal quality continues to improve and is no longer wet and his habitual throat clears are substantially reduced. Had pt fill out the EAT-10 PROM.  EATING ASSESSMENT TOOL (EAT-10)   The patient was asked to rate to what extent the following statements are problematic on a scale of 0-4. 0 = No problem; 4 = Severe problem. A total score of 3 or higher is considered abnormal.  1.) My swallowing problem has caused me to lose weight. 1 2.) My swallowing problem interferes with my ability to go out for meals. 1 3.) Swallowing liquids takes extra effort. 0 4.) Swallowing solids takes extra effort. 1  5.) Swallowing pills takes extra effort. 0 6.) Swallowing is painful. 0 7.) The pleasure of eating is affected by my swallowing. 2 8.) When I swallow food sticks in my throat. 2 9.) I cough when I eat. 0 10.) Swallowing is stressful. 0   TOTAL SCORE: 7    OBJECTIVE IMPAIRMENTS include dysphagia. These impairments are limiting patient from safety when swallowing. Factors affecting potential to achieve goals and functional outcome are  radiation of pharyngeal muscles . Patient will benefit from skilled SLP services to address above impairments and improve overall function.  REHAB POTENTIAL: Good  PLAN: SLP FREQUENCY: 1-2x/week  SLP DURATION: 12 weeks  PLANNED INTERVENTIONS: Aspiration precaution training, Pharyngeal strengthening exercises,  Diet toleration management , Trials of upgraded texture/liquids, SLP instruction and feedback, and Patient/family education   Medina Degraffenreid B. Rutherford Nail, M.S., CCC-SLP, Mining engineer Certified Brain Injury [place]  [place] Office [phone] Ascom [phone] Fax [phone]

## 2022-05-24 ENCOUNTER — Other Ambulatory Visit (HOSPITAL_COMMUNITY): Payer: Self-pay

## 2022-05-28 ENCOUNTER — Ambulatory Visit: Payer: Medicare PPO | Admitting: Speech Pathology

## 2022-05-28 DIAGNOSIS — C099 Malignant neoplasm of tonsil, unspecified: Secondary | ICD-10-CM

## 2022-05-28 DIAGNOSIS — R1313 Dysphagia, pharyngeal phase: Secondary | ICD-10-CM | POA: Diagnosis not present

## 2022-05-28 NOTE — Therapy (Signed)
OUTPATIENT SPEECH LANGUAGE PATHOLOGY TREATMENT NOTE   Patient Name: Lawrence Wells MRN: [id number] DOB:27-Jun-1956, 66 y.o., male Today's Date: 05/28/2022  PCP: Nobie Putnam, DO REFERRING PROVIDER: Carloyn Manner, MD  END OF SESSION:   End of Session - 05/28/22 1244     Visit Number 5    Number of Visits 25    Date for SLP Re-Evaluation 08/02/22    Authorization Type [employer] Medicare Choice PPO    Progress Note Due on Visit 10    SLP Start Time 1000    SLP Stop Time  1045    SLP Time Calculation (min) 45 min    Activity Tolerance Patient tolerated treatment well             Past Medical History:  Diagnosis Date   Arthritis    Collagen vascular disease ([place])    Rhematoid Arthritis   COPD (chronic obstructive pulmonary disease) ([place])    Coronary artery disease    Dyspnea    Emphysema lung ([place])    Hyperlipidemia    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[place])    Past Surgical History:  Procedure Laterality Date   SINUS SURGERY WITH INSTATRAK     Patient Active Problem List   Diagnosis Date Noted   COPD with acute exacerbation ([place]) 08/02/2021   CAP (community acquired pneumonia) 08/02/2021   Encounter for follow-up examination after completed treatment for malignant neoplasm 06/05/2021   History of radiation to head and neck region 06/05/2021   Xerostomia 06/05/2021   Receives feedings through gastrostomy ([place]) 06/09/2020   Severe protein-calorie malnutrition ([place]) 05/20/2020   Mucositis due to radiation therapy 05/18/2020   CINV (chemotherapy-induced nausea and vomiting) 05/12/2020   Lymphadenopathy of head and neck 04/20/2020   Neoplasm related pain 04/20/2020   Thrush 04/20/2020   Abnormal weight loss 04/17/2020   Encounter for antineoplastic chemotherapy 04/12/2020   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   Atrial fibrillation ([place]) 02/24/2020   Foot drop 02/15/2020   Aortic atherosclerosis ([place])  02/10/2020   Centrilobular emphysema ([place]) 02/09/2020   Raynaud's phenomenon without gangrene 02/02/2020   Encounter for long-term (current) use of high-risk medication 02/02/2020   Encounter for pre-transplant evaluation for lung transplant 01/13/2020   Chronic respiratory failure with hypoxia ([place]) 01/10/2020   On home oxygen therapy 01/10/2020   Organ transplant candidate 01/10/2020   Pulmonary nodule, left 01/10/2020   Rheumatoid arthritis involving multiple sites with positive rheumatoid factor ([place]) 12/28/2019   ILD (interstitial lung disease) ([place]) 12/28/2019   Coronary artery calcification 09/26/2019   Pulmonary fibrosis ([place]) 09/08/2019   Myalgia due to statin 09/08/2019   IPF (idiopathic pulmonary fibrosis) ([place]) 09/08/2019   Chronic hip pain, bilateral 06/09/2017   Chronic bilateral low back pain with bilateral sciatica 06/09/2017   Osteoarthritis of multiple joints 05/07/2017   Genital herpes 05/07/2017   Screening for prostate cancer 05/07/2017   Hypercholesterolemia 03/30/2003   Other postprocedural status(V45.89) 03/30/2003   Chest pain 03/30/2003   Former smoker 02/26/1995   Tobacco use disorder 02/26/1995    ONSET DATE: 04/19/2022  REFERRING DIAG: R13.10 (ICD-10-CM) - Dysphagia, unspecified   PERTINENT HISTORY/DIAGNOSTIC FINDINGS: Pt is a 66 year old male who is s/p Pt is a 66 year old male who was referred by his ENT, Dr Carloyn Manner, d/t concerns with pharyngeal dysphagia. Pt with right tonsillar squamous cell carcinoma. Completed last dose of Cisplatin IV on 05/26/2020 annd 70Gy radiation treatment on 05/28/2020. Pt with previous Modified Barium  Swallow Studies on 10/04/2020 with no penetration or aspiration observed and a second Modified Barium Swallow on 04/19/2022 that revealed 1 instance of penetration. However more concerning was moderate valleuclar residue when consuming puree and soft solids. Pt unable to clear residue and hocks it up. Currently pt is on  a liquid diet d/t these pharyngeal impairments. Recommend Outpatient ST services to target hyolaryngeal movement and pharyngeal strengthening for diet advancement.   THERAPY DIAG:  Dysphagia, pharyngeal phase  Right tonsillar squamous cell carcinoma (HCC)  Rationale for Evaluation and Treatment Rehabilitation  SUBJECTIVE: "this is all that I have eaten" as he was handing SLP his food journal  Pt accompanied by: self  PAIN:  Are you having pain? No  PATIENT GOALS: to be able to eat solids  OBJECTIVE:   TODAY'S TREATMENT: Skilled treatment session focused on pt's dysphagia goals, specifically pharyngeal strengthening. SLP facilitated the session by providing the following interventions:  Pt states that he is gaining weight. He is "right around 130-132lb" and "was 125-126 when I started."   Pt reports attempting to eat more food items instead of drinking Boost.   Pt was independent with Masako, pitch glides, chin tuck against resistance and Mendelson.     PATIENT EDUCATION: Education details: pharyngeal exercises Person educated: Patient Education method: Explanation, Demonstration, and Handouts Education comprehension: verbalized understanding and returned demonstration  GOALS: Goals reviewed with patient? Yes   SHORT TERM GOALS: Target date: 10 sessions   Pt will complete pharyngeal strengthening exercises.  Baseline: new goal Goal status: INITIAL     LONG TERM GOALS: Target date: 07/23/2022 Pt will consume least restrictive diet with minimal report of pharyngeal residue as evidenced by decreased hocking up of soft solids and regular solids.  Baseline: unable to swallow soft solids Goal status: INITIAL ASSESSMENT:  CLINICAL IMPRESSION: Pt reports that he is not clearing his throat as much as he use to and his vocal quality is much improved "sounds so much stronger." Pt also states that he is eating more food that he would have been "scared" to try before therapy.  Recommend an additional 1-2 sessions to complete all education.    OBJECTIVE IMPAIRMENTS include dysphagia. These impairments are limiting patient from safety when swallowing. Factors affecting potential to achieve goals and functional outcome are  radiation of pharyngeal muscles . Patient will benefit from skilled SLP services to address above impairments and improve overall function.  REHAB POTENTIAL: Good  PLAN: SLP FREQUENCY: 1-2x/week  SLP DURATION: 12 weeks  PLANNED INTERVENTIONS: Aspiration precaution training, Pharyngeal strengthening exercises, Diet toleration management , Trials of upgraded texture/liquids, SLP instruction and feedback, and Patient/family education   Maurice Fotheringham B. Rutherford Nail, M.S., CCC-SLP, Mining engineer Certified Brain Injury [place]  [place] Office [phone] Ascom [phone] Fax [phone]

## 2022-05-30 ENCOUNTER — Other Ambulatory Visit (HOSPITAL_COMMUNITY): Payer: Self-pay

## 2022-05-31 ENCOUNTER — Ambulatory Visit: Payer: Medicare PPO | Admitting: Speech Pathology

## 2022-05-31 DIAGNOSIS — R1313 Dysphagia, pharyngeal phase: Secondary | ICD-10-CM | POA: Diagnosis not present

## 2022-05-31 DIAGNOSIS — C099 Malignant neoplasm of tonsil, unspecified: Secondary | ICD-10-CM | POA: Diagnosis not present

## 2022-05-31 NOTE — Therapy (Signed)
OUTPATIENT SPEECH LANGUAGE PATHOLOGY TREATMENT NOTE DISCHARGE SUMMARY   Patient Name: ZAQUAN DUFFNER MRN: [id number] DOB:[birth year], 66 y.o., male Today's Date: 05/31/2022  PCP: Nobie Putnam, DO REFERRING PROVIDER: Carloyn Manner, MD  END OF SESSION:   End of Session - 05/31/22 0806     Visit Number 6    Number of Visits 25    Date for SLP Re-Evaluation 08/02/22    Authorization Type [employer] Medicare Choice PPO    Progress Note Due on Visit 10    SLP Start Time 0800    SLP Stop Time  0835    SLP Time Calculation (min) 35 min    Activity Tolerance Patient tolerated treatment well             Past Medical History:  Diagnosis Date   Arthritis    Collagen vascular disease ([place])    Rhematoid Arthritis   COPD (chronic obstructive pulmonary disease) ([place])    Coronary artery disease    Dyspnea    Emphysema lung ([place])    Hyperlipidemia    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[place])    Past Surgical History:  Procedure Laterality Date   SINUS SURGERY WITH INSTATRAK     Patient Active Problem List   Diagnosis Date Noted   COPD with acute exacerbation ([place]) 08/02/2021   CAP (community acquired pneumonia) 08/02/2021   Encounter for follow-up examination after completed treatment for malignant neoplasm 06/05/2021   History of radiation to head and neck region 06/05/2021   Xerostomia 06/05/2021   Receives feedings through gastrostomy ([place]) 06/09/2020   Severe protein-calorie malnutrition ([place]) 05/20/2020   Mucositis due to radiation therapy 05/18/2020   CINV (chemotherapy-induced nausea and vomiting) 05/12/2020   Lymphadenopathy of head and neck 04/20/2020   Neoplasm related pain 04/20/2020   Thrush 04/20/2020   Abnormal weight loss 04/17/2020   Encounter for antineoplastic chemotherapy 04/12/2020   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   Atrial fibrillation ([place]) 02/24/2020   Foot drop 02/15/2020   Aortic  atherosclerosis ([place]) 02/10/2020   Centrilobular emphysema ([place]) 02/09/2020   Raynaud's phenomenon without gangrene 02/02/2020   Encounter for long-term (current) use of high-risk medication 02/02/2020   Encounter for pre-transplant evaluation for lung transplant 01/13/2020   Chronic respiratory failure with hypoxia ([place]) 01/10/2020   On home oxygen therapy 01/10/2020   Organ transplant candidate 01/10/2020   Pulmonary nodule, left 01/10/2020   Rheumatoid arthritis involving multiple sites with positive rheumatoid factor ([place]) 12/28/2019   ILD (interstitial lung disease) ([place]) 12/28/2019   Coronary artery calcification 09/26/2019   Pulmonary fibrosis ([place]) 09/08/2019   Myalgia due to statin 09/08/2019   IPF (idiopathic pulmonary fibrosis) ([place]) 09/08/2019   Chronic hip pain, bilateral 06/09/2017   Chronic bilateral low back pain with bilateral sciatica 06/09/2017   Osteoarthritis of multiple joints 05/07/2017   Genital herpes 05/07/2017   Screening for prostate cancer 05/07/2017   Hypercholesterolemia 03/30/2003   Other postprocedural status(V45.89) 03/30/2003   Chest pain 03/30/2003   Former smoker 02/26/1995   Tobacco use disorder 02/26/1995    ONSET DATE: 04/19/2022  REFERRING DIAG: R13.10 (ICD-10-CM) - Dysphagia, unspecified   PERTINENT HISTORY/DIAGNOSTIC FINDINGS: Pt is a 66 year old male who is s/p Pt is a 66 year old male who was referred by his ENT, Dr Carloyn Manner, d/t concerns with pharyngeal dysphagia. Pt with right tonsillar squamous cell carcinoma. Completed last dose of Cisplatin IV on 05/26/2020 annd 70Gy radiation treatment on 05/28/2020. Pt with previous  Modified Barium Swallow Studies on 10/04/2020 with no penetration or aspiration observed and a second Modified Barium Swallow on 04/19/2022 that revealed 1 instance of penetration. However more concerning was moderate valleuclar residue when consuming puree and soft solids. Pt unable to clear residue and hocks it  up. Currently pt is on a liquid diet d/t these pharyngeal impairments. Recommend Outpatient ST services to target hyolaryngeal movement and pharyngeal strengthening for diet advancement.   THERAPY DIAG:  Dysphagia, pharyngeal phase  Right tonsillar squamous cell carcinoma (HCC)  Rationale for Evaluation and Treatment Rehabilitation  SUBJECTIVE: "My weight is staying stable"  Pt accompanied by: self  PAIN:  Are you having pain? No  PATIENT GOALS: to be able to eat solids  OBJECTIVE:   TODAY'S TREATMENT: Skilled treatment session focused on pt's dysphagia goals, specifically pharyngeal strengthening. SLP facilitated the session by providing the following interventions:  Pt continues to be independent with Masako, pitch glides, chin tuck against resistance and Mendelson. He is independent with knowledge of various nutrient groups and reports increased consumption of food textures.     PATIENT EDUCATION: Education details: DAILY  practice of pharyngeal exercises Person educated: Patient Education method: Explanation, Demonstration, and Handouts Education comprehension: verbalized understanding and returned demonstration  GOALS: Goals reviewed with patient? Yes   SHORT TERM GOALS: Target date: 10 sessions   Pt will complete pharyngeal strengthening exercises.  Baseline: new goal Goal status: MET 05/31/2022     LONG TERM GOALS: Target date: 07/23/2022 Pt will consume least restrictive diet with minimal report of pharyngeal residue as evidenced by decreased hocking up of soft solids and regular solids.  Baseline: unable to swallow soft solids Goal status: MET 05/31/2022 ASSESSMENT:  CLINICAL IMPRESSION: Pt presents with improved pharyngeal abilities. As such, he reports increased consumption of various food textures and reports weight gain. At this time, he is independent with pharyngeal strengthening exercises and all education has been completed. Pt is appropriate for  discharge from skilled ST intervention at this time.     Lennox Dolberry B. Rutherford Nail, M.S., CCC-SLP, Mining engineer Certified Brain Injury [place]  [place] Office [phone] Ascom [phone] Fax [phone]

## 2022-06-05 ENCOUNTER — Other Ambulatory Visit (HOSPITAL_COMMUNITY): Payer: Self-pay

## 2022-06-07 ENCOUNTER — Ambulatory Visit: Payer: Medicare PPO | Admitting: Speech Pathology

## 2022-06-14 ENCOUNTER — Encounter: Payer: Medicare PPO | Admitting: Speech Pathology

## 2022-06-25 ENCOUNTER — Other Ambulatory Visit
Admission: RE | Admit: 2022-06-25 | Discharge: 2022-06-25 | Disposition: A | Discharge status: Home / Self Care | Payer: Medicare PPO | Attending: Adult Health | Admitting: Adult Health

## 2022-06-25 ENCOUNTER — Ambulatory Visit: Payer: Medicare PPO | Admitting: Adult Health

## 2022-06-25 ENCOUNTER — Other Ambulatory Visit (HOSPITAL_COMMUNITY): Payer: Self-pay

## 2022-06-25 ENCOUNTER — Encounter: Payer: Self-pay | Admitting: Adult Health

## 2022-06-25 VITALS — BP 122/70 | HR 71 | Temp 98.3°F | Ht 71.0 in | Wt 133.4 lb

## 2022-06-25 DIAGNOSIS — J849 Interstitial pulmonary disease, unspecified: Secondary | ICD-10-CM | POA: Insufficient documentation

## 2022-06-25 DIAGNOSIS — E43 Unspecified severe protein-calorie malnutrition: Secondary | ICD-10-CM | POA: Diagnosis not present

## 2022-06-25 DIAGNOSIS — J432 Centrilobular emphysema: Secondary | ICD-10-CM | POA: Diagnosis not present

## 2022-06-25 LAB — COMPREHENSIVE METABOLIC PANEL
ALT: 12 U/L (ref 0–44)
AST: 22 U/L (ref 15–41)
Albumin: 3.9 g/dL (ref 3.5–5.0)
Alkaline Phosphatase: 68 U/L (ref 38–126)
Anion gap: 6 (ref 5–15)
BUN: 15 mg/dL (ref 8–23)
CO2: 28 mmol/L (ref 22–32)
Calcium: 9.4 mg/dL (ref 8.9–10.3)
Chloride: 104 mmol/L (ref 98–111)
Creatinine, Ser: 1 mg/dL (ref 0.61–1.24)
GFR, Estimated: >60 mL/min (ref >60)
Glucose, Bld: 89 mg/dL (ref 70–99)
Potassium: 4.4 mmol/L (ref 3.5–5.1)
Sodium: 138 mmol/L (ref 135–145)
Total Bilirubin: 0.6 mg/dL (ref 0.3–1.2)
Total Protein: 6.8 g/dL (ref 6.5–8.1)

## 2022-06-25 NOTE — Progress Notes (Signed)
$'@Patient'e$  ID: Lawrence Wells, male    DOB: [birth year], 66 y.o.   MRN: [id number]  Chief Complaint  Patient presents with   Follow-up    Referring provider: Nobie Wells *  HPI: 65 year old male former smoker seen for pulmonary consult September 10, 2019 for abnormal CT chest with interstitial lung disease and lung nodule.  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  He is followed by rheumatology for rheumatoid arthritis Medical history significant for tonsillar cancer diagnosed in July 2021 status post chemoradiation.  During this time.  He did require PEG tube briefly.  Patient had COVID-19 infection in June 2021.  TEST/EVENTS :  ILD :  Unable to tolerate Ofev  HRCT chest September 14, 2019 showed widespread areas of septal thickening, subpleural reticulation, parenchymal banding and traction bronchiectasis with extensive honeycombing most severe in the left lung with a definite craniocaudal gradient.  Moderate emphysema.  Consistent with a UIP pattern.   January 20, 2020 HRCT chest ILD changes with UIP pattern appears stable   PET scan March 14, 2020 showed right palatine tonsil markedly hypermetabolic, low level hypermetabolism in the bilateral cervical nodes, no typical findings of extra cervical metastatic disease.  Thoracic adenopathy and low-level hypermetabolism possibly reactive and secondary to ILD.   PET scan August 30, 2020 no definite residual mass or tumor within the region of the right tonsillar extending along the mucosal surface into the right vallecula.  No suspicious lymphadenopathy noted. 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  Patient was seen last visit to follow his ILD related to connective tissue disease. Followed by rheumatology for rheumatoid arthritis.  On Imuran and Plaquenil. Last visit, CT chest in June 2023 that showed progressive UIP changes.  Previously had been on Ofev but was unable to tolerate.  Last visit was started on Esbriet.  PFTs in September showed stable lung function with DLCO slightly improved at 39%.  Patient was started on lower dose Esbriet protocol.  Maximum dose 2 pills 3 times daily.  Since last visit patient says he is doing okay.  Seems to be tolerating Esbriet without significant side effects.  Weight has been stable.  Up to 5 pounds currently at 133 pounds.  He denies any nausea vomiting or diarrhea.  No bloody stools.  LFTs last visit were normal.  He is due for repeat labs today. Minimal cough  Remains very active , yard work, cutting wood, not limited currently by breathing. Gets winded will rest for short time and then resumes activity.  Flu shot utd. Declines Covid booster. Prevnar 13 utd.  Just finished speech therapy rehab for dysphagia. Completed pulmonary rehab in past.     Allergies  Allergen Reactions   Atorvastatin Other (See Comments)    Myalgias    Immunization History  Administered Date(s) Administered   Fluad Quad(high Dose 65+) 08/10/2021, 04/30/2022   Influenza, High Dose Seasonal PF 07/28/2020   PFIZER(Purple Top)SARS-COV-2 Vaccination 11/22/2019, 12/15/2019   Pneumococcal Conjugate-13 09/08/2019   Tdap 04/19/2015, 05/07/2017   Zoster Recombinat (Shingrix) 09/29/2019, 11/27/2019    Past Medical History:  Diagnosis Date   Arthritis  Collagen vascular disease (HCC)    Rhematoid Arthritis   COPD (chronic obstructive pulmonary disease) (HCC)    Coronary artery disease    Dyspnea    Emphysema lung (HCC)    Hyperlipidemia    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HCC)     Tobacco History: Social History   Tobacco Use   Smoking Status Former   Packs/day: 1.50   Years: 30.00   Total pack years: 45.00   Types: Cigarettes   Quit date: 2007   Years since quitting: 16.8  Smokeless Tobacco Former   Types: Chew   Quit date: 2007  Tobacco Comments   Dip smokeless tobacco >20-30 years   Counseling given: Not Answered Tobacco comments: Dip smokeless tobacco >20-30 years   Outpatient Medications Prior to Visit  Medication Sig Dispense Refill   azaTHIOprine (IMURAN) 50 MG tablet Take 2 tablets (100 mg total) by mouth daily. 180 tablet 0   ezetimibe (ZETIA) 10 MG tablet Take 1 tablet (10 mg total) by mouth daily. 90 tablet 3   hydroxychloroquine (PLAQUENIL) 200 MG tablet Take 1 tablet by mouth daily.     meclizine (ANTIVERT) 25 MG tablet Take 1 tablet (25 mg total) by mouth 3 (three) times daily as needed for dizziness. 30 tablet 2   Multiple Vitamins-Minerals (CENTRUM SILVER 50+MEN PO) Take by mouth daily.     Pirfenidone (ESBRIET) 267 MG TABS Take 2 tablets (534 mg total) by mouth with breakfast, with lunch, and with evening meal. 180 tablet 4   rizatriptan (MAXALT-MLT) 10 MG disintegrating tablet Take 1 tablet (10 mg total) by mouth as needed for migraine. May repeat in 2 hours if needed 10 tablet 0   tobramycin-dexamethasone (TOBRADEX) ophthalmic solution Place into the left eye.     Pirfenidone (ESBRIET) 267 MG TABS Month 1: Take one tablet three times a day for 7 days, followed by two tablets three times a day for maintenance. 159 tablet 0   No facility-administered medications prior to visit.     Review of Systems:   Constitutional:   No  weight loss, night sweats,  Fevers, chills, fatigue, or  lassitude.  HEENT:   No headaches,  Difficulty swallowing,  Tooth/dental problems, or  Sore throat,                No sneezing, itching, ear ache, nasal congestion, post nasal drip,   CV:  No chest pain,  Orthopnea, PND, swelling in lower extremities, anasarca, dizziness, palpitations, syncope.   GI  No  heartburn, indigestion, abdominal pain, nausea, vomiting, diarrhea, change in bowel habits, loss of appetite, bloody stools.   Resp: .  No chest wall deformity  Skin: no rash or lesions.  GU: no dysuria, change in color of urine, no urgency or frequency.  No flank pain, no hematuria   MS:  No joint pain or swelling.  No decreased range of motion.  No back pain.    Physical Exam  BP 122/70 (BP Location: Left Arm, Cuff Size: Normal)   Pulse 71   Temp 98.3 F (36.8 C) (Temporal)   Ht '5\' 11"'$  (1.803 m)   Wt 133 lb 6.4 oz (60.5 kg)   SpO2 98%   BMI 18.61 kg/m   GEN: A/Ox3; pleasant , NAD, well nourished    HEENT:  [place]/AT,  NOSE-clear, THROAT-clear, no lesions, no postnasal drip or exudate noted.   NECK:  Supple w/ fair ROM; no JVD; normal carotid impulses w/o bruits; no thyromegaly or nodules palpated; no lymphadenopathy.  RESP  Bibasilar Crackles  no accessory muscle use, no dullness to percussion  CARD:  RRR, no m/r/g, no peripheral edema, pulses intact, no cyanosis or clubbing.  GI:   Soft & nt; nml bowel sounds; no organomegaly or masses detected.   Musco: Warm bil, no deformities or joint swelling noted.   Neuro: alert, no focal deficits noted.    Skin: Warm, no lesions or rashes    Lab Results:  CBC      Imaging: No results found.       Latest Ref Rng & Units 04/30/2022    9:04 AM 01/17/2022    3:41 PM  PFT Results  FVC-Pre L 4.01  4.08   FVC-Predicted Pre % 89  86   Pre FEV1/FVC % % 81  82   FEV1-Pre L 3.23  3.33   FEV1-Predicted Pre % 97  95   DLCO uncorrected ml/min/mmHg 10.43  9.05   DLCO UNC% % 39  33   DLCO corrected ml/min/mmHg 10.62  9.24   DLCO COR %Predicted % 40  33   DLVA Predicted % 49  41     No results found for: "NITRICOXIDE"      Assessment & Plan:   ILD (interstitial lung disease) ([place]) Connective tissue disease related ILD.  Patient has underlying rheumatoid arthritis followed by rheumatology.  Currently maintained on  Plaquenil and Imuran.  Recently started on Esbriet for progressive UIP changes on CT chest.  Patient is tolerating Esbriet well.  Clinically appears to be stable.  He remains very active with no change in baseline stamina or endurance.  Previously completed pulmonary rehab.  O2 saturations remain adequate on room air.  Not on oxygen.  We will check labs today with LFTs.  Plan  Patient Instructions  Continue on Esbriet 2 pills Three times a day   Labs today.  Continue follow up with Rheumatology  Continue on Boost  Follow up with Dr. Chase Caller in [place] as planned next month and As needed   Please contact office for sooner follow up if symptoms do not improve or worsen or seek emergency care        Centrilobular emphysema ([place]) Emphysema.  Previous PFTs showed no significant restriction or obstruction.  Patient is very active has minimum symptoms with cough or shortness of breath at this time.  Hold off on inhalers for now. Discussed vaccines.  Flu shot is up-to-date.  Declines COVID booster.  Prevnar 13 is up-to-date.  Severe protein-calorie malnutrition ([place]) [place] seems to be improving.  Continue with boost.  No significant GI side effects from Esbriet.     Rexene Edison, NP 06/25/2022

## 2022-06-25 NOTE — Assessment & Plan Note (Signed)
Emphysema.  Previous PFTs showed no significant restriction or obstruction.  Patient is very active has minimum symptoms with cough or shortness of breath at this time.  Hold off on inhalers for now. Discussed vaccines.  Flu shot is up-to-date.  Declines COVID booster.  Prevnar 13 is up-to-date.

## 2022-06-25 NOTE — Patient Instructions (Addendum)
Continue on Esbriet 2 pills Three times a day   Labs today.  Continue follow up with Rheumatology  Continue on Boost  Follow up with Dr. Chase Caller in [place] as planned next month and As needed   Please contact office for sooner follow up if symptoms do not improve or worsen or seek emergency care

## 2022-06-25 NOTE — Assessment & Plan Note (Signed)
Lawrence Wells seems to be improving.  Continue with boost.  No significant GI side effects from Esbriet.

## 2022-06-25 NOTE — Assessment & Plan Note (Signed)
Connective tissue disease related ILD.  Patient has underlying rheumatoid arthritis followed by rheumatology.  Currently maintained on Plaquenil and Imuran.  Recently started on Esbriet for progressive UIP changes on CT chest.  Patient is tolerating Esbriet well.  Clinically appears to be stable.  He remains very active with no change in baseline stamina or endurance.  Previously completed pulmonary rehab.  O2 saturations remain adequate on room air.  Not on oxygen.  We will check labs today with LFTs.  Plan  Patient Instructions  Continue on Esbriet 2 pills Three times a day   Labs today.  Continue follow up with Rheumatology  Continue on Boost  Follow up with Dr. Chase Caller in [place] as planned next month and As needed   Please contact office for sooner follow up if symptoms do not improve or worsen or seek emergency care

## 2022-06-26 ENCOUNTER — Encounter: Payer: Medicare PPO | Admitting: Speech Pathology

## 2022-06-28 ENCOUNTER — Encounter: Payer: Medicare PPO | Admitting: Speech Pathology

## 2022-07-02 ENCOUNTER — Encounter: Payer: Medicare PPO | Admitting: Speech Pathology

## 2022-07-04 ENCOUNTER — Encounter: Payer: Medicare PPO | Admitting: Speech Pathology

## 2022-07-04 ENCOUNTER — Other Ambulatory Visit (HOSPITAL_COMMUNITY): Payer: Self-pay

## 2022-07-08 ENCOUNTER — Telehealth: Payer: Self-pay | Admitting: Internal Medicine

## 2022-07-08 NOTE — Telephone Encounter (Signed)
Called and spoke with pt letting him know that we should get him in for an appt especially if he thinks that he might have pna and he verbalized understanding. Appt scheduled for pt. Nothing further needed.

## 2022-07-09 ENCOUNTER — Encounter: Payer: Self-pay | Admitting: Pulmonary Disease

## 2022-07-09 ENCOUNTER — Ambulatory Visit (INDEPENDENT_AMBULATORY_CARE_PROVIDER_SITE_OTHER): Payer: Medicare PPO

## 2022-07-09 ENCOUNTER — Ambulatory Visit: Payer: Medicare PPO | Admitting: Pulmonary Disease

## 2022-07-09 VITALS — BP 112/70 | HR 85 | Temp 98.5°F | Ht 71.0 in | Wt 130.2 lb

## 2022-07-09 DIAGNOSIS — Z20828 Contact with and (suspected) exposure to other viral communicable diseases: Secondary | ICD-10-CM | POA: Diagnosis not present

## 2022-07-09 DIAGNOSIS — J8410 Pulmonary fibrosis, unspecified: Secondary | ICD-10-CM | POA: Diagnosis not present

## 2022-07-09 MED ORDER — DOXYCYCLINE HYCLATE 100 MG PO TABS: 100.0000 mg | ORAL_TABLET | Freq: Two times a day (BID) | ORAL | 0 refills | Status: DC

## 2022-07-09 MED ORDER — PREDNISONE 10 MG PO TABS: 30.0000 mg | ORAL_TABLET | Freq: Every day | ORAL | 0 refills | Status: DC

## 2022-07-09 NOTE — Progress Notes (Signed)
Lawrence Wells    [id number]    October 06, 1955  Primary Care Physician:Karamalegos, Devonne Doughty, DO  Referring Physician: Olin Hauser, DO [address] [place],  [place] [address]  Chief complaint:   Patient with cough and green sputum production Exposure to grandson with RSV  HPI:  History of interstitial lung disease, lung nodule History of rheumatoid arthritis History of tonsillar cancer Patient had COVID-19 infection in June 2021  Currently on pirfenidone Follows up regularly with Dr. Chase Caller  He does have emphysema Last PET scan in 2022 shows no definite residual mass or tumor from his treatment for tonsillar cancer  More shortness of breath than usual Gets exhausted easily  Outpatient Encounter Medications as of 07/09/2022  Medication Sig   azaTHIOprine (IMURAN) 50 MG tablet Take 2 tablets (100 mg total) by mouth daily.   ezetimibe (ZETIA) 10 MG tablet Take 1 tablet (10 mg total) by mouth daily.   hydroxychloroquine (PLAQUENIL) 200 MG tablet Take 1 tablet by mouth daily.   meclizine (ANTIVERT) 25 MG tablet Take 1 tablet (25 mg total) by mouth 3 (three) times daily as needed for dizziness.   Multiple Vitamins-Minerals (CENTRUM SILVER 50+MEN PO) Take by mouth daily.   Pirfenidone (ESBRIET) 267 MG TABS Take 2 tablets (534 mg total) by mouth with breakfast, with lunch, and with evening meal.   rizatriptan (MAXALT-MLT) 10 MG disintegrating tablet Take 1 tablet (10 mg total) by mouth as needed for migraine. May repeat in 2 hours if needed   tobramycin-dexamethasone [facility]) ophthalmic solution Place into the left eye.   No facility-administered encounter medications on file as of 07/09/2022.    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[facility])     Past Surgical History:  Procedure Laterality Date   SINUS SURGERY WITH INSTATRAK      Family History  Problem Relation Age of Onset   Heart disease Mother    Heart attack Mother 67   Arthritis Mother    Heart disease Father    Heart attack Father 21   Healthy Sister    Hyperlipidemia Brother    Throat cancer Brother 16   Heart disease Brother    Healthy Son    Healthy Daughter     Social History   Socioeconomic History   Marital status: Married    Spouse name: Teaghan Formica   Number of children: Not on file   Years of education: High School   Highest education level: Not on file  Occupational History   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[employer])    Comment: Retired 2012  Tobacco Use   Smoking status: Former    Packs/day: 1.50    Years: 30.00    Total pack years: 45.00    Types: Cigarettes    Quit date: 2007    Years since quitting: 16.8   Smokeless tobacco: Former    Types: Chew    Quit date: 2007   Tobacco comments:    Dip smokeless tobacco >20-30 years  Vaping Use   Vaping Use: Never used  Substance and Sexual Activity   Alcohol use: Not Currently    Comment: occ   Drug use: No   Sexual activity: Not Currently  Other Topics Concern   Not on file  Social History Narrative   Right handed   One story home   Drinks caffeine   Social Determinants of Health   Financial Resource Strain: Low Risk  (03/01/2022)   Overall Financial Resource Strain (CARDIA)    Difficulty of Paying Living Expenses: Not hard at all  Food Insecurity: No Food Insecurity (03/01/2022)   Hunger Vital Sign    Worried About Running Out of Food in the Last Year: Never true    Ran Out of Food in the Last Year: Never true  Transportation Needs: No Transportation Needs (03/01/2022)   PRAPARE - Hydrologist  (Medical): No    Lack of Transportation (Non-Medical): No  Physical Activity: Insufficiently Active (03/01/2022)   Exercise Vital Sign    Days of Exercise per Week: 3 days    Minutes of Exercise per Session: 30 min  Stress: No Stress Concern Present (03/01/2022)   [place]    Feeling of Stress : Not at all  Social Connections: Moderately Isolated (03/01/2022)   Social Connection and Isolation Panel [NHANES]    Frequency of Communication with Friends and Family: More than three times a week    Frequency of Social Gatherings with Friends and Family: Twice a week    Attends Religious Services: Never    Marine scientist or Organizations: No    Attends Archivist Meetings: Never    Marital Status: Married  Human resources officer Violence: Not At Risk (03/01/2022)   Humiliation, Afraid, Rape, and Kick questionnaire    Fear of Current or Ex-Partner: No    Emotionally Abused: No    Physically Abused: No    Sexually Abused: No    Review of Systems  Constitutional:  Positive for fatigue.  Respiratory:  Positive for cough.     Vitals:   07/09/22 1143  BP: 112/70  Pulse: 85  Temp: 98.5 F (36.9 C)  SpO2: 96%     Physical Exam Constitutional:      Appearance: Normal appearance.  HENT:     Head: Normocephalic.     Mouth/Throat:     Mouth: Mucous membranes are moist.  Eyes:     Pupils: Pupils are equal, round, and reactive to light.  Cardiovascular:     Rate and Rhythm: Normal rate.     Heart sounds: No murmur heard.    No friction rub.  Pulmonary:     Effort: Pulmonary effort is normal. No respiratory distress.     Breath sounds: No stridor. No wheezing or rhonchi.  Musculoskeletal:     Cervical back: No rigidity.  Neurological:     Mental Status: He is alert.  Psychiatric:        Mood and Affect: Mood normal.    Data Reviewed: Previous CT reviewed  Assessment:  Exposure to RSV with  exacerbation of his COPD  May have RSV with associated bronchitis at present  Interstitial lung disease  History of tonsillar tongue cancer  Chronic obstructive pulmonary disease with exacerbation  Plan/Recommendations: Prescription for doxycycline 100 BID for 7  Prescription for prednisone-'30mg'$  daily for 5  Obtain chest x-ray today  Test for RSV  Continue other lines of care  Encouraged to keep upcoming appointment with Dr. Chase Caller  Sherrilyn Rist MD [facility] 07/09/2022, 11:57 AM  CC: Nobie Putnam *

## 2022-07-09 NOTE — Patient Instructions (Signed)
RSV test  Prescription for doxycycline Prescription for prednisone  Chest x-ray today  Keep your appointment with Dr. Chase Caller in a few weeks

## 2022-07-10 ENCOUNTER — Encounter: Payer: Medicare PPO | Admitting: Speech Pathology

## 2022-07-11 LAB — COVID-19, FLU A+B AND RSV
Influenza A, NAA: NOT DETECTED
Influenza B, NAA: NOT DETECTED
RSV, NAA: NOT DETECTED
SARS-CoV-2, NAA: NOT DETECTED

## 2022-07-17 ENCOUNTER — Encounter: Payer: Medicare PPO | Admitting: Speech Pathology

## 2022-07-19 ENCOUNTER — Encounter: Payer: Medicare PPO | Admitting: Speech Pathology

## 2022-07-23 ENCOUNTER — Other Ambulatory Visit (HOSPITAL_COMMUNITY): Payer: Self-pay

## 2022-07-24 ENCOUNTER — Encounter: Payer: Medicare PPO | Admitting: Speech Pathology

## 2022-07-26 ENCOUNTER — Encounter: Payer: Medicare PPO | Admitting: Speech Pathology

## 2022-07-30 ENCOUNTER — Encounter: Payer: Self-pay | Admitting: Internal Medicine

## 2022-07-30 ENCOUNTER — Ambulatory Visit: Payer: Medicare PPO | Admitting: Internal Medicine

## 2022-07-30 VITALS — BP 110/70 | HR 71 | Ht 71.0 in | Wt 135.0 lb

## 2022-07-30 DIAGNOSIS — Z5181 Encounter for therapeutic drug level monitoring: Secondary | ICD-10-CM

## 2022-07-30 DIAGNOSIS — M359 Systemic involvement of connective tissue, unspecified: Secondary | ICD-10-CM | POA: Diagnosis not present

## 2022-07-30 DIAGNOSIS — J441 Chronic obstructive pulmonary disease with (acute) exacerbation: Secondary | ICD-10-CM

## 2022-07-30 DIAGNOSIS — J8489 Other specified interstitial pulmonary diseases: Secondary | ICD-10-CM | POA: Diagnosis not present

## 2022-07-30 DIAGNOSIS — J432 Centrilobular emphysema: Secondary | ICD-10-CM | POA: Diagnosis not present

## 2022-07-30 LAB — HEPATIC FUNCTION PANEL
ALT: 8 U/L (ref 0–53)
AST: 19 U/L (ref 0–37)
Albumin: 3.9 g/dL (ref 3.5–5.2)
Alkaline Phosphatase: 83 U/L (ref 39–117)
Bilirubin, Direct: 0 mg/dL (ref 0.0–0.3)
Total Bilirubin: 0.3 mg/dL (ref 0.2–1.2)
Total Protein: 6.9 g/dL (ref 6.0–8.3)

## 2022-07-30 MED ORDER — PREDNISONE 10 MG PO TABS: ORAL_TABLET | ORAL | 0 refills | Status: AC

## 2022-07-30 MED ORDER — SPIRIVA RESPIMAT 2.5 MCG/ACT IN AERS: 2.0000 | INHALATION_SPRAY | Freq: Every day | RESPIRATORY_TRACT | 5 refills | Status: DC

## 2022-07-30 MED ORDER — AZITHROMYCIN 250 MG PO TABS: ORAL_TABLET | ORAL | 0 refills | Status: DC

## 2022-07-30 MED ORDER — SPIRIVA RESPIMAT 2.5 MCG/ACT IN AERS: 2.0000 | INHALATION_SPRAY | Freq: Every day | RESPIRATORY_TRACT | 0 refills | Status: DC

## 2022-07-30 NOTE — Progress Notes (Signed)
OV 09/10/2019  Subjective:  Patient ID: Lawrence Wells, male , DOB: 07-20-1956 , age 66 y.o. , MRN: [id number] , ADDRESS: [place] [id number]   09/10/2019 -   Chief Complaint  Patient presents with   pulmonary consult    per Dr. Parks Ranger- CXR 09/08/2019. pt reports of sob with exertion, talking and bending, prod cough with yellow mucus, left sided chest discomfort  and wheezing.     HPI Lawrence Wells 66 y.o. -referred for interstitial lung disease after a chest x-ray from September 08, 2019 that I personally visualized and interpreted shows diffuse ILD along with left upper lobe nodule.  Patient has wife tell me that he has had insidious onset of shortness of breath for a year with progression in the last few months.  They also tell me that few years ago he had an chest x-ray done for an incidental unrelated reason that showed chronic changes of scarring but apparently were reassured.  In review of his chart and noticed that in 2005 this is chest x-ray done in the [facility] system with reports of ILD in it.  He is unaware of that.  History for this visit is given by the patient and his wife and review of the chart. [facility] Comprehensive ILD Questionnaire  Symptoms:   Insidious onset of shortness of breath for the last year getting worse in the last 3 to 4 months.  Definitely no dyspnea a few years ago even though chest x-ray from 2005 was reported as ILD.  Sometimes dyspnea is episodic but mostly with exertion relieved by rest.  Symptom severity is listed below.  There is associated significant cough.  Cough started in October 2020.  It is getting worse cough is rated as moderate to severe.  He coughs at night.  He does bring up some yellow phlegm.  It is worse when he lies down.Marland Kitchen  He does clear his throat and he does feel a tickle in the back of the throat.  There is also associated wheezing.       Past Medical History : He gives a history  positive for arthritis.  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  Denies any asthma or known COPD.  Denies heart failure.  Denies scleroderma any collagen vascular disease.  Denies diagnosis of acid reflux or hiatal hernia.  Denies obstructive sleep apnea.  Denies pulmonary hypertension.  Denies diabetes or thyroid disease or stroke or seizures.  Denies infectious mononucleosis.  Denies hepatitis.  Denies tuberculosis denies kidney disease.  Denies blood clots denies heart disease denies pleurisy.   ROS: Positive for fatigue and arthralgia.  Positive for acid reflux.  He went to check the box for [name].  Denies any GI symptoms or rash or ulcers  FAMILY HISTORY of LUNG DISEASE: Denies including COPD and pulmonary fibrosis   EXPOSURE HISTORY: Smokes cigarettes between 1983 and 2007 1 to 2 packs/day and then quit.  Did not smoke cigars did not smoke pipe.  Did not vape.  Never smoked marijuana or cocaine.  Never used intravenous drugs.   HOME and HOBBY DETAILS : [place].  The age of the home is 20 years.  There is no dampness in the living environment.  No mold or mildew.  Does  not use humidifier.  No CPAP use.  Does not use nebulizer.  No steam iron use.  No Jacuzzi.  No pet birds or parakeets in the house.  No pet gerbils.  No feather pillows.  No mold in the [facility] duct.  Does not play wind instruments.  Does not do any gardening.   OCCUPATIONAL HISTORY (122 questions) : Works in the Transport planner for 30 years.  Worked in [employer] and crawl spaces.  A lot of dust exposure.  During this time he worked in damp air-conditioned spaces.  He thinks he had asbestos exposure.  He also worked in the [employer].  He cleaned AC coils.  Otherwise history is negative   PULMONARY TOXICITY HISTORY (27 items):  Entirely negative.       ROS - per HPI    OV 10/22/2019  Subjective:  Patient ID: Lawrence Wells, male , DOB: [birth year] , age 33 y.o. , MRN: [id number] , ADDRESS: [place] [id number]   10/22/2019 -   Chief Complaint  Patient presents with   Follow-up    ILD Followup. SOB on exertion. Non prod cough. Pt denies any wheezing, fever, chills, or sweats.   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  Started on nintedanib 10/12/2019  Associated emphysema present on CT scan January 2021  Normal cardiac stress test 10/14/2019   HPI Lawrence Wells 66 y.o. -presents for follow-up with his wife.  At last saw him end of January 2021 for ILD evaluation.  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  He then saw a rheumatologist Dr. D yesterday 10/21/2019 and given a diagnosis of joint rheumatoid arthritis.  In the interim he also saw pulmonary nurse practitioner for a virtual visit and was started on nintedanib but she started Loraine on 10/12/2019.  So far is tolerating it fine.  He is using oxygen only with exertion.  There is no deterioration in symptoms.  Yesterday the rheumatology visit Imuran has been recommended first rheumatoid arthritis.  The TPMT test has been done and the results are pending.  He does not have liver function tests checked after he started his nintedanib.  He and his wife have many questions about the future course of the disease and natural history and other management strategies.  Currently is free was not helping him but he is willing to take it because of the associated emphysema.   I personally visualized and  reviewed the CT and also interpreted the findings myself   OV 12/02/2019  Subjective:  Patient ID: Lawrence Wells, male , DOB: 1956-06-08 , age 65 y.o. 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  Started on nintedanib 10/12/2019  Associated emphysema present on CT scan January 2021  Normal cardiac stress test 10/14/2019  Rheumatoid arthritis on Imuran  HPI Lawrence Wells 66 y.o. -4 returns for follow-up with his  wife to the [facility].  He is a Engineer, manufacturing systems.  He normally seen in the [place] clinic.  At this point in time he tells me that he is tolerating the nintedanib fine.  He needs a liver function test.  However he has lost weight 160 pounds.  He does not know the reason.  He is tolerating nintedanib fine.  He is on Imuran for his rheumatoid arthritis.  In terms of his effort tolerance it is stable.  He is undergoing pulmonary rehabilitation and is using 3-4 L of oxygen with exertion.  He is still on the waiting list for a portable system.  He is willing to be seen by the transplant team for pulmonary fibrosis..    In terms of his rheumatoid arthritis he is on Imuran he is on a prednisone taper but still having pain.  He had her steroid injections.  He is not on Plaquenil.  I have encouraged him to talk to his rheumatologist about it.  Overall stable  Overall symptom score listed below.   OV 01/06/2020  Subjective:  Patient ID: Lawrence Wells, male , DOB: 1956/07/21 , age 29 y.o. 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  Started on nintedanib 10/12/2019  Associated emphysema present on CT scan January 2021  Normal cardiac stress test 10/14/2019  Rheumatoid arthritis on Imuran   01/06/2020 -     HPI Lawrence Wells 66 y.o. -there is a telephone visit.  Patient identified with 2 person identifier.  Wife also came on the phone.  He tells me in the wife also attest that since starting nintedanib he has continued to lose weight.  Today the weight is 159 pounds.  Diarrhea has started despite taking Imodium once a day.  The diarrhea is severe as documented by a level 4.  He is undergoing pulmonary rehabilitation and this is helped dyspnea but he is dealing with significant pain from the rheumatoid arthritis.  He is undergoing transplant evaluation at [facility] and is quite busy with that.  He has upcoming appointment with me in mid June 2021 for a face-to-face for pulmonary function test and follow-up.  But at this point in time symptoms of weight loss and diarrhea are paramount.     OV 03/07/2020   Subjective:  Patient ID: Lawrence Wells, male , DOB: 11-08-1955, age 14 y.o. years. 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 -presents for follow-up with his  wife.  After his last visit he had COVID-19.  He was hospitalized.  I communicated with the hospitalist.  Since then he is recovered but is now left with  a worse than baseline.  He is now requiring 4 L nasal cannula with exertion even though this is an improvement is worse than his April 2021 baseline.  In terms of his rheumatoid arthritis he is on Imuran and prednisone.  This controls the pain.  However post Covid he developed some throat swelling.  Initially thought was sinus infection.  He saw primary care this then resulted in ENT evaluation.  Mid July 2020 when he has been diagnosed with sudden onset of right tonsillar cancer.  He has [facility] appointment pending.  Because of all this he is no longer a candidate for [facility] lung transplant services.  He is doing pulmonary rehabilitation.  Also because of all this he is lost significant amount of weight as documented below.  Is lost 13 pounds since April 2021.  He is asking for handicap placard.  Symptom scores are listed below.  Overall high symptom burden.  He and his wife are very frustrated by the turn of events.     OV 08/04/2020   Subjective:  Patient ID: Lawrence Wells, male , DOB: 1955-12-17, age 62 y.o. years. , MRN: [id number],  ADDRESS: [place] [address] PCP  Olin Hauser, DO Providers : Treatment Team:  Attending Provider: Brand Males, MD Patient Care Team: Olin Hauser, DO as PCP - General (Family Medicine)    Chief Complaint  Patient presents with   Follow-up    C/o sob with exertion.      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 -returns for follow-up of his pulmonary fibrosis.  He has had a terrible year of 2021 when he is been diagnosed with pulmonary fibrosis and rheumatoid arthritis and then had Covid and then in the aftermath of Covid had right tonsillar cancer.  When I last saw him he had to go to [facility] to get chemo and radiation.  He is here with his wife.  He tells me he is finished chemo and radiation.  He says he lost 50 pounds but has gained a lot of the pounds back.  He has a PEG tube.  Because of the PEG tube and dysphagia he is not been able to take Eliquis or nintedanib.  Despite this he feels that his pulmonary fibrosis is stable.  He does not use his oxygen much.  He says he is lucky to have survived so far.  He continues Imuran for rheumatoid arthritis.  His joints feel stable.  His foot drop is improved.  Of note in the last 4 days he has developed a black coated tongue.  He is in touch with his [place] about this.  Review of his symptom score and desaturation test suggest that his ILD continues to be stable.  He and his wife are wondering when to restart his nintedanib.  He says he is slowly beginning to swallow pills and food.  I have asked him to wait for his PET scan in January 2022 and get it cleared by his cancer and ENT physicians.       OV 12/13/2020  Subjective:  Patient ID: Lawrence Wells, male , DOB: 24-Nov-1955 , age 8 y.o. , MRN: [id number] , ADDRESS: [place] [facility] [id number] PCP Olin Hauser, DO Patient Care Team: Olin Hauser, DO as PCP - General (Family Medicine)  This Provider for this visit: Treatment Team:  Attending Provider: Brand Males, MD     12/13/2020 -   Chief Complaint  Patient presents with   Follow-up    Pt states he has been doing well since last visit and denies any complaints. States he had his oxygen taken back by DME as he was  not using it.     HPI Lawrence Wells 66 y.o. -returns for follow-up.  Last seen in December 2021.  At this point in time he is on Imuran for his rheumatoid arthritis which also serves for his connective Wells disease.  Since May of last he has not been able to take nintedanib because he continues to lose weight.  In the interim he did have tonsillar cancer and had dysphagia and could not actually swallow nintedanib.  His tonsillar cancer is now in remission since January 2022.  I reviewed the PET scan results at [facility] and copied and pasted below.  His PEG tube is off since February 2022.  He is eating well.  He is quite functional however he is continue to lose weight which he believes is because he is more active.  It is unintentional.  But he thinks that the healthy weight loss.  A few months ago he was 140s pounds in weight.  Now he is in his 130s.  He is not overly concerned about it but will monitor.  He is able to walk quite a bit and feels less short of breath.  Today when we walked him he did not desaturate but he refused oxygen.  His last PFT was in January 2021 last CT chest was in summer 2021.  At this point in time he is content doing the Imuran.  He does not want oxygen.  He also [and I agree with him] does not want nintedanib.    PFT  No flowsheet data found.  PET Scan Jan 2022  IMPRESSION:  No definite residual mass/tumor within the region of the right tonsil  extending along the mucosal surface into the right vallecula. No suspicious  lymphadenopathy is identified.   Electronically Reviewed by:  Renae Gloss, MD, [facility]  Electronically Reviewed on:  08/30/2020 3:33 PM     08/27/2021 Follow up ; RA related ILD, pneumonia Patient returns for 1 month follow-up.  Patient was seen last visit with a slow to resolve acute bronchitis plus or minus pneumonia.  Patient had been seen by his primary care provider.  RSV, COVID and flu swabs were negative.  Chest x-ray  showed increasing bibasilar lung opacities.  Suspicious for possible superimposed infection. Patient does have rheumatoid arthritis and is on Plaquenil and Imuran. Patient was recommended to begin Levaquin and a prednisone taper.  Since last visit patient is feeling much better.  Cough and congestion have decreased substantially.  Patient says he is back to baseline feeling good.  Patient says he remains very active at home.  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  He is followed at [facility].  PEG tube was removed in February 2022. Was previously on oxygen but no longer using. Last  used 10/2020. Sent back to DME>   Flu shot is utd.   Remains active at home , works in yard and garden. Hobbies . Very active  Has completed pulmonary rehab in past.  OV 01/17/2022  Subjective:  Patient ID: Lawrence Wells, male , DOB: Lawrence 28, 1957 , age 89 y.o. , MRN: [id number] , ADDRESS: [address] [place] [facility] [address] PCP Olin Hauser, DO Patient Care Team: Olin Hauser, DO as PCP - General (Family Medicine)  This Provider for this visit: Treatment Team:  Attending Provider: Brand Males, MD    01/17/2022 -   Chief Complaint  Patient presents with   Follow-up    PFT performed today.  Pt states he has been doing okay since last visit.      HPI Lawrence Wells 66 y.o. -returns for follow-up.  He says he is doing well except for the fact that he still continues to be lean and underweight it.  He feels his cancer is under remission.  He is eating well.  He is quite active.  He is surprised that he is now off oxygen and doing well.  He is on Imuran and Plaquenil for his rheumatoid arthritis.  Is not on any antifibrotic.  He is surprised that he is alive and in fact doing well.  He  is also surprised that he is not on oxygen.  He is wondering if the chemotherapy for cancer actually help resolve his fibrosis [I could although listen to crackles and this pulm function test shows low DLCO].  He was supposed to have a high-resolution CT scan of the chest but our office sent the authorization to the wrong place.  Nevertheless he feels good.  His walking desaturation test is improved       OV 04/30/2022  Subjective:  Patient ID: Lawrence Wells, male , DOB: February 14, 1956 , age 89 y.o. , MRN: [id number] , ADDRESS: [place] [id number] PCP Olin Hauser, DO Patient Care Team: Olin Hauser, DO as PCP - General (Family Medicine)  This Provider for this visit: Treatment Team:  Attending Provider: Brand Males, MD   04/30/2022 -   Chief Complaint  Patient presents with   Follow-up    PFT  performed today.  Pt states he has been coughing up some chunks of phlegm. States otherwise he has been doing okay since last visit.     HPI Lawrence Wells 66 y.o. -returns for follow-up.  His symptom scores are stable.  He had pulmonary function test.  Compared to the summer 2023 today the FVC and DLCO are stable without change.  We did look at January 2021 pulmonary function test.  Paradoxically the DLCO is down while the FVC is up.  It is unclear why.  Overall he feels better.  He feels that the cancer chemotherapy really helped him.  Nevertheless on the latest high-resolution CT scan of the chest Dr. Rosario Jacks radiologist feels he has classic UIP pattern [personally visualized and agree] in addition she also feels it is progressive compared to 2021.  I went over this with him.  Did indicate to him UIP is a marker of progression in the future.  We discussed antifibrotic's.  Previously did not tolerate nintedanib.  Did explain to him that pirfenidone has similar GI side effect such as nausea weight loss anorexia and  occasionally diarrhea.  However told him  that some patients who did not tolerate nintedanib are able to tolerate pirfenidone.  Did indicate to him that given his low weight we would do the low-dose to minimize the risk of side effects but antifibrotic would be beneficial.  1 benefit with pirfenidone is that its not immunomodulating agent.  Based on all this we took a shared decision making to start pirfenidone at the lower dose of 2 pills 3 times daily.  Last blood work was in 2021 and we will repeat that today.  He has some grade 1 diastolic dysfunction and emphysema as well.  The pulmonary artery is enlarged on the CT scan but right ventricular function is normal on this echo       OV 07/30/2022  Subjective:  Patient ID: Lawrence Wells, male , DOB: 1955-10-09 , age 36 y.o. , MRN: [id number] , ADDRESS: 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 States that he still has complaints of a cough and is coughing up a lot of phlegm that is dark in color.     HPI Lawrence Wells 66 y.o. -returns for follow-up.  Last visit in September 2023 because of concern of progression in his ILD started low-dose pirfenidone.  He states he is tolerating this well.  However he is reporting a lot of cough.  He has been going on several weeks.  He seen Dr. Jenetta Downer in our office was given antibiotics and prednisone and this seemed to help but then it started recurring.  Infections been going on for a month or so.  He says prednisone and albuterol have helped him.  He is complaining of dark sputum is been going on for a few weeks.  He says the cough is really bad.  Symptom score also shows worsening and fatigue.  He does have underlying emphysema.  Last visit I did indicate to him that he would need to be on Spiriva but we ended up starting pirfenidone and we are holding off on starting Spiriva till later.  He continues to be on Imuran for his rheumatoid arthritis.  He did have a chest x-ray that I reviewed with him.  Shows chronic ILD changes.  This was when he visited Dr. Jenetta Downer acutely.   SYMPTOM SCALE - ILD 12/02/2019 On ofev, immuran, rehab  wegt 163# 159# 01/06/2020  03/07/2020 150#, ofev, immuran, pred 7.5 and 4L [place] iwt with exertion.  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 Not on ofev  01/17/2022 131# 04/30/2022 On immuran. Not on antifibrotic Start low dose esbriet 07/30/2022 Immuran and low dose esbrit - 135#  O2 use ra  ra ra ra ra ra ra  Shortness of Breath 0 -> 5 scale with 5 being worst (score 6 If unable to do)         At rest 0  0 0 0 0 0 1  Simple tasks - showers, clothes change, eating, shaving 3  5 0 0 0 1 1  Household (dishes, doing bed,  laundry) 3  5 0 0 '1 1 1  '$ Shopping 4  5 0 0 '1 2 2  '$ Walking level at own pace 3.'5  5 1 '$ 0 0 1 1  Walking up Stairs '4 4 6 1 1 1 2 2  '$ Total (30-36) Dyspnea Score '18  26 2 1 3 7 8  '$ How bad is your cough? '3 2 5 '$ 0 0 0 1 2  How bad is your fatigue '3  4 2 1 '$ 0 0 5  How bad is nausea 0  4 0 0 0 0 0  How bad is vomiting?  0  0 0 0 0 0 0  How bad is diarrhea? 0 4 despite immodium 1 per day 2 0 0 0 0 0  How bad is anxiety? '2  2 5 '$ 0 0 0 0  How bad is depression 2  3 0 0 0 0 2  pain  2 0  0 0 0         Simple office walk 185 feet x  3 laps goal with forehead probe 09/10/2019  10/22/2019  .03/07/2020   08/04/2020  12/13/2020  01/17/2022  07/30/2022    O2 used ra ra - walk, uses 2L portable at home with exerion ra ra  ra ra  Number laps completed '3 3 3 '$ attempted but did only 1/2 Did all 3  Did all 3 laps Did all 3 las  Comments about pace mod     Good pace Avg pace  Resting Pulse Ox/HR 98% and 74/min 95% and 71 99% and 85/min 95% and HR 91/min 99% and HR 63 100% and 56/min 97% ad HR 71  Final Pulse Ox/HR 88% and 96/min 86% and 101 88% a and 97/mi 89% and 107% 86% and HR 81 96% and 72 bpm 91% and HR 85  Desaturated </= 88% yes yes       Desaturated <= 3% points Yes, 10 points Yes, 9 pon       Got Tachycardic >/= 90/min yes yes       Symptoms at end of test Yes "pretty heavy" yes       Miscellaneous comments Corrected 2L   Corrected with 4L   Refused o3 Improved Dropped 6 points     PFT     Latest Ref Rng & Units 04/30/2022    9:04 AM 01/17/2022    3:41 PM  PFT Results  FVC-Pre L 4.01  4.08   FVC-Predicted Pre % 89  86   Pre FEV1/FVC % % 81  82   FEV1-Pre L  3.23  3.33   FEV1-Predicted Pre % 97  95   DLCO uncorrected ml/min/mmHg 10.43  9.05   DLCO UNC% % 39  33   DLCO corrected ml/min/mmHg 10.62  9.24   DLCO COR %Predicted % 40  33   DLVA Predicted % 49  41     Latest Reference Range & Units 05/07/17 10:15 09/02/19 08:36 09/10/19 12:58 11/18/19 10:30 01/20/20 05:56 01/21/20 08:18  01/22/20 04:54 04/30/22 12:15  Eosinophils Absolute 0.0 - 0.7 K/uL 238 283 0.2 215 0.0 0.0 0.0 0.1      has a past medical history of Arthritis, Collagen vascular disease ([place]), COPD (chronic obstructive pulmonary disease) ([place]), Coronary artery disease, Dyspnea, Emphysema lung ([place]), Hyperlipidemia, and 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[place]).   reports that he quit smoking about 16 years ago. His smoking use included cigarettes. He has a 45.00 pack-year smoking history. He quit smokeless tobacco use about 16 years ago.  His smokeless tobacco use included chew.  Past Surgical History:  Procedure Laterality Date   SINUS SURGERY WITH INSTATRAK      Allergies  Allergen Reactions   Atorvastatin Other (See Comments)    Myalgias    Immunization History  Administered Date(s) Administered   Fluad Quad(high Dose 65+) 08/10/2021, 04/30/2022   Influenza, High Dose Seasonal PF 07/28/2020   PFIZER(Purple Top)SARS-COV-2 Vaccination 11/22/2019, 12/15/2019   Pneumococcal Conjugate-13 09/08/2019   Tdap 04/19/2015, 05/07/2017   Zoster Recombinat (Shingrix) 09/29/2019, 11/27/2019    Family History  Problem Relation Age of Onset   Heart disease Mother    Heart attack Mother 99   Arthritis Mother    Heart disease Father    Heart attack Father 76   Healthy Sister    Hyperlipidemia Brother    Throat cancer Brother 31   Heart disease Brother    Healthy Son    Healthy Daughter      Current Outpatient Medications:    azaTHIOprine (IMURAN) 50 MG tablet, Take 2 tablets (100 mg total) by mouth daily., Disp: 180 tablet, Rfl: 0   azithromycin (ZITHROMAX) 250 MG tablet, Take two today and then one daily until finished., Disp: 6 tablet, Rfl: 0   ezetimibe (ZETIA) 10 MG tablet, Take 1 tablet (10 mg total) by mouth daily., Disp: 90 tablet, Rfl: 3   hydroxychloroquine (PLAQUENIL) 200 MG tablet, Take 1 tablet by mouth daily., Disp: , Rfl:    meclizine (ANTIVERT) 25 MG tablet, Take 1 tablet (25 mg total) by mouth 3  (three) times daily as needed for dizziness., Disp: 30 tablet, Rfl: 2   Multiple Vitamins-Minerals (CENTRUM SILVER 50+MEN PO), Take by mouth daily., Disp: , Rfl:    Pirfenidone (ESBRIET) 267 MG TABS, Take 2 tablets (534 mg total) by mouth with breakfast, with lunch, and with evening meal., Disp: 180 tablet, Rfl: 4   predniSONE (DELTASONE) 10 MG tablet, Take 4 tablets (40 mg total) by mouth daily with breakfast for 3 days, THEN 3 tablets (30 mg total) daily with breakfast for 3 days, THEN 2 tablets (20 mg total) daily with breakfast for 3 days, THEN 1 tablet (10 mg total) daily with breakfast for 3 days., Disp: 30 tablet, Rfl: 0   rizatriptan (MAXALT-MLT) 10 MG disintegrating tablet, Take 1 tablet (10 mg total) by mouth as needed for migraine. May repeat in 2 hours if needed, Disp: 10 tablet, Rfl: 0   tobramycin-dexamethasone (TOBRADEX) ophthalmic solution, Place into the left eye., Disp: , Rfl:    Tiotropium Bromide Monohydrate (SPIRIVA RESPIMAT) 2.5 MCG/ACT AERS,  Inhale 2 puffs into the lungs daily., Disp: 4 g, Rfl: 5      Objective:   Vitals:   07/30/22 0851  BP: 110/70  Pulse: 71  SpO2: 97%  Weight: 135 lb (61.2 kg)  Height: '5\' 11"'$  (1.803 m)    Estimated body mass index is 18.83 kg/m as calculated from the following:   Height as of this encounter: '5\' 11"'$  (1.803 m).   Weight as of this encounter: 135 lb (61.2 kg).  '@WEIGHTCHANGE'$ @  Filed Weights   07/30/22 0851  Weight: 135 lb (61.2 kg)     Physical Exam General: No distress. think Neuro: Alert and Oriented x 3. GCS 15. Speech normal Psych: Pleasant Resp:  Barrel Chest - no.  Wheeze - no, Crackles - no, No overt respiratory distress CVS: Normal heart sounds. Murmurs - no Ext: Stigmata of Connective Wells Disease - n HEENT: Normal upper airway. PEERL +. No post nasal drip        Assessment:       ICD-10-CM   1. COPD with acute exacerbation ([place])  J44.1     2. Centrilobular emphysema ([place])  J43.2     3.  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 Medication monitoring encounter  Z51.81      At this point in time we will probably treat him a COPD exacerbation and treat his COPD part because this was simple approach    Plan:     Patient Instructions     ICD-10-CM   1. COPD with acute exacerbation ([place])  J44.1     2. Centrilobular emphysema ([place])  J43.2     3. 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 Medication monitoring encounter  Z51.81        Disease slowly progressive on CT chest over 2 years though clinically you are stable and stable on PFT June -> Sept 2023  The UIP pattern of fibrosis marks future risk for progression as well  Prior intolerance to ofev. Currently on low dose esbriet and tolerating it well   Lot cough recently could be due to COPD and copd flare up that is not resolving  - time to start COPD treatment  Plan - check LFT 07/30/2022   - Please take Take prednisone '40mg'$  once daily x 3 days, then '30mg'$  once daily x 3 days, then '20mg'$  once daily x 3 days, then prednisone '10mg'$  once daily  x 3 days and stop  - Z pak   - Please start spiriva respimat high dose 2 puff daily - take sample, script and show technique   -- contninue  pirfenidone/esbriet per protocol  - max is low dose of 2 pills three times daily  - take 5-6h apart with food, appply sunscreen and drink water a lot  - do spirometry and dlco in 8-12 weks  Followup - 8-12 weeks with DR Chase Caller;  Dr Chase Caller - onsite visit - 30 min   - symptoms score and walk test at followuo  High complex medical condition requiring high risk prescription with intensive therapeutic monitoring requirement   SIGNATURE    Dr. Brand Males, M.D., F.C.C.P,  Pulmonary and Critical Care Medicine Staff Physician, [place]  Director - Interstitial Lung Disease  Program  [facility] [employer], [place], [address]  Pager: [phone], If no answer or between  15:00h - 7:00h: call [phone] Telephone: [phone]  6:14 PM 07/30/2022

## 2022-07-30 NOTE — Patient Instructions (Addendum)
ICD-10-CM   1. COPD with acute exacerbation ([place])  J44.1     2. Centrilobular emphysema ([place])  J43.2     3. Interstitial lung disease due to connective tissue disease ([place])  J84.89    M35.9     4. Medication monitoring encounter  Z51.81        Disease slowly progressive on CT chest over 2 years though clinically you are stable and stable on PFT June -> Sept 2023  The UIP pattern of fibrosis marks future risk for progression as well  Prior intolerance to ofev. Currently on low dose esbriet and tolerating it well   Lot cough recently could be due to COPD and copd flare up that is not resolving  - time to start COPD treatment  Plan - check LFT 07/30/2022   - Please take Take prednisone '40mg'$  once daily x 3 days, then '30mg'$  once daily x 3 days, then '20mg'$  once daily x 3 days, then prednisone '10mg'$  once daily  x 3 days and stop  - Z pak   - Please start spiriva respimat high dose 2 puff daily - take sample, script and show technique   -- contninue  pirfenidone/esbriet per protocol  - max is low dose of 2 pills three times daily  - take 5-6h apart with food, appply sunscreen and drink water a lot  - do spirometry and dlco in 8-12 weks  Followup - 8-12 weeks with DR Chase Caller;  Dr Chase Caller - onsite visit - 30 min   - symptoms score and walk test at followuo

## 2022-07-31 ENCOUNTER — Encounter: Payer: Medicare PPO | Admitting: Speech Pathology

## 2022-08-02 ENCOUNTER — Encounter: Payer: Medicare PPO | Admitting: Speech Pathology

## 2022-08-02 ENCOUNTER — Telehealth: Payer: Self-pay | Admitting: Pharmacist

## 2022-08-02 NOTE — Telephone Encounter (Signed)
Per [place], [place] for pirfenidone set to expire. Submitted a Prior Authorization RENEWAL request to [facility] for PIRFENIDONE via CoverMyMeds. Will update once we receive a response.  Key: [id number]  Knox Saliva, PharmD, MPH, BCPS, CPP Clinical Pharmacist (Rheumatology and Pulmonology)

## 2022-08-02 NOTE — Telephone Encounter (Signed)
Received notification from [facility] regarding a prior authorization for PIRFENIDONE. Authorization has been APPROVED from 08/19/2021 to 08/19/2023. Approval letter sent to scan center.  Patient can continue to fill through [place]: [phone]   Authorization # [id number]  Knox Saliva, PharmD, MPH, BCPS, CPP Clinical Pharmacist (Rheumatology and Pulmonology)

## 2022-08-07 ENCOUNTER — Encounter: Payer: Medicare PPO | Admitting: Speech Pathology

## 2022-08-08 ENCOUNTER — Other Ambulatory Visit: Payer: Self-pay | Admitting: Family Medicine

## 2022-08-08 DIAGNOSIS — Z20828 Contact with and (suspected) exposure to other viral communicable diseases: Secondary | ICD-10-CM

## 2022-08-08 MED ORDER — OSELTAMIVIR PHOSPHATE 75 MG PO CAPS: 75.0000 mg | ORAL_CAPSULE | Freq: Every day | ORAL | 0 refills | Status: DC

## 2022-08-09 ENCOUNTER — Encounter: Payer: Medicare PPO | Admitting: Speech Pathology

## 2022-08-14 ENCOUNTER — Encounter: Payer: Medicare PPO | Admitting: Speech Pathology

## 2022-08-14 DIAGNOSIS — J849 Interstitial pulmonary disease, unspecified: Secondary | ICD-10-CM | POA: Diagnosis not present

## 2022-08-14 DIAGNOSIS — Z79899 Other long term (current) drug therapy: Secondary | ICD-10-CM | POA: Diagnosis not present

## 2022-08-14 DIAGNOSIS — M0579 Rheumatoid arthritis with rheumatoid factor of multiple sites without organ or systems involvement: Secondary | ICD-10-CM | POA: Diagnosis not present

## 2022-08-16 ENCOUNTER — Encounter: Payer: Medicare PPO | Admitting: Speech Pathology

## 2022-08-23 ENCOUNTER — Encounter: Payer: Self-pay | Admitting: Family Medicine

## 2022-08-23 ENCOUNTER — Ambulatory Visit (INDEPENDENT_AMBULATORY_CARE_PROVIDER_SITE_OTHER): Payer: Medicare Other | Admitting: Family Medicine

## 2022-08-23 ENCOUNTER — Ambulatory Visit: Payer: Self-pay

## 2022-08-23 VITALS — BP 120/70 | Ht 71.0 in | Wt 133.0 lb

## 2022-08-23 DIAGNOSIS — J0110 Acute frontal sinusitis, unspecified: Secondary | ICD-10-CM

## 2022-08-23 DIAGNOSIS — N401 Enlarged prostate with lower urinary tract symptoms: Secondary | ICD-10-CM | POA: Diagnosis not present

## 2022-08-23 DIAGNOSIS — R351 Nocturia: Secondary | ICD-10-CM | POA: Diagnosis not present

## 2022-08-23 MED ORDER — SAW PALMETTO (SERENOA REPENS) 160 MG PO CAPS: 160.0000 mg | ORAL_CAPSULE | Freq: Two times a day (BID) | ORAL | Status: DC

## 2022-08-23 MED ORDER — PREDNISONE 20 MG PO TABS: ORAL_TABLET | ORAL | 0 refills | Status: DC

## 2022-08-23 MED ORDER — AMOXICILLIN-POT CLAVULANATE 875-125 MG PO TABS: 1.0000 | ORAL_TABLET | Freq: Two times a day (BID) | ORAL | 0 refills | Status: DC

## 2022-08-23 NOTE — Telephone Encounter (Signed)
Pt has stopped up head, sinuses for 3 days.   Chief Complaint: Sinus' "blockes, pressure." Asking to be worked in today. Symptoms: Above Frequency: 3 days ago Pertinent Negatives: Patient denies fever Disposition: '[]'$ ED /'[]'$ Urgent Care (no appt availability in office) / '[]'$ Appointment(In office/virtual)/ '[]'$  [place]/ '[]'$ Home Care/ '[]'$ Refused Recommended Disposition /'[]'$ [place]/ '[x]'$  Follow-up with PCP Additional Notes: Has appointment Monday, but asking to be worked in today. Please advise pt.  Reason for Disposition  [1] Using nasal washes and pain medicine > 24 hours AND [2] sinus pain (around cheekbone or eye) persists  Answer Assessment - Initial Assessment Questions 1. LOCATION: "Where does it hurt?"      Face  2. ONSET: "When did the sinus pain start?"  (e.g., hours, days)      3 days ago 3. SEVERITY: "How bad is the pain?"   (Scale 1-10; mild, moderate or severe)   - MILD (1-3): doesn't interfere with normal activities    - MODERATE (4-7): interferes with normal activities (e.g., work or school) or awakens from sleep   - SEVERE (8-10): excruciating pain and patient unable to do any normal activities        Mild 4. RECURRENT SYMPTOM: "Have you ever had sinus problems before?" If Yes, ask: "When was the last time?" and "What happened that time?"      Yes 5. NASAL CONGESTION: "Is the nose blocked?" If Yes, ask: "Can you open it or must you breathe through your mouth?"     Blocked 6. NASAL DISCHARGE: "Do you have discharge from your nose?" If so ask, "What color?"     No 7. FEVER: "Do you have a fever?" If Yes, ask: "What is it, how was it measured, and when did it start?"      No 8. OTHER SYMPTOMS: "Do you have any other symptoms?" (e.g., sore throat, cough, earache, difficulty breathing)     Cough 9. PREGNANCY: "Is there any chance you are pregnant?" "When was your last menstrual period?"     N/a  Protocols used: Sinus Pain or Congestion-A-AH

## 2022-08-23 NOTE — Patient Instructions (Addendum)
Thank you for coming to the office today.  Recommend talk to the pharmacist ask for original Sudafed 4-6 hr relief as needed. INSTEAD of the Alka seltzer plus  Continue Flonase, 2 sprays each side daily.  I would hesitate to add back any additional Steroid at this time. Lungs are clear.  The zpak antibiotic from 3 weeks ago is more lower respiratory  I will order back up plan ONLY - if you have worsening and not improving 48-72 more hours, you can try the Augmentin antibiotic 7 days and Prednisone taper.  For enlarged prostate causing your urinary symptoms - try Herbal Supplement - Saw Palmetto extract '160mg'$  twice a day - if this doesn't work then we can try rx Flomax. Usually it helps you urinate better and empty the bladder to avoid symptoms  Avoid bladder irritants, coffee, tea, soda, caffeine.  Please schedule a Follow-up Appointment to: Return if symptoms worsen or fail to improve.  If you have any other questions or concerns, please feel free to call the office or send a message through [place]. You may also schedule an earlier appointment if necessary.  Additionally, you may be receiving a survey about your experience at our office within a few days to 1 week by e-mail or mail. We value your feedback.  Nobie Putnam, DO [place]

## 2022-08-23 NOTE — Progress Notes (Signed)
Subjective:    Patient ID: Lawrence Wells, male    DOB: 30-Aug-1955, 67 y.o.   MRN: [id number]  Lawrence Wells is a 68 y.o. male presenting on 08/23/2022 for Sinusitis and Nasal Congestion   HPI  Sinusitis Reports onset symptoms 1 week approximately, with persistent sinus drainage congestion He has mild residual cough. But improved. He saw Dr Chase Caller at  Hospital 07/30/22 and they gave him Azithromycin taper and Prednisone and the Spiriva. - Using Flonase OTC 2 sprays once daily - Continues on Esbriet Admits headaches He has upcoming ENT specialist in 1-2 weeks.  BPH Nocturia Reports nocturia at night 3-4 times most nights. During day he has some episodic urgency, and some mild weaker urinary stream. Occasional hesitancy.       03/01/2022    9:51 AM 12/26/2021    4:07 PM 10/17/2021   11:10 AM  Depression screen PHQ 2/9  Decreased Interest 0 0 2  Down, Depressed, Hopeless 0 0 2  PHQ - 2 Score 0 0 4  Altered sleeping '1 2 1  '$ Tired, decreased energy '1 1 1  '$ Change in appetite 0 0 1  Feeling bad or failure about yourself  0 1 2  Trouble concentrating 0 0 1  Moving slowly or fidgety/restless 0 1 0  Suicidal thoughts 0 1 1  PHQ-9 Score '2 6 11  '$ Difficult doing work/chores Not difficult at all Somewhat difficult Somewhat difficult    Social History   Tobacco Use   Smoking status: Former    Packs/day: 1.50    Years: 30.00    Total pack years: 45.00    Types: Cigarettes    Quit date: 2007    Years since quitting: 17.0   Smokeless tobacco: Former    Types: Chew    Quit date: 2007   Tobacco comments:    Dip smokeless tobacco >20-30 years  Vaping Use   Vaping Use: Never used  Substance Use Topics   Alcohol use: Not Currently    Comment: occ   Drug use: No    Review of Systems Per HPI unless specifically indicated above     Objective:    BP 120/70   Ht '5\' 11"'$  (1.803 m)   Wt 133 lb (60.3 kg)   BMI 18.55 kg/m   Wt Readings from Last 3 Encounters:   08/23/22 133 lb (60.3 kg)  07/30/22 135 lb (61.2 kg)  07/09/22 130 lb 3.2 oz (59.1 kg)    Physical Exam Vitals and nursing note reviewed.  Constitutional:      General: He is not in acute distress.    Appearance: He is well-developed. He is not diaphoretic.     Comments: Well-appearing, comfortable, cooperative  HENT:     Head: Normocephalic and atraumatic.     Nose: Congestion present.     Comments: Turbinate edema significantly Eyes:     General:        Right eye: No discharge.        Left eye: No discharge.     Conjunctiva/sclera: Conjunctivae normal.  Neck:     Thyroid: No thyromegaly.  Cardiovascular:     Rate and Rhythm: Normal rate and regular rhythm.     Pulses: Normal pulses.     Heart sounds: Normal heart sounds. No murmur heard. Pulmonary:     Effort: Pulmonary effort is normal. No respiratory distress.     Breath sounds: Normal breath sounds. No wheezing or rales.  Musculoskeletal:  General: Normal range of motion.     Cervical back: Normal range of motion and neck supple.  Lymphadenopathy:     Cervical: No cervical adenopathy.  Skin:    General: Skin is warm and dry.     Findings: No erythema or rash.  Neurological:     Mental Status: He is alert and oriented to person, place, and time. Mental status is at baseline.  Psychiatric:        Behavior: Behavior normal.     Comments: Well groomed, good eye contact, normal speech and thoughts    Results for orders placed or performed in visit on 07/30/22  Hepatic function panel  Result Value Ref Range   Total Bilirubin 0.3 0.2 - 1.2 mg/dL   Bilirubin, Direct 0.0 0.0 - 0.3 mg/dL   Alkaline Phosphatase 83 39 - 117 U/L   AST 19 0 - 37 U/L   ALT 8 0 - 53 U/L   Total Protein 6.9 6.0 - 8.3 g/dL   Albumin 3.9 3.5 - 5.2 g/dL      Assessment & Plan:   Problem List Items Addressed This Visit   None Visit Diagnoses     Acute non-recurrent frontal sinusitis    -  Primary   Relevant Medications    amoxicillin-clavulanate (AUGMENTIN) 875-125 MG tablet   predniSONE (DELTASONE) 20 MG tablet   BPH associated with nocturia       Relevant Medications   saw palmetto 160 MG capsule       Consistent with acute frontal vs maxillary rhinosinusitis, likely initially viral URI vs allergic rhinitis component Uncertain if actual bacterial infection at this time, seems more sinusitis and pressure.  Plan: 1. Reassurance, likely self-limited - no indication for antibiotics at this time - Start nasal steroid Flonase 2 sprays in each nostril daily for 4-6 weeks, may repeat course seasonally or as needed - Sudafed behind the counter - IF NOT IMPROVING or worsening sign of infection - back up plan May  Start Augmentin 875-'125mg'$  PO BID x 10 days - May start Prednisone  Return criteria reviewed   BPH Nocturia / LUTS Some daytime symptoms worse nocturia Never on medication prior PSA negative Trial on Saw Palmetto 160 TWICE A DAY herbal remedy If not improving can rx Tamsulosin alpha blocker  Meds ordered this encounter  Medications   amoxicillin-clavulanate (AUGMENTIN) 875-125 MG tablet    Sig: Take 1 tablet by mouth 2 (two) times daily.    Dispense:  14 tablet    Refill:  0   predniSONE (DELTASONE) 20 MG tablet    Sig: Take daily with food. Start with '60mg'$  (3 pills) x 2 days, then reduce to '40mg'$  (2 pills) x 2 days, then '20mg'$  (1 pill) x 3 days    Dispense:  13 tablet    Refill:  0   saw palmetto 160 MG capsule    Sig: Take 1 capsule (160 mg total) by mouth 2 (two) times daily.      Follow up plan: Return if symptoms worsen or fail to improve.   Nobie Putnam, [facility] 08/23/2022, 2:50 PM

## 2022-08-23 NOTE — Telephone Encounter (Signed)
Has a same day cancellation and worked pt in on same day.

## 2022-08-24 ENCOUNTER — Encounter: Payer: Self-pay | Admitting: Family Medicine

## 2022-08-26 ENCOUNTER — Ambulatory Visit: Payer: Medicare PPO | Admitting: Family Medicine

## 2022-09-03 DIAGNOSIS — Z85818 Personal history of malignant neoplasm of other sites of lip, oral cavity, and pharynx: Secondary | ICD-10-CM | POA: Diagnosis not present

## 2022-09-03 DIAGNOSIS — R42 Dizziness and giddiness: Secondary | ICD-10-CM | POA: Diagnosis not present

## 2022-09-03 DIAGNOSIS — H9122 Sudden idiopathic hearing loss, left ear: Secondary | ICD-10-CM | POA: Diagnosis not present

## 2022-09-03 DIAGNOSIS — R1314 Dysphagia, pharyngoesophageal phase: Secondary | ICD-10-CM | POA: Diagnosis not present

## 2022-09-20 ENCOUNTER — Other Ambulatory Visit (HOSPITAL_COMMUNITY): Payer: Self-pay

## 2022-09-20 ENCOUNTER — Telehealth: Payer: Self-pay | Admitting: Pharmacist

## 2022-09-20 DIAGNOSIS — J849 Interstitial pulmonary disease, unspecified: Secondary | ICD-10-CM

## 2022-09-20 NOTE — Telephone Encounter (Signed)
Received message from [facility] that patient's copay has now increased to $453.89 for generic pirfenidone. He also appears to have to switch pharmacies to [facility] now. There is no patient assistance for generic pirfenidone. Additionally, it is likely that patient's medication cost will decrease to $0 once in catastrophic coverage.   Knox Saliva, PharmD, MPH, BCPS, CPP Clinical Pharmacist (Rheumatology and Pulmonology)

## 2022-09-23 ENCOUNTER — Other Ambulatory Visit (HOSPITAL_COMMUNITY): Payer: Self-pay

## 2022-09-27 ENCOUNTER — Other Ambulatory Visit (HOSPITAL_COMMUNITY): Payer: Self-pay

## 2022-09-27 NOTE — Telephone Encounter (Signed)
Clinical questions completed for [place] for pirfenidone on Cover My Meds  Key: [id number]  Knox Saliva, PharmD, MPH, BCPS, CPP Clinical Pharmacist (Rheumatology and Pulmonology)

## 2022-09-27 NOTE — Telephone Encounter (Signed)
Called patient regarding pirfenidone. Patient states he switched from [facility] to [place]. Copay has increased both due to new year and likely change in insurance. Patient states he is unwilling to pay >$400 per month. I advised that this is unlikely as there have been reductions to what patients pay. Advised that there are no grants open right now.   Patient has a Haemat[employer] health plan that is NOT coordinated with his Medicare. Discussed that if benefits are not coordinated there is option to fill using his retirement plan and utilize copay card. I did ask why his retirement plan was not converted to [facility] plan but he states that he was told it could not be.   Submitted a Prior Authorization request to [facility] for PIRFENIDONE via CoverMyMeds. Pending clinical questions to populate  Key: [id number]  Once approved, he'll have to fill with [facility].   Patient would like to further discuss at [place] with Dr. Chase Caller on 10/11/2022 - I reviewed that I work directly with him and pharmacy team is trying to be proactive to prevent interruption in treatment. Patient states he has a couple weeks of medication right now  Knox Saliva, PharmD, MPH, BCPS, CPP Clinical Pharmacist (Rheumatology and Pulmonology)

## 2022-10-02 ENCOUNTER — Telehealth: Payer: Self-pay | Admitting: Internal Medicine

## 2022-10-02 NOTE — Telephone Encounter (Signed)
Dr. Chase Caller request pt have PFT & ov 8-12 weeks from last visit. pt is currently scheduled for OV but we are not able to add him on for a PFT due to the crisis. His last PFT was in January 2021. I wanted to see if we should just cancel OV or would you like to still see the patient ?   Routing to both Triage & Dr. Chase Caller.. Dr.Ramaswamy status is currently offline in epic until 10/08/22 so I was unable to send a direct message. However we need to contact patient ASAP to adjust appointment.

## 2022-10-02 NOTE — Telephone Encounter (Signed)
Dr. Silas Flood can you please advise? Dr. Chase Caller wont be available until next week. Since patient wont be able to complete PFT do you recommend he still be seen for appointment.

## 2022-10-02 NOTE — Telephone Encounter (Signed)
Think it is worth keeping the appointment since he did change therapies, added inhalers at last visit.  Worth checking in as well with his ongoing use of antifibrotics.

## 2022-10-02 NOTE — Telephone Encounter (Signed)
Called and spoke with pt letting him know the info per [place] and he verbalized understanding. Nothing further needed.

## 2022-10-09 ENCOUNTER — Other Ambulatory Visit (HOSPITAL_COMMUNITY): Payer: Self-pay

## 2022-10-09 MED ORDER — PIRFENIDONE 267 MG PO TABS: 534.0000 mg | ORAL_TABLET | Freq: Three times a day (TID) | ORAL | 4 refills | Status: DC

## 2022-10-09 NOTE — Telephone Encounter (Signed)
Received notification from [facility] regarding a prior authorization for PIRFENIDONE. Authorization has been APPROVED from 09/27/2022 to 09/27/2023. Approval letter sent to scan center.  Unable to run test claim because patient must fill through [facility]: [phone]. He would qualify for a copay card if available through whatever manufacturer the specialty pharmacy is using.  Authorization # [id number]  I spoke with patient regarding this approval. He is okay with sending rx there and seeing if copay is affordable through that plan. I did advise that we will have to get him on the phone because the pharmacy will not disclose copay information to anyone but the patient.  Knox Saliva, PharmD, MPH, BCPS, CPP Clinical Pharmacist (Rheumatology and Pulmonology)

## 2022-10-11 ENCOUNTER — Ambulatory Visit: Payer: Medicare PPO | Admitting: Internal Medicine

## 2022-10-11 ENCOUNTER — Encounter: Payer: Medicare PPO | Admitting: Internal Medicine

## 2022-10-17 ENCOUNTER — Other Ambulatory Visit (HOSPITAL_COMMUNITY): Payer: Self-pay

## 2022-10-21 ENCOUNTER — Ambulatory Visit (INDEPENDENT_AMBULATORY_CARE_PROVIDER_SITE_OTHER): Payer: Medicare Other | Admitting: Internal Medicine

## 2022-10-21 ENCOUNTER — Encounter: Payer: Self-pay | Admitting: Internal Medicine

## 2022-10-21 ENCOUNTER — Telehealth: Payer: Self-pay | Admitting: Internal Medicine

## 2022-10-21 VITALS — BP 106/68 | HR 73 | Temp 97.9°F | Ht 71.0 in | Wt 136.0 lb

## 2022-10-21 DIAGNOSIS — M359 Systemic involvement of connective tissue, unspecified: Secondary | ICD-10-CM | POA: Diagnosis not present

## 2022-10-21 DIAGNOSIS — J441 Chronic obstructive pulmonary disease with (acute) exacerbation: Secondary | ICD-10-CM

## 2022-10-21 DIAGNOSIS — Z5181 Encounter for therapeutic drug level monitoring: Secondary | ICD-10-CM | POA: Diagnosis not present

## 2022-10-21 DIAGNOSIS — J8489 Other specified interstitial pulmonary diseases: Secondary | ICD-10-CM

## 2022-10-21 DIAGNOSIS — J432 Centrilobular emphysema: Secondary | ICD-10-CM

## 2022-10-21 DIAGNOSIS — Z596 Low income: Secondary | ICD-10-CM

## 2022-10-21 DIAGNOSIS — M0579 Rheumatoid arthritis with rheumatoid factor of multiple sites without organ or systems involvement: Secondary | ICD-10-CM | POA: Diagnosis not present

## 2022-10-21 DIAGNOSIS — Z5986 Financial insecurity: Secondary | ICD-10-CM

## 2022-10-21 LAB — PULMONARY FUNCTION TEST
DL/VA % pred: 32 %
DL/VA: 1.33 ml/min/mmHg/L
DLCO cor % pred: 29 %
DLCO cor: 7.85 ml/min/mmHg
DLCO unc % pred: 29 %
DLCO unc: 7.85 ml/min/mmHg
FEF 25-75 Pre: 1.82 L/sec
FEF2575-%Pred-Pre: 70 %
FEV1-%Pred-Pre: 87 %
FEV1-Pre: 2.92 L
FEV1FVC-%Pred-Pre: 98 %
FEV6-%Pred-Pre: 93 %
FEV6-Pre: 3.97 L
FEV6FVC-%Pred-Pre: 104 %
FVC-%Pred-Pre: 89 %
FVC-Pre: 4.01 L
Pre FEV1/FVC ratio: 73 %
Pre FEV6/FVC Ratio: 99 %

## 2022-10-21 MED ORDER — DOXYCYCLINE HYCLATE 100 MG PO TABS: 100.0000 mg | ORAL_TABLET | Freq: Two times a day (BID) | ORAL | 0 refills | Status: DC

## 2022-10-21 MED ORDER — PREDNISONE 10 MG PO TABS: ORAL_TABLET | ORAL | 0 refills | Status: DC

## 2022-10-21 NOTE — Patient Instructions (Signed)
Spiro and DLCO performed today. 

## 2022-10-21 NOTE — Patient Instructions (Addendum)
ICD-10-CM   1. Interstitial lung disease due to connective tissue disease ([place])  J84.89    M35.9     2. Rheumatoid arthritis involving multiple sites with positive rheumatoid factor (HCC)  M05.79     3. COPD with acute exacerbation ([place])  J44.1     4. Centrilobular emphysema ([place])  J43.2     5. Medication monitoring encounter  Z51.81     6. Patient cannot afford medications  Z59.6       Likely in another copd flare up Also concerned for disease progression or developing pulmonary hypertension Understand severe frustration with Co-pay of $450 for esbreit via [facility] the PBM   Plan - respect o2 deferral - check LFT, bnp 07/30/2022  - check quantiferon gold 10/21/2022 - Take doxycycline '100mg'$  po twice daily x 5 days; take after meals and avoid sunlight - Take prednisone 40 mg daily x 2 days, then '20mg'$  daily x 2 days, then '10mg'$  daily x 2 days, then '5mg'$  daily x 2 days and stop -  Please continue spiriva respimat high dose 2 puff daily - take sample, script and show technique - Contninue  pirfenidone/esbriet per protocol  - max is low dose of 2 pills three times daily  - take 5-6h apart with food, appply sunscreen and drink water a lot  - sent message to our pharmacy team to address $ issues ASAP  - do HRCT in May 2024 (1 year)  Followup - may 2024 with  DR Chase Caller; - onsite visit - 30 min   - symptom sscore and walk test  - symptoms score and walk test at followup

## 2022-10-21 NOTE — Progress Notes (Signed)
OV 09/10/2019  Subjective:  Patient ID: Lawrence Wells, male , DOB: 07-20-1956 , age 67 y.o. , MRN: [id number] , ADDRESS: [place] [id number]   09/10/2019 -   Chief Complaint  Patient presents with   pulmonary consult    per Dr. Parks Ranger- CXR 09/08/2019. pt reports of sob with exertion, talking and bending, prod cough with yellow mucus, left sided chest discomfort  and wheezing.     HPI EDRIAN Wells 67 y.o. -referred for interstitial lung disease after a chest x-ray from September 08, 2019 that I personally visualized and interpreted shows diffuse ILD along with left upper lobe nodule.  Patient has wife tell me that he has had insidious onset of shortness of breath for a year with progression in the last few months.  They also tell me that few years ago he had an chest x-ray done for an incidental unrelated reason that showed chronic changes of scarring but apparently were reassured.  In review of his chart and noticed that in 2005 this is chest x-ray done in the [facility] system with reports of ILD in it.  He is unaware of that.  History for this visit is given by the patient and his wife and review of the chart. [facility] Comprehensive ILD Questionnaire  Symptoms:   Insidious onset of shortness of breath for the last year getting worse in the last 3 to 4 months.  Definitely no dyspnea a few years ago even though chest x-ray from 2005 was reported as ILD.  Sometimes dyspnea is episodic but mostly with exertion relieved by rest.  Symptom severity is listed below.  There is associated significant cough.  Cough started in October 2020.  It is getting worse cough is rated as moderate to severe.  He coughs at night.  He does bring up some yellow phlegm.  It is worse when he lies down.Marland Kitchen  He does clear his throat and he does feel a tickle in the back of the throat.  There is also associated wheezing.       Past Medical History : He gives a history  positive for arthritis.  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  Denies any asthma or known COPD.  Denies heart failure.  Denies scleroderma any collagen vascular disease.  Denies diagnosis of acid reflux or hiatal hernia.  Denies obstructive sleep apnea.  Denies pulmonary hypertension.  Denies diabetes or thyroid disease or stroke or seizures.  Denies infectious mononucleosis.  Denies hepatitis.  Denies tuberculosis denies kidney disease.  Denies blood clots denies heart disease denies pleurisy.   ROS: Positive for fatigue and arthralgia.  Positive for acid reflux.  He went to check the box for [name].  Denies any GI symptoms or rash or ulcers  FAMILY HISTORY of LUNG DISEASE: Denies including COPD and pulmonary fibrosis   EXPOSURE HISTORY: Smokes cigarettes between 1983 and 2007 1 to 2 packs/day and then quit.  Did not smoke cigars did not smoke pipe.  Did not vape.  Never smoked marijuana or cocaine.  Never used intravenous drugs.   HOME and HOBBY DETAILS : [place].  The age of the home is 20 years.  There is no dampness in the living environment.  No mold or mildew.  Does  not use humidifier.  No CPAP use.  Does not use nebulizer.  No steam iron use.  No Jacuzzi.  No pet birds or parakeets in the house.  No pet gerbils.  No feather pillows.  No mold in the [facility] duct.  Does not play wind instruments.  Does not do any gardening.   OCCUPATIONAL HISTORY (122 questions) : Works in the Transport planner for 30 years.  Worked in [employer] and crawl spaces.  A lot of dust exposure.  During this time he worked in damp air-conditioned spaces.  He thinks he had asbestos exposure.  He also worked in the [employer].  He cleaned AC coils.  Otherwise history is negative   PULMONARY TOXICITY HISTORY (27 items):  Entirely negative.       ROS - per HPI    OV 10/22/2019  Subjective:  Patient ID: Lawrence Wells, male , DOB: [birth year] , age 33 y.o. , MRN: [id number] , ADDRESS: [place] [id number]   10/22/2019 -   Chief Complaint  Patient presents with   Follow-up    ILD Followup. SOB on exertion. Non prod cough. Pt denies any wheezing, fever, chills, or sweats.   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  Started on nintedanib 10/12/2019  Associated emphysema present on CT scan January 2021  Normal cardiac stress test 10/14/2019   HPI Lawrence Wells 67 y.o. -presents for follow-up with his wife.  At last saw him end of January 2021 for ILD evaluation.  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  He then saw a rheumatologist Dr. D yesterday 10/21/2019 and given a diagnosis of joint rheumatoid arthritis.  In the interim he also saw pulmonary nurse practitioner for a virtual visit and was started on nintedanib but she started Loraine on 10/12/2019.  So far is tolerating it fine.  He is using oxygen only with exertion.  There is no deterioration in symptoms.  Yesterday the rheumatology visit Imuran has been recommended first rheumatoid arthritis.  The TPMT test has been done and the results are pending.  He does not have liver function tests checked after he started his nintedanib.  He and his wife have many questions about the future course of the disease and natural history and other management strategies.  Currently is free was not helping him but he is willing to take it because of the associated emphysema.   I personally visualized and  reviewed the CT and also interpreted the findings myself   OV 12/02/2019  Subjective:  Patient ID: Lawrence Wells, male , DOB: 1956-06-08 , age 65 y.o. 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  Started on nintedanib 10/12/2019  Associated emphysema present on CT scan January 2021  Normal cardiac stress test 10/14/2019  Rheumatoid arthritis on Imuran  HPI Adriano Kaba Tissue 67 y.o. -6 returns for follow-up with his  wife to the [facility].  He is a Engineer, manufacturing systems.  He normally seen in the [place] clinic.  At this point in time he tells me that he is tolerating the nintedanib fine.  He needs a liver function test.  However he has lost weight 160 pounds.  He does not know the reason.  He is tolerating nintedanib fine.  He is on Imuran for his rheumatoid arthritis.  In terms of his effort tolerance it is stable.  He is undergoing pulmonary rehabilitation and is using 3-4 L of oxygen with exertion.  He is still on the waiting list for a portable system.  He is willing to be seen by the transplant team for pulmonary fibrosis..    In terms of his rheumatoid arthritis he is on Imuran he is on a prednisone taper but still having pain.  He had her steroid injections.  He is not on Plaquenil.  I have encouraged him to talk to his rheumatologist about it.  Overall stable  Overall symptom score listed below.   OV 01/06/2020  Subjective:  Patient ID: Lawrence Wells, male , DOB: 1956/07/21 , age 29 y.o. 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  Started on nintedanib 10/12/2019  Associated emphysema present on CT scan January 2021  Normal cardiac stress test 10/14/2019  Rheumatoid arthritis on Imuran   01/06/2020 -     HPI Melville E Humber 67 y.o. -there is a telephone visit.  Patient identified with 2 person identifier.  Wife also came on the phone.  He tells me in the wife also attest that since starting nintedanib he has continued to lose weight.  Today the weight is 159 pounds.  Diarrhea has started despite taking Imodium once a day.  The diarrhea is severe as documented by a level 4.  He is undergoing pulmonary rehabilitation and this is helped dyspnea but he is dealing with significant pain from the rheumatoid arthritis.  He is undergoing transplant evaluation at [facility] and is quite busy with that.  He has upcoming appointment with me in mid June 2021 for a face-to-face for pulmonary function test and follow-up.  But at this point in time symptoms of weight loss and diarrhea are paramount.     OV 03/07/2020   Subjective:  Patient ID: Lawrence Wells, male , DOB: 11-08-1955, age 14 y.o. years. 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 -presents for follow-up with his  wife.  After his last visit he had COVID-19.  He was hospitalized.  I communicated with the hospitalist.  Since then he is recovered but is now left with  a worse than baseline.  He is now requiring 4 L nasal cannula with exertion even though this is an improvement is worse than his April 2021 baseline.  In terms of his rheumatoid arthritis he is on Imuran and prednisone.  This controls the pain.  However post Covid he developed some throat swelling.  Initially thought was sinus infection.  He saw primary care this then resulted in ENT evaluation.  Mid July 2020 when he has been diagnosed with sudden onset of right tonsillar cancer.  He has [facility] [employer] appointment pending.  Because of all this he is no longer a candidate for [facility] lung transplant services.  He is doing pulmonary rehabilitation.  Also because of all this he is lost significant amount of weight as documented below.  Is lost 13 pounds since April 2021.  He is asking for handicap placard.  Symptom scores are listed below.  Overall high symptom burden.  He and his wife are very frustrated by the turn of events.     OV 08/04/2020   Subjective:  Patient ID: Lawrence Wells, male , DOB: 1955-12-17, age 62 y.o. years. , MRN: [id number],  ADDRESS: [place] [address] PCP  Olin Hauser, DO Providers : Treatment Team:  Attending Provider: Brand Males, MD Patient Care Team: Olin Hauser, DO as PCP - General (Family Medicine)    Chief Complaint  Patient presents with   Follow-up    C/o sob with exertion.      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 -returns for follow-up of his pulmonary fibrosis.  He has had a terrible year of 2021 when he is been diagnosed with pulmonary fibrosis and rheumatoid arthritis and then had Covid and then in the aftermath of Covid had right tonsillar cancer.  When I last saw him he had to go to [facility] to get chemo and radiation.  He is here with his wife.  He tells me he is finished chemo and radiation.  He says he lost 50 pounds but has gained a lot of the pounds back.  He has a PEG tube.  Because of the PEG tube and dysphagia he is not been able to take Eliquis or nintedanib.  Despite this he feels that his pulmonary fibrosis is stable.  He does not use his oxygen much.  He says he is lucky to have survived so far.  He continues Imuran for rheumatoid arthritis.  His joints feel stable.  His foot drop is improved.  Of note in the last 4 days he has developed a black coated tongue.  He is in touch with his [place] about this.  Review of his symptom score and desaturation test suggest that his ILD continues to be stable.  He and his wife are wondering when to restart his nintedanib.  He says he is slowly beginning to swallow pills and food.  I have asked him to wait for his PET scan in January 2022 and get it cleared by his cancer and ENT physicians.       OV 12/13/2020  Subjective:  Patient ID: Lawrence Wells, male , DOB: 24-Nov-1955 , age 8 y.o. , MRN: [id number] , ADDRESS: [place] [facility] [id number] PCP Olin Hauser, DO Patient Care Team: Olin Hauser, DO as PCP - General (Family Medicine)  This Provider for this visit: Treatment Team:  Attending Provider: Brand Males, MD     12/13/2020 -   Chief Complaint  Patient presents with   Follow-up    Pt states he has been doing well since last visit and denies any complaints. States he had his oxygen taken back by DME as he was  not using it.     HPI ORBA KAZIMER 67 y.o. -returns for follow-up.  Last seen in December 2021.  At this point in time he is on Imuran for his rheumatoid arthritis which also serves for his connective tissue disease.  Since May of last he has not been able to take nintedanib because he continues to lose weight.  In the interim he did have tonsillar cancer and had dysphagia and could not actually swallow nintedanib.  His tonsillar cancer is now in remission since January 2022.  I reviewed the PET scan results at [facility] and copied and pasted below.  His PEG tube is off since February 2022.  He is eating well.  He is quite functional however he is continue to lose weight which he believes is because he is more active.  It is unintentional.  But he thinks that the healthy weight loss.  A few months ago he was 140s pounds in weight.  Now he is in his 130s.  He is not overly concerned about it but will monitor.  He is able to walk quite a bit and feels less short of breath.  Today when we walked him he did not desaturate but he refused oxygen.  His last PFT was in January 2021 last CT chest was in summer 2021.  At this point in time he is content doing the Imuran.  He does not want oxygen.  He also [and I agree with him] does not want nintedanib.    PFT  No flowsheet data found.  PET Scan Jan 2022  IMPRESSION:  No definite residual mass/tumor within the region of the right tonsil  extending along the mucosal surface into the right vallecula. No suspicious  lymphadenopathy is identified.   Electronically Reviewed by:  Renae Gloss, MD, [facility]  Electronically Reviewed on:  08/30/2020 3:33 PM     08/27/2021 Follow up ; RA related ILD, pneumonia Patient returns for 1 month follow-up.  Patient was seen last visit with a slow to resolve acute bronchitis plus or minus pneumonia.  Patient had been seen by his primary care provider.  RSV, COVID and flu swabs were negative.  Chest x-ray  showed increasing bibasilar lung opacities.  Suspicious for possible superimposed infection. Patient does have rheumatoid arthritis and is on Plaquenil and Imuran. Patient was recommended to begin Levaquin and a prednisone taper.  Since last visit patient is feeling much better.  Cough and congestion have decreased substantially.  Patient says he is back to baseline feeling good.  Patient says he remains very active at home.  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  He is followed at [facility].  PEG tube was removed in February 2022. Was previously on oxygen but no longer using. Last  used 10/2020. Sent back to DME>   Flu shot is utd.   Remains active at home , works in yard and garden. Hobbies . Very active  Has completed pulmonary rehab in past.  OV 01/17/2022  Subjective:  Patient ID: Lawrence Wells, male , DOB: Lawrence 28, 1957 , age 89 y.o. , MRN: [id number] , ADDRESS: [address] [place] [facility] [address] PCP Olin Hauser, DO Patient Care Team: Olin Hauser, DO as PCP - General (Family Medicine)  This Provider for this visit: Treatment Team:  Attending Provider: Brand Males, MD    01/17/2022 -   Chief Complaint  Patient presents with   Follow-up    PFT performed today.  Pt states he has been doing okay since last visit.      HPI COURT DEISTER 67 y.o. -returns for follow-up.  He says he is doing well except for the fact that he still continues to be lean and underweight it.  He feels his cancer is under remission.  He is eating well.  He is quite active.  He is surprised that he is now off oxygen and doing well.  He is on Imuran and Plaquenil for his rheumatoid arthritis.  Is not on any antifibrotic.  He is surprised that he is alive and in fact doing well.  He  is also surprised that he is not on oxygen.  He is wondering if the chemotherapy for cancer actually help resolve his fibrosis [I could although listen to crackles and this pulm function test shows low DLCO].  He was supposed to have a high-resolution CT scan of the chest but our office sent the authorization to the wrong place.  Nevertheless he feels good.  His walking desaturation test is improved       OV 04/30/2022  Subjective:  Patient ID: Lawrence Wells, male , DOB: February 14, 1956 , age 89 y.o. , MRN: [id number] , ADDRESS: [place] [phone] PCP Olin Hauser, DO Patient Care Team: Olin Hauser, DO as PCP - General (Family Medicine)  This Provider for this visit: Treatment Team:  Attending Provider: Brand Males, MD   04/30/2022 -   Chief Complaint  Patient presents with   Follow-up    PFT  performed today.  Pt states he has been coughing up some chunks of phlegm. States otherwise he has been doing okay since last visit.     HPI JOJI KIRCHGESSNER 67 y.o. -returns for follow-up.  His symptom scores are stable.  He had pulmonary function test.  Compared to the summer 2023 today the FVC and DLCO are stable without change.  We did look at January 2021 pulmonary function test.  Paradoxically the DLCO is down while the FVC is up.  It is unclear why.  Overall he feels better.  He feels that the cancer chemotherapy really helped him.  Nevertheless on the latest high-resolution CT scan of the chest Dr. Rosario Jacks radiologist feels he has classic UIP pattern [personally visualized and agree] in addition she also feels it is progressive compared to 2021.  I went over this with him.  Did indicate to him UIP is a marker of progression in the future.  We discussed antifibrotic's.  Previously did not tolerate nintedanib.  Did explain to him that pirfenidone has similar GI side effect such as nausea weight loss anorexia and  occasionally diarrhea.  However told him  that some patients who did not tolerate nintedanib are able to tolerate pirfenidone.  Did indicate to him that given his low weight we would do the low-dose to minimize the risk of side effects but antifibrotic would be beneficial.  1 benefit with pirfenidone is that its not immunomodulating agent.  Based on all this we took a shared decision making to start pirfenidone at the lower dose of 2 pills 3 times daily.  Last blood work was in 2021 and we will repeat that today.  He has some grade 1 diastolic dysfunction and emphysema as well.  The pulmonary artery is enlarged on the CT scan but right ventricular function is normal on this echo       OV 07/30/2022  Subjective:  Patient ID: Lawrence Wells, male , DOB: [birth year] , age 60 y.o. , MRN: [id number] , ADDRESS: [address] [place] [address] PCP Olin Hauser, DO Patient Care Team: Olin Hauser, DO as PCP - General (Family Medicine)  This Provider for this visit: Treatment Team:  Attending Provider: Brand Males, MD    07/30/2022 -   Chief Complaint  Patient presents with   Follow-up    Pt states he is doing better compared to last visit 11/21. States that he still has complaints of a cough and is coughing up a lot of phlegm that is dark in color.     HPI QUAMAINE KIRCHBERG 67 y.o. -returns for follow-up.  Last visit in September 2023 because of concern of progression in his ILD started low-dose pirfenidone.  He states he is tolerating this well.  However he is reporting a lot of cough.  He has been going on several weeks.  He seen Dr. Jenetta Downer in our office was given antibiotics and prednisone and this seemed to help but then it started recurring.  Infections been going on for a month or so.  He says prednisone and albuterol have helped him.  He is complaining of dark sputum is been going on for a few weeks.  He says the cough is really bad.  Symptom score also shows worsening and fatigue.  He does have  underlying emphysema.  Last visit I did indicate to him that he would need to be on Spiriva but we ended up starting pirfenidone and we are holding off on starting Spiriva till later.  He continues to be on Imuran for his rheumatoid arthritis.  He did have a chest x-ray that I reviewed with him.  Shows chronic ILD changes.  This was when he visited Dr. Jenetta Downer acutely.   OV 10/21/2022  Subjective:  Patient ID: Lawrence Wells, male , DOB: [birth year] , age 46 y.o. , MRN: [id number] , ADDRESS: [place] [phone] PCP Olin Hauser, DO Patient Care Team: Olin Hauser, DO as PCP - General (Family Medicine)  This Provider for this visit: Treatment Team:  Attending Provider: Brand Males, MD    10/21/2022 -   Chief Complaint  Patient presents with   Follow-up    Patient has a cough. Coughing up globs of phlegm.     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   -  Started on nintedanib 10/12/2019 -> took briefly stopped May 2021 with weight loss and nerever resumed back due to dev ENT cancer/dysphagia and ongoing  weight loss even as of April 2022  - Last PFT Jan 2021 -> MARch 2024  - Last CT hest June 2021 -> Jun e 2023 and progressive  Associated emphysema present on CT scan January 2021   - start sporivia 07/30/2022 (and Rx AECOPD)  Normal cardiac stress test 10/14/2019 Normal echocardiogram September 2023 without pulmonary hypertension.  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 -returns for  routine follow-up.  This visit he had pulmonary function test it shows his FVC and FEV1 to be stable but his DLCO was declined.  He admits that he is getting a little bit more short of breath but for the last few weeks with the pollen season is also admitting that he is coughing more with green sputum and slightly increased wheezing and more shortness of breath.  Symptoms consistent with a COPD exacerbation.  He does have associated COPD.  With his decline in symptoms his walking desaturation test shows that his pulse ox went to less than 87% but he refused to except oxygen prescription.  He is compliant with his inhalers.  Is also taking his Imuran.  However he is not taking his pirfenidone as scheduled.  He is only taking it 1 tablet a day.  This because of cost issues.  He showed a letter from [facility] where his pirfenidone has been approved for 1 year from February 2024.  But he has not filled it because the co-pay is over than $450..  I reviewed the external record from a pharmacy team that called him 1 month ago and asked him to call back with the co-pay information which he has not done so far.  He is very frustrated by the high co-pay.  I explained to him the changes in  pharmacy benefits program and the infection reduction act.  He felt with these changes by the federal government more patients are going to die because of the high on affordability with the medications.  He was also upset that the letter came from [place].  He says he gets all his medicines at [facility].  I did explain to him [facility] as a Technical sales engineer and not the pharmacy.  In any event I routed these questions to our pharmacy team to evaluate ASAP.  He says that the medicines are expensive even as high as $100 a month he is not going to take these medications.  I did indicate to him that given progressive ILD I did want to discuss other medicines with him but at this point in time I would rather focus on helping  him get through his financial difficulties with the medicines.      SYMPTOM SCALE - ILD 12/02/2019 On ofev, immuran, rehab  wegt 163# 159# 01/06/2020  03/07/2020 150#, ofev, immuran, pred 7.5 and 4L [place] iwt with exertion.  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 Not on ofev  01/17/2022 131# 04/30/2022 On immuran. Not on antifibrotic Start low dose esbriet 07/30/2022 Immuran and low dose esbrit - 135# 10/21/2022 136#  O2 use ra  ra ra ra  ra ra ra   Shortness of Breath 0 -> 5 scale with 5 being worst (score 6 If unable to do)          At rest 0  0 0 0 0 0 1   Simple tasks - showers, clothes change, eating, shaving 3  5 0 0 0 1 1   Household (dishes, doing bed, laundry) 3  5 0 0 '1 1 1   '$ Shopping 4  5 0 0 '1 2 2   '$ Walking level at own pace 3.'5  5 1 '$ 0 0 1 1   Walking up Stairs '4 4 6 1 1 1 2 2   '$ Total (30-36) Dyspnea Score '18  26 2 1 3 7 8   '$ How bad is your cough? '3 2 5 '$ 0 0 0 1 2   How bad is your fatigue '3  4 2 1 '$ 0 0 5   How bad is nausea 0  4 0 0 0 0 0   How bad is vomiting?  0  0 0 0 0 0 0   How bad is diarrhea? 0 4 despite immodium 1 per day 2 0 0 0 0 0   How bad is anxiety? '2  2 5 '$ 0 0 0 0   How bad is depression 2  3 0 0 0 0 2   pain  2 0  0 0 0          Simple office walk 185 feet x  3 laps goal with forehead probe 09/10/2019  10/22/2019  .03/07/2020   08/04/2020  12/13/2020  01/17/2022  07/30/2022   10/21/2022   O2 used ra ra - walk, uses 2L portable at home with exerion ra ra  ra ra ra  Number laps completed '3 3 3 '$ attempted but did only 1/2 Did all 3  Did all 3 laps Did all 3 las Ddid all 3  Comments about pace mod     Good pace Avg pace   Resting Pulse Ox/HR 98% and 74/min 95% and 71 99% and 85/min 95% and HR 91/min 99% and HR 63 100% and 56/min 97% ad HR 71 95% and HR 66  Final Pulse Ox/HR 88% and 96/min 86% and 101 88% a and 97/mi 89% and 107% 86% and HR 81 96% and 72 bpm 91% and HR 85 87% and HR 95   Desaturated </= 88% yes yes      yes  Desaturated <= 3% points Yes, 10 points Yes, 9 pon      yes  Got Tachycardic >/= 90/min yes yes      yes  Symptoms at end of test Yes "pretty heavy" yes      dyspnea  Miscellaneous comments Corrected 2L   Corrected with 4L [place]  Refused o3 Improved Dropped 6 points Dropped 8 points   CT Chest data  No results found.    PFT     Latest Ref Rng & Units 10/21/2022    3:24 PM 04/30/2022    9:04 AM 01/17/2022    3:41 PM  PFT Results  FVC-Pre L 4.01  P 4.01  4.08   FVC-Predicted Pre % 89  P 89  86   Pre FEV1/FVC % % 73  P 81  82   FEV1-Pre L 2.92  P 3.23  3.33   FEV1-Predicted Pre % 87  P 97  95   DLCO uncorrected ml/min/mmHg 7.85  P 10.43  9.05   DLCO UNC% %  29  P 39  33   DLCO corrected ml/min/mmHg 7.85  P 10.62  9.24   DLCO COR %Predicted % 29  P 40  33   DLVA Predicted % 32  P 49  41     P Preliminary result       has a past medical history of Arthritis, Collagen vascular disease ([place]), COPD (chronic obstructive pulmonary disease) ([place]), Coronary artery disease, Dyspnea, Emphysema lung ([place]), Hyperlipidemia, and 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   reports that he quit smoking about 17 years ago. His smoking use included cigarettes. He has a 45.00 pack-year smoking history. He quit smokeless tobacco use about 17 years ago.  His smokeless tobacco use included chew.  Past Surgical History:  Procedure Laterality Date   SINUS SURGERY WITH INSTATRAK      Allergies  Allergen Reactions   Atorvastatin Other (See Comments)    Myalgias    Immunization History  Administered Date(s) Administered   Fluad Quad(high Dose 65+) 08/10/2021, 04/30/2022   Influenza, High Dose Seasonal PF 07/28/2020   PFIZER(Purple Top)SARS-COV-2 Vaccination 11/22/2019, 12/15/2019   Pneumococcal Conjugate-13 09/08/2019   Tdap 04/19/2015, 05/07/2017   Zoster Recombinat (Shingrix) 09/29/2019, 11/27/2019    Family History  Problem Relation Age of Onset   Heart disease  Mother    Heart attack Mother 68   Arthritis Mother    Heart disease Father    Heart attack Father 53   Healthy Sister    Hyperlipidemia Brother    Throat cancer Brother 65   Heart disease Brother    Healthy Son    Healthy Daughter      Current Outpatient Medications:    amoxicillin-clavulanate (AUGMENTIN) 875-125 MG tablet, Take 1 tablet by mouth 2 (two) times daily., Disp: 14 tablet, Rfl: 0   azaTHIOprine (IMURAN) 50 MG tablet, Take 2 tablets (100 mg total) by mouth daily., Disp: 180 tablet, Rfl: 0   doxycycline (VIBRA-TABS) 100 MG tablet, Take 1 tablet (100 mg total) by mouth 2 (two) times daily., Disp: 10 tablet, Rfl: 0   ezetimibe (ZETIA) 10 MG tablet, Take 1 tablet (10 mg total) by mouth daily., Disp: 90 tablet, Rfl: 3   hydroxychloroquine (PLAQUENIL) 200 MG tablet, Take 1 tablet by mouth daily., Disp: , Rfl:    meclizine (ANTIVERT) 25 MG tablet, Take 1 tablet (25 mg total) by mouth 3 (three) times daily as needed for dizziness., Disp: 30 tablet, Rfl: 2   Multiple Vitamins-Minerals (CENTRUM SILVER 50+MEN PO), Take by mouth daily., Disp: , Rfl:    Pirfenidone (ESBRIET) 267 MG TABS, Take 2 tablets (534 mg total) by mouth with breakfast, with lunch, and with evening meal. **low dose as maintenance**, Disp: 180 tablet, Rfl: 4   predniSONE (DELTASONE) 10 MG tablet, Take 4 tabs x1 day, 3 tabs x1 day, 2 tabs x1 day, 1 tab x1 day and then 1/2 tab x1 day., Disp: 11 tablet, Rfl: 0   predniSONE (DELTASONE) 20 MG tablet, Take daily with food. Start with '60mg'$  (3 pills) x 2 days, then reduce to '40mg'$  (2 pills) x 2 days, then '20mg'$  (1 pill) x 3 days, Disp: 13 tablet, Rfl: 0   rizatriptan (MAXALT-MLT) 10 MG disintegrating tablet, Take 1 tablet (10 mg total) by mouth as needed for migraine. May repeat in 2 hours if needed, Disp: 10 tablet, Rfl: 0   saw palmetto 160 MG capsule, Take 1 capsule (160 mg total) by mouth 2 (two) times daily., Disp: , Rfl:    Tiotropium Bromide Monohydrate (SPIRIVA  RESPIMAT)  2.5 MCG/ACT AERS, Inhale 2 puffs into the lungs daily., Disp: 4 g, Rfl: 5   tobramycin-dexamethasone (TOBRADEX) ophthalmic solution, Place into the left eye., Disp: , Rfl:       Objective:   Vitals:   10/21/22 1611  BP: 106/68  Pulse: 73  Temp: 97.9 F (36.6 C)  TempSrc: Oral  SpO2: 95%  Weight: 136 lb (61.7 kg)  Height: '5\' 11"'$  (1.803 m)    Estimated body mass index is 18.97 kg/m as calculated from the following:   Height as of this encounter: '5\' 11"'$  (1.803 m).   Weight as of this encounter: 136 lb (61.7 kg).  '@WEIGHTCHANGE'$ @  [employer]   10/21/22 1611  Weight: 136 lb (61.7 kg)     Physical Exam General: No distress. Thin. Coughs Neuro: Alert and Oriented x 3. GCS 15. Speech normal Psych: Pleasant Resp:  Barrel Chest - no.  Wheeze - no, Crackles - yes, No overt respiratory distress CVS: Normal heart sounds. Murmurs - no Ext: Stigmata of Connective Tissue Disease - no. CLUBBING + HEENT: Normal upper airway. PEERL +. No post nasal drip        Assessment:       ICD-10-CM   1. Interstitial lung disease due to connective tissue disease (HCC)  J84.89 Hepatic function panel   M35.9 B Nat Peptide    CT Chest High Resolution    2. Rheumatoid arthritis involving multiple sites with positive rheumatoid factor (HCC)  M05.79     3. COPD with acute exacerbation ([place])  J44.1     4. Centrilobular emphysema ([place])  J43.2     5. Medication monitoring encounter  Z51.81     6. Patient cannot afford medications  Z59.6          Plan:     Patient Instructions     ICD-10-CM   1. Interstitial lung disease due to connective tissue disease ([place])  J84.89    M35.9     2. Rheumatoid arthritis involving multiple sites with positive rheumatoid factor (HCC)  M05.79     3. COPD with acute exacerbation ([place])  J44.1     4. Centrilobular emphysema ([place])  J43.2     5. Medication monitoring encounter  Z51.81     6. Patient cannot afford medications  Z59.6       Likely  in another copd flare up Also concerned for disease progression or developing pulmonary hypertension Understand severe frustration with Co-pay of $450 for esbreit via [facility] the PBM   Plan - respect o2 deferral - check LFT, bnp 07/30/2022  - check quantiferon gold 10/21/2022 - Take doxycycline '100mg'$  po twice daily x 5 days; take after meals and avoid sunlight - Take prednisone 40 mg daily x 2 days, then '20mg'$  daily x 2 days, then '10mg'$  daily x 2 days, then '5mg'$  daily x 2 days and stop -  Please continue spiriva respimat high dose 2 puff daily - take sample, script and show technique - Contninue  pirfenidone/esbriet per protocol  - max is low dose of 2 pills three times daily  - take 5-6h apart with food, appply sunscreen and drink water a lot  - sent message to our pharmacy team to address $ issues ASAP  - do HRCT in May 2024 (1 year)  Followup - may 2024 with  DR Chase Caller; - onsite visit - 30 min   - symptom sscore and walk test  - symptoms score and walk test at followup  (  Level 05 visit: Estb 40-54 min n  visit type: on-site physical face to visit  in total care time and counseling or/and coordination of care by this undersigned MD - Dr Brand Males. This includes one or more of the following on this same day 10/21/2022: pre-charting, chart review, note writing, documentation discussion of test results, diagnostic or treatment recommendations, prognosis, risks and benefits of management options, instructions, education, compliance or risk-factor reduction. It excludes time spent by the [place] or office staff in the care of the patient. Actual time 40 min)   SIGNATURE    Dr. Brand Males, M.D., F.C.C.P,  Pulmonary and Critical Care Medicine Staff Physician, [place] Director - Interstitial Lung Disease  Program  Pulmonary [place] at [place], [place], [address]  Pager: [phone], If no answer or between   15:00h - 7:00h: call [phone] Telephone: [phone]  5:00 PM 10/21/2022

## 2022-10-21 NOTE — Telephone Encounter (Signed)
Lawrence Wells  Lawrence Wells - is uspet   A) copay is $460/month  B) And he is upset that he is dealing with [facility] because he normally deals with [facility]. ADvised [facility] is a PBM  I treid to advise of new CMS rules on co pay . But will be best if you called him  He has been taking esbriet only 1/day but got a week or two left

## 2022-10-21 NOTE — Progress Notes (Signed)
Spiro and DLCO performed today. 

## 2022-10-28 NOTE — Telephone Encounter (Signed)
Pt's wife reached out to clinic and informed me that they received a letter describing what sounds like a request for a new PA. Per wife pt has obtained new insurance through [facility]. Advised that I would begin PA process and be in touch.  However after reading through this encounter I called back and went back over everything that [facility] had discussed with the pt at that time. Explained to wife the detailed step-by-step plan and how to go about filling the medication. Advised that the medication should be ran through the [facility] BCBS plan and NOT the [facility] plan, and if the copay was too high then they should be able to assist the pt with enrollment into a copay card program. Requested that if they get any push back regarding this to please contact us, my direct office phone has been provided.  Will await f/u

## 2022-10-28 NOTE — Telephone Encounter (Signed)
[place] spoke w patient's wife today to discuss next steps of trying to use copay card with [place] plan coverage  Knox Saliva, PharmD, MPH, BCPS, CPP Clinical Pharmacist (Rheumatology and Pulmonology)

## 2022-10-28 NOTE — Telephone Encounter (Signed)
Encounter opened in error, please disregard

## 2022-10-30 ENCOUNTER — Other Ambulatory Visit (HOSPITAL_COMMUNITY): Payer: Self-pay

## 2022-10-30 NOTE — Telephone Encounter (Signed)
Received fax from [facility] requesting additional information. Completed form and faxed back. Will await response.

## 2022-10-30 NOTE — Telephone Encounter (Signed)
Pt's wife called back and informed me that the best [facility] could offer was a one-time coupon to bring the pt's copay down to $15. She inquired about possibly receiving manufacturer assistance for the medication and I informed her that I would attempt to get a PA approved for the name-brand through the pt's Medicare plan. I informed her that I will need to know the annual income for their household, and asked her to give me a call back once she has that amount readily available. She verbalized understanding to all.  Submitted a Prior Authorization request to [facility] for ESBRIET via CoverMyMeds. Will update once we receive a response.  Key: [id number]

## 2022-11-01 NOTE — Telephone Encounter (Signed)
Received a fax regarding Prior Authorization from [facility] for [place]. Authorization has been DENIED because the medication is not FDA approved for treatment of ILD. Per [place] guidelines, this should be an acceptable denial reason to be able to qualify for PAP.   Contacted pt's wife to discuss--she states that she is in [place] right now and does not have access to information regarding household income at this time, however she will call me back on Wednesday when she is back home.  Provider portion will be placed in MR box for signature, copies of the application and supporting documentation have been placed in "Awaiting Response" folder.

## 2022-11-06 NOTE — Telephone Encounter (Signed)
Provider portion has been returned to pharmacy team, we are still awaiting income information from pt.

## 2022-11-11 NOTE — Telephone Encounter (Signed)
Submitted Patient Assistance Application to [place] for Lawrence Wells along with provider portion and PA denial letter. Will update patient when we receive a response.  Phone #: [phone] Fax #: [phone]

## 2022-11-25 NOTE — Telephone Encounter (Signed)
Received fax from [place] for ESBRIET patient assistance, patient's application has been DENIED due to exceeding financial requirements .    Phone #: [phone] Fax #: [phone]  ATC patient's wife - phone kept ringing and did not lead to VM. Will have to ATC again later  [name], PharmD, MPH, BCPS, CPP Clinical Pharmacist (Rheumatology and Pulmonology)

## 2022-11-27 ENCOUNTER — Other Ambulatory Visit (HOSPITAL_COMMUNITY): Payer: Self-pay

## 2022-11-27 NOTE — Telephone Encounter (Signed)
Received notification from [facility] regarding a prior authorization for PIRFENIDONE. Authorization has been APPROVED from 11/27/2022 to 08/19/2023.    Per test claim, copay for 30 days supply (#180 tablets) is $453.89  Authorization # [id number]   Investigation into filling through [facility] revealed that the pt's estimated charge for #180 tablets would be around $160.  Reached out and spoke to pt's wife to provide update and she informs me that she actually just got off the phone with an [facility] representative who informed her that, not only was the medication approved, but that the copay was going to be ~$130 dollars. Furthermore, the pt inquired if the price could be lowered any further and the rep responded very confidently "yes" and informed them that she would be reaching out to clinic in reference to a tier reduction.   Advised Mrs. Clyne that, in my own experience, receiving a tier reduction approval for even run-of-the-mill medications is not very common--never mind receiving one for a medication as expensive as pirfenidone. Regardless, I told her that I would keep my eyes and ears out for any communication from [employer] and we would give it our best effort despite my own skepticism. She stated that the rep was estimating a turn-around time of roughly 72 hours.  Will await correspondence from [facility].

## 2022-11-27 NOTE — Telephone Encounter (Signed)
In the interest of determining all remaining options available to the pt, we submitted a Prior Authorization request to [facility] for generic PIRFENIDONE via CoverMyMeds. Will update once we receive a response.  Key: [id number]

## 2022-12-17 NOTE — Telephone Encounter (Signed)
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 They will reach back out once plan is active so we can re-run pirfenidone authorization through new plan. Will await f/u from pt/his wife  [name], PharmD, MPH, BCPS, CPP Clinical Pharmacist (Rheumatology and Pulmonology)

## 2022-12-19 ENCOUNTER — Telehealth: Payer: Self-pay | Admitting: Internal Medicine

## 2022-12-19 ENCOUNTER — Ambulatory Visit: Payer: Medicare HMO

## 2022-12-19 NOTE — Telephone Encounter (Signed)
Patient called to inform the doctor that his CT appt. Was cancelled today because they said he did not have the doctor's approval.  Please advise and call patient to discuss further.  CB# [phone]

## 2022-12-20 ENCOUNTER — Other Ambulatory Visit (HOSPITAL_COMMUNITY): Payer: Self-pay

## 2022-12-20 ENCOUNTER — Telehealth: Payer: Self-pay

## 2022-12-20 MED ORDER — PIRFENIDONE 267 MG PO CAPS: 534.0000 mg | ORAL_CAPSULE | Freq: Three times a day (TID) | ORAL | 5 refills | Status: DC

## 2022-12-20 NOTE — Telephone Encounter (Signed)
Attempted to submit a Prior Authorization request to [facility] for PIRFENIDONE via CoverMyMeds. Request was cancelled due to pre-existing authorization dated 08/19/2021 to 08/19/2023.  Key: Lorenza Evangelist

## 2022-12-20 NOTE — Telephone Encounter (Signed)
Followed back up with pt and informed them that I attempted the PA process but was unable to do anything further on my end to alter the price. They state that they had spoken to a rep from [facility] who informed them that she was able to see that an Rx for pirfenidone capsules would supposedly cost them only $6.   Attempted a test claim for the capsules but ended up getting the same $147 copay regardless. Advised that they may just have to simply fill through [facility] in order to get the price they are being quoted and that I would reach out to [facility] and have her send in a new Rx for the capsule formulation to [facility]. Instructed them to wait a couple of hours before reaching back out and to verify that the price IS in fact what they are being quoted before agreeing to shipment. Requested that they call me back directly if the price remains higher than expected. Pt and wife verbalized understanding to all.

## 2022-12-20 NOTE — Telephone Encounter (Addendum)
Pt contacted me and provided new insurance info. Per included paperwork, cost for pirfenidone should be ~$18 when picked up from a retail pharmacy OR ~$50 if filling through a mail order pharmacy (it does specifically state AFTER a prior authorization, however). Test claim shows that medication is covered, however the copay was $147.41. Informed patient that, due to the wording of the letter they received, I would go ahead and attempt to submit a prior authorization to see if it would affect the price at all.  Will run new PA in separate encounter.  New Insurance Info:  RxBIN: [id number] RxPCN: [id number] RxGRP: [id number] ID: [id number]

## 2022-12-20 NOTE — Telephone Encounter (Signed)
Rx for pirfenidone 267 mg capsules (2 caps three times daily) sent to [facility]. Will await f/u from patient and/or his wife for any forthcoming cost issues  [name], PharmD, MPH, BCPS, CPP Clinical Pharmacist (Rheumatology and Pulmonology)

## 2022-12-24 DIAGNOSIS — H903 Sensorineural hearing loss, bilateral: Secondary | ICD-10-CM | POA: Diagnosis not present

## 2022-12-24 DIAGNOSIS — Z85818 Personal history of malignant neoplasm of other sites of lip, oral cavity, and pharynx: Secondary | ICD-10-CM | POA: Diagnosis not present

## 2022-12-24 DIAGNOSIS — B379 Candidiasis, unspecified: Secondary | ICD-10-CM | POA: Diagnosis not present

## 2022-12-24 DIAGNOSIS — R1314 Dysphagia, pharyngoesophageal phase: Secondary | ICD-10-CM | POA: Diagnosis not present

## 2022-12-24 NOTE — Telephone Encounter (Signed)
Pt scheduled for an upcoming OV with Dr. Marchelle Gearing 5/17.  Pt's CT was cancelled and I saw the below message in regards to why the CT was cancelled:  No [facility] manager (Insurance coverage changed No auth on file Left message with office)     Dr. Marchelle Gearing, please advise if you are still okay with the OV taking place since pt has not had the CT.

## 2022-12-25 NOTE — Telephone Encounter (Signed)
If he can have a CT scan before he sees me that would be great.  But if he cannot because he cannot afford it I can just manage although it is less ideal

## 2022-12-26 NOTE — Telephone Encounter (Signed)
Atc no vm to get patient resc for there ct

## 2022-12-26 NOTE — Telephone Encounter (Signed)
Pt. Calling back

## 2022-12-26 NOTE — Telephone Encounter (Signed)
Pt aware of appt.

## 2022-12-26 NOTE — Telephone Encounter (Signed)
Routing to [facility] for them to review this and to see if there is anything that can be done in regards to the authorization for the CT.

## 2022-12-27 DIAGNOSIS — M069 Rheumatoid arthritis, unspecified: Secondary | ICD-10-CM | POA: Diagnosis not present

## 2022-12-27 DIAGNOSIS — M0579 Rheumatoid arthritis with rheumatoid factor of multiple sites without organ or systems involvement: Secondary | ICD-10-CM | POA: Diagnosis not present

## 2022-12-27 DIAGNOSIS — J849 Interstitial pulmonary disease, unspecified: Secondary | ICD-10-CM | POA: Diagnosis not present

## 2022-12-27 DIAGNOSIS — Z79899 Other long term (current) drug therapy: Secondary | ICD-10-CM | POA: Diagnosis not present

## 2023-01-01 ENCOUNTER — Ambulatory Visit
Admission: RE | Admit: 2023-01-01 | Discharge: 2023-01-01 | Disposition: A | Discharge status: Home / Self Care | Payer: Medicare HMO | Source: Ambulatory Visit | Attending: Internal Medicine | Admitting: Internal Medicine

## 2023-01-01 DIAGNOSIS — J479 Bronchiectasis, uncomplicated: Secondary | ICD-10-CM | POA: Diagnosis not present

## 2023-01-01 DIAGNOSIS — J8489 Other specified interstitial pulmonary diseases: Secondary | ICD-10-CM | POA: Insufficient documentation

## 2023-01-01 DIAGNOSIS — J432 Centrilobular emphysema: Secondary | ICD-10-CM | POA: Diagnosis not present

## 2023-01-01 DIAGNOSIS — M359 Systemic involvement of connective tissue, unspecified: Secondary | ICD-10-CM | POA: Insufficient documentation

## 2023-01-02 NOTE — Telephone Encounter (Signed)
Spoke with [facility]. Per rep, copay is $147.41 for 30 days. They last talked to patient on 12/21/2022. They were offered screening for copay assistance but for some reason the patient declined.  I spoke with patient and he states he received letter stating pirfenidone capsules should be $6. I advised that the pharmacy was actively billing his insurance today for pirfenidone capsules and copay was not $6.  I will try to call patient's wife tomorrow and advise them to be opt into financial assistance screening at [facility]  [name], PharmD, MPH, BCPS, CPP Clinical Pharmacist (Rheumatology and Pulmonology)

## 2023-01-03 ENCOUNTER — Encounter: Payer: Self-pay | Admitting: Internal Medicine

## 2023-01-03 ENCOUNTER — Ambulatory Visit (INDEPENDENT_AMBULATORY_CARE_PROVIDER_SITE_OTHER): Payer: Medicare HMO | Admitting: Internal Medicine

## 2023-01-03 ENCOUNTER — Telehealth: Payer: Self-pay | Admitting: Internal Medicine

## 2023-01-03 VITALS — BP 124/76 | HR 69 | Temp 98.8°F | Ht 71.0 in | Wt 137.0 lb

## 2023-01-03 DIAGNOSIS — M359 Systemic involvement of connective tissue, unspecified: Secondary | ICD-10-CM

## 2023-01-03 DIAGNOSIS — Z5181 Encounter for therapeutic drug level monitoring: Secondary | ICD-10-CM | POA: Diagnosis not present

## 2023-01-03 DIAGNOSIS — J432 Centrilobular emphysema: Secondary | ICD-10-CM

## 2023-01-03 DIAGNOSIS — J8489 Other specified interstitial pulmonary diseases: Secondary | ICD-10-CM | POA: Diagnosis not present

## 2023-01-03 DIAGNOSIS — M0579 Rheumatoid arthritis with rheumatoid factor of multiple sites without organ or systems involvement: Secondary | ICD-10-CM

## 2023-01-03 DIAGNOSIS — Z5986 Financial insecurity: Secondary | ICD-10-CM | POA: Diagnosis not present

## 2023-01-03 LAB — HEPATIC FUNCTION PANEL
ALT: 10 U/L (ref 0–53)
AST: 19 U/L (ref 0–37)
Albumin: 4 g/dL (ref 3.5–5.2)
Alkaline Phosphatase: 74 U/L (ref 39–117)
Bilirubin, Direct: 0.1 mg/dL (ref 0.0–0.3)
Total Bilirubin: 0.4 mg/dL (ref 0.2–1.2)
Total Protein: 6.9 g/dL (ref 6.0–8.3)

## 2023-01-03 MED ORDER — SPIRIVA RESPIMAT 2.5 MCG/ACT IN AERS: 2.0000 | INHALATION_SPRAY | Freq: Every day | RESPIRATORY_TRACT | 0 refills | Status: DC

## 2023-01-03 NOTE — Addendum Note (Signed)
Addended by: Bernerd Pho I on: 01/03/2023 03:21 PM   Modules accepted: Orders

## 2023-01-03 NOTE — Telephone Encounter (Signed)
Rx team saw Lawrence Wells and wife today . They are saying mail order esbriet capsure is $0 to $6.7 and tablet is $18.58-59.56. They are out of esbrioet. Please help them. They said [name] was helping

## 2023-01-03 NOTE — Patient Instructions (Addendum)
ICD-10-CM   1. Interstitial lung disease due to connective tissue disease (HCC)  J84.89 Hepatic function panel   M35.9 Hepatic function panel    B Nat Peptide    2. Rheumatoid arthritis involving multiple sites with positive rheumatoid factor (HCC)  M05.79     3. Medication monitoring encounter  Z51.81     4. Centrilobular emphysema (HCC)  J43.2     5. Patient cannot afford medications  Z59.86       Emphyesma is stable Pulmonary fibrosis stable x 1 year on CT and symptoms Weight stable    Plan - respect o2 deferral - check LFT, 01/03/2023 -  Please continue spiriva respimat high dose 2 puff daily - take sample, script and show technique - RESTART  pirfenidone/esbriet LOW DOse protocol  - max is low dose of 2 pills three times daily  - take 5-6h apart with food, appply sunscreen and drink water a lot  - sent message to our pharmacy team to address $ issues ASAP - get RSV vaccilne  Followup - 4 months with  DR Marchelle Gearing; - onsite visit - 30 min   - symptom sscore and walk test  - symptoms score and walk test at followup

## 2023-01-03 NOTE — Progress Notes (Signed)
OV 09/10/2019  Subjective:  Patient ID: Lawrence Wells, male , DOB: 1955/12/06 , age 67 y.o. , MRN: [id number] , ADDRESS: [address] [place] [address]   1/91/4782 -   Chief Complaint  Patient presents with   pulmonary consult    per Dr. Althea Charon- CXR 09/08/2019. pt reports of sob with exertion, talking and bending, prod cough with yellow mucus, left sided chest discomfort  and wheezing.     HPI Lawrence Wells 67 y.o. -referred for interstitial lung disease after a chest x-ray from September 08, 2019 that I personally visualized and interpreted shows diffuse ILD along with left upper lobe nodule.  Patient has wife tell me that he has had insidious onset of shortness of breath for a year with progression in the last few months.  They also tell me that few years ago he had an chest x-ray done for an incidental unrelated reason that showed chronic changes of scarring but apparently were reassured.  In review of his chart and noticed that in 2005 this is chest x-ray done in the [facility] system with reports of ILD in it.  He is unaware of that.  History for this visit is given by the patient and his wife and review of the chart. [facility] Comprehensive ILD Questionnaire  Symptoms:   Insidious onset of shortness of breath for the last year getting worse in the last 3 to 4 months.  Definitely no dyspnea a few years ago even though chest x-ray from 2005 was reported as ILD.  Sometimes dyspnea is episodic but mostly with exertion relieved by rest.  Symptom severity is listed below.  There is associated significant cough.  Cough started in October 2020.  It is getting worse cough is rated as moderate to severe.  He coughs at night.  He does bring up some yellow phlegm.  It is worse when he lies down.Marland Kitchen  He does clear his throat and he does feel a tickle in the back of the throat.  There is also associated wheezing.       Past Medical History : He gives a history  positive for arthritis.  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  Denies any asthma or known COPD.  Denies heart failure.  Denies scleroderma any collagen vascular disease.  Denies diagnosis of acid reflux or hiatal hernia.  Denies obstructive sleep apnea.  Denies pulmonary hypertension.  Denies diabetes or thyroid disease or stroke or seizures.  Denies infectious mononucleosis.  Denies hepatitis.  Denies tuberculosis denies kidney disease.  Denies blood clots denies heart disease denies pleurisy.   ROS: Positive for fatigue and arthralgia.  Positive for acid reflux.  He went to check the box for [name].  Denies any GI symptoms or rash or ulcers  FAMILY HISTORY of LUNG DISEASE: Denies including COPD and pulmonary fibrosis   EXPOSURE HISTORY: Smokes cigarettes between 1983 and 2007 1 to 2 packs/day and then quit.  Did not smoke cigars did not smoke pipe.  Did not vape.  Never smoked marijuana or cocaine.  Never used intravenous drugs.   HOME and HOBBY DETAILS : [place].  The age of the home is 20 years.  There is no dampness in the living environment.  No mold or mildew.  Does  not use humidifier.  No CPAP use.  Does not use nebulizer.  No steam iron use.  No Jacuzzi.  No pet birds or parakeets in the house.  No pet gerbils.  No feather pillows.  No mold in the [facility] duct.  Does not play wind instruments.  Does not do any gardening.   OCCUPATIONAL HISTORY (122 questions) : Works in the Therapist, art for 30 years.  Worked in [employer] and crawl spaces.  A lot of dust exposure.  During this time he worked in damp air-conditioned spaces.  He thinks he had asbestos exposure.  He also worked in the [employer].  He cleaned AC coils.  Otherwise history is negative   PULMONARY TOXICITY HISTORY (27 items):  Entirely negative.       ROS - per HPI    OV 10/22/2019  Subjective:  Patient ID: Lawrence Wells, male , DOB: 12-03-1955 , age 30 y.o. , MRN: [id number] , ADDRESS: [address] [place] [address]   08/27/1476 -   Chief Complaint  Patient presents with   Follow-up    ILD Followup. SOB on exertion. Non prod cough. Pt denies any wheezing, fever, chills, or sweats.   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  Started on nintedanib 10/12/2019  Associated emphysema present on CT scan January 2021  Normal cardiac stress test 10/14/2019   HPI Lawrence Wells 66 y.o. -presents for follow-up with his wife.  At last saw him end of January 2021 for ILD evaluation.  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  He then saw a rheumatologist Dr. D yesterday 10/21/2019 and given a diagnosis of joint rheumatoid arthritis.  In the interim he also saw pulmonary nurse practitioner for a virtual visit and was started on nintedanib but she started [place] on 10/12/2019.  So far is tolerating it fine.  He is using oxygen only with exertion.  There is no deterioration in symptoms.  Yesterday the rheumatology visit Imuran has been recommended first rheumatoid arthritis.  The TPMT test has been done and the results are pending.  He does not have liver function tests checked after he started his nintedanib.  He and his wife have many questions about the future course of the disease and natural history and other management strategies.  Currently is free was not helping him but he is willing to take it because of the associated emphysema.   I personally visualized and  reviewed the CT and also interpreted the findings myself   OV 12/02/2019  Subjective:  Patient ID: Lawrence Wells, male , DOB: 1956/06/19 , age 41 y.o. 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  Started on nintedanib 10/12/2019  Associated emphysema present on CT scan January 2021  Normal cardiac stress test 10/14/2019  Rheumatoid arthritis on Imuran  HPI Lawrence Wells 67 y.o. -4 returns for follow-up with his  wife to the [facility].  He is a Environmental education officer.  He normally seen in the [facility].  At this point in time he tells me that he is tolerating the nintedanib fine.  He needs a liver function test.  However he has lost weight 160 pounds.  He does not know the reason.  He is tolerating nintedanib fine.  He is on Imuran for his rheumatoid arthritis.  In terms of his effort tolerance it is stable.  He is undergoing pulmonary rehabilitation and is using 3-4 L of oxygen with exertion.  He is still on the waiting list for a portable system.  He is willing to be seen by the transplant team for pulmonary fibrosis..    In terms of his rheumatoid arthritis he is on Imuran he is on a prednisone taper but still having pain.  He had her steroid injections.  He is not on Plaquenil.  I have encouraged him to talk to his rheumatologist about it.  Overall stable  Overall symptom score listed below.   OV 01/06/2020  Subjective:  Patient ID: Lawrence Wells, male , DOB: 1955/08/22 , age 72 y.o. , MRN: [id number] , ADDRESS: [address] [place] [address]   Follow-up rheumatoid arthritis-ILD/UIP pattern (based on high titer rheumatoid factor 09/10/2019, UIP on CT scan 09/14/2019  and rheumatology visit confirming diagnosis of rheumatoid arthritis on 10/21/2019].  Started on nintedanib 10/12/2019  Associated emphysema present on CT scan January 2021  Normal cardiac stress test 10/14/2019  Rheumatoid arthritis on Imuran   01/06/2020 -     HPI Lawrence Wells 67 y.o. -there is a telephone visit.  Patient identified with 2 person identifier.  Wife also came on the phone.  He tells me in the wife also attest that since starting nintedanib he has continued to lose weight.  Today the weight is 159 pounds.  Diarrhea has started despite taking Imodium once a day.  The diarrhea is severe as documented by a level 4.  He is undergoing pulmonary rehabilitation and this is helped dyspnea but he is dealing with significant pain from the rheumatoid arthritis.  He is undergoing transplant evaluation at [facility] and is quite busy with that.  He has upcoming appointment with me in mid June 2021 for a face-to-face for pulmonary function test and follow-up.  But at this point in time symptoms of weight loss and diarrhea are paramount.     OV 03/07/2020   Subjective:  Patient ID: Lawrence Wells, male , DOB: 02-16-1956, age 11 y.o. years. , MRN: [id number],  ADDRESS: 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 -presents for follow-up with his  wife.  After his last visit he had COVID-19.  He was hospitalized.  I communicated with the hospitalist.  Since then he is recovered but is now left with  a worse than baseline.  He is now requiring 4 L nasal cannula with exertion even though this is an improvement is worse than his April 2021 baseline.  In terms of his rheumatoid arthritis he is on Imuran and prednisone.  This controls the pain.  However post Covid he developed some throat swelling.  Initially thought was sinus infection.  He saw primary care this then resulted in ENT evaluation.  Mid July 2020 when he has been diagnosed with sudden onset of right tonsillar cancer.  He has [facility] [employer] appointment pending.  Because of all this he is no longer a candidate for [facility] lung transplant services.  He is doing pulmonary rehabilitation.  Also because of all this he is lost significant amount of weight as documented below.  Is lost 13 pounds since April 2021.  He is asking for handicap placard.  Symptom scores are listed below.  Overall high symptom burden.  He and his wife are very frustrated by the turn of events.     OV 08/04/2020   Subjective:  Patient ID: Lawrence Wells, male , DOB: Jan 02, 1956, age 67 y.o. years. , MRN: [id number],  ADDRESS: [address] [place] [facility] [address] PCP  Smitty Cords, DO Providers : Treatment Team:  Attending Provider: Kalman Shan, MD Patient Care Team: Smitty Cords, DO as PCP - General (Family Medicine)    Chief Complaint  Patient presents with   Follow-up    C/o sob with exertion.      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 -returns for follow-up of his pulmonary fibrosis.  He has had a terrible year of 2021 when he is been diagnosed with pulmonary fibrosis and rheumatoid arthritis and then had Covid and then in the aftermath of Covid had right tonsillar cancer.  When I last saw him he had to go to [facility] to get chemo and radiation.  He is here with his wife.  He tells me he is finished chemo and radiation.  He says he lost 50 pounds but has gained a lot of the pounds back.  He has a PEG tube.  Because of the PEG tube and dysphagia he is not been able to take Eliquis or nintedanib.  Despite this he feels that his pulmonary fibrosis is stable.  He does not use his oxygen much.  He says he is lucky to have survived so far.  He continues Imuran for rheumatoid arthritis.  His joints feel stable.  His foot drop is improved.  Of note in the last 4 days he has developed a black coated tongue.  He is in touch with his [facility] physicians about this.  Review of his symptom score and desaturation test suggest that his ILD continues to be stable.  He and his wife are wondering when to restart his nintedanib.  He says he is slowly beginning to swallow pills and food.  I have asked him to wait for his PET scan in January 2022 and get it cleared by his cancer and ENT physicians.       OV 12/13/2020  Subjective:  Patient ID: Lawrence Wells, male , DOB: 08-Sep-1955 , age 59 y.o. , MRN: [id number] , ADDRESS: [address] [place] [facility] [address] PCP Smitty Cords, DO Patient Care Team: Smitty Cords, DO as PCP - General (Family Medicine)  This Provider for this visit: Treatment Team:  Attending Provider: Kalman Shan, MD     12/13/2020 -   Chief Complaint  Patient presents with   Follow-up    Pt states he has been doing well since last visit and denies any complaints. States he had his oxygen taken back by DME as he was  not using it.     HPI Lawrence Wells 67 y.o. -returns for follow-up.  Last seen in December 2021.  At this point in time he is on Imuran for his rheumatoid arthritis which also serves for his connective tissue disease.  Since May of last he has not been able to take nintedanib because he continues to lose weight.  In the interim he did have tonsillar cancer and had dysphagia and could not actually swallow nintedanib.  His tonsillar cancer is now in remission since January 2022.  I reviewed the PET scan results at [facility] and copied and pasted below.  His PEG tube is off since February 2022.  He is eating well.  He is quite functional however he is continue to lose weight which he believes is because he is more active.  It is unintentional.  But he thinks that the healthy weight loss.  A few months ago he was 140s pounds in weight.  Now he is in his 130s.  He is not overly concerned about it but will monitor.  He is able to walk quite a bit and feels less short of breath.  Today when we walked him he did not desaturate but he refused oxygen.  His last PFT was in January 2021 last CT chest was in summer 2021.  At this point in time he is content doing the Imuran.  He does not want oxygen.  He also [and I agree with him] does not want nintedanib.    PFT  No flowsheet data found.  PET Scan Jan 2022  IMPRESSION:  No definite residual mass/tumor within the region of the right tonsil  extending along the mucosal surface into the right vallecula. No suspicious  lymphadenopathy is identified.   Electronically Reviewed by:  Deno Etienne, MD, [facility]  Electronically Reviewed on:  08/30/2020 3:33 PM     08/27/2021 Follow up ; RA related ILD, pneumonia Patient returns for 1 month follow-up.  Patient was seen last visit with a slow to resolve acute bronchitis plus or minus pneumonia.  Patient had been seen by his primary care provider.  RSV, COVID and flu swabs were negative.  Chest x-ray  showed increasing bibasilar lung opacities.  Suspicious for possible superimposed infection. Patient does have rheumatoid arthritis and is on Plaquenil and Imuran. Patient was recommended to begin Levaquin and a prednisone taper.  Since last visit patient is feeling much better.  Cough and congestion have decreased substantially.  Patient says he is back to baseline feeling good.  Patient says he remains very active at home.  His wife is retiring at the end of the month and they plan on doing some camping as they have a Electronics engineer.  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  He is [facility] at [facility].  PEG tube was removed in February 2022. Was previously on oxygen but no longer using. Last  used 10/2020. Sent back to DME>   Flu shot is utd.   Remains active at home , works in yard and garden. Hobbies . Very active  Has completed pulmonary rehab in past.  OV 01/17/2022  Subjective:  Patient ID: Lawrence Wells, male , DOB: 10-18-1955 , age 52 y.o. , MRN: [id number] , ADDRESS: [address] [place] [facility] [address] PCP Smitty Cords, DO Patient Care Team: Smitty Cords, DO as PCP - General (Family Medicine)  This Provider for this visit: Treatment Team:  Attending Provider: Kalman Shan, MD    01/17/2022 -   Chief Complaint  Patient presents with   Follow-up    PFT performed today.  Pt states he has been doing okay since last visit.      HPI Lawrence Wells 67 y.o. -returns for follow-up.  He says he is doing well except for the fact that he still continues to be lean and underweight it.  He feels his cancer is under remission.  He is eating well.  He is quite active.  He is surprised that he is now off oxygen and doing well.  He is on Imuran and Plaquenil for his rheumatoid arthritis.  Is not on any antifibrotic.  He is surprised that he is alive and in fact doing well.  He  is also surprised that he is not on oxygen.  He is wondering if the chemotherapy for cancer actually help resolve his fibrosis [I could although listen to crackles and this pulm function test shows low DLCO].  He was supposed to have a high-resolution CT scan of the chest but our office sent the authorization to the wrong place.  Nevertheless he feels good.  His walking desaturation test is improved       OV 04/30/2022  Subjective:  Patient ID: Lawrence Wells, male , DOB: 29-Jun-1956 , age 33 y.o. , MRN: [id number] , ADDRESS: [address] [place] [facility] [address] PCP Smitty Cords, DO Patient Care Team: Smitty Cords, DO as PCP - General (Family Medicine)  This Provider for this visit: Treatment Team:  Attending Provider: Kalman Shan, MD   04/30/2022 -   Chief Complaint  Patient presents with   Follow-up    PFT  performed today.  Pt states he has been coughing up some chunks of phlegm. States otherwise he has been doing okay since last visit.     HPI Lawrence Wells 67 y.o. -returns for follow-up.  His symptom scores are stable.  He had pulmonary function test.  Compared to the summer 2023 today the FVC and DLCO are stable without change.  We did look at January 2021 pulmonary function test.  Paradoxically the DLCO is down while the FVC is up.  It is unclear why.  Overall he feels better.  He feels that the cancer chemotherapy really helped him.  Nevertheless on the latest high-resolution CT scan of the chest Dr. Fredirick Lathe radiologist feels he has classic UIP pattern [personally visualized and agree] in addition she also feels it is progressive compared to 2021.  I went over this with him.  Did indicate to him UIP is a marker of progression in the future.  We discussed antifibrotic's.  Previously did not tolerate nintedanib.  Did explain to him that pirfenidone has similar GI side effect such as nausea weight loss anorexia and  occasionally diarrhea.  However told him  that some patients who did not tolerate nintedanib are able to tolerate pirfenidone.  Did indicate to him that given his low weight we would do the low-dose to minimize the risk of side effects but antifibrotic would be beneficial.  1 benefit with pirfenidone is that its not immunomodulating agent.  Based on all this we took a shared decision making to start pirfenidone at the lower dose of 2 pills 3 times daily.  Last blood work was in 2021 and we will repeat that today.  He has some grade 1 diastolic dysfunction and emphysema as well.  The pulmonary artery is enlarged on the CT scan but right ventricular function is normal on this echo       OV 07/30/2022  Subjective:  Patient ID: Lawrence Wells, male , DOB: Sep 30, 1955 , age 4 y.o. , MRN: [id number] , ADDRESS: [address] [place] [facility] [address] PCP Smitty Cords, DO Patient Care Team: Smitty Cords, DO as PCP - General (Family Medicine)  This Provider for this visit: Treatment Team:  Attending Provider: Kalman Shan, MD    07/30/2022 -   Chief Complaint  Patient presents with   Follow-up    Pt states he is doing better compared to last visit 11/21. States that he still has complaints of a cough and is coughing up a lot of phlegm that is dark in color.     HPI Lawrence Wells 67 y.o. -returns for follow-up.  Last visit in September 2023 because of concern of progression in his ILD started low-dose pirfenidone.  He states he is tolerating this well.  However he is reporting a lot of cough.  He has been going on several weeks.  He seen Dr. Val Eagle in our office was given antibiotics and prednisone and this seemed to help but then it started recurring.  Infections been going on for a month or so.  He says prednisone and albuterol have helped him.  He is complaining of dark sputum is been going on for a few weeks.  He says the cough is really bad.  Symptom score also shows worsening and fatigue.  He does have  underlying emphysema.  Last visit I did indicate to him that he would need to be on Spiriva but we ended up starting pirfenidone and we are holding off on starting Spiriva till later.  He continues to be on Imuran for his rheumatoid arthritis.  He did have a chest x-ray that I reviewed with him.  Shows chronic ILD changes.  This was when he visited Dr. Val Eagle acutely.   OV 10/21/2022  Subjective:  Patient ID: Lawrence Wells, male , DOB: 1956/02/24 , age 49 y.o. , MRN: [id number] , ADDRESS: [address] [place] [facility] [address] PCP Smitty Cords, DO Patient Care Team: Smitty Cords, DO as PCP - General (Family Medicine)  This Provider for this visit: Treatment Team:  Attending Provider: Kalman Shan, MD    10/21/2022 -   Chief Complaint  Patient presents with   Follow-up    Patient has a cough. Coughing up globs of phlegm.      HPI Lawrence Wells 67 y.o. -returns for routine follow-up.  This visit he had pulmonary function test it shows his FVC and FEV1 to be stable but his DLCO was declined.  He admits that he is getting a little bit more short of breath but for the last few weeks with  the pollen season is also admitting that he is coughing more with green sputum and slightly increased wheezing and more shortness of breath.  Symptoms consistent with a COPD exacerbation.  He does have associated COPD.  With his decline in symptoms his walking desaturation test shows that his pulse ox went to less than 87% but he refused to except oxygen prescription.  He is compliant with his inhalers.  Is also taking his Imuran.  However he is not taking his pirfenidone as scheduled.  He is only taking it 1 tablet a day.  This because of cost issues.  He showed a letter from [facility] where his pirfenidone has been approved for 1 year from February 2024.  But he has not filled it because the co-pay is over than $450..  I reviewed the external record from a pharmacy team that called  him 1 month ago and asked him to call back with the co-pay information which he has not done so far.  He is very frustrated by the high co-pay.  I explained to him the changes in  pharmacy benefits program and the infection reduction act.  He felt with these changes by the federal government more patients are going to die because of the high on affordability with the medications.  He was also upset that the letter came from [facility].  He says he gets all his medicines at [facility].  I did explain to him [facility] as a Mud[employer] and not the pharmacy.  In any event I routed these questions to our pharmacy team to evaluate ASAP.  He says that the medicines are expensive even as high as $100 a month he is not going to take these medications.  I did indicate to him that given progressive ILD I did want to discuss other medicines with him but at this point in time I would rather focus on helping him get through his financial difficulties with the medicines.      CT Chest data  No results found.   OV 01/03/2023  Subjective:  Patient ID: Lawrence Wells, male , DOB: 1955-09-10 , age 68 y.o. , MRN: [id number] , ADDRESS: [address] [facility] [id number] PCP Smitty Cords, DO Patient Care Team: Smitty Cords, DO as PCP - General (Family Medicine)  This Provider for this visit: Treatment Team:  Attending Provider: Kalman Shan, MD    01/03/2023 -   Chief Complaint  Patient presents with   Follow-up    Pt states he is well today, but this past week he has had some increased cough- producing some brown sputum. He has also had some episodes of increased DOE.     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   -  Started on nintedanib 10/12/2019 -> took briefly stopped May 2021 with weight  loss and nerever resumed back due to dev ENT cancer/dysphagia and ongoing weight loss even as of April 2022  - Last PFT Jan 2021 -> MARch 2024  - Last CT hest June 2021 -> Jun e 2023 and progressive-> May 2024 with stability  Associated emphysema present on CT scan January 2021   - start sporivia 07/30/2022 (and Rx AECOPD)  Normal cardiac stress test 10/14/2019 Normal echocardiogram September 2023 without pulmonary hypertension.  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 -returns for follow-up.  Presents with his wife.  I am seeing the wife after a long time.  She is an independent historian.  Overall he is stable.  His weight is also stable.  He continues on his Imuran.  He had pulmonary function testing that shows stability compared to a year ago and excited about that.  For his rheumatoid arthritis he is on Imuran.  He is not on prednisone.  But he is taking Plaquenil.  For his pulmonary fibrosis.  In March 2024 he stopped taking pirfenidone after he ran out of the medication.  The significant co-pay issues.  Finally they worked this out and she showed me a Physicist, medical from [employer] where should be between $0 and $6 per month.  However she says that our pharmacy team and our office is working on it and have not heard back from them.  I have sent a message to our pharmacy team to sort this out.  Currently is without pirfenidone and he says his appetite is actually better.        SYMPTOM SCALE - ILD 12/02/2019 On ofev, immuran, rehab  wegt 163# 159# 01/06/2020  03/07/2020 150#, ofev, immuran, pred 7.5 and 4L [place] iwt with exertion.  New dx of R Tonsillar cancer 08/04/2020 144# 12/13/2020 135#  s/p xrt jan 2022 and peg removal feb 2022. Not on ofev  01/17/2022 131# 04/30/2022 On immuran. Not on antifibrotic Start low dose esbriet 07/30/2022 Immuran and low dose esbrit - 135# 10/21/2022 136# 01/03/2023 137#  O2 use ra  ra ra ra ra ra ra ra  Shortness of Breath 0 -> 5 scale with 5 being worst (score 6 If unable to do)          At rest 0  0 0 0 0 0 1 1  Simple tasks - showers, clothes change, eating, shaving 3  5 0 0 0 1 1 1   Household (dishes, doing bed, laundry) 3  5 0 0 1 1 1 2   Shopping 4  5 0 0 1 2 2 2   Walking level at own pace 3.5  5 1  0 0 1 1 2   Walking up Stairs 4 4 6 1 1 1 2 2 2   Total (30-36) Dyspnea Score 18  26 2 1 3 7 8 10   How bad is your cough? 3 2 5  0 0 0 1 2 5   How bad is your fatigue 3  4 2 1  0 0 5 2  How bad is nausea 0  4 0 0 0 0 0 0  How bad is vomiting?  0  0 0 0 0 0 0 0  How bad is diarrhea? 0 4 despite immodium 1 per day 2 0 0 0 0 0 0  How bad is anxiety? 2  2 5  0 0 0 0 0  How bad is depression 2  3 0 0 0 0 2 0  pain  2 0  0 0 0  0        Simple office walk 185 feet x  3 laps goal with forehead probe 09/10/2019  10/22/2019  .03/07/2020   08/04/2020  12/13/2020  01/17/2022  07/30/2022   10/21/2022  01/03/2023   O2 used ra ra - walk, uses 2L portable at home with exerion ra ra  ra ra ra ra  Number laps completed 3 3 3  attempted but did only 1/2 Did all 3  Did all 3 laps Did all 3 las Ddid all 3 Sit stand x 10  Comments about pace mod     Good pace Avg pace    Resting Pulse Ox/HR 98% and 74/min 95% and 71 99% and 85/min 95% and HR 91/min 99% and HR 63 100% and 56/min 97% ad HR 71 95% and HR 66 96% and HR 71  Final Pulse Ox/HR 88% and 96/min 86% and 101 88% a and 97/mi 89% and 107% 86% and HR 81 96% and 72 bpm 91% and HR 85 87% and HR 95 89% and HR 83  Desaturated </= 88% yes yes      yes no  Desaturated <= 3% points Yes, 10 points Yes, 9 pon      yes Yes, 7 poin  Got Tachycardic >/= 90/min yes yes      yes no  Symptoms at end of test Yes "pretty heavy"  yes      dyspnea no  Miscellaneous comments Corrected 2L   Corrected with 4L [place]  Refused o3 Improved Dropped 6 points Dropped 8 points       CT Chest High Resolution  Result Date: 01/02/2023 CLINICAL DATA:  67 year old male history of rheumatoid arthritis with associated lung disease. Follow-up study. EXAM: CT CHEST WITHOUT CONTRAST TECHNIQUE: Multidetector CT imaging of the chest was performed following the standard protocol without intravenous contrast. High resolution imaging of the lungs, as well as inspiratory and expiratory imaging, was performed. RADIATION DOSE REDUCTION: This exam was performed according to the departmental dose-optimization program which includes automated exposure control, adjustment of the mA and/or kV according to patient size and/or use of iterative reconstruction technique. COMPARISON:  High-resolution chest CT 01/18/2022. FINDINGS: Cardiovascular: Heart size is normal. There is no significant pericardial fluid, thickening or pericardial calcification. There is aortic atherosclerosis, as well as atherosclerosis of the great vessels of the mediastinum and the coronary arteries, including calcified atherosclerotic plaque in the left main, left anterior descending, left circumflex and right coronary arteries. Calcifications of the aortic valve. Dilatation of the pulmonic trunk (3.8 cm in diameter). Mediastinum/Nodes: No pathologically enlarged mediastinal or hilar lymph nodes. Esophagus is unremarkable in appearance. No axillary lymphadenopathy. Lungs/Pleura: High-resolution images demonstrate a background of moderate centrilobular and mild paraseptal emphysema. In addition, there are some scattered areas of mild ground-glass attenuation, widespread septal thickening, subpleural reticulation, traction bronchiectasis, peripheral bronchiolectasis and extensive honeycombing. These findings are most evident throughout the mid to lower lungs bilaterally, and appear very similar to the  prior examination from 2023 without definitive progression. Inspiratory and expiratory imaging is unremarkable. No acute consolidative airspace disease. No pleural effusions. No definite suspicious appearing pulmonary nodules or masses are noted. Upper Abdomen: Aortic atherosclerosis. Partially imaged low-attenuation lesion measuring at least 5.1 cm in diameter in the posterior aspect of the interpolar region of the right kidney, incompletely characterized on today's noncontrast CT examination, but statistically likely a cyst (no imaging follow-up recommended). Left extrarenal pelvis (normal anatomical variant), similar to prior studies. Musculoskeletal: There are no aggressive appearing lytic or blastic lesions noted in the visualized portions of the skeleton. IMPRESSION: 1. Stable findings in the lungs once again considered diagnostic of usual interstitial pneumonia (UIP) per current ATS guidelines. No significant  progression compared to the prior study. 2. Moderate centrilobular and mild paraseptal emphysema also noted. 3. Dilatation of the pulmonic trunk (3.8 cm in diameter), concerning for associated pulmonary arterial hypertension. 4. Aortic atherosclerosis, in addition to left main and three-vessel coronary artery disease. Please note that although the presence of coronary artery calcium documents the presence of coronary artery disease, the severity of this disease and any potential stenosis cannot be assessed on this non-gated CT examination. Assessment for potential risk factor modification, dietary therapy or pharmacologic therapy may be warranted, if clinically indicated. 5. There are calcifications of the aortic valve. Echocardiographic correlation for evaluation of potential valvular dysfunction may be warranted if clinically indicated. Aortic Atherosclerosis (ICD10-I70.0) and Emphysema (ICD10-J43.9). Electronically Signed   By: Trudie Reed M.D.   On: 01/02/2023 09:20      PFT     Latest  Ref Rng & Units 10/21/2022    3:24 PM 04/30/2022    9:04 AM 01/17/2022    3:41 PM  PFT Results  FVC-Pre L 4.01  4.01  4.08   FVC-Predicted Pre % 89  89  86   Pre FEV1/FVC % % 73  81  82   FEV1-Pre L 2.92  3.23  3.33   FEV1-Predicted Pre % 87  97  95   DLCO uncorrected ml/min/mmHg 7.85  10.43  9.05   DLCO UNC% % 29  39  33   DLCO corrected ml/min/mmHg 7.85  10.62  9.24   DLCO COR %Predicted % 29  40  33   DLVA Predicted % 32  49  41        has a past medical history of Arthritis, Collagen vascular disease (HCC), COPD (chronic obstructive pulmonary disease) (HCC), Coronary artery disease, Dyspnea, Emphysema lung (HCC), Hyperlipidemia, and 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HCC).   reports that he quit smoking about 17 years ago. His smoking use included cigarettes. He has a 45.00 pack-year smoking history. He quit smokeless tobacco use about 17 years ago.  His smokeless tobacco use included chew.  Past Surgical History:  Procedure Laterality Date   SINUS SURGERY WITH INSTATRAK      Allergies  Allergen Reactions   Atorvastatin Other (See Comments)    Myalgias    Immunization History  Administered Date(s) Administered   Fluad Quad(high Dose 65+) 08/10/2021, 04/30/2022   Influenza, High Dose Seasonal PF 07/28/2020   PFIZER(Purple Top)SARS-COV-2 Vaccination 11/22/2019, 12/15/2019   Pneumococcal Conjugate-13 09/08/2019   Tdap 04/19/2015, 05/07/2017   Zoster Recombinat (Shingrix) 09/29/2019, 11/27/2019    Family History  Problem Relation Age of Onset   Heart disease Mother    Heart attack Mother 42   Arthritis Mother    Heart disease Father    Heart attack Father 81   Healthy Sister    Hyperlipidemia Brother    Throat cancer Brother 60   Heart disease Brother    Healthy Son    Healthy Daughter      Current Outpatient Medications:    azaTHIOprine (IMURAN) 50 MG tablet, Take 2 tablets (100 mg total) by mouth daily., Disp: 180 tablet, Rfl: 0   hydroxychloroquine (PLAQUENIL) 200 MG  tablet, Take 1 tablet by mouth daily., Disp: , Rfl:    meclizine (ANTIVERT) 25 MG tablet, Take 1 tablet (25 mg total) by mouth 3 (three) times daily as needed for dizziness., Disp: 30 tablet, Rfl: 2   Multiple Vitamins-Minerals (CENTRUM SILVER 50+MEN PO), Take by mouth daily., Disp: , Rfl:    rizatriptan (MAXALT-MLT) 10 MG disintegrating  tablet, Take 1 tablet (10 mg total) by mouth as needed for migraine. May repeat in 2 hours if needed, Disp: 10 tablet, Rfl: 0   saw palmetto 160 MG capsule, Take 1 capsule (160 mg total) by mouth 2 (two) times daily., Disp: , Rfl:    Tiotropium Bromide Monohydrate (SPIRIVA RESPIMAT) 2.5 MCG/ACT AERS, Inhale 2 puffs into the lungs daily., Disp: 4 g, Rfl: 5   Tiotropium Bromide Monohydrate (SPIRIVA RESPIMAT) 2.5 MCG/ACT AERS, Inhale 2 puffs into the lungs daily., Disp: 1 each, Rfl: 0   Tiotropium Bromide Monohydrate (SPIRIVA RESPIMAT) 2.5 MCG/ACT AERS, Inhale 2 puffs into the lungs daily., Disp: 4 g, Rfl: 0   Pirfenidone 267 MG CAPS, Take 534 mg by mouth 3 (three) times daily with meals. **low dose as maintenance** (Patient not taking: Reported on 01/03/2023), Disp: 180 capsule, Rfl: 5      Objective:   Vitals:   01/03/23 1346  BP: 124/76  Pulse: 69  Temp: 98.8 F (37.1 C)  TempSrc: Oral  SpO2: 93%  Weight: 137 lb (62.1 kg)  Height: 5\' 11"  (1.803 m)    Estimated body mass index is 19.11 kg/m as calculated from the following:   Height as of this encounter: 5\' 11"  (1.803 m).   Weight as of this encounter: 137 lb (62.1 kg).  @WEIGHTCHANGE @  [employer]   01/03/23 1346  Weight: 137 lb (62.1 kg)     Physical Exam   General: No distress. Look swell. THIN, WIfe with im Neuro: Alert and Oriented x 3. GCS 15. Speech normal Psych: Pleasant Resp:  Barrel Chest - no.  Wheeze - no, Crackles - YES, No overt respiratory distress CVS: Normal heart sounds. Murmurs - no Ext: Stigmata of Connective Tissue Disease - no HEENT: Normal upper airway. PEERL +.  No post nasal drip        Assessment:       ICD-10-CM   1. Interstitial lung disease due to connective tissue disease (HCC)  J84.89 Hepatic function panel   M35.9 Hepatic function panel    B Nat Peptide    2. Rheumatoid arthritis involving multiple sites with positive rheumatoid factor (HCC)  M05.79     3. Medication monitoring encounter  Z51.81     4. Centrilobular emphysema (HCC)  J43.2     5. Patient cannot afford medications  Z59.86          Plan:     Patient Instructions     ICD-10-CM   1. Interstitial lung disease due to connective tissue disease (HCC)  J84.89    M35.9     2. Rheumatoid arthritis involving multiple sites with positive rheumatoid factor (HCC)  M05.79     3. Medication monitoring encounter  Z51.81     4. Centrilobular emphysema (HCC)  J43.2       Emphyesma is stable Pulmonary fibrosis stable x 1 year on CT and symptoms Weight stable    Plan - respect o2 deferral - check LFT, 01/03/2023 -  Please continue spiriva respimat high dose 2 puff daily - take sample, script and show technique - RESTART  pirfenidone/esbriet LOW DOse protocol  - max is low dose of 2 pills three times daily  - take 5-6h apart with food, appply sunscreen and drink water a lot  - sent message to our pharmacy team to address $ issues ASAP - get RSV vaccilne  Followup - 4 months with  DR Marchelle Gearing; - onsite visit - 30 min   - symptom  sscore and walk test  - symptoms score and walk test at followup    SIGNATURE    Dr. Kalman Shan, M.D., F.C.C.P,  Pulmonary and Critical Care Medicine Staff Physician, [facility] Center Director - Interstitial Lung Disease  Program  Pulmonary Fibrosis [facility] at [facility] [place], [place], [address]  Pager: [phone], If no answer or between  15:00h - 7:00h: call [phone] Telephone: [phone]  5:58 PM 01/03/2023

## 2023-01-04 LAB — BRAIN NATRIURETIC PEPTIDE: Brain Natriuretic Peptide: 51 pg/mL (ref <100)

## 2023-01-06 DIAGNOSIS — H43812 Vitreous degeneration, left eye: Secondary | ICD-10-CM | POA: Diagnosis not present

## 2023-01-06 DIAGNOSIS — H2513 Age-related nuclear cataract, bilateral: Secondary | ICD-10-CM | POA: Diagnosis not present

## 2023-01-06 DIAGNOSIS — H0288A Meibomian gland dysfunction right eye, upper and lower eyelids: Secondary | ICD-10-CM | POA: Diagnosis not present

## 2023-01-06 DIAGNOSIS — H0288B Meibomian gland dysfunction left eye, upper and lower eyelids: Secondary | ICD-10-CM | POA: Diagnosis not present

## 2023-01-06 NOTE — Telephone Encounter (Signed)
This has been address in note from today. Lawrence Wells talked to patient's wife. Patient opted out of screening for assistance at [facility] pharmacy. They will reach out if patient decides he wants to restart pirfenidone  [name], PharmD, MPH, BCPS, CPP Clinical Pharmacist (Rheumatology and Pulmonology)

## 2023-01-06 NOTE — Telephone Encounter (Signed)
Thanks

## 2023-01-06 NOTE — Telephone Encounter (Signed)
Spoke with pt's wife regarding current situation, she expresses understanding that the prices they were being quoted by the rep were most likely being stated as if the pt had already paid his way through the donut hole. I recommended reattempting financial assistance screening originally offered by [facility] and was declined, however she states that they make too much money to qualify for the assistance.   I advised her that we are, unfortunately, at the end of the road here in terms of available options and that they would be unlikely to get the medication any cheaper than it currently is. She informs me that her husband remains adamant that he does not want to pay the $147 and will ultimately decide to stop treatment altogether stating that he "doesn't feel like he needs it". I advised that this was of course his decision to make, however I reminded her that they have my phone number and could reach back out to me at any time should he change his mind or if they have any additional questions or concerns they would like to discuss. Mrs. Marchetta verbalized understanding to all.

## 2023-01-10 DIAGNOSIS — H2513 Age-related nuclear cataract, bilateral: Secondary | ICD-10-CM | POA: Diagnosis not present

## 2023-01-10 DIAGNOSIS — H0288A Meibomian gland dysfunction right eye, upper and lower eyelids: Secondary | ICD-10-CM | POA: Diagnosis not present

## 2023-01-10 DIAGNOSIS — H0288B Meibomian gland dysfunction left eye, upper and lower eyelids: Secondary | ICD-10-CM | POA: Diagnosis not present

## 2023-01-10 DIAGNOSIS — H43812 Vitreous degeneration, left eye: Secondary | ICD-10-CM | POA: Diagnosis not present

## 2023-01-28 DIAGNOSIS — M0579 Rheumatoid arthritis with rheumatoid factor of multiple sites without organ or systems involvement: Secondary | ICD-10-CM | POA: Diagnosis not present

## 2023-02-11 DIAGNOSIS — M0579 Rheumatoid arthritis with rheumatoid factor of multiple sites without organ or systems involvement: Secondary | ICD-10-CM | POA: Diagnosis not present

## 2023-02-17 ENCOUNTER — Ambulatory Visit (INDEPENDENT_AMBULATORY_CARE_PROVIDER_SITE_OTHER): Payer: Medicare HMO | Admitting: Internal Medicine

## 2023-02-17 ENCOUNTER — Encounter: Payer: Self-pay | Admitting: Internal Medicine

## 2023-02-17 VITALS — BP 106/64 | HR 93 | Temp 97.3°F | Wt 134.0 lb

## 2023-02-17 DIAGNOSIS — R519 Headache, unspecified: Secondary | ICD-10-CM

## 2023-02-17 DIAGNOSIS — J441 Chronic obstructive pulmonary disease with (acute) exacerbation: Secondary | ICD-10-CM | POA: Diagnosis not present

## 2023-02-17 LAB — POC COVID19 BINAXNOW: SARS Coronavirus 2 Ag: NEGATIVE

## 2023-02-17 MED ORDER — PREDNISONE 10 MG PO TABS: ORAL_TABLET | ORAL | 0 refills | Status: DC

## 2023-02-17 NOTE — Patient Instructions (Signed)

## 2023-02-17 NOTE — Progress Notes (Signed)
HPI  Pt presents to the clinic today with c/o headache, sore throat, and cough.  This started 3 days. The headache is located over his whole head. He describes the pain as pressure or throbbing. He denies difficulty swallowing. The cough is productive of yellow mucous. He denies runny nose, nasal congestion, ear pain or shortness of breath. He denies fever or body aches but has had chills.  He has a history of COPD, CHF and idiopathic pulmonary fibrosis.  He is not taking Spiriva as prescribed. He has tried tylenol and ibuprofen with some relief of symptoms. He does not smoke. He has not had sick contacts that he is aware if.  He follows with pulmonology.  Review of Systems      Past Medical History:  Diagnosis Date   Arthritis    Collagen vascular disease (HCC)    Rhematoid Arthritis   COPD (chronic obstructive pulmonary disease) (HCC)    Coronary artery disease    Dyspnea    Emphysema lung (HCC)    Hyperlipidemia    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HCC)     Family History  Problem Relation Age of Onset   Heart disease Mother    Heart attack Mother 51   Arthritis Mother    Heart disease Father    Heart attack Father 29   Healthy Sister    Hyperlipidemia Brother    Throat cancer Brother 60   Heart disease Brother    Healthy Son    Healthy Daughter     Social History   Socioeconomic History   Marital status: Married    Spouse name: Brewer Leshko   Number of children: Not on file   Years of education: High School   Highest education level: Not on file  Occupational History   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[employer])    Comment: Retired 2012  Tobacco Use   Smoking status: Former    Packs/day: 1.50    Years: 30.00    Additional pack years: 0.00    Total pack years: 45.00    Types: Cigarettes    Quit date: 2007    Years since quitting: 17.5   Smokeless tobacco: Former    Types: Chew    Quit date: 2007   Tobacco comments:    Dip smokeless tobacco >20-30 years  Vaping Use    Vaping Use: Never used  Substance and Sexual Activity   Alcohol use: Not Currently    Comment: occ   Drug use: No   Sexual activity: Not Currently  Other Topics Concern   Not on file  Social History Narrative   Right handed   One story home   Drinks caffeine   Social Determinants of Health   Financial Resource Strain: Low Risk  (03/01/2022)   Overall Financial Resource Strain (CARDIA)    Difficulty of Paying Living Expenses: Not hard at all  Food Insecurity: No Food Insecurity (03/01/2022)   Hunger Vital Sign    Worried About Running Out of Food in the Last Year: Never true    Ran Out of Food in the Last Year: Never true  Transportation Needs: No Transportation Needs (03/01/2022)   PRAPARE - Administrator, Civil Service (Medical): No    Lack of Transportation (Non-Medical): No  Physical Activity: Insufficiently Active (03/01/2022)   Exercise Vital Sign    Days of Exercise per Week: 3 days    Minutes of Exercise per Session: 30 min  Stress: No Stress Concern Present (03/01/2022)  [employer] of Occupational Health - Occupational Stress Questionnaire    Feeling of Stress : Not at all  Social Connections: Moderately Isolated (03/01/2022)   Social Connection and Isolation Panel [NHANES]    Frequency of Communication with Friends and Family: More than three times a week    Frequency of Social Gatherings with Friends and Family: Twice a week    Attends Religious Services: Never    Database administrator or Organizations: No    Attends Banker Meetings: Never    Marital Status: Married  Catering manager Violence: Not At Risk (03/01/2022)   Humiliation, Afraid, Rape, and Kick questionnaire    Fear of Current or Ex-Partner: No    Emotionally Abused: No    Physically Abused: No    Sexually Abused: No    Allergies  Allergen Reactions   Atorvastatin Other (See Comments)    Myalgias     Constitutional: Positive headache. Denies fatigue, fever or  abrupt weight changes.  HEENT:  Positive sore throat. Denies eye redness, eye pain, pressure behind the eyes, facial pain, nasal congestion, ear pain, ringing in the ears, wax buildup, runny nose or bloody nose. Respiratory: Positive cough. Denies difficulty breathing or shortness of breath.  Cardiovascular: Denies chest pain, chest tightness, palpitations or swelling in the hands or feet.   No other specific complaints in a complete review of systems (except as listed in HPI above).  Objective:  BP 106/64 (BP Location: Left Arm, Patient Position: Sitting, Cuff Size: Normal)   Pulse 93   Temp (!) 97.3 F (36.3 C) (Temporal)   Wt 134 lb (60.8 kg)   SpO2 94%   BMI 18.69 kg/m   Wt Readings from Last 3 Encounters:  01/03/23 137 lb (62.1 kg)  10/21/22 136 lb (61.7 kg)  08/23/22 133 lb (60.3 kg)     General: Appears his stated age, chronically ill appearing, in NAD. HEENT: Head: normal shape and size, no sinus tenderness noted; Eyes: sclera white, no icterus, conjunctiva pink; Ears: Tm's gray and intact, normal light reflex; Nose: mucosa pink and moist, septum midline; Throat/Mouth: + PND. Teeth present, mucosa erythematous and moist, no exudate noted, no lesions or ulcerations noted.  Neck: No cervical lymphadenopathy.  Cardiovascular: Normal rate and rhythm. S1,S2 noted.  No murmur, rubs or gallops noted.  Pulmonary/Chest: Normal effort and positive vesicular breath sounds with expiratory wheezing throughout. No respiratory distress. No rales or ronchi noted.       Assessment & Plan:   COPD exacerbation:  Rapid COVID-negative Get some rest and drink plenty of water Do salt water gargles for the sore throat Rx for Pred taper x 6 days No indication for antibiotics at this time  Follow-up with your PCP as previously scheduled.   Nicki Reaper, NP

## 2023-02-25 DIAGNOSIS — M0579 Rheumatoid arthritis with rheumatoid factor of multiple sites without organ or systems involvement: Secondary | ICD-10-CM | POA: Diagnosis not present

## 2023-03-07 ENCOUNTER — Ambulatory Visit (INDEPENDENT_AMBULATORY_CARE_PROVIDER_SITE_OTHER): Payer: Medicare HMO

## 2023-03-07 VITALS — BP 100/62 | Ht 71.0 in | Wt 132.4 lb

## 2023-03-07 DIAGNOSIS — Z0000 Encounter for general adult medical examination without abnormal findings: Secondary | ICD-10-CM | POA: Diagnosis not present

## 2023-03-07 NOTE — Patient Instructions (Signed)
Lawrence Wells , Thank you for taking time to come for your Medicare Wellness Visit. I appreciate your ongoing commitment to your health goals. Please review the following plan we discussed and let me know if I can assist you in the future.   These are the goals we discussed:  Goals      DIET - EAT MORE FRUITS AND VEGETABLES     DIET - INCREASE WATER INTAKE     Patient Stated     02/27/2021, wants to gain weight        This is a list of the screening recommended for you and due dates:  Health Maintenance  Topic Date Due   Colon Cancer Screening  Never done   Pneumonia Vaccine (2 of 2 - PPSV23 or PCV20) 11/03/2019   Flu Shot  03/20/2023   Medicare Annual Wellness Visit  03/06/2024   DTaP/Tdap/Td vaccine (3 - Td or Tdap) 05/08/2027   Hepatitis C Screening  Completed   Zoster (Shingles) Vaccine  Completed   HPV Vaccine  Aged Out   COVID-19 Vaccine  Discontinued    Advanced directives: no  Conditions/risks identified: none  Next appointment: Follow up in one year for your annual wellness visit. 03/12/24 @ 1:00 pm in person  Preventive Care 65 Years and Older, Male  Preventive care refers to lifestyle choices and visits with your health care provider that can promote health and wellness. What does preventive care include? A yearly physical exam. This is also called an annual well check. Dental exams once or twice a year. Routine eye exams. Ask your health care provider how often you should have your eyes checked. Personal lifestyle choices, including: Daily care of your teeth and gums. Regular physical activity. Eating a healthy diet. Avoiding tobacco and drug use. Limiting alcohol use. Practicing safe sex. Taking low doses of aspirin every day. Taking vitamin and mineral supplements as recommended by your health care provider. What happens during an annual well check? The services and screenings done by your health care provider during your annual well check will depend on  your age, overall health, lifestyle risk factors, and family history of disease. Counseling  Your health care provider may ask you questions about your: Alcohol use. Tobacco use. Drug use. Emotional well-being. Home and relationship well-being. Sexual activity. Eating habits. History of falls. Memory and ability to understand (cognition). Work and work Astronomer. Screening  You may have the following tests or measurements: Height, weight, and BMI. Blood pressure. Lipid and cholesterol levels. These may be checked every 5 years, or more frequently if you are over 4 years old. Skin check. Lung cancer screening. You may have this screening every year starting at age 10 if you have a 30-pack-year history of smoking and currently smoke or have quit within the past 15 years. Fecal occult blood test (FOBT) of the stool. You may have this test every year starting at age 45. Flexible sigmoidoscopy or colonoscopy. You may have a sigmoidoscopy every 5 years or a colonoscopy every 10 years starting at age 76. Prostate cancer screening. Recommendations will vary depending on your family history and other risks. Hepatitis C blood test. Hepatitis B blood test. Sexually transmitted disease (STD) testing. Diabetes screening. This is done by checking your blood sugar (glucose) after you have not eaten for a while (fasting). You may have this done every 1-3 years. Abdominal aortic aneurysm (AAA) screening. You may need this if you are a current or former smoker. Osteoporosis. You may be  screened starting at age 15 if you are at high risk. Talk with your health care provider about your test results, treatment options, and if necessary, the need for more tests. Vaccines  Your health care provider may recommend certain vaccines, such as: Influenza vaccine. This is recommended every year. Tetanus, diphtheria, and acellular pertussis (Tdap, Td) vaccine. You may need a Td booster every 10 years. Zoster  vaccine. You may need this after age 5. Pneumococcal 13-valent conjugate (PCV13) vaccine. One dose is recommended after age 84. Pneumococcal polysaccharide (PPSV23) vaccine. One dose is recommended after age 36. Talk to your health care provider about which screenings and vaccines you need and how often you need them. This information is not intended to replace advice given to you by your health care provider. Make sure you discuss any questions you have with your health care provider. Document Released: 09/01/2015 Document Revised: 04/24/2016 Document Reviewed: 06/06/2015 Elsevier Interactive Patient Education  2017 [employer].  Fall Prevention in the Home Falls can cause injuries. They can happen to people of all ages. There are many things you can do to make your home safe and to help prevent falls. What can I do on the outside of my home? Regularly fix the edges of walkways and driveways and fix any cracks. Remove anything that might make you trip as you walk through a door, such as a raised step or threshold. Trim any bushes or trees on the path to your home. Use bright outdoor lighting. Clear any walking paths of anything that might make someone trip, such as rocks or tools. Regularly check to see if handrails are loose or broken. Make sure that both sides of any steps have handrails. Any raised decks and porches should have guardrails on the edges. Have any leaves, snow, or ice cleared regularly. Use sand or salt on walking paths during winter. Clean up any spills in your garage right away. This includes oil or grease spills. What can I do in the bathroom? Use night lights. Install grab bars by the toilet and in the tub and shower. Do not use towel bars as grab bars. Use non-skid mats or decals in the tub or shower. If you need to sit down in the shower, use a plastic, non-slip stool. Keep the floor dry. Clean up any water that spills on the floor as soon as it happens. Remove  soap buildup in the tub or shower regularly. Attach bath mats securely with double-sided non-slip rug tape. Do not have throw rugs and other things on the floor that can make you trip. What can I do in the bedroom? Use night lights. Make sure that you have a light by your bed that is easy to reach. Do not use any sheets or blankets that are too big for your bed. They should not hang down onto the floor. Have a firm chair that has side arms. You can use this for support while you get dressed. Do not have throw rugs and other things on the floor that can make you trip. What can I do in the kitchen? Clean up any spills right away. Avoid walking on wet floors. Keep items that you use a lot in easy-to-reach places. If you need to reach something above you, use a strong step stool that has a grab bar. Keep electrical cords out of the way. Do not use floor polish or wax that makes floors slippery. If you must use wax, use non-skid floor wax. Do not have  throw rugs and other things on the floor that can make you trip. What can I do with my stairs? Do not leave any items on the stairs. Make sure that there are handrails on both sides of the stairs and use them. Fix handrails that are broken or loose. Make sure that handrails are as long as the stairways. Check any carpeting to make sure that it is firmly attached to the stairs. Fix any carpet that is loose or worn. Avoid having throw rugs at the top or bottom of the stairs. If you do have throw rugs, attach them to the floor with carpet tape. Make sure that you have a light switch at the top of the stairs and the bottom of the stairs. If you do not have them, ask someone to add them for you. What else can I do to help prevent falls? Wear shoes that: Do not have high heels. Have rubber bottoms. Are comfortable and fit you well. Are closed at the toe. Do not wear sandals. If you use a stepladder: Make sure that it is fully opened. Do not climb a  closed stepladder. Make sure that both sides of the stepladder are locked into place. Ask someone to hold it for you, if possible. Clearly mark and make sure that you can see: Any grab bars or handrails. First and last steps. Where the edge of each step is. Use tools that help you move around (mobility aids) if they are needed. These include: Canes. Walkers. Scooters. Crutches. Turn on the lights when you go into a dark area. Replace any light bulbs as soon as they burn out. Set up your furniture so you have a clear path. Avoid moving your furniture around. If any of your floors are uneven, fix them. If there are any pets around you, be aware of where they are. Review your medicines with your doctor. Some medicines can make you feel dizzy. This can increase your chance of falling. Ask your doctor what other things that you can do to help prevent falls. This information is not intended to replace advice given to you by your health care provider. Make sure you discuss any questions you have with your health care provider. Document Released: 06/01/2009 Document Revised: 01/11/2016 Document Reviewed: 09/09/2014 Elsevier Interactive Patient Education  2017 [employer].

## 2023-03-07 NOTE — Progress Notes (Signed)
Subjective:   Lawrence Wells is a 67 y.o. male who presents for Medicare Annual/Subsequent preventive examination.  Visit Complete: In person   Review of Systems     Cardiac Risk Factors include: advanced age (>13men, >18 women);male gender     Objective:    Today's Vitals   03/07/23 1028  BP: 100/62  Weight: 132 lb 6.4 oz (60.1 kg)  Height: 5\' 11"  (1.803 m)   Body mass index is 18.47 kg/m.     03/07/2023   10:37 AM 03/01/2022    9:53 AM 02/27/2021    9:42 AM 05/03/2020   10:55 AM 02/07/2020   12:51 PM 01/19/2020    6:44 PM 10/27/2019    8:40 AM  Advanced Directives  Does Patient Have a Medical Advance Directive? No No No Yes No No Yes  Type of Advance Directive       Living will  Does patient want to make changes to medical advance directive?       No - Patient declined  Would patient like information on creating a medical advance directive? No - Patient declined No - Patient declined    No - Patient declined No - Patient declined    Current Medications (verified) Outpatient Encounter Medications as of 03/07/2023  Medication Sig   azaTHIOprine (IMURAN) 50 MG tablet Take 2 tablets (100 mg total) by mouth daily.   hydroxychloroquine (PLAQUENIL) 200 MG tablet Take 1 tablet by mouth daily.   meclizine (ANTIVERT) 25 MG tablet Take 1 tablet (25 mg total) by mouth 3 (three) times daily as needed for dizziness.   Multiple Vitamins-Minerals (CENTRUM SILVER 50+MEN PO) Take by mouth daily.   Pirfenidone 267 MG CAPS Take 534 mg by mouth 3 (three) times daily with meals. **low dose as maintenance**   saw palmetto 160 MG capsule Take 1 capsule (160 mg total) by mouth 2 (two) times daily.   predniSONE (DELTASONE) 10 MG tablet Take 6 tabs on day 1, 5 tabs on day 2, 4 tabs on day 3, 3 tabs on day 4, 2 tabs on day 5, 1 tab on day 6 (Patient not taking: Reported on 03/07/2023)   rizatriptan (MAXALT-MLT) 10 MG disintegrating tablet Take 1 tablet (10 mg total) by mouth as needed for migraine.  May repeat in 2 hours if needed (Patient not taking: Reported on 03/07/2023)   No facility-administered encounter medications on file as of 03/07/2023.    Allergies (verified) Atorvastatin   History: Past Medical History:  Diagnosis Date   Arthritis    Collagen vascular disease (HCC)    Rhematoid Arthritis   COPD (chronic obstructive pulmonary disease) (HCC)    Coronary artery disease    Dyspnea    Emphysema lung (HCC)    Hyperlipidemia    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HCC)    Past Surgical History:  Procedure Laterality Date   SINUS SURGERY WITH INSTATRAK     Family History  Problem Relation Age of Onset   Heart disease Mother    Heart attack Mother 69   Arthritis Mother    Heart disease Father    Heart attack Father 30   Healthy Sister    Hyperlipidemia Brother    Throat cancer Brother 60   Heart disease Brother    Healthy Son    Healthy Daughter    Social History   Socioeconomic History   Marital status: Married    Spouse name: Darryle Dennie   Number of children: Not on file   Years of  education: High School   Highest education level: Not on file  Occupational History   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[employer])    Comment: Retired 2012  Tobacco Use   Smoking status: Former    Current packs/day: 0.00    Average packs/day: 1.5 packs/day for 30.0 years (45.0 ttl pk-yrs)    Types: Cigarettes    Start date: 67    Quit date: 2007    Years since quitting: 17.5   Smokeless tobacco: Former    Types: Chew    Quit date: 2007   Tobacco comments:    Dip smokeless tobacco >20-30 years  Vaping Use   Vaping status: Never Used  Substance and Sexual Activity   Alcohol use: Not Currently    Comment: occ   Drug use: No   Sexual activity: Not Currently  Other Topics Concern   Not on file  Social History Narrative   Right handed   One story home   Drinks caffeine   Social Determinants of Health   Financial Resource Strain: Low Risk  (03/07/2023)   Overall Financial  Resource Strain (CARDIA)    Difficulty of Paying Living Expenses: Not hard at all  Food Insecurity: No Food Insecurity (03/07/2023)   Hunger Vital Sign    Worried About Running Out of Food in the Last Year: Never true    Ran Out of Food in the Last Year: Never true  Transportation Needs: No Transportation Needs (03/07/2023)   PRAPARE - Administrator, Civil Service (Medical): No    Lack of Transportation (Non-Medical): No  Physical Activity: Sufficiently Active (03/07/2023)   Exercise Vital Sign    Days of Exercise per Week: 3 days    Minutes of Exercise per Session: 60 min  Stress: No Stress Concern Present (03/07/2023)   [employer] of Occupational Health - Occupational Stress Questionnaire    Feeling of Stress : Only a little  Social Connections: Moderately Isolated (03/07/2023)   Social Connection and Isolation Panel [NHANES]    Frequency of Communication with Friends and Family: More than three times a week    Frequency of Social Gatherings with Friends and Family: Once a week    Attends Religious Services: Never    Database administrator or Organizations: No    Attends Engineer, structural: Never    Marital Status: Married    Tobacco Counseling Counseling given: Not Answered Tobacco comments: Dip smokeless tobacco >20-30 years   Clinical Intake:  Pre-visit preparation completed: Yes  Pain : No/denies pain     Nutritional Risks: None Diabetes: No  How often do you need to have someone help you when you read instructions, pamphlets, or other written materials from your doctor or pharmacy?: 1 - Never  Interpreter Needed?: No  Information entered by :: Kennedy Bucker, LPN   Activities of Daily Living    03/07/2023   10:38 AM  In your present state of health, do you have any difficulty performing the following activities:  Hearing? 1  Vision? 0  Difficulty concentrating or making decisions? 0  Walking or climbing stairs? 0  Dressing or  bathing? 0  Doing errands, shopping? 0  Preparing Food and eating ? N  Using the Toilet? N  In the past six months, have you accidently leaked urine? N  Do you have problems with loss of bowel control? N  Managing your Medications? N  Managing your Finances? N  Housekeeping or managing your Housekeeping? N  Patient Care Team: Smitty Cords, DO as PCP - General (Family Medicine)  Indicate any recent Medical Services you may have received from other than [facility] providers in the past year (date may be approximate).     Assessment:   This is a routine wellness examination for Zachery.  Hearing/Vision screen Hearing Screening - Comments:: No aids Vision Screening - Comments:: No glasses- Dr.Woodard  Dietary issues and exercise activities discussed:     Goals Addressed             This Visit's Progress    DIET - INCREASE WATER INTAKE         Depression Screen    03/07/2023   10:34 AM 03/01/2022    9:51 AM 12/26/2021    4:07 PM 10/17/2021   11:10 AM 02/27/2021    9:43 AM 12/08/2019    7:36 AM 12/01/2019    8:42 AM  PHQ 2/9 Scores  PHQ - 2 Score 2 0 0 4 0 1 0  PHQ- 9 Score 4 2 6 11  4 4     Fall Risk    03/07/2023   10:38 AM 03/01/2022    9:54 AM 12/26/2021    4:07 PM 10/17/2021   11:10 AM 07/30/2021   11:31 AM  Fall Risk   Falls in the past year? 0 0 0 0 0  Number falls in past yr: 0 1 0 0 0  Injury with Fall? 0 0 0 0 0  Risk for fall due to : No Fall Risks No Fall Risks;History of fall(s) No Fall Risks No Fall Risks   Follow up Falls prevention discussed;Falls evaluation completed Falls prevention discussed;Falls evaluation completed Falls evaluation completed Falls evaluation completed Falls evaluation completed    MEDICARE RISK AT HOME:  Medicare Risk at Home - 03/07/23 1039     Any stairs in or around the home? Yes    If so, are there any without handrails? No    Home free of loose throw rugs in walkways, pet beds, electrical cords, etc? Yes     Adequate lighting in your home to reduce risk of falls? Yes    Life alert? No    Use of a cane, walker or w/c? No    Grab bars in the bathroom? Yes    Shower chair or bench in shower? No    Elevated toilet seat or a handicapped toilet? No             TIMED UP AND GO:  Was the test performed?  Yes  Length of time to ambulate 10 feet: 4 sec Gait steady and fast without use of assistive device    Cognitive Function:        03/07/2023   10:39 AM 03/01/2022    9:55 AM 02/27/2021    9:45 AM  6CIT Screen  What Year? 0 points 0 points 0 points  What month? 0 points 0 points 0 points  What time? 0 points 0 points 0 points  Count back from 20 0 points 0 points 0 points  Months in reverse 0 points 0 points 0 points  Repeat phrase 0 points 0 points 0 points  Total Score 0 points 0 points 0 points    Immunizations Immunization History  Administered Date(s) Administered   Fluad Quad(high Dose 65+) 08/10/2021, 04/30/2022   Influenza, High Dose Seasonal PF 07/28/2020   PFIZER(Purple Top)SARS-COV-2 Vaccination 11/22/2019, 12/15/2019   Pneumococcal Conjugate-13 09/08/2019   Tdap 04/19/2015, 05/07/2017   Zoster  Recombinant(Shingrix) 09/29/2019, 11/27/2019    TDAP status: Up to date  Flu Vaccine status: Up to date  Pneumococcal vaccine status: Up to date  Covid-19 vaccine status: Completed vaccines  Qualifies for Shingles Vaccine? Yes   Zostavax completed No   Shingrix Completed?: Yes  Screening Tests Health Maintenance  Topic Date Due   Colonoscopy  Never done   Pneumonia Vaccine 68+ Years old (2 of 2 - PPSV23 or PCV20) 11/03/2019   INFLUENZA VACCINE  03/20/2023   Medicare Annual Wellness (AWV)  03/06/2024   DTaP/Tdap/Td (3 - Td or Tdap) 05/08/2027   Hepatitis C Screening  Completed   Zoster Vaccines- Shingrix  Completed   HPV VACCINES  Aged Out   COVID-19 Vaccine  Discontinued    Health Maintenance  Health Maintenance Due  Topic Date Due   Colonoscopy  Never  done   Pneumonia Vaccine 79+ Years old (2 of 2 - PPSV23 or PCV20) 11/03/2019    Declined referral for colonoscopy  Lung Cancer Screening: (Low Dose CT Chest recommended if Age 34-80 years, 20 pack-year currently smoking OR have quit w/in 15years.) does not qualify.    Additional Screening:  Hepatitis C Screening: does qualify; Completed 05/07/17  Vision Screening: Recommended annual ophthalmology exams for early detection of glaucoma and other disorders of the eye. Is the patient up to date with their annual eye exam?  Yes  Who is the provider or what is the name of the office in which the patient attends annual eye exams? Dr.Woodard If pt is not established with a provider, would they like to be referred to a provider to establish care? No .   Dental Screening: Recommended annual dental exams for proper oral hygiene   Community Resource Referral / Chronic Care Management: CRR required this visit?  No   CCM required this visit?  No     Plan:     I have personally reviewed and noted the following in the patient's chart:   Medical and social history Use of alcohol, tobacco or illicit drugs  Current medications and supplements including opioid prescriptions. Patient is not currently taking opioid prescriptions. Functional ability and status Nutritional status Physical activity Advanced directives List of other physicians Hospitalizations, surgeries, and ER visits in previous 12 months Vitals Screenings to include cognitive, depression, and falls Referrals and appointments  In addition, I have reviewed and discussed with patient certain preventive protocols, quality metrics, and best practice recommendations. A written personalized care plan for preventive services as well as general preventive health recommendations were provided to patient.     Hal Hope, LPN   4/78/2956   After Visit Summary: (MyChart) Due to this being a telephonic visit, the after visit  summary with patients personalized plan was offered to patient via MyChart   Nurse Notes: none

## 2023-03-18 ENCOUNTER — Other Ambulatory Visit: Payer: Self-pay | Admitting: Otolaryngology

## 2023-03-18 ENCOUNTER — Ambulatory Visit
Admission: RE | Admit: 2023-03-18 | Discharge: 2023-03-18 | Disposition: A | Discharge status: Home / Self Care | Payer: Medicare HMO | Source: Ambulatory Visit | Attending: Otolaryngology | Admitting: Otolaryngology

## 2023-03-18 DIAGNOSIS — R059 Cough, unspecified: Secondary | ICD-10-CM

## 2023-03-18 DIAGNOSIS — Z85818 Personal history of malignant neoplasm of other sites of lip, oral cavity, and pharynx: Secondary | ICD-10-CM | POA: Diagnosis not present

## 2023-03-18 DIAGNOSIS — D3702 Neoplasm of uncertain behavior of tongue: Secondary | ICD-10-CM | POA: Diagnosis not present

## 2023-03-24 DIAGNOSIS — H2511 Age-related nuclear cataract, right eye: Secondary | ICD-10-CM | POA: Diagnosis not present

## 2023-03-24 DIAGNOSIS — H01003 Unspecified blepharitis right eye, unspecified eyelid: Secondary | ICD-10-CM | POA: Diagnosis not present

## 2023-03-24 DIAGNOSIS — H2513 Age-related nuclear cataract, bilateral: Secondary | ICD-10-CM | POA: Diagnosis not present

## 2023-03-24 DIAGNOSIS — H01006 Unspecified blepharitis left eye, unspecified eyelid: Secondary | ICD-10-CM | POA: Diagnosis not present

## 2023-03-24 DIAGNOSIS — H25013 Cortical age-related cataract, bilateral: Secondary | ICD-10-CM | POA: Diagnosis not present

## 2023-04-01 DIAGNOSIS — M0579 Rheumatoid arthritis with rheumatoid factor of multiple sites without organ or systems involvement: Secondary | ICD-10-CM | POA: Diagnosis not present

## 2023-04-18 ENCOUNTER — Ambulatory Visit: Payer: Self-pay

## 2023-04-18 ENCOUNTER — Telehealth: Payer: Self-pay

## 2023-04-18 NOTE — Telephone Encounter (Signed)
I would encourage him to schedule appoint with his PCP.  He has not seen his PCP in over a year for follow-up on chronic medical issues.  Referral can be placed at that time.

## 2023-04-18 NOTE — Telephone Encounter (Signed)
     Chief Complaint: Had SOB "this week working out in the heat. I'd like Dr. Kirtland Bouchard to get me in with a new lung doctor."  Symptoms: Above Frequency: This week Pertinent Negatives: Patient denies any symptoms today. Disposition: [] ED /[] Urgent Care (no appt availability in office) / [x] Appointment(In office/virtual)/ []  [facility]/ [] Home Care/ [] Refused Recommended Disposition /[] [facility]/ []  Follow-up with PCP Additional Notes: Pt. Agrees with appointment. Instructed to call back for worsening of symptoms.  Reason for Disposition  [1] MILD longstanding difficulty breathing AND [2]  SAME as normal  Answer Assessment - Initial Assessment Questions 1. RESPIRATORY STATUS: "Describe your breathing?" (e.g., wheezing, shortness of breath, unable to speak, severe coughing)      SOB 2. ONSET: "When did this breathing problem begin?"      This week out in the heat it has been has worse 3. PATTERN "Does the difficult breathing come and go, or has it been constant since it started?"      Comes and goes 4. SEVERITY: "How bad is your breathing?" (e.g., mild, moderate, severe)    - MILD: No SOB at rest, mild SOB with walking, speaks normally in sentences, can lie down, no retractions, pulse < 100.    - MODERATE: SOB at rest, SOB with minimal exertion and prefers to sit, cannot lie down flat, speaks in phrases, mild retractions, audible wheezing, pulse 100-120.    - SEVERE: Very SOB at rest, speaks in single words, struggling to breathe, sitting hunched forward, retractions, pulse > 120      Mild 5. RECURRENT SYMPTOM: "Have you had difficulty breathing before?" If Yes, ask: "When was the last time?" and "What happened that time?"      No 6. CARDIAC HISTORY: "Do you have any history of heart disease?" (e.g., heart attack, angina, bypass surgery, angioplasty)      Yes 7. LUNG HISTORY: "Do you have any history of lung disease?"  (e.g., pulmonary embolus, asthma, emphysema)      Yes 8. CAUSE: "What do you think is causing the breathing problem?"      Heat 9. OTHER SYMPTOMS: "Do you have any other symptoms? (e.g., dizziness, runny nose, cough, chest pain, fever)     No 10. O2 SATURATION MONITOR:  "Do you use an oxygen saturation monitor (pulse oximeter) at home?" If Yes, ask: "What is your reading (oxygen level) today?" "What is your usual oxygen saturation reading?" (e.g., 95%)       No 11. PREGNANCY: "Is there any chance you are pregnant?" "When was your last menstrual period?"       N/a 12. TRAVEL: "Have you traveled out of the country in the last month?" (e.g., travel history, exposures)       No  Protocols used: Breathing Difficulty-A-AH

## 2023-04-18 NOTE — Telephone Encounter (Signed)
Apt 04/25/2023   Thanks,   -Vernona Rieger

## 2023-04-18 NOTE — Telephone Encounter (Signed)
Copied from CRM [id number]. Topic: General - Other >> Apr 18, 2023  9:20 AM Turkey B wrote: Reason for CRM: pt called in staes he hasn't heard anything from his lung Dr and he feels like he isnt getting the care from him like he should. I have connected him to NT for his heavy breathing he has been having. >> Apr 18, 2023 10:05 AM Reeves Forth wrote: Additional notes from second review of call -- the pt was assessed by nurse triage.  The pt stated he does not feel he is receiving appropriate care and concern by his current pulmonary provider and thinks he needs a referral to another provider who will take his case and concerns more seriously.  States he does not have a future visit with current pulmonary provider.

## 2023-04-25 ENCOUNTER — Ambulatory Visit (INDEPENDENT_AMBULATORY_CARE_PROVIDER_SITE_OTHER): Payer: Medicare HMO | Admitting: Family Medicine

## 2023-04-25 ENCOUNTER — Encounter: Payer: Self-pay | Admitting: Family Medicine

## 2023-04-25 VITALS — BP 120/76 | HR 54 | Ht 71.0 in | Wt 134.4 lb

## 2023-04-25 DIAGNOSIS — J849 Interstitial pulmonary disease, unspecified: Secondary | ICD-10-CM

## 2023-04-25 DIAGNOSIS — Z23 Encounter for immunization: Secondary | ICD-10-CM | POA: Diagnosis not present

## 2023-04-25 DIAGNOSIS — J84112 Idiopathic pulmonary fibrosis: Secondary | ICD-10-CM | POA: Diagnosis not present

## 2023-04-25 DIAGNOSIS — L219 Seborrheic dermatitis, unspecified: Secondary | ICD-10-CM | POA: Diagnosis not present

## 2023-04-25 MED ORDER — KETOCONAZOLE 2 % EX SHAM
1.0000 | MEDICATED_SHAMPOO | CUTANEOUS | 1 refills | Status: DC
Start: 2023-04-28 — End: 2024-02-17

## 2023-04-25 MED ORDER — CLOBETASOL PROPIONATE 0.05 % EX SOLN
1.0000 | Freq: Every day | CUTANEOUS | 1 refills | Status: DC
Start: 2023-04-25 — End: 2024-02-17

## 2023-04-25 NOTE — Patient Instructions (Addendum)
Thank you for coming to the office today.  New referral to switch your Pulmonary care to local office.  [facility] at [facility] [address] [place],  [place]  [address] Main: [phone]  Recommending Dr Aundria Rud  Hopefully they can work with you on the Rx medication and coverage program. If needed, we have our own clinical pharmacist Estelle Grumbles.  High dose Flu shot today  Prevnar 20 , pneumonia vaccine today  For scalp dermatitis  Topical Anti fungal ketoconazole shampoo 2-3 times per week for 4 weeks.  If that is ineffective, you can go ahead and add in the solution Steroid / cortisone type solution for the scalp and or back.  Please schedule a Follow-up Appointment to: Return in about 3 months (around 07/25/2023) for 3 month Annual Physical AM visit, labs after.  If you have any other questions or concerns, please feel free to call the office or send a message through MyChart. You may also schedule an earlier appointment if necessary.  Additionally, you may be receiving a survey about your experience at our office within a few days to 1 week by e-mail or mail. We value your feedback.  Saralyn Pilar, DO [facility], [place]

## 2023-04-25 NOTE — Progress Notes (Signed)
Subjective:    Patient ID: Lawrence Wells, male    DOB: 25-Apr-1956, 67 y.o.   MRN: [id number]  Lawrence Wells is a 67 y.o. male presenting on 04/25/2023 for COPD (Has had some problems breathing recently and would like to discuss a new referral for pulmonary)   HPI  ILD / Chronic  Followed by [facility] Pul[facility]ology currently w Dr Dia Sitter Now patient prefers to relocate Pulmonology over to [facility], to receive his care locally and request another opinion He has been waiting for Esbriet therapy but it was unable to be arranged, and still waiting on this  Followed by Rheumatology Dr Allena Katz With infusion therapy  Scalp Deborrheic Dermatitis Itchy dry flaky Tried OTC options limited relief  Health Maintenance: Due for Flu Vaccine high dose Due for Prevnar 20     04/25/2023    8:09 AM 03/07/2023   10:34 AM 03/01/2022    9:51 AM  Depression screen PHQ 2/9  Decreased Interest 0 1 0  Down, Depressed, Hopeless 1 1 0  PHQ - 2 Score 1 2 0  Altered sleeping 1 1 1   Tired, decreased energy 0 1 1  Change in appetite 0  0  Feeling bad or failure about yourself  1  0  Trouble concentrating 0  0  Moving slowly or fidgety/restless 0  0  Suicidal thoughts   0  PHQ-9 Score 3 4 2   Difficult doing work/chores Not difficult at all Not difficult at all Not difficult at all    Social History   Tobacco Use   Smoking status: Former    Current packs/day: 0.00    Average packs/day: 1.5 packs/day for 30.0 years (45.0 ttl pk-yrs)    Types: Cigarettes    Start date: 74    Quit date: 2007    Years since quitting: 17.6   Smokeless tobacco: Former    Types: Chew    Quit date: 2007   Tobacco comments:    Dip smokeless tobacco >20-30 years  Vaping Use   Vaping status: Never Used  Substance Use Topics   Alcohol use: Not Currently    Comment: occ   Drug use: No    Review of Systems Per HPI unless specifically indicated above     Objective:    BP 120/76   Pulse (!) 54   Ht 5\' 11"   (1.803 m)   Wt 134 lb 6.4 oz (61 kg)   SpO2 98%   BMI 18.74 kg/m   Wt Readings from Last 3 Encounters:  04/25/23 134 lb 6.4 oz (61 kg)  03/07/23 132 lb 6.4 oz (60.1 kg)  02/17/23 134 lb (60.8 kg)    Physical Exam Vitals and nursing note reviewed.  Constitutional:      General: He is not in acute distress.    Appearance: He is well-developed. He is not diaphoretic.     Comments: Well-appearing, comfortable, cooperative  HENT:     Head: Normocephalic and atraumatic.  Eyes:     General:        Right eye: No discharge.        Left eye: No discharge.     Conjunctiva/sclera: Conjunctivae normal.  Neck:     Thyroid: No thyromegaly.  Cardiovascular:     Rate and Rhythm: Normal rate and regular rhythm.     Pulses: Normal pulses.     Heart sounds: Normal heart sounds. No murmur heard. Pulmonary:     Effort: Pulmonary effort is normal. No respiratory  distress.     Breath sounds: Normal breath sounds. No wheezing or rales.     Comments: Reduced air movement at baseline Musculoskeletal:        General: Normal range of motion.     Cervical back: Normal range of motion and neck supple.  Lymphadenopathy:     Cervical: No cervical adenopathy.  Skin:    General: Skin is warm and dry.     Findings: No erythema or rash.  Neurological:     Mental Status: He is alert and oriented to person, place, and time. Mental status is at baseline.  Psychiatric:        Behavior: Behavior normal.     Comments: Well groomed, good eye contact, normal speech and thoughts      I have personally reviewed the radiology report from 03/18/23 on Chest X-ray.  CLINICAL DATA:  Cough 3-4 weeks.   EXAM: CHEST - 2 VIEW   COMPARISON:  07/09/2022.  CT, 01/01/2023.   FINDINGS: Cardiac silhouette is normal in size and configuration. No mediastinal or hilar masses. No evidence of adenopathy.   Lungs are hyperexpanded. Coarse interstitial thickening noted bilaterally, unchanged consistent with chronic  interstitial lung disease. No evidence of pneumonia or pulmonary edema.   No pleural effusion or pneumothorax.   Skeletal structures are intact.   IMPRESSION: 1. No acute cardiopulmonary disease. 2. Chronic interstitial lung disease.     Electronically Signed   By: Amie Portland M.D.   On: 03/25/2023 09:26  ----------------------------------------------------  CLINICAL DATA:  67 year old male history of rheumatoid arthritis with associated lung disease. Follow-up study.   EXAM: CT CHEST WITHOUT CONTRAST   TECHNIQUE: Multidetector CT imaging of the chest was performed following the standard protocol without intravenous contrast. High resolution imaging of the lungs, as well as inspiratory and expiratory imaging, was performed.   RADIATION DOSE REDUCTION: This exam was performed according to the departmental dose-optimization program which includes automated exposure control, adjustment of the mA and/or kV according to patient size and/or use of iterative reconstruction technique.   COMPARISON:  High-resolution chest CT 01/18/2022.   FINDINGS: Cardiovascular: Heart size is normal. There is no significant pericardial fluid, thickening or pericardial calcification. There is aortic atherosclerosis, as well as atherosclerosis of the great vessels of the mediastinum and the coronary arteries, including calcified atherosclerotic plaque in the left main, left anterior descending, left circumflex and right coronary arteries. Calcifications of the aortic valve. Dilatation of the pulmonic trunk (3.8 cm in diameter).   Mediastinum/Nodes: No pathologically enlarged mediastinal or hilar lymph nodes. Esophagus is unremarkable in appearance. No axillary lymphadenopathy.   Lungs/Pleura: High-resolution images demonstrate a background of moderate centrilobular and mild paraseptal emphysema. In addition, there are some scattered areas of mild ground-glass attenuation, widespread  septal thickening, subpleural reticulation, traction bronchiectasis, peripheral bronchiolectasis and extensive honeycombing. These findings are most evident throughout the mid to lower lungs bilaterally, and appear very similar to the prior examination from 2023 without definitive progression. Inspiratory and expiratory imaging is unremarkable. No acute consolidative airspace disease. No pleural effusions. No definite suspicious appearing pulmonary nodules or masses are noted.   Upper Abdomen: Aortic atherosclerosis. Partially imaged low-attenuation lesion measuring at least 5.1 cm in diameter in the posterior aspect of the interpolar region of the right kidney, incompletely characterized on today's noncontrast CT examination, but statistically likely a cyst (no imaging follow-up recommended). Left extrarenal pelvis (normal anatomical variant), similar to prior studies.   Musculoskeletal: There are no aggressive appearing lytic or blastic  lesions noted in the visualized portions of the skeleton.   IMPRESSION: 1. Stable findings in the lungs once again considered diagnostic of usual interstitial pneumonia (UIP) per current ATS guidelines. No significant progression compared to the prior study. 2. Moderate centrilobular and mild paraseptal emphysema also noted. 3. Dilatation of the pulmonic trunk (3.8 cm in diameter), concerning for associated pulmonary arterial hypertension. 4. Aortic atherosclerosis, in addition to left main and three-vessel coronary artery disease. Please note that although the presence of coronary artery calcium documents the presence of coronary artery disease, the severity of this disease and any potential stenosis cannot be assessed on this non-gated CT examination. Assessment for potential risk factor modification, dietary therapy or pharmacologic therapy may be warranted, if clinically indicated. 5. There are calcifications of the aortic valve.  Echocardiographic correlation for evaluation of potential valvular dysfunction may be warranted if clinically indicated.   Aortic Atherosclerosis (ICD10-I70.0) and Emphysema (ICD10-J43.9).     Electronically Signed   By: Trudie Reed M.D.   On: 01/02/2023 09:20   Results for orders placed or performed in visit on 02/17/23  POC COVID-19  Result Value Ref Range   SARS Coronavirus 2 Ag Negative Negative      Assessment & Plan:   Problem List Items Addressed This Visit     ILD (interstitial lung disease) (HCC) - Primary   Relevant Orders   Ambulatory referral to Pulmonology   IPF (idiopathic pulmonary fibrosis) (HCC)   Relevant Orders   Ambulatory referral to Pulmonology   Other Visit Diagnoses     Needs flu shot       Relevant Orders   Flu Vaccine Trivalent High Dose (Fluad) (Completed)   Need for pneumococcal vaccination       Relevant Orders   Pneumococcal conjugate vaccine 20-valent (Completed)   Seborrheic dermatitis of scalp       Relevant Medications   ketoconazole (NIZORAL) 2 % shampoo (Start on 04/28/2023)   clobetasol (TEMOVATE) 0.05 % external solution       Concern that patient was not able to get the Esbriet therapy, no further updates. New referral to switch your Pulmonary care to local office. Recommending Dr Aundria Rud  [facility] Pulmonary Care at [facility] [address] [place],  [place]  [address] Main: [phone]  Hopefully they can work with you on the Rx medication and coverage program. If needed, we have our own clinical pharmacist Estelle Grumbles assist.  High dose Flu shot today  Prevnar 20 , pneumonia vaccine today  For scalp dermatitis  Topical Anti fungal ketoconazole shampoo 2-3 times per week for 4 weeks.  If that is ineffective, you can go ahead and add in the solution Steroid / cortisone type solution for the scalp and or back.   Orders Placed This Encounter  Procedures   Pneumococcal conjugate  vaccine 20-valent   Flu Vaccine Trivalent High Dose (Fluad)   Ambulatory referral to Pulmonology    Referral Priority:   Routine    Referral Type:   Consultation    Referral Reason:   Specialty Services Required    Requested Specialty:   Pulmonary Disease    Number of Visits Requested:   1     Meds ordered this encounter  Medications   ketoconazole (NIZORAL) 2 % shampoo    Sig: Apply 1 Application topically 2 (two) times a week. For scalp. For up to 4 weeks, may increase to 3 times per week if needed.    Dispense:  120 mL    Refill:  1   clobetasol (TEMOVATE) 0.05 % external solution    Sig: Apply 1 Application topically daily. Apply to back or non sensitive skin, avoid face. Use for up to 3 to 4 weeks.    Dispense:  50 mL    Refill:  1      Follow up plan: Return in about 3 months (around 07/25/2023) for 3 month Annual Physical AM visit, labs after.   Saralyn Pilar, DO [facility] 04/25/2023, 8:12 AM

## 2023-04-28 ENCOUNTER — Encounter: Payer: Self-pay | Admitting: Student in an Organized Health Care Education/Training Program

## 2023-04-28 ENCOUNTER — Ambulatory Visit (INDEPENDENT_AMBULATORY_CARE_PROVIDER_SITE_OTHER): Payer: Medicare HMO | Admitting: Student in an Organized Health Care Education/Training Program

## 2023-04-28 VITALS — BP 100/70 | HR 65 | Temp 97.6°F | Ht 71.0 in | Wt 135.0 lb

## 2023-04-28 DIAGNOSIS — J432 Centrilobular emphysema: Secondary | ICD-10-CM | POA: Diagnosis not present

## 2023-04-28 DIAGNOSIS — J8489 Other specified interstitial pulmonary diseases: Secondary | ICD-10-CM

## 2023-04-28 DIAGNOSIS — M359 Systemic involvement of connective tissue, unspecified: Secondary | ICD-10-CM | POA: Diagnosis not present

## 2023-04-28 DIAGNOSIS — M0579 Rheumatoid arthritis with rheumatoid factor of multiple sites without organ or systems involvement: Secondary | ICD-10-CM | POA: Diagnosis not present

## 2023-04-28 NOTE — Telephone Encounter (Signed)
Patient in office. He stated that Pirfenidone was not affordable. According to below message PA has been approved until 08/19/2023.   Pharmacy team, can you assist with this?

## 2023-04-28 NOTE — Telephone Encounter (Signed)
There is a more recent note addressing this in May 2024 - pt deferred on any referral for assistance from [employer]. He does not quality for PAP or grants. If patient interested in restarting, they need to contact [facility] and re-initiate this process.   Patient was unwilling to pay $147 for month supply of medication and w medication being generic, we are unable to get it any cheaper for him  [name], PharmD, MPH, BCPS, CPP Clinical Pharmacist (Rheumatology and Pulmonology)

## 2023-04-28 NOTE — Progress Notes (Signed)
Synopsis: Referred in for CTD-ILD by Saralyn Pilar *  Assessment & Plan:   1.  Interstitial lung disease due to connective tissue disease (HCC) 2.  COPD with emphysema 3.  Rheumatoid arthritis  Patient has a history of connective tissue disease associated ILD secondary to rheumatoid arthritis for which he is followed by Dr. Allena Katz from [facility] rheumatology. He was previously followed by Dr. Marchelle Gearing at our interstitial lung disease center.  He is currently immunosuppressed with azathioprine and hydroxychloroquine with initiation of a monoclonal antibody against TNF alpha (Certolizumab). Patient was prescribed pirfenidone but was unable to afford it.  He was previously placed on nintedanib which was discontinued a couple years ago.  He had high-resolution CT scans of the chest in May 2024, June 2023, and June 2021.  The most recent CT scan of the chest in May 2024 showed stable findings consistent with UIP as well as centrilobular and paraseptal emphysema.  His last pulmonary function test was from March 2024 showing normal spirometry but with a significantly decreased DLCO (29%).  PFTs prior to that in September 2023 had shown a DLCO of 39% predicted.  Patient has had stable CTD-ILD secondary to rheumatoid arthritis that is currently managed with immunosuppression with azathioprine, hydroxychloroquine, and TNF alpha blockers.  Review of the literature does not show strong evidence to suggest the use of one immune suppressing agent over another but overall consensus appears to be towards disease modification and control with immune suppression. Methotrexate is preferably avoided given potential for pneumotoxicity.  One study looked at the use of azathioprine versus mycophenolate versus rituximab versus placebo and showed stability in FVC with the use of any of the immune suppressing agents when compared to placebo without difference between groups.  Other studies have looked at the  use of antifibrotic such as pirfenidone or nintedanib and CTD-ILD and there seems to be a (not overwhelmingly strong) signal in the data  for benefit (not based on RCT's).  The RELIEF study looking at the use of pirfenidone in CTD-ILD patients was halted secondary to futility due to inability to recruit enough patients).  It is also postulated that pirfenidone has a positive effect on the course of disease by decreasing TNF alpha.  Similarly, the use of anti-TNF alpha might be beneficial though concern for pneumotoxicity remains.  Patient is currently maintained on optimal immune suppression with the recent initiation of anti-TNF-Alpha agent but is not on anti-fibrotic therapy given financial difficulty. We will attempt to engage with pharmacy and assess the reason and barrier to medication delivery to [place] [facility]. Should his disease activity accelerate, I will reach out to rheumatology and consider switching DMARD to mycophenolate.  I will see him in follow-up in 6 months with repeat pulmonary function tests and a 6-minute walk test.  Should he restart on pirfenidone, I will also obtain liver function testing.  At this point, I do not see a reason to repeat his high-resolution CT scan of the chest given stability.  - Pulmonary Function Test [facility] Only; Future - 6 minute walk; Future   Return in about 6 months (around 10/26/2023).  I spent 50 minutes caring for this patient today, including preparing to see the patient, obtaining a medical history , reviewing a separately obtained history, performing a medically appropriate examination and/or evaluation, counseling and educating the patient/family/caregiver, ordering medications, tests, or procedures, referring and communicating with other health care professionals (not separately reported), documenting clinical information in the electronic health record, and independently interpreting  results (not separately reported/billed) and communicating results to  the patient/family/caregiver  Raechel Chute, MD [facility] 04/28/2023 2:49 PM    End of visit medications:  No orders of the defined types were placed in this encounter.    Current Outpatient Medications:    azaTHIOprine (IMURAN) 50 MG tablet, Take 2 tablets (100 mg total) by mouth daily., Disp: 180 tablet, Rfl: 0   clobetasol (TEMOVATE) 0.05 % external solution, Apply 1 Application topically daily. Apply to back or non sensitive skin, avoid face. Use for up to 3 to 4 weeks., Disp: 50 mL, Rfl: 1   hydroxychloroquine (PLAQUENIL) 200 MG tablet, Take 1 tablet by mouth daily., Disp: , Rfl:    ketoconazole (NIZORAL) 2 % shampoo, Apply 1 Application topically 2 (two) times a week. For scalp. For up to 4 weeks, may increase to 3 times per week if needed., Disp: 120 mL, Rfl: 1   Multiple Vitamins-Minerals (CENTRUM SILVER 50+MEN PO), Take by mouth daily., Disp: , Rfl:    rizatriptan (MAXALT-MLT) 10 MG disintegrating tablet, Take 1 tablet (10 mg total) by mouth as needed for migraine. May repeat in 2 hours if needed, Disp: 10 tablet, Rfl: 0   meclizine (ANTIVERT) 25 MG tablet, Take 1 tablet (25 mg total) by mouth 3 (three) times daily as needed for dizziness. (Patient not taking: Reported on 04/25/2023), Disp: 30 tablet, Rfl: 2   Pirfenidone 267 MG CAPS, Take 534 mg by mouth 3 (three) times daily with meals. **low dose as maintenance** (Patient not taking: Reported on 03/07/2023), Disp: 180 capsule, Rfl: 5   Subjective:   PATIENT ID: [facility] GENDER: male DOB: [birth year], MRN: [id number]  Chief Complaint  Patient presents with   Follow-up    Patient denies any cough, shortness of breath or wheezing.     HPI  The patient is a pleasant 67 year old male with a past medical history of connective tissue disease associated interstitial lung disease followed previously by Dr. Marchelle Gearing who is transitioning his care over to our [place] office given difficulty with  transportation.  Patient was last seen by Dr. Marchelle Gearing in May 2024 for his CTD-ILD diagnosed in 2021 following an abnormal chest x-ray.  At that time, the patient was significantly short of breath and was requiring oxygen.  During previous visits with Dr. Marchelle Gearing, he was initiated on nintedanib and did also establish care with rheumatology for rheumatoid arthritis and was started on immunosuppression.  His course was complicated by difficulty swallowing/dysphagia for which he was seen by his primary care physician and subsequently referred to ENT.  He was diagnosed with tonsillar cancer, treated with chemoradiation in 2021, and is now now in remission. His PEG tube was removed in February 2022.  He was evaluated for lung transplant at [facility] but this was abandoned secondary to his cancer diagnosis.  He discontinued his oxygen around the same time in 2022.  Following his most recent visit with Dr. Marchelle Gearing in May 2024, he was restarted on pirfenidone which he tells me he is unable to afford due to a significant co-pay.  He only took it for 1 month and subsequently stopped it.  He does follow closely with rheumatology (Dr. Allena Katz at [facility] rheumatology) and is maintained on immunosuppression with azathioprine 100 mg daily, hydroxychloroquine 200 mg daily, and Certolizumab (anti-TNF-Alpha monoclonal antibody).   Interim history shows symptoms to be stable.  He feels that his shortness of breath has been unchanged over the past couple of years and  he is able to do plenty of activity (albeit slowly) without limitation.  He does feel shortness of breath when he attempts to mow the lawn or do strenuous physical activity.  He denies any cough, chest pain, sputum production, wheezing, or chest tightness.   Patient reports history of smoking cigarettes between 1983 and 2007 but between 1 and 2 packs/day.  He has no history of vaping or MJ use.  He previously worked in the [employer] for around 30 years with a lot of dust exposure and work in damp air conditioned spaces.  He is also worked in the [employer].  Ancillary information including prior medications, full medical/surgical/family/social histories, and PFTs (when available) are listed below and have been reviewed.   Review of Systems  Constitutional:  Negative for chills, fever and weight loss.  Respiratory:  Positive for shortness of breath. Negative for cough, hemoptysis, sputum production and wheezing.   Cardiovascular:  Negative for chest pain.  All other systems reviewed and are negative.    Objective:   Vitals:   04/28/23 1408  BP: 100/70  Pulse: 65  Temp: 97.6 F (36.4 C)  TempSrc: Temporal  SpO2: 100%  Weight: 135 lb (61.2 kg)  Height: 5\' 11"  (1.803 m)   100% on RA  BMI Readings from Last 3 Encounters:  04/28/23 18.83 kg/m  04/25/23 18.74 kg/m  03/07/23 18.47 kg/m   Wt Readings from Last 3 Encounters:  04/28/23 135 lb (61.2 kg)  04/25/23 134 lb 6.4 oz (61 kg)  03/07/23 132 lb 6.4 oz (60.1 kg)    Physical Exam Constitutional:      Appearance: Normal appearance.  Cardiovascular:     Rate and Rhythm: Normal rate and regular rhythm.     Pulses: Normal pulses.     Heart sounds: Normal heart sounds.  Pulmonary:     Effort: No respiratory distress.     Breath sounds: Rales present. No wheezing or rhonchi.  Neurological:     General: No focal deficit present.     Mental Status: He is alert and oriented to person, place, and time. Mental status is at baseline.       Ancillary Information    Past Medical History:  Diagnosis Date   Arthritis    Collagen vascular disease (HCC)    Rhematoid Arthritis   COPD (chronic obstructive pulmonary disease) (HCC)    Coronary artery disease    Dyspnea    Emphysema lung (HCC)    Hyperlipidemia    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HCC)      Family History  Problem Relation Age of Onset   Heart disease Mother    Heart attack  Mother 61   Arthritis Mother    Heart disease Father    Heart attack Father 54   Healthy Sister    Hyperlipidemia Brother    Throat cancer Brother 60   Heart disease Brother    Healthy Son    Healthy Daughter      Past Surgical History:  Procedure Laterality Date   SINUS SURGERY WITH INSTATRAK      Social History   Socioeconomic History   Marital status: Married    Spouse name: Chartered certified accountant   Number of children: Not on file   Years of education: High School   Highest education level: Not on file  Occupational History   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[employer])    Comment: Retired 2012  Tobacco Use   Smoking status: Former  Current packs/day: 0.00    Average packs/day: 1.5 packs/day for 30.0 years (45.0 ttl pk-yrs)    Types: Cigarettes    Start date: 55    Quit date: 2007    Years since quitting: 17.7   Smokeless tobacco: Former    Types: Chew    Quit date: 2007   Tobacco comments:    Dip smokeless tobacco >20-30 years  Vaping Use   Vaping status: Never Used  Substance and Sexual Activity   Alcohol use: Not Currently    Comment: occ   Drug use: No   Sexual activity: Not Currently  Other Topics Concern   Not on file  Social History Narrative   Right handed   One story home   Drinks caffeine   Social Determinants of Health   Financial Resource Strain: Low Risk  (03/07/2023)   Overall Financial Resource Strain (CARDIA)    Difficulty of Paying Living Expenses: Not hard at all  Food Insecurity: No Food Insecurity (03/07/2023)   Hunger Vital Sign    Worried About Running Out of Food in the Last Year: Never true    Ran Out of Food in the Last Year: Never true  Transportation Needs: No Transportation Needs (03/07/2023)   PRAPARE - Administrator, Civil Service (Medical): No    Lack of Transportation (Non-Medical): No  Physical Activity: Sufficiently Active (03/07/2023)   Exercise Vital Sign    Days of Exercise per Week: 3 days    Minutes of Exercise  per Session: 60 min  Stress: No Stress Concern Present (03/07/2023)   [employer] of Occupational Health - Occupational Stress Questionnaire    Feeling of Stress : Only a little  Social Connections: Moderately Isolated (03/07/2023)   Social Connection and Isolation Panel [NHANES]    Frequency of Communication with Friends and Family: More than three times a week    Frequency of Social Gatherings with Friends and Family: Once a week    Attends Religious Services: Never    Database administrator or Organizations: No    Attends Banker Meetings: Never    Marital Status: Married  Catering manager Violence: Not At Risk (03/07/2023)   Humiliation, Afraid, Rape, and Kick questionnaire    Fear of Current or Ex-Partner: No    Emotionally Abused: No    Physically Abused: No    Sexually Abused: No     Allergies  Allergen Reactions   Atorvastatin Other (See Comments)    Myalgias     CBC    Component Value Date/Time   WBC 6.4 04/30/2022 1215   RBC 4.16 (L) 04/30/2022 1215   HGB 14.3 04/30/2022 1215   HCT 42.5 04/30/2022 1215   PLT 254.0 04/30/2022 1215   MCV 102.2 (H) 04/30/2022 1215   MCH 31.9 01/23/2020 0202   MCHC 33.7 04/30/2022 1215   RDW 14.4 04/30/2022 1215   LYMPHSABS 0.7 04/30/2022 1215   MONOABS 0.9 04/30/2022 1215   EOSABS 0.1 04/30/2022 1215   BASOSABS 0.1 04/30/2022 1215    Pulmonary Functions Testing Results:    Latest Ref Rng & Units 10/21/2022    3:24 PM 04/30/2022    9:04 AM 01/17/2022    3:41 PM  PFT Results  FVC-Pre L 4.01  4.01  4.08   FVC-Predicted Pre % 89  89  86   Pre FEV1/FVC % % 73  81  82   FEV1-Pre L 2.92  3.23  3.33   FEV1-Predicted Pre % 87  97  95   DLCO uncorrected ml/min/mmHg 7.85  10.43  9.05   DLCO UNC% % 29  39  33   DLCO corrected ml/min/mmHg 7.85  10.62  9.24   DLCO COR %Predicted % 29  40  33   DLVA Predicted % 32  49  41     Outpatient Medications Prior to Visit  Medication Sig Dispense Refill   azaTHIOprine  (IMURAN) 50 MG tablet Take 2 tablets (100 mg total) by mouth daily. 180 tablet 0   clobetasol (TEMOVATE) 0.05 % external solution Apply 1 Application topically daily. Apply to back or non sensitive skin, avoid face. Use for up to 3 to 4 weeks. 50 mL 1   hydroxychloroquine (PLAQUENIL) 200 MG tablet Take 1 tablet by mouth daily.     ketoconazole (NIZORAL) 2 % shampoo Apply 1 Application topically 2 (two) times a week. For scalp. For up to 4 weeks, may increase to 3 times per week if needed. 120 mL 1   Multiple Vitamins-Minerals (CENTRUM SILVER 50+MEN PO) Take by mouth daily.     rizatriptan (MAXALT-MLT) 10 MG disintegrating tablet Take 1 tablet (10 mg total) by mouth as needed for migraine. May repeat in 2 hours if needed 10 tablet 0   meclizine (ANTIVERT) 25 MG tablet Take 1 tablet (25 mg total) by mouth 3 (three) times daily as needed for dizziness. (Patient not taking: Reported on 04/25/2023) 30 tablet 2   Pirfenidone 267 MG CAPS Take 534 mg by mouth 3 (three) times daily with meals. **low dose as maintenance** (Patient not taking: Reported on 03/07/2023) 180 capsule 5   predniSONE (DELTASONE) 10 MG tablet Take 6 tabs on day 1, 5 tabs on day 2, 4 tabs on day 3, 3 tabs on day 4, 2 tabs on day 5, 1 tab on day 6 (Patient not taking: Reported on 03/07/2023) 21 tablet 0   saw palmetto 160 MG capsule Take 1 capsule (160 mg total) by mouth 2 (two) times daily. (Patient not taking: Reported on 04/25/2023)     No facility-administered medications prior to visit.

## 2023-04-29 NOTE — Telephone Encounter (Signed)
Devki, would pt patient qualify for patient assistance for ofev?

## 2023-04-29 NOTE — Telephone Encounter (Signed)
Dr. Aundria Rud, please see below message.

## 2023-04-29 NOTE — Telephone Encounter (Signed)
Thank you - can you look into the financials for nintedanib please?

## 2023-05-02 DIAGNOSIS — M0579 Rheumatoid arthritis with rheumatoid factor of multiple sites without organ or systems involvement: Secondary | ICD-10-CM | POA: Diagnosis not present

## 2023-05-06 DIAGNOSIS — H2511 Age-related nuclear cataract, right eye: Secondary | ICD-10-CM | POA: Diagnosis not present

## 2023-05-06 DIAGNOSIS — H25012 Cortical age-related cataract, left eye: Secondary | ICD-10-CM | POA: Diagnosis not present

## 2023-05-06 DIAGNOSIS — H2512 Age-related nuclear cataract, left eye: Secondary | ICD-10-CM | POA: Diagnosis not present

## 2023-05-06 DIAGNOSIS — H269 Unspecified cataract: Secondary | ICD-10-CM | POA: Diagnosis not present

## 2023-05-08 ENCOUNTER — Telehealth: Payer: Self-pay | Admitting: Pharmacist

## 2023-05-08 ENCOUNTER — Other Ambulatory Visit (HOSPITAL_COMMUNITY): Payer: Self-pay

## 2023-05-08 DIAGNOSIS — M359 Systemic involvement of connective tissue, unspecified: Secondary | ICD-10-CM

## 2023-05-08 NOTE — Telephone Encounter (Signed)
ATC patient's wife regarding Ofev vs pirfenidone - specifically to see if pt qualifies for PAP for Ofev. Unable to reach. VM box is full  Called patient directly. I ran test claim. Copay is still $147.41 for 30 day supply. Patient states he can scrap up money for this copay  He will have to re-titrate with pirfenidone 267mg  tabs: Take 1 tab three times daily for 7 days, then 2 tabs three times daily thereafter. He was on low-dose as maintenance  Will send rx on Tuesday since pharmacy is changing systems next week. This will prevent issues with onboarding him into pharmacy again  [name], PharmD, MPH, BCPS, CPP Clinical Pharmacist (Rheumatology and Pulmonology)

## 2023-05-08 NOTE — Addendum Note (Signed)
Addended by: Murrell Redden on: 05/08/2023 01:06 PM   Modules accepted: Orders

## 2023-05-08 NOTE — Telephone Encounter (Signed)
Thank you very much for following up on this.  Sincerely, habib

## 2023-05-08 NOTE — Telephone Encounter (Signed)
Spoke with pt. Documented call in phone note from 05/08/23  [name], PharmD, MPH, BCPS, CPP Clinical Pharmacist (Rheumatology and Pulmonology)

## 2023-05-12 ENCOUNTER — Ambulatory Visit: Payer: Medicare HMO | Admitting: Family Medicine

## 2023-05-13 ENCOUNTER — Other Ambulatory Visit: Payer: Self-pay

## 2023-05-13 DIAGNOSIS — H2512 Age-related nuclear cataract, left eye: Secondary | ICD-10-CM | POA: Diagnosis not present

## 2023-05-13 DIAGNOSIS — H269 Unspecified cataract: Secondary | ICD-10-CM | POA: Diagnosis not present

## 2023-05-13 MED ORDER — PIRFENIDONE 267 MG PO TABS
ORAL_TABLET | ORAL | 0 refills | Status: DC
Start: 2023-05-13 — End: 2023-10-27
  Filled 2023-05-14: qty 159, 30d supply, fill #0

## 2023-05-13 MED ORDER — PIRFENIDONE 267 MG PO TABS
534.0000 mg | ORAL_TABLET | Freq: Three times a day (TID) | ORAL | 4 refills | Status: DC
Start: 2023-05-13 — End: 2023-10-27
  Filled 2023-05-14: qty 180, 30d supply, fill #0

## 2023-05-13 NOTE — Telephone Encounter (Signed)
Rx for pirfenidone sent to [facility]  [name], PharmD, MPH, BCPS, CPP Clinical Pharmacist (Rheumatology and Pulmonology)

## 2023-05-14 ENCOUNTER — Other Ambulatory Visit: Payer: Self-pay

## 2023-05-14 ENCOUNTER — Other Ambulatory Visit (HOSPITAL_COMMUNITY): Payer: Self-pay

## 2023-05-14 NOTE — Progress Notes (Signed)
Pt restarting pirfenidone. Low dose with max dose of 534 mg three times daily.  Lfts every 3 months  [name], PharmD, MPH, BCPS, CPP Clinical Pharmacist (Rheumatology and Pulmonology)

## 2023-05-15 ENCOUNTER — Other Ambulatory Visit: Payer: Self-pay

## 2023-05-20 DIAGNOSIS — H2511 Age-related nuclear cataract, right eye: Secondary | ICD-10-CM | POA: Diagnosis not present

## 2023-05-20 DIAGNOSIS — H2512 Age-related nuclear cataract, left eye: Secondary | ICD-10-CM | POA: Diagnosis not present

## 2023-06-05 DIAGNOSIS — J849 Interstitial pulmonary disease, unspecified: Secondary | ICD-10-CM | POA: Diagnosis not present

## 2023-06-05 DIAGNOSIS — M0579 Rheumatoid arthritis with rheumatoid factor of multiple sites without organ or systems involvement: Secondary | ICD-10-CM | POA: Diagnosis not present

## 2023-06-05 DIAGNOSIS — Z7960 Long term (current) use of unspecified immunomodulators and immunosuppressants: Secondary | ICD-10-CM | POA: Diagnosis not present

## 2023-06-27 ENCOUNTER — Other Ambulatory Visit (HOSPITAL_COMMUNITY): Payer: Self-pay

## 2023-06-27 ENCOUNTER — Other Ambulatory Visit: Payer: Self-pay

## 2023-06-27 NOTE — Progress Notes (Signed)
Refill task was not added during scheduling of initial fill. Contacted pt and discussed, he states that he still has half a bottle remaining but would like to go ahead and refill. Test claim revealed that copay would be $396 for 180 tablets even though his previous fill was only $121.84 for 159 tablets. Pt unwilling to move forward with filling medication.  Discussed with pt potentially enrolling into the Medicare prescription savings program for 2025, he states that he had received a letter about this and would f/u to see if it would be worth his while. Will hold off on filling medication for now.

## 2023-07-03 ENCOUNTER — Other Ambulatory Visit: Payer: Self-pay

## 2023-07-03 DIAGNOSIS — M0579 Rheumatoid arthritis with rheumatoid factor of multiple sites without organ or systems involvement: Secondary | ICD-10-CM | POA: Diagnosis not present

## 2023-07-24 ENCOUNTER — Other Ambulatory Visit: Payer: Self-pay

## 2023-07-24 DIAGNOSIS — H6123 Impacted cerumen, bilateral: Secondary | ICD-10-CM | POA: Diagnosis not present

## 2023-07-24 DIAGNOSIS — H903 Sensorineural hearing loss, bilateral: Secondary | ICD-10-CM | POA: Diagnosis not present

## 2023-07-24 DIAGNOSIS — R1314 Dysphagia, pharyngoesophageal phase: Secondary | ICD-10-CM | POA: Diagnosis not present

## 2023-07-24 DIAGNOSIS — Z85818 Personal history of malignant neoplasm of other sites of lip, oral cavity, and pharynx: Secondary | ICD-10-CM | POA: Diagnosis not present

## 2023-08-04 ENCOUNTER — Other Ambulatory Visit: Payer: Self-pay | Admitting: Otolaryngology

## 2023-08-04 DIAGNOSIS — R1310 Dysphagia, unspecified: Secondary | ICD-10-CM

## 2023-08-05 DIAGNOSIS — M0579 Rheumatoid arthritis with rheumatoid factor of multiple sites without organ or systems involvement: Secondary | ICD-10-CM | POA: Diagnosis not present

## 2023-08-15 ENCOUNTER — Encounter: Payer: Self-pay | Admitting: Family Medicine

## 2023-08-15 ENCOUNTER — Ambulatory Visit (INDEPENDENT_AMBULATORY_CARE_PROVIDER_SITE_OTHER): Payer: Medicare HMO | Admitting: Family Medicine

## 2023-08-15 VITALS — BP 114/76 | HR 55 | Ht 71.0 in | Wt 137.0 lb

## 2023-08-15 DIAGNOSIS — I700 Atherosclerosis of aorta: Secondary | ICD-10-CM | POA: Diagnosis not present

## 2023-08-15 DIAGNOSIS — N401 Enlarged prostate with lower urinary tract symptoms: Secondary | ICD-10-CM

## 2023-08-15 DIAGNOSIS — R7309 Other abnormal glucose: Secondary | ICD-10-CM | POA: Diagnosis not present

## 2023-08-15 DIAGNOSIS — J849 Interstitial pulmonary disease, unspecified: Secondary | ICD-10-CM

## 2023-08-15 DIAGNOSIS — L819 Disorder of pigmentation, unspecified: Secondary | ICD-10-CM

## 2023-08-15 DIAGNOSIS — Z0000 Encounter for general adult medical examination without abnormal findings: Secondary | ICD-10-CM | POA: Diagnosis not present

## 2023-08-15 DIAGNOSIS — J84112 Idiopathic pulmonary fibrosis: Secondary | ICD-10-CM

## 2023-08-15 DIAGNOSIS — E7800 Pure hypercholesterolemia, unspecified: Secondary | ICD-10-CM

## 2023-08-15 DIAGNOSIS — R351 Nocturia: Secondary | ICD-10-CM | POA: Diagnosis not present

## 2023-08-15 NOTE — Assessment & Plan Note (Signed)
#  Chronic ILD Followed by [facility] On  med management

## 2023-08-15 NOTE — Progress Notes (Signed)
Subjective:    Patient ID: [name], male    DOB: 1956/04/25, 67 y.o.   MRN: [id number]  Lawrence Wells is a 67 y.o. male presenting on 08/15/2023 for Annual Exam   HPI  Discussed the use of AI scribe software for clinical note transcription with the patient, who gave verbal consent to proceed.  History of Present Illness    He reports no new health problems and has been maintaining an active lifestyle. He is currently on a regimen of doxycycline, prescribed by his ophthalmologist for an unspecified eye issue, and azathioprine for his arthritis. He also receives monthly injections, the nature of which is not specified in the conversation.  The patient mentions a dark spot on his chest, which has been noticed by a family member. He has no personal or family history of skin cancer but has been exposed to large amount of sun time in his younger days. He does not report any changes in the size or appearance of the spot. - Says spot present for many years  The patient is satisfied with his current health status, reporting good appetite and weight maintenance. He has no complaints of fluid or swelling retention. He is currently insured by [facility] but plans to switch to [employer] in the new year. He has recently switched pharmacies from [facility] to [facility].      ILD / Chronic  Followed by [facility] currently w Dr Dia Sitter Now patient prefers to relocate Pulmonology over to [facility], to receive his care locally and request another opinion He has been waiting for Esbriet therapy but it was unable to be arranged, and still waiting on this  Followed by Rheumatology On Azathioprine Injections  Followed by Dr Clydene Pugh  History of Episodic Migraines No further flares Has Rizatriptan PRN  Additional updates    Health Maintenance:  Flu and Shingrix done  Prevnar 20 vaccine done 04/2023     08/15/2023    8:13 AM 04/25/2023    8:09 AM 03/07/2023    10:34 AM  Depression screen PHQ 2/9  Decreased Interest 0 0 1  Down, Depressed, Hopeless 1 1 1   PHQ - 2 Score 1 1 2   Altered sleeping 1 1 1   Tired, decreased energy 0 0 1  Change in appetite 0 0   Feeling bad or failure about yourself  1 1   Trouble concentrating 0 0   Moving slowly or fidgety/restless 0 0   Suicidal thoughts 0    PHQ-9 Score 3 3 4   Difficult doing work/chores  Not difficult at all Not difficult at all       08/15/2023    8:13 AM 04/25/2023    8:09 AM 12/26/2021    4:07 PM 10/17/2021   11:10 AM  GAD 7 : Generalized Anxiety Score  Nervous, Anxious, on Edge 0 0 0 1  Control/stop worrying 0 1 1 2   Worry too much - different things 0 1 1 2   Trouble relaxing 0 0 0 1  Restless 2 0 0 1  Easily annoyed or irritable 1 0 1 2  Afraid - awful might happen 0 0 1 1  Total GAD 7 Score 3 2 4 10   Anxiety Difficulty   Somewhat difficult Somewhat difficult     Past Medical History:  Diagnosis Date   Arthritis    Collagen vascular disease (HCC)    Rhematoid Arthritis   COPD (chronic obstructive pulmonary disease) (HCC)  Coronary artery disease    Dyspnea    Emphysema lung (HCC)    Hyperlipidemia    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HCC)    Past Surgical History:  Procedure Laterality Date   SINUS SURGERY WITH INSTATRAK     Social History   Socioeconomic History   Marital status: Married    Spouse name: Chartered certified accountant   Number of children: Not on file   Years of education: High School   Highest education level: Not on file  Occupational History   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[employer])    Comment: Retired 2012  Tobacco Use   Smoking status: Former    Current packs/day: 0.00    Average packs/day: 1.5 packs/day for 30.0 years (45.0 ttl pk-yrs)    Types: Cigarettes    Start date: 95    Quit date: 2007    Years since quitting: 18.0   Smokeless tobacco: Former    Types: Chew    Quit date: 2007   Tobacco comments:    Dip smokeless tobacco >20-30 years  Vaping Use    Vaping status: Never Used  Substance and Sexual Activity   Alcohol use: Not Currently    Comment: occ   Drug use: No   Sexual activity: Not Currently  Other Topics Concern   Not on file  Social History Narrative   Right handed   One story home   Drinks caffeine   Social Drivers of Health   Financial Resource Strain: Low Risk  (03/07/2023)   Overall Financial Resource Strain (CARDIA)    Difficulty of Paying Living Expenses: Not hard at all  Food Insecurity: No Food Insecurity (03/07/2023)   Hunger Vital Sign    Worried About Running Out of Food in the Last Year: Never true    Ran Out of Food in the Last Year: Never true  Transportation Needs: No Transportation Needs (03/07/2023)   PRAPARE - Administrator, Civil Service (Medical): No    Lack of Transportation (Non-Medical): No  Physical Activity: Sufficiently Active (03/07/2023)   Exercise Vital Sign    Days of Exercise per Week: 3 days    Minutes of Exercise per Session: 60 min  Stress: No Stress Concern Present (03/07/2023)   [employer] of Occupational Health - Occupational Stress Questionnaire    Feeling of Stress : Only a little  Social Connections: Moderately Isolated (03/07/2023)   Social Connection and Isolation Panel [NHANES]    Frequency of Communication with Friends and Family: More than three times a week    Frequency of Social Gatherings with Friends and Family: Once a week    Attends Religious Services: Never    Database administrator or Organizations: No    Attends Banker Meetings: Never    Marital Status: Married  Catering manager Violence: Not At Risk (03/07/2023)   Humiliation, Afraid, Rape, and Kick questionnaire    Fear of Current or Ex-Partner: No    Emotionally Abused: No    Physically Abused: No    Sexually Abused: No   Family History  Problem Relation Age of Onset   Heart disease Mother    Heart attack Mother 36   Arthritis Mother    Heart disease Father    Heart  attack Father 9   Healthy Sister    Hyperlipidemia Brother    Throat cancer Brother 60   Heart disease Brother    Healthy Son    Healthy Daughter    Current Outpatient Medications  on File Prior to Visit  Medication Sig   azaTHIOprine (IMURAN) 50 MG tablet Take 2 tablets (100 mg total) by mouth daily.   BESIVANCE 0.6 % SUSP Place 1 drop into the left eye 3 (three) times daily.   Difluprednate 0.05 % EMUL    doxycycline (VIBRAMYCIN) 50 MG capsule Take 50 mg by mouth daily.   hydroxychloroquine (PLAQUENIL) 200 MG tablet Take 1 tablet by mouth daily.   Multiple Vitamins-Minerals (CENTRUM SILVER 50+MEN PO) Take by mouth daily.   Pirfenidone 267 MG TABS Take 1 tab three times daily for 7 days, then 2 tabs three times daily thereafter   Pirfenidone 267 MG TABS Take 2 tablets (534 mg total) by mouth 3 (three) times daily with meals.   PROLENSA 0.07 % SOLN Place 1 drop into the left eye 2 (two) times daily.   clobetasol (TEMOVATE) 0.05 % external solution Apply 1 Application topically daily. Apply to back or non sensitive skin, avoid face. Use for up to 3 to 4 weeks. (Patient not taking: Reported on 08/15/2023)   ketoconazole (NIZORAL) 2 % shampoo Apply 1 Application topically 2 (two) times a week. For scalp. For up to 4 weeks, may increase to 3 times per week if needed. (Patient not taking: Reported on 08/15/2023)   meclizine (ANTIVERT) 25 MG tablet Take 1 tablet (25 mg total) by mouth 3 (three) times daily as needed for dizziness. (Patient not taking: Reported on 08/15/2023)   rizatriptan (MAXALT-MLT) 10 MG disintegrating tablet Take 1 tablet (10 mg total) by mouth as needed for migraine. May repeat in 2 hours if needed (Patient not taking: Reported on 08/15/2023)   No current facility-administered medications on file prior to visit.    Review of Systems  Constitutional:  Negative for activity change, appetite change, chills, diaphoresis, fatigue and fever.  HENT:  Negative for congestion and  hearing loss.   Eyes:  Negative for visual disturbance.  Respiratory:  Negative for cough, chest tightness, shortness of breath and wheezing.   Cardiovascular:  Negative for chest pain, palpitations and leg swelling.  Gastrointestinal:  Negative for abdominal pain, constipation, diarrhea, nausea and vomiting.  Genitourinary:  Negative for dysuria, frequency and hematuria.  Musculoskeletal:  Negative for arthralgias and neck pain.  Skin:  Negative for rash.       Skin lesion, pigmented  Neurological:  Negative for dizziness, weakness, light-headedness, numbness and headaches.  Hematological:  Negative for adenopathy.  Psychiatric/Behavioral:  Negative for behavioral problems, dysphoric mood and sleep disturbance.    Per HPI unless specifically indicated above     Objective:    BP 114/76   Pulse (!) 55   Ht 5\' 11"  (1.803 m)   Wt 137 lb (62.1 kg)   SpO2 97%   BMI 19.11 kg/m   Wt Readings from Last 3 Encounters:  08/15/23 137 lb (62.1 kg)  04/28/23 135 lb (61.2 kg)  04/25/23 134 lb 6.4 oz (61 kg)    Physical Exam Vitals and nursing note reviewed.  Constitutional:      General: He is not in acute distress.    Appearance: He is well-developed. He is not diaphoretic.     Comments: Well-appearing, comfortable, cooperative  HENT:     Head: Normocephalic and atraumatic.  Eyes:     General:        Right eye: No discharge.        Left eye: No discharge.     Conjunctiva/sclera: Conjunctivae normal.     Pupils: Pupils are  equal, round, and reactive to light.  Neck:     Thyroid: No thyromegaly.     Vascular: No carotid bruit.  Cardiovascular:     Rate and Rhythm: Normal rate and regular rhythm.     Pulses: Normal pulses.     Heart sounds: Normal heart sounds. No murmur heard. Pulmonary:     Effort: Pulmonary effort is normal. No respiratory distress.     Breath sounds: Normal breath sounds. No wheezing or rales.  Abdominal:     General: Bowel sounds are normal. There is no  distension.     Palpations: Abdomen is soft. There is no mass.     Tenderness: There is no abdominal tenderness.  Musculoskeletal:        General: No tenderness. Normal range of motion.     Cervical back: Normal range of motion and neck supple.     Right lower leg: No edema.     Left lower leg: No edema.     Comments: Upper / Lower Extremities: - Normal muscle tone, strength bilateral upper extremities 5/5, lower extremities 5/5  Lymphadenopathy:     Cervical: No cervical adenopathy.  Skin:    General: Skin is warm and dry.     Findings: Lesion (R anterior chest, pigmented dark skin lesion some raised, atypical irregular border) present. No erythema or rash.  Neurological:     Mental Status: He is alert and oriented to person, place, and time.     Comments: Distal sensation intact to light touch all extremities  Psychiatric:        Mood and Affect: Mood normal.        Behavior: Behavior normal.        Thought Content: Thought content normal.     Comments: Well groomed, good eye contact, normal speech and thoughts     Right Chest wall pigmented skin lesion   Results for orders placed or performed in visit on 02/17/23  POC COVID-19   Collection Time: 02/17/23  3:10 PM  Result Value Ref Range   SARS Coronavirus 2 Ag Negative Negative      Assessment & Plan:   Problem List Items Addressed This Visit     Aortic atherosclerosis (HCC)   Identified on CT 01/2020 No acute complications No longer on cholesterol therapy      Relevant Orders   Lipid panel   Hemoglobin A1c   COMPLETE METABOLIC PANEL WITH GFR   Hypercholesterolemia   Relevant Orders   Lipid panel   COMPLETE METABOLIC PANEL WITH GFR   TSH   ILD (interstitial lung disease) (HCC)   #Chronic ILD Followed by [facility] On  med management      Relevant Orders   CBC with Differential/Platelet   IPF (idiopathic pulmonary fibrosis) (HCC)   Relevant Orders   CBC with Differential/Platelet   Other  Visit Diagnoses       Annual physical exam    -  Primary   Relevant Orders   Lipid panel   Hemoglobin A1c   CBC with Differential/Platelet   COMPLETE METABOLIC PANEL WITH GFR   TSH     BPH associated with nocturia       Relevant Orders   PSA     Atypical pigmented skin lesion       Relevant Orders   Ambulatory referral to Dermatology        Updated Health Maintenance information Fasting lab orders today Encouraged improvement to lifestyle with diet and exercise Goal of  weight loss   Skin Lesion Dark pigmented lesion with atypical borders on the chest. No history of skin cancer. -Refer to dermatology for evaluation and possible biopsy.  Annual Check-up Patient is generally doing well with no new complaints. -Order basic blood panel including chemistry, cholesterol, blood count, sugar, and thyroid levels. -Consider vitamin testing if patient reports symptoms of deficiency in the future.  Colon Cancer Screening Patient has not had a colonoscopy due to lung issues. Patient is not interested in home testing. -Discussed the importance of colon cancer screening and offered Cologuard as an option. Patient to consider.  Medication Review Patient is on doxycycline (dose unknown) for eye issues and azathioprine for dermatological issues. -Continue current medications.  Follow-up Patient is stable with no new complaints. -Schedule follow-up appointment in 6 months.       Orders Placed This Encounter  Procedures   Lipid panel    Has the patient fasted?:   Yes   Hemoglobin A1c   CBC with Differential/Platelet   COMPLETE METABOLIC PANEL WITH GFR   PSA   TSH   Ambulatory referral to Dermatology    Referral Priority:   Routine    Referral Type:   Consultation    Referral Reason:   Specialty Services Required    Requested Specialty:   Dermatology    Number of Visits Requested:   1    No orders of the defined types were placed in this encounter.    Follow up  plan: Return in about 6 months (around 02/13/2024) for 6 month follow-up ILD Pulm, updates.  Saralyn Pilar, DO [facility] 08/15/2023, 8:25 AM

## 2023-08-15 NOTE — Assessment & Plan Note (Signed)
Identified on CT 01/2020 No acute complications No longer on cholesterol therapy

## 2023-08-15 NOTE — Patient Instructions (Addendum)
Thank you for coming to the office today.  Labs today  Referral today to Dermatologist, one of these locations. The skin spot does have some darkening and atypical border and should be evaluated by Dermatologist.  [facility]   [address] [place], [place] [address] Hours: 8AM-5PM Phone: [phone]  [facility] [address] [place], [place] [address] Phone: [phone]   Colon Cancer Screening: - For all adults age 75+ routine colon cancer screening is highly recommended.     - Recent guidelines from American Cancer Society recommend starting age of 40 - Early detection of colon cancer is important, because often there are no warning signs or symptoms, also if found early usually it can be cured. Late stage is hard to treat.  - If you are not interested in Colonoscopy screening (if done and normal you could be cleared for 5 to 10 years until next due), then Cologuard is an excellent alternative for screening test for Colon Cancer. It is highly sensitive for detecting DNA of colon cancer from even the earliest stages. Also, there is NO bowel prep required. - If Cologuard is NEGATIVE, then it is good for 3 years before next due - If Cologuard is POSITIVE, then it is strongly advised to get a Colonoscopy, which allows the GI doctor to locate the source of the cancer or polyp (even very early stage) and treat it by removing it. ------------------------- If you would like to proceed with Cologuard (stool DNA test) - FIRST, call your insurance company and tell them you want to check cost of Cologuard tell them CPT Code 44010 (it may be completely covered and you could get for no cost, OR max cost without any coverage is about $600). Also, keep in mind if you do NOT open the kit, and decide not to do the test, you will NOT be charged, you should contact the company if you decide not to do the test. - If you want to proceed, you can notify us (phone message, MyChart  Message, or at next visit) and we will order it for you. The test kit will be delivered to you house within about 1 week. Follow instructions to collect sample, you may call the company for any help or questions, 24/7 telephone support at [phone].    Please schedule a Follow-up Appointment to: Return in about 6 months (around 02/13/2024) for 6 month follow-up ILD Pulm, updates.  If you have any other questions or concerns, please feel free to call the office or send a message through MyChart. You may also schedule an earlier appointment if necessary.  Additionally, you may be receiving a survey about your experience at our office within a few days to 1 week by e-mail or mail. We value your feedback.  Saralyn Pilar, DO [facility] [place]

## 2023-08-16 LAB — LIPID PANEL
Cholesterol: 254 mg/dL — ABNORMAL HIGH (ref <200)
HDL: 94 mg/dL (ref >=40)
LDL Cholesterol (Calc): 135 mg/dL — ABNORMAL HIGH
Non-HDL Cholesterol (Calc): 160 mg/dL — ABNORMAL HIGH (ref <130)
Total CHOL/HDL Ratio: 2.7 (calc) (ref <5.0)
Triglycerides: 124 mg/dL (ref <150)

## 2023-08-16 LAB — CBC WITH DIFFERENTIAL/PLATELET
Absolute Lymphocytes: 1469 {cells}/uL (ref 850–3900)
Absolute Monocytes: 502 {cells}/uL (ref 200–950)
Basophils Absolute: 31 {cells}/uL (ref 0–200)
Basophils Relative: 0.5 %
Eosinophils Absolute: 81 {cells}/uL (ref 15–500)
Eosinophils Relative: 1.3 %
HCT: 46.2 % (ref 38.5–50.0)
Hemoglobin: 15.8 g/dL (ref 13.2–17.1)
MCH: 33.4 pg — ABNORMAL HIGH (ref 27.0–33.0)
MCHC: 34.2 g/dL (ref 32.0–36.0)
MCV: 97.7 fL (ref 80.0–100.0)
MPV: 9.8 fL (ref 7.5–12.5)
Monocytes Relative: 8.1 %
Neutro Abs: 4117 {cells}/uL (ref 1500–7800)
Neutrophils Relative %: 66.4 %
Platelets: 277 10*3/uL (ref 140–400)
RBC: 4.73 10*6/uL (ref 4.20–5.80)
RDW: 12.8 % (ref 11.0–15.0)
Total Lymphocyte: 23.7 %
WBC: 6.2 10*3/uL (ref 3.8–10.8)

## 2023-08-16 LAB — HEMOGLOBIN A1C
Hgb A1c MFr Bld: 5.7 %{Hb} — ABNORMAL HIGH (ref <5.7)
Mean Plasma Glucose: 117 mg/dL
eAG (mmol/L): 6.5 mmol/L

## 2023-08-16 LAB — COMPLETE METABOLIC PANEL WITH GFR
AG Ratio: 2 (calc) (ref 1.0–2.5)
ALT: 14 U/L (ref 9–46)
AST: 24 U/L (ref 10–35)
Albumin: 4.8 g/dL (ref 3.6–5.1)
Alkaline phosphatase (APISO): 61 U/L (ref 35–144)
BUN: 13 mg/dL (ref 7–25)
CO2: 30 mmol/L (ref 20–32)
Calcium: 9.6 mg/dL (ref 8.6–10.3)
Chloride: 101 mmol/L (ref 98–110)
Creat: 0.98 mg/dL (ref 0.70–1.35)
Globulin: 2.4 g/dL (ref 1.9–3.7)
Glucose, Bld: 100 mg/dL — ABNORMAL HIGH (ref 65–99)
Potassium: 4.7 mmol/L (ref 3.5–5.3)
Sodium: 140 mmol/L (ref 135–146)
Total Bilirubin: 0.6 mg/dL (ref 0.2–1.2)
Total Protein: 7.2 g/dL (ref 6.1–8.1)
eGFR: 83 mL/min/{1.73_m2} (ref >=60)

## 2023-08-16 LAB — PSA: PSA: 0.43 ng/mL (ref <=4.00)

## 2023-08-16 LAB — TSH: TSH: 3.51 m[IU]/L (ref 0.40–4.50)

## 2023-08-25 ENCOUNTER — Ambulatory Visit
Admission: RE | Admit: 2023-08-25 | Discharge: 2023-08-25 | Disposition: A | Discharge status: Home / Self Care | Payer: Medicare Other | Source: Ambulatory Visit | Attending: Otolaryngology | Admitting: Otolaryngology

## 2023-08-25 DIAGNOSIS — R1310 Dysphagia, unspecified: Secondary | ICD-10-CM | POA: Diagnosis not present

## 2023-08-25 NOTE — Therapy (Signed)
 Modified Barium Swallow Study  Patient Details  Name: Lawrence Wells MRN: [id number] Date of Birth: 08-15-1956  Today's Date: 08/25/2023  Modified Barium Swallow completed.  Full report located under Chart Review in the Imaging Section.  History of Present Illness Pt is a 68 y.o. 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 Pt had PEG 2022. Pt had course of ST in 2023 targeting pharyngeal strengthening. MBS 04/19/22  Pt with reduced anterior laryngeal movements as observed in patients who are s/p head and neck radiation. This results in decreased epiglottic deflection, reduced airway closure and residue within the vallecula and pyriform sinuses. When consuming thin liquids, pt presents with intermittent trace penetration. All penetrates clear with subsequent swallows. When consuming puree and graham with puree, pt has moderate vallecular residue that he is sensate to which he then hocks up into the oral cavity and re-swallows. No diet modifications recommended at this time, but do recommend Outpatient ST services to target increased movement of the larynx and hopeful increased PO consumption.   Clinical Impression Pt seen for MBSS. Findings from today's MBSS appear largely c/w previous MBSS in September 2023. 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 Pt with mildly prolonged mastication with may be due to dental status, but pt reports being habitual given fear of choking. Pt with reduced base of tongue retraction, reduced hyolaryngeal elevation/excursion, reduced duration/amplitude of UES opening, and reduced pharyngeal stripping wave. These physiological impairments contribute to moderate pharyngeal stasis appreciated most with solids and mainly in the vallecula. Residual is reduced greatly with liquid wash x1-2 sips. Stasis correlated clinically with pt's  report of globus sensation. Recommend continuation of current diet with well-masticated/moistened solids, standard aspiration precautions, and use of liquid wash 1-2x after each bite. Pt well-versed  in dysphagia ex's from course of ST in 2023 and encouraged to complete ex's to help prevent further decline in swallow function. Factors that may increase risk of adverse event in presence of aspiration Noe & Lianne 2021):  n/a  Swallow Evaluation Recommendations Recommendations: PO diet PO Diet Recommendation: Regular;Thin liquids (Level 0) Liquid Administration via: Spoon;Cup;Straw Medication Administration:  (as tolerated) Supervision: Patient able to self-feed Swallowing strategies  : Minimize environmental distractions;Slow rate;Small bites/sips;Follow solids with liquids Postural changes: Position pt fully upright for meals;Stay upright 30-60 min after meals Oral care recommendations: Oral care BID (2x/day)    Delon Bangs, M.S., CCC-SLP Speech-Language Pathologist [facility] [phone] (ASCOM)   Delon HERO Lahna Nath 08/25/2023,1:48 PM

## 2023-09-04 ENCOUNTER — Other Ambulatory Visit: Payer: Self-pay

## 2023-09-09 DIAGNOSIS — K080 Exfoliation of teeth due to systemic causes: Secondary | ICD-10-CM | POA: Diagnosis not present

## 2023-09-18 ENCOUNTER — Ambulatory Visit: Payer: Self-pay | Admitting: *Deleted

## 2023-09-18 DIAGNOSIS — Z20828 Contact with and (suspected) exposure to other viral communicable diseases: Secondary | ICD-10-CM

## 2023-09-18 MED ORDER — OSELTAMIVIR PHOSPHATE 75 MG PO CAPS
75.0000 mg | ORAL_CAPSULE | Freq: Every day | ORAL | 0 refills | Status: DC
Start: 2023-09-18 — End: 2024-01-27

## 2023-09-18 NOTE — Telephone Encounter (Signed)
Reason for Disposition  [1] Influenza EXPOSURE (Close Contact) within last 48 hours (2 days) AND [2] exposed person is HIGH RISK (e.g., age > 64 years, pregnant, HIV+, chronic medical condition)  Answer Assessment - Initial Assessment Questions 1. TYPE of EXPOSURE: "How were you exposed?" (e.g., close contact, not a close contact)     Home- granddaughter spent the night last night and woke not feeling well- diagnosed with flu today 2. DATE of EXPOSURE: "When did the exposure occur?" (e.g., hour, days, weeks)     09/17/23  4. HIGH RISK for COMPLICATIONS: "Do you have any heart or lung problems?" "Do you have a weakened immune system?" (e.g., CHF, COPD, asthma, HIV positive, chemotherapy, renal failure, diabetes mellitus, sickle cell anemia)     Respiratory and cardiac disease- high risk 5. SYMPTOMS: "Do you have any symptoms?" (e.g., cough, fever, sore throat, difficulty breathing).     No symptoms  Protocols used: Influenza Exposure-A-AH  [facility]- has recently changed pharmacy

## 2023-09-18 NOTE — Addendum Note (Signed)
Addended by: Smitty Cords on: 09/18/2023 04:57 PM   Modules accepted: Orders

## 2023-09-18 NOTE — Telephone Encounter (Signed)
Called patient. Spoke with wife. They are both asymptomatic. I sent rx Tamiflu 75mg  daily for 10 day for exposure, switch to TWICE A DAY dosing for up to 5 day or finish course if symptoms develop  Saralyn Pilar, DO [facility] 09/18/2023, 4:56 PM

## 2023-09-18 NOTE — Telephone Encounter (Signed)
  Chief Complaint: Flu exposure-requesting Tamiflu Symptoms: no symptoms at this time Frequency: exposure: 09/17/23 pm  Disposition: [] ED /[] Urgent Care (no appt availability in office) / [] Appointment(In office/virtual)/ []  [place] [facility]/ [] Home Care/ [] Refused Recommended Disposition /[] [facility]/ [x]  Follow-up with PCP Additional Notes: patient's granddaughter spent the night with patient in home last night-woke today feling sick- diagnosed today with flu. Patient and spouse are requesting Tamiflu. Patient has changed pharmacy- [facility]

## 2023-09-22 DIAGNOSIS — M0579 Rheumatoid arthritis with rheumatoid factor of multiple sites without organ or systems involvement: Secondary | ICD-10-CM | POA: Diagnosis not present

## 2023-09-23 DIAGNOSIS — L718 Other rosacea: Secondary | ICD-10-CM | POA: Diagnosis not present

## 2023-09-23 DIAGNOSIS — H0288A Meibomian gland dysfunction right eye, upper and lower eyelids: Secondary | ICD-10-CM | POA: Diagnosis not present

## 2023-09-23 DIAGNOSIS — H0288B Meibomian gland dysfunction left eye, upper and lower eyelids: Secondary | ICD-10-CM | POA: Diagnosis not present

## 2023-10-06 DIAGNOSIS — Z7960 Long term (current) use of unspecified immunomodulators and immunosuppressants: Secondary | ICD-10-CM | POA: Diagnosis not present

## 2023-10-06 DIAGNOSIS — M0579 Rheumatoid arthritis with rheumatoid factor of multiple sites without organ or systems involvement: Secondary | ICD-10-CM | POA: Diagnosis not present

## 2023-10-06 DIAGNOSIS — J849 Interstitial pulmonary disease, unspecified: Secondary | ICD-10-CM | POA: Diagnosis not present

## 2023-10-16 ENCOUNTER — Other Ambulatory Visit: Payer: Self-pay

## 2023-10-16 DIAGNOSIS — K080 Exfoliation of teeth due to systemic causes: Secondary | ICD-10-CM | POA: Diagnosis not present

## 2023-10-20 DIAGNOSIS — M0579 Rheumatoid arthritis with rheumatoid factor of multiple sites without organ or systems involvement: Secondary | ICD-10-CM | POA: Diagnosis not present

## 2023-10-24 ENCOUNTER — Other Ambulatory Visit (HOSPITAL_COMMUNITY): Payer: Self-pay

## 2023-10-24 ENCOUNTER — Telehealth: Payer: Self-pay

## 2023-10-24 DIAGNOSIS — J8489 Other specified interstitial pulmonary diseases: Secondary | ICD-10-CM

## 2023-10-24 DIAGNOSIS — M359 Systemic involvement of connective tissue, unspecified: Secondary | ICD-10-CM

## 2023-10-24 NOTE — Telephone Encounter (Signed)
 Received notification from [facility] that pt never refilled medication due to higher copay than previously paid. At that time pt stated he would look into enrolling into the MPPP.  It appears pt is no longer covered under his [employer] plan and now has a BlueMedicare plan, however he did not enroll into the MPPP. His copay is currently $447.  Since patient is no longer covered by [facility], there may no longer be restriction conflicts between his primary ([employer]BS) and secondary ([facility]), which means that the pt could potentially use both as a COB if filling through [facility].  Attempted to submit a Prior Authorization request to [facility] for PIRFENIDONE via CoverMyMeds. Request was cancelled stating that no PA is currently required.  Key: [id number]    Submitted an URGENT Prior Authorization request to [facility] for PIRFENIDONE via CoverMyMeds. Will update once we receive a response.  Key: [id number]

## 2023-10-27 MED ORDER — PIRFENIDONE 267 MG PO TABS: 534.0000 mg | ORAL_TABLET | Freq: Three times a day (TID) | ORAL | 1 refills | Status: DC

## 2023-10-27 NOTE — Telephone Encounter (Addendum)
 Received notification from [facility] regarding a prior authorization for PIRFENIDONE. Authorization has been APPROVED from 10/24/2023 to 10/23/2024. Approval letter sent to scan center.  Patient must fill [facility]: [phone]  Authorization # [id number]  Rx for pirfenidone maintenance dose sent to [facility] today. Patient may be able to bill BCBS Medicare as primary and State Health Plan as secondary (?)  [name], PharmD, MPH, BCPS, CPP Clinical Pharmacist (Rheumatology and Pulmonology)

## 2023-11-05 DIAGNOSIS — H00025 Hordeolum internum left lower eyelid: Secondary | ICD-10-CM | POA: Diagnosis not present

## 2023-11-05 DIAGNOSIS — L718 Other rosacea: Secondary | ICD-10-CM | POA: Diagnosis not present

## 2023-11-06 ENCOUNTER — Other Ambulatory Visit (HOSPITAL_COMMUNITY): Payer: Self-pay

## 2023-11-06 NOTE — Progress Notes (Signed)
 Insurance is requiring patient to fill Esbriet through [employer], disenrolling from [employer].

## 2023-11-10 NOTE — Telephone Encounter (Signed)
 Patient has received pirfenidone from [facility]. Nothing further should be needed at this time,  [name], PharmD, MPH, BCPS, CPP Clinical Pharmacist (Rheumatology and Pulmonology)

## 2023-11-10 NOTE — Telephone Encounter (Signed)
 Contacted pt's wife for update, she confirms that my suspicions were correct and medication was able to be successfully billed through both insurances with a $0 copay. She states that he has already received the medication. Nothing further is needed at this time.

## 2023-11-17 DIAGNOSIS — M0579 Rheumatoid arthritis with rheumatoid factor of multiple sites without organ or systems involvement: Secondary | ICD-10-CM | POA: Diagnosis not present

## 2023-11-25 DIAGNOSIS — L718 Other rosacea: Secondary | ICD-10-CM | POA: Diagnosis not present

## 2023-11-25 DIAGNOSIS — H0015 Chalazion left lower eyelid: Secondary | ICD-10-CM | POA: Diagnosis not present

## 2023-12-15 DIAGNOSIS — M0579 Rheumatoid arthritis with rheumatoid factor of multiple sites without organ or systems involvement: Secondary | ICD-10-CM | POA: Diagnosis not present

## 2024-01-13 DIAGNOSIS — M0579 Rheumatoid arthritis with rheumatoid factor of multiple sites without organ or systems involvement: Secondary | ICD-10-CM | POA: Diagnosis not present

## 2024-01-22 ENCOUNTER — Other Ambulatory Visit: Payer: Self-pay | Admitting: Otolaryngology

## 2024-01-22 DIAGNOSIS — Z85818 Personal history of malignant neoplasm of other sites of lip, oral cavity, and pharynx: Secondary | ICD-10-CM | POA: Diagnosis not present

## 2024-01-22 DIAGNOSIS — D102 Benign neoplasm of floor of mouth: Secondary | ICD-10-CM | POA: Diagnosis not present

## 2024-01-22 DIAGNOSIS — R1314 Dysphagia, pharyngoesophageal phase: Secondary | ICD-10-CM | POA: Diagnosis not present

## 2024-01-22 DIAGNOSIS — H903 Sensorineural hearing loss, bilateral: Secondary | ICD-10-CM | POA: Diagnosis not present

## 2024-01-23 ENCOUNTER — Encounter: Payer: Self-pay | Admitting: Otolaryngology

## 2024-01-23 NOTE — Anesthesia Preprocedure Evaluation (Addendum)
 Anesthesia Evaluation    Airway Mallampati: III   Neck ROM: Full    Dental  (+) Missing   Pulmonary COPD, former smoker (quit 2007) ILD/Pulmonary fibrosis, chronic shortness of breath   History of right tonsillar squamous cell carcinoma, chemoradiation    Pulmonary exam normal breath sounds clear to auscultation       Cardiovascular + CAD  Normal cardiovascular exam Rhythm:Regular Rate:Normal  Echo 04/24/22:   1. Left ventricular ejection fraction, by estimation, is 55 to 60%. The left ventricle has normal function. The left ventricle has no regional wall motion abnormalities. Left ventricular diastolic parameters are consistent with Grade I diastolic dysfunction (impaired relaxation).   2. Right ventricular systolic function is low normal. The right ventricular size is normal.   3. The mitral valve is normal in structure. Mild mitral valve regurgitation.   4. The aortic valve is tricuspid. Aortic valve regurgitation is not visualized. Aortic valve sclerosis/calcification is present, without any evidence of aortic stenosis.   5. The inferior vena cava is normal in size with greater than 50% respiratory variability, suggesting right atrial pressure of 3 mmHg.      Neuro/Psych HOH    GI/Hepatic   Endo/Other    Renal/GU      Musculoskeletal  (+) Arthritis , Osteoarthritis and Rheumatoid disorders,    Abdominal   Peds  Hematology   Anesthesia Other Findings   Reproductive/Obstetrics                              Anesthesia Physical Anesthesia Plan  ASA: 2  Anesthesia Plan: General   Post-op Pain Management:    Induction: Intravenous  PONV Risk Score and Plan: 2 and Ondansetron , Dexamethasone  and Treatment may vary due to age or medical condition  Airway Management Planned: Oral ETT and Nasal ETT  Additional Equipment:   Intra-op Plan:   Post-operative Plan: Extubation in  OR  Informed Consent: I have reviewed the patients History and Physical, chart, labs and discussed the procedure including the risks, benefits and alternatives for the proposed anesthesia with the patient or authorized representative who has indicated his/her understanding and acceptance.     Dental advisory given  Plan Discussed with: CRNA  Anesthesia Plan Comments: (Patient consented for risks of anesthesia including but not limited to:  - adverse reactions to medications - damage to eyes, teeth, lips or other oral mucosa - nerve damage due to positioning  - sore throat or hoarseness - damage to heart, brain, nerves, lungs, other parts of body or loss of life  Informed patient about role of CRNA in peri- and intra-operative care.  Patient voiced understanding.)         Anesthesia Quick Evaluation

## 2024-01-27 ENCOUNTER — Ambulatory Visit
Admission: RE | Admit: 2024-01-27 | Discharge: 2024-01-27 | Disposition: A | Discharge status: Home / Self Care | Attending: Otolaryngology | Admitting: Otolaryngology

## 2024-01-27 ENCOUNTER — Encounter
Admission: RE | Disposition: A | Discharge status: Home / Self Care | Payer: Self-pay | Source: Home / Self Care | Attending: Otolaryngology

## 2024-01-27 ENCOUNTER — Encounter: Payer: Self-pay | Admitting: Otolaryngology

## 2024-01-27 ENCOUNTER — Other Ambulatory Visit: Payer: Self-pay

## 2024-01-27 ENCOUNTER — Ambulatory Visit: Payer: Self-pay | Admitting: Anesthesiology

## 2024-01-27 DIAGNOSIS — Z9221 Personal history of antineoplastic chemotherapy: Secondary | ICD-10-CM | POA: Diagnosis not present

## 2024-01-27 DIAGNOSIS — D101 Benign neoplasm of tongue: Secondary | ICD-10-CM | POA: Insufficient documentation

## 2024-01-27 DIAGNOSIS — J439 Emphysema, unspecified: Secondary | ICD-10-CM | POA: Insufficient documentation

## 2024-01-27 DIAGNOSIS — J449 Chronic obstructive pulmonary disease, unspecified: Secondary | ICD-10-CM | POA: Diagnosis not present

## 2024-01-27 DIAGNOSIS — I2510 Atherosclerotic heart disease of native coronary artery without angina pectoris: Secondary | ICD-10-CM | POA: Diagnosis not present

## 2024-01-27 DIAGNOSIS — R0602 Shortness of breath: Secondary | ICD-10-CM | POA: Diagnosis not present

## 2024-01-27 DIAGNOSIS — Z85818 Personal history of malignant neoplasm of other sites of lip, oral cavity, and pharynx: Secondary | ICD-10-CM | POA: Diagnosis not present

## 2024-01-27 DIAGNOSIS — M1990 Unspecified osteoarthritis, unspecified site: Secondary | ICD-10-CM | POA: Diagnosis not present

## 2024-01-27 DIAGNOSIS — J8410 Pulmonary fibrosis, unspecified: Secondary | ICD-10-CM | POA: Insufficient documentation

## 2024-01-27 DIAGNOSIS — Z923 Personal history of irradiation: Secondary | ICD-10-CM | POA: Diagnosis not present

## 2024-01-27 DIAGNOSIS — I340 Nonrheumatic mitral (valve) insufficiency: Secondary | ICD-10-CM | POA: Diagnosis not present

## 2024-01-27 DIAGNOSIS — Z01818 Encounter for other preprocedural examination: Secondary | ICD-10-CM

## 2024-01-27 DIAGNOSIS — Z87891 Personal history of nicotine dependence: Secondary | ICD-10-CM | POA: Diagnosis not present

## 2024-01-27 DIAGNOSIS — K148 Other diseases of tongue: Secondary | ICD-10-CM | POA: Diagnosis not present

## 2024-01-27 DIAGNOSIS — M069 Rheumatoid arthritis, unspecified: Secondary | ICD-10-CM | POA: Insufficient documentation

## 2024-01-27 HISTORY — DX: Centrilobular emphysema: J43.2

## 2024-01-27 HISTORY — DX: Nonrheumatic mitral (valve) insufficiency: I34.0

## 2024-01-27 HISTORY — DX: Sensorineural hearing loss, bilateral: H90.3

## 2024-01-27 HISTORY — DX: Atherosclerotic heart disease of native coronary artery without angina pectoris: I25.10

## 2024-01-27 HISTORY — DX: Idiopathic pulmonary fibrosis: J84.112

## 2024-01-27 HISTORY — DX: Unspecified severe protein-calorie malnutrition: E43

## 2024-01-27 HISTORY — DX: Dysphagia, unspecified: R13.10

## 2024-01-27 HISTORY — DX: Rheumatoid arthritis with rheumatoid factor of multiple sites without organ or systems involvement: M05.79

## 2024-01-27 HISTORY — DX: Interstitial pulmonary disease, unspecified: J84.9

## 2024-01-27 HISTORY — PX: EXCISION OF TONGUE LESION: SHX6434

## 2024-01-27 HISTORY — DX: Seborrheic dermatitis, unspecified: L21.9

## 2024-01-27 HISTORY — DX: Presence of external hearing-aid: Z97.4

## 2024-01-27 HISTORY — DX: Other ill-defined heart diseases: I51.89

## 2024-01-27 SURGERY — EXCISION, LESION, TONGUE
Anesthesia: General | Site: Mouth | Laterality: Bilateral

## 2024-01-27 MED ORDER — FENTANYL CITRATE (PF) 100 MCG/2ML IJ SOLN: 25.0000 ug | INTRAMUSCULAR | Status: DC | PRN

## 2024-01-27 MED ORDER — PROPOFOL 10 MG/ML IV BOLUS
INTRAVENOUS | Status: DC | PRN
  Administered 2024-01-27: 100 mg via INTRAVENOUS

## 2024-01-27 MED ORDER — LIDOCAINE-EPINEPHRINE 1 %-1:100000 IJ SOLN
INTRAMUSCULAR | Status: DC | PRN
  Administered 2024-01-27: 1 mL

## 2024-01-27 MED ORDER — MIDAZOLAM HCL 2 MG/2ML IJ SOLN
INTRAMUSCULAR | Status: DC | PRN
  Administered 2024-01-27: 2 mg via INTRAVENOUS

## 2024-01-27 MED ORDER — LIDOCAINE HCL (CARDIAC) PF 100 MG/5ML IV SOSY
PREFILLED_SYRINGE | INTRAVENOUS | Status: DC | PRN
  Administered 2024-01-27: 100 mg via INTRAVENOUS

## 2024-01-27 MED ORDER — ROCURONIUM BROMIDE 100 MG/10ML IV SOLN
INTRAVENOUS | Status: DC | PRN
  Administered 2024-01-27: 40 mg via INTRAVENOUS

## 2024-01-27 MED ORDER — DEXAMETHASONE SODIUM PHOSPHATE 10 MG/ML IJ SOLN
INTRAMUSCULAR | Status: DC | PRN
  Administered 2024-01-27: 10 mg via INTRAVENOUS

## 2024-01-27 MED ORDER — ONDANSETRON HCL 4 MG/2ML IJ SOLN
INTRAMUSCULAR | Status: AC
  Filled 2024-01-27: qty 2

## 2024-01-27 MED ORDER — PROPOFOL 10 MG/ML IV BOLUS
INTRAVENOUS | Status: AC
  Filled 2024-01-27: qty 20

## 2024-01-27 MED ORDER — OXYCODONE HCL 5 MG PO TABS: 5.0000 mg | ORAL_TABLET | Freq: Once | ORAL | Status: DC | PRN

## 2024-01-27 MED ORDER — LIDOCAINE VISCOUS HCL 2 % MT SOLN: 10.0000 mL | Freq: Four times a day (QID) | OROMUCOSAL | 0 refills | Status: DC | PRN

## 2024-01-27 MED ORDER — CHLORHEXIDINE GLUCONATE 0.12 % MT SOLN: 15.0000 mL | Freq: Two times a day (BID) | OROMUCOSAL | 0 refills | Status: DC

## 2024-01-27 MED ORDER — DEXAMETHASONE SODIUM PHOSPHATE 10 MG/ML IJ SOLN
INTRAMUSCULAR | Status: AC
  Filled 2024-01-27: qty 1

## 2024-01-27 MED ORDER — OXYCODONE HCL 5 MG/5ML PO SOLN: 5.0000 mg | Freq: Once | ORAL | Status: DC | PRN

## 2024-01-27 MED ORDER — LACTATED RINGERS IV SOLN: INTRAVENOUS | Status: DC | PRN

## 2024-01-27 MED ORDER — FENTANYL CITRATE (PF) 100 MCG/2ML IJ SOLN
INTRAMUSCULAR | Status: AC
Start: 2024-01-27 — End: ?
  Filled 2024-01-27: qty 2

## 2024-01-27 MED ORDER — ONDANSETRON HCL 4 MG/2ML IJ SOLN
INTRAMUSCULAR | Status: DC | PRN
  Administered 2024-01-27: 4 mg via INTRAVENOUS

## 2024-01-27 MED ORDER — ROCURONIUM BROMIDE 10 MG/ML (PF) SYRINGE
PREFILLED_SYRINGE | INTRAVENOUS | Status: AC
  Filled 2024-01-27: qty 10

## 2024-01-27 MED ORDER — SUGAMMADEX SODIUM 200 MG/2ML IV SOLN
INTRAVENOUS | Status: DC | PRN
  Administered 2024-01-27: 250 mg via INTRAVENOUS

## 2024-01-27 MED ORDER — LIDOCAINE-EPINEPHRINE 1 %-1:100000 IJ SOLN
INTRAMUSCULAR | Status: AC
  Filled 2024-01-27: qty 1

## 2024-01-27 MED ORDER — FENTANYL CITRATE (PF) 100 MCG/2ML IJ SOLN
INTRAMUSCULAR | Status: DC | PRN
  Administered 2024-01-27 (×2): 50 ug via INTRAVENOUS

## 2024-01-27 MED ORDER — LIDOCAINE HCL (PF) 2 % IJ SOLN
INTRAMUSCULAR | Status: AC
  Filled 2024-01-27: qty 5

## 2024-01-27 MED ORDER — HYDROCODONE-ACETAMINOPHEN 7.5-325 MG/15ML PO SOLN: 10.0000 mL | Freq: Four times a day (QID) | ORAL | 0 refills | Status: DC | PRN

## 2024-01-27 MED ORDER — MIDAZOLAM HCL 2 MG/2ML IJ SOLN
INTRAMUSCULAR | Status: AC
Start: 2024-01-27 — End: ?
  Filled 2024-01-27: qty 2

## 2024-01-27 SURGICAL SUPPLY — 13 items
CORD BIP STRL DISP 12FT (MISCELLANEOUS) IMPLANT
FORCEPS JEWEL BIP 4-3/4 STR (INSTRUMENTS) IMPLANT
GAUZE 4X4 16PLY ~~LOC~~+RFID DBL (SPONGE) ×1 IMPLANT
GLOVE BIO SURGEON STRL SZ7.5 (GLOVE) ×1 IMPLANT
GOWN STRL REUS W/ TWL LRG LVL3 (GOWN DISPOSABLE) ×2 IMPLANT
HANDLE SUCTION POOLE (INSTRUMENTS) ×1 IMPLANT
MANIFOLD NEPTUNE II (INSTRUMENTS) ×1 IMPLANT
NS IRRIG 500ML POUR BTL (IV SOLUTION) ×1 IMPLANT
PACK HEAD/NECK (MISCELLANEOUS) ×1 IMPLANT
SUCTION TUBE FRAZIER 10FR DISP (SUCTIONS) IMPLANT
SUT CHROMIC 4 0 RB 1X27 (SUTURE) ×1 IMPLANT
SUT SILK 0 CT 1 30 (SUTURE) IMPLANT
SYR 3ML LL SCALE MARK (SYRINGE) ×1 IMPLANT

## 2024-01-27 NOTE — Anesthesia Procedure Notes (Signed)
 Procedure Name: Intubation Date/Time: 01/27/2024 9:49 AM  Performed by: Ellwood Haber, CRNAPre-anesthesia Checklist: Patient identified, Patient being monitored, Timeout performed, Emergency Drugs available and Suction available Patient Re-evaluated:Patient Re-evaluated prior to induction Oxygen  Delivery Method: Circle system utilized Preoxygenation: Pre-oxygenation with 100% oxygen  Induction Type: IV induction Ventilation: Mask ventilation without difficulty Laryngoscope Size: 3 and McGrath Grade View: Grade I Tube type: Oral Tube size: 7.0 mm Number of attempts: 1 Airway Equipment and Method: Stylet Placement Confirmation: ETT inserted through vocal cords under direct vision, positive ETCO2 and breath sounds checked- equal and bilateral Secured at: 23 cm Tube secured with: Tape Dental Injury: Teeth and Oropharynx as per pre-operative assessment  Comments: Smooth atraumatic intubation, no complications noted.

## 2024-01-27 NOTE — Transfer of Care (Signed)
 Immediate Anesthesia Transfer of Care Note  Patient: Lawrence Wells  Procedure(s) Performed: EXCISION, LESION, TONGUE (Bilateral: Mouth)  Patient Location: PACU  Anesthesia Type:General  Level of Consciousness: awake, alert , and oriented  Airway & Oxygen  Therapy: Patient Spontanous Breathing and Patient connected to face mask oxygen   Post-op Assessment: Report given to RN and Post -op Vital signs reviewed and stable  Post vital signs: Reviewed and stable  Last Vitals:  Vitals Value Taken Time  BP 116/76 1009  Temp 35.9 1009  Pulse 58 01/27/24 1009  Resp 18 01/27/24 1009  SpO2 100 % 01/27/24 1009  Vitals shown include unfiled device data.  Last Pain:  Vitals:   01/27/24 0838  PainSc: 0-No pain         Complications: No notable events documented.

## 2024-01-27 NOTE — Op Note (Signed)
..  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  Completely excised.  Procedure: After the patient was identified in holding and the history and physical and consent was reviewed, the patient was taken to the operating room and placed in a supine position.  General endotracheal anesthesia was induced in the normal fashion.    At this time, a bite block mouthgag was inserted into the patient's oral cavity without injury to teeth, lips, or gums.  A zero silk suture was placed at midline through patient's tongue for retraction purposes.  The patient's tongue was gently retracted laterally and 1ml of 1% lidocaine  with 1:100,000 epinephrine was injected into the patient's ventral tongue and floor of mouth.  Using micro-bioplar forceps the large papillomatous lesion was excised in it's entirety.  Hemostasis was achieved.  The wound was closed with a single 5.0 chromic suture.  Care was taken to avoid injury to the submandibular ducts.    Following this, the bite block was removed and midline tongue suture removed.  The care of patient was returned to anesthesia, awakened, and transferred to recovery in stable condition.  Dispo:  PACU to home  Plan: Soft diet.  Follow pathology.  Fluid hydration  Recheck my office three weeks.   Allene Furuya 10:04 AM 01/27/2024

## 2024-01-27 NOTE — H&P (Signed)
 ..  History and Physical paper copy reviewed and updated date of procedure and will be scanned into system.  Patient seen and examined.

## 2024-01-27 NOTE — Anesthesia Postprocedure Evaluation (Signed)
 Anesthesia Post Note  Patient: Lawrence Wells  Procedure(s) Performed: EXCISION, LESION, TONGUE (Bilateral: Mouth)  Patient location during evaluation: PACU Anesthesia Type: General Level of consciousness: awake and alert, oriented and patient cooperative Pain management: pain level controlled Vital Signs Assessment: post-procedure vital signs reviewed and stable Respiratory status: spontaneous breathing, nonlabored ventilation and respiratory function stable Cardiovascular status: blood pressure returned to baseline and stable Postop Assessment: adequate PO intake Anesthetic complications: no   No notable events documented.   Last Vitals:  Vitals:   01/27/24 1030 01/27/24 1104  BP: 104/76 106/80  Pulse: (!) 59 75  Resp: 20 20  Temp:  36.6 C  SpO2: 95% 95%    Last Pain:  Vitals:   01/27/24 1104  TempSrc: Oral  PainSc: 0-No pain                 Edwing Figley

## 2024-01-28 ENCOUNTER — Encounter: Payer: Self-pay | Admitting: Otolaryngology

## 2024-01-28 LAB — SURGICAL PATHOLOGY

## 2024-02-03 DIAGNOSIS — M0579 Rheumatoid arthritis with rheumatoid factor of multiple sites without organ or systems involvement: Secondary | ICD-10-CM | POA: Diagnosis not present

## 2024-02-03 DIAGNOSIS — Z7960 Long term (current) use of unspecified immunomodulators and immunosuppressants: Secondary | ICD-10-CM | POA: Diagnosis not present

## 2024-02-03 DIAGNOSIS — J849 Interstitial pulmonary disease, unspecified: Secondary | ICD-10-CM | POA: Diagnosis not present

## 2024-02-10 DIAGNOSIS — M0579 Rheumatoid arthritis with rheumatoid factor of multiple sites without organ or systems involvement: Secondary | ICD-10-CM | POA: Diagnosis not present

## 2024-02-17 ENCOUNTER — Encounter: Payer: Self-pay | Admitting: Family Medicine

## 2024-02-17 ENCOUNTER — Ambulatory Visit (INDEPENDENT_AMBULATORY_CARE_PROVIDER_SITE_OTHER): Payer: Self-pay | Admitting: Family Medicine

## 2024-02-17 VITALS — BP 110/78 | HR 68 | Ht 70.0 in | Wt 136.1 lb

## 2024-02-17 DIAGNOSIS — R6889 Other general symptoms and signs: Secondary | ICD-10-CM | POA: Diagnosis not present

## 2024-02-17 DIAGNOSIS — J849 Interstitial pulmonary disease, unspecified: Secondary | ICD-10-CM

## 2024-02-17 DIAGNOSIS — R42 Dizziness and giddiness: Secondary | ICD-10-CM

## 2024-02-17 DIAGNOSIS — J84112 Idiopathic pulmonary fibrosis: Secondary | ICD-10-CM | POA: Diagnosis not present

## 2024-02-17 NOTE — Progress Notes (Unsigned)
 Subjective:    Patient ID: Lawrence Wells, male    DOB: 01-Nov-1955, 68 y.o.   MRN: [id number]  Lawrence Wells is a 68 y.o. male presenting on 02/17/2024 for Rheumatoid Arthritis, Interstitial Lung Disease, and Memory Loss   HPI  ILD / Chronic  Followed by [facility] Pulmonology currently w Dr Geronimo BAIZE Now patient prefers to relocate Pulmonology over to [facility], to receive his care locally and request another opinion He has been waiting for [facility]  therapy but it was unable to be arranged, and still waiting on this   Followed by Rheumatology On Azathioprine  Injections   Followed by Dr Mevelyn   History of Episodic Migraines No further flares Has Rizatriptan  PRN  Memory Loss / Forgetfulness ***  ***Physical activity / wobbly with positional change  ***[place] [facility] cyst under tongue, dentist noticed  ***Rheumatoid arthritis   Health Maintenance: ***     02/17/2024    8:27 AM 08/15/2023    8:13 AM 04/25/2023    8:09 AM  Depression screen PHQ 2/9  Decreased Interest 0 0 0  Down, Depressed, Hopeless 0 1 1  PHQ - 2 Score 0 1 1  Altered sleeping 0 1 1  Tired, decreased energy 1 0 0  Change in appetite 1 0 0  Feeling bad or failure about yourself  1 1 1   Trouble concentrating 1 0 0  Moving slowly or fidgety/restless 2 0 0  Suicidal thoughts 1 0   PHQ-9 Score 7 3 3   Difficult doing work/chores Not difficult at all  Not difficult at all   [place]-Suicide Severity Rating Scale 1) Have you wished you were dead or wished you could go to sleep and not wake up? - Yes  2) Have you had any actual thoughts of killing yourself? - No  Skip questions 3,4, 5  6) Have you ever done anything, started to do anything, or prepared to do anything to end your life? - No      02/17/2024    8:27 AM 08/15/2023    8:13 AM 04/25/2023    8:09 AM 12/26/2021    4:07 PM  GAD 7 : Generalized Anxiety Score  Nervous, Anxious, on Edge 1 0 0 0  Control/stop worrying 1 0 1 1  Worry too  much - different things 1 0 1 1  Trouble relaxing 0 0 0 0  Restless 1 2 0 0  Easily annoyed or irritable 1 1 0 1  Afraid - awful might happen 0 0 0 1  Total GAD 7 Score 5 3 2 4   Anxiety Difficulty Not difficult at all   Somewhat difficult    Social History   Tobacco Use   Smoking status: Former    Current packs/day: 0.00    Average packs/day: 1.5 packs/day for 30.0 years (45.0 ttl pk-yrs)    Types: Cigarettes    Start date: 25    Quit date: 2007    Years since quitting: 18.5   Smokeless tobacco: Former    Types: Chew    Quit date: 2007   Tobacco comments:    Dip smokeless tobacco >20-30 years  Vaping Use   Vaping status: Never Used  Substance Use Topics   Alcohol use: Yes    Comment: occ   Drug use: No    Review of Systems Per HPI unless specifically indicated above     Objective:    BP 110/78 (BP Location: Right Arm, Patient Position: Sitting, Cuff Size: Normal)  Pulse 68   Ht 5' 10 (1.778 m)   Wt 136 lb 2 oz (61.7 kg)   BMI 19.53 kg/m   Wt Readings from Last 3 Encounters:  02/17/24 136 lb 2 oz (61.7 kg)  01/27/24 139 lb 15.9 oz (63.5 kg)  08/15/23 137 lb (62.1 kg)    Physical Exam     03/07/2023   10:39 AM 03/01/2022    9:55 AM 02/27/2021    9:45 AM  6CIT Screen  What Year? 0 points 0 points 0 points  What month? 0 points 0 points 0 points  What time? 0 points 0 points 0 points  Count back from 20 0 points 0 points 0 points  Months in reverse 0 points 0 points 0 points  Repeat phrase 0 points 0 points 0 points  Total Score 0 points 0 points 0 points    Results for orders placed or performed during the hospital encounter of 01/27/24  Surgical pathology   Collection Time: 01/27/24 12:00 AM  Result Value Ref Range   SURGICAL PATHOLOGY      SURGICAL PATHOLOGY [facility] [address] [place], [place] [address] Telephone [phone] or [phone] Fax [phone]  REPORT OF SURGICAL PATHOLOGY   Accession  #: [id number] Patient Name: Lawrence Wells, Lawrence Wells Visit # : [id number]  MRN: [id number] Physician: Milissa Hamming DOB/Age 08/11/1956 (Age: 58) Gender: M Collected Date: 01/27/2024 Received Date: 01/27/2024  FINAL DIAGNOSIS       1. Tongue, biopsy, Ventral lesion :       SQUAMOUS PAPILLOMA.      NO EVIDENCE OF DYSPLASIA OR MALIGNANCY.       DATE SIGNED OUT: 01/28/2024 ELECTRONIC SIGNATURE : Pepper Dutton Md, Pathologist, Electronic Signature  MICROSCOPIC DESCRIPTION  CASE COMMENTS STAINS USED IN DIAGNOSIS: H&E    CLINICAL HISTORY  SPECIMEN(S) OBTAINED 1. Tongue, biopsy, Ventral Lesion  SPECIMEN COMMENTS: SPECIMEN CLINICAL INFORMATION: 1. Tongue lesion    Gross Description 1. Ventral tongue lesion, received fresh and placed in formalin is a singl e 0.7 x 0.4 x 0.4 cm pale pink, unoriented tissue fragment. The specimen is submitted in toto in 1 block (1A).      [facility] 01/27/2024        Report signed out from the following location(s) [place] [address] [place], [place] [address] CLIA #: [id number]  [facility] [address] [place], [place] [address] CLIA #: [id number]       Assessment & Plan:   Problem List Items Addressed This Visit     ILD (interstitial lung disease) (HCC) - Primary     ***  No orders of the defined types were placed in this encounter.   No orders of the defined types were placed in this encounter.   Follow up plan: Return in about 6 months (around 08/19/2024) for 6 month fasting lab > 1 week later Annual Physical.  Future labs ordered for *** 07/2024   Marsa Officer, DO [facility] 02/17/2024, 8:43 AM

## 2024-02-17 NOTE — Patient Instructions (Addendum)
 Thank you for coming to the office today.  I believe that you have Forgetfulness issue with the memory. This is normal actually. It is not a long term memory or brain dysfunction problem.   Usually long term memory and recall is intact but forgetful of more every day things, names people faces locations is common. Forget where you put something or where you went etc. But this should not cause actual CONFUSION or DISORIENTATION. If you have difficulty with every day tasks this is a worsening problem we need to discuss  No further testing or treatment at this time. In future we can consider more labs or imaging or referral if needed.  Vitamins are helpful but do not resolve this problem.  It is good to use visual reminders and calendar and phone alarms to make sure you help yourself remember.  FYI you have passed the memory quiz / test each year at the wellness visit for medicare.  If this problem gets worse we can consider Neurology consultation and repeat scan imaging and labs.   DUE for FASTING BLOOD WORK (no food or drink after midnight before the lab appointment, only water or coffee without cream/sugar on the morning of)  SCHEDULE Lab Only visit in the morning at the clinic for lab draw in 6 MONTHS   - Make sure Lab Only appointment is at about 1 week before your next appointment, so that results will be available  For Lab Results, once available within 2-3 days of blood draw, you can can log in to MyChart online to view your results and a brief explanation. Also, we can discuss results at next follow-up visit.   Please schedule a Follow-up Appointment to: Return in about 6 months (around 08/19/2024) for 6 month fasting lab > 1 week later Annual Physical.  If you have any other questions or concerns, please feel free to call the office or send a message through MyChart. You may also schedule an earlier appointment if necessary.  Additionally, you may be receiving a survey about your  experience at our office within a few days to 1 week by e-mail or mail. We value your feedback.  Marsa Officer, DO [facility], [place]

## 2024-02-18 ENCOUNTER — Other Ambulatory Visit: Payer: Self-pay | Admitting: Family Medicine

## 2024-02-18 DIAGNOSIS — R7309 Other abnormal glucose: Secondary | ICD-10-CM

## 2024-02-18 DIAGNOSIS — E7800 Pure hypercholesterolemia, unspecified: Secondary | ICD-10-CM

## 2024-02-18 DIAGNOSIS — J849 Interstitial pulmonary disease, unspecified: Secondary | ICD-10-CM

## 2024-02-18 DIAGNOSIS — I700 Atherosclerosis of aorta: Secondary | ICD-10-CM

## 2024-02-18 DIAGNOSIS — Z0000 Encounter for general adult medical examination without abnormal findings: Secondary | ICD-10-CM

## 2024-02-18 DIAGNOSIS — N401 Enlarged prostate with lower urinary tract symptoms: Secondary | ICD-10-CM

## 2024-03-09 DIAGNOSIS — M0579 Rheumatoid arthritis with rheumatoid factor of multiple sites without organ or systems involvement: Secondary | ICD-10-CM | POA: Diagnosis not present

## 2024-03-12 ENCOUNTER — Ambulatory Visit (INDEPENDENT_AMBULATORY_CARE_PROVIDER_SITE_OTHER): Payer: 59

## 2024-03-12 DIAGNOSIS — Z0000 Encounter for general adult medical examination without abnormal findings: Secondary | ICD-10-CM | POA: Diagnosis not present

## 2024-03-12 NOTE — Patient Instructions (Addendum)
 [name] , Thank you for taking time out of your busy schedule to complete your Annual Wellness Visit with me. I enjoyed our conversation and look forward to speaking with you again next year. I, as well as your care team,  appreciate your ongoing commitment to your health goals. Please review the following plan we discussed and let me know if I can assist you in the future.   Follow up Visits: Next Medicare AWV with our clinical staff:   03/25/25 @ 12:40 PM BY PHONE Have you seen your provider in the last 6 months (3 months if uncontrolled diabetes)? Yes  Clinician Recommendations:  Aim for 30 minutes of exercise or brisk walking, 6-8 glasses of water, and 5 servings of fruits and vegetables each day. TAKE CARE!      This is a list of the screening recommended for you and due dates:  Health Maintenance  Topic Date Due   Colon Cancer Screening  Never done   Flu Shot  03/19/2024   Medicare Annual Wellness Visit  03/12/2025   DTaP/Tdap/Td vaccine (3 - Td or Tdap) 05/08/2027   Pneumococcal Vaccine for age over 83  Completed   Hepatitis C Screening  Completed   Zoster (Shingles) Vaccine  Completed   Hepatitis B Vaccine  Aged Out   HPV Vaccine  Aged Out   Meningitis B Vaccine  Aged Out   COVID-19 Vaccine  Discontinued    Advanced directives: (ACP Link)Information on Advanced Care Planning can be found at [employer]  Best boy Advance Health Care Directives Advance Health Care Directives. [email or web address]  Advance Care Planning is important because it:  [x]  Makes sure you receive the medical care that is consistent with your values, goals, and preferences  [x]  It provides guidance to your family and loved ones and reduces their decisional burden about whether or not they are making the right decisions based on your wishes.  Follow the link provided in your after visit summary or read over the paperwork we have mailed to you to help you started getting your Advance Directives in  place. If you need assistance in completing these, please reach out to us  so that we can help you!

## 2024-03-12 NOTE — Progress Notes (Signed)
 Subjective:   Lawrence Wells is a 68 y.o. who presents for a Medicare Wellness preventive visit.  As a reminder, Annual Wellness Visits don't include a physical exam, and some assessments may be limited, especially if this visit is performed virtually. We may recommend an in-person follow-up visit with your provider if needed.  Visit Complete: Virtual I connected with  [name] on 03/12/24 by a audio enabled telemedicine application and verified that I am speaking with the correct person using two identifiers.  Patient Location: Home  Provider Location: Home Office  I discussed the limitations of evaluation and management by telemedicine. The patient expressed understanding and agreed to proceed.  Vital Signs: Because this visit was a virtual/telehealth visit, some criteria may be missing or patient reported. Any vitals not documented were not able to be obtained and vitals that have been documented are patient reported.  VideoDeclined- This patient declined Librarian, academic. Therefore the visit was completed with audio only.  Persons Participating in Visit: Patient.  AWV Questionnaire: No: Patient Medicare AWV questionnaire was not completed prior to this visit.  Cardiac Risk Factors include: advanced age (>87men, >56 women);dyslipidemia;male gender     Objective:    There were no vitals filed for this visit. There is no height or weight on file to calculate BMI.     03/12/2024   12:48 PM 03/07/2023   10:37 AM 03/01/2022    9:53 AM 02/27/2021    9:42 AM 05/03/2020   10:55 AM 02/07/2020   12:51 PM 01/19/2020    6:44 PM  Advanced Directives  Does Patient Have a Medical Advance Directive? No No No No Yes No No  Would patient like information on creating a medical advance directive? No - Patient declined No - Patient declined No - Patient declined    No - Patient declined    Current Medications (verified) Outpatient Encounter Medications as of  03/12/2024  Medication Sig   azaTHIOprine  (IMURAN ) 50 MG tablet Take 2 tablets (100 mg total) by mouth daily.   HYDROcodone -acetaminophen  (HYCET) 7.5-325 mg/15 ml solution Take 10 mLs by mouth 4 (four) times daily as needed for moderate pain (pain score 4-6).   hydroxychloroquine (PLAQUENIL) 200 MG tablet Take 1 tablet by mouth daily.   Multiple Vitamins-Minerals (CENTRUM SILVER 50+MEN PO) Take by mouth daily.   Pirfenidone  267 MG TABS Take 2 tablets (534 mg total) by mouth 3 (three) times daily with meals.   No facility-administered encounter medications on file as of 03/12/2024.    Allergies (verified) Atorvastatin    History: Past Medical History:  Diagnosis Date   Arthritis    CAD (coronary artery disease)    Centrilobular emphysema (HCC)    Collagen vascular disease (HCC)    Rhematoid Arthritis   COPD (chronic obstructive pulmonary disease) (HCC)    Coronary artery disease    Dysphagia    Dyspnea    Emphysema lung (HCC)    Grade I diastolic dysfunction    Hearing aid worn    bilateral   Hyperlipidemia    Idiopathic pulmonary fibrosis (HCC)    Interstitial lung disease (HCC)    Mild mitral regurgitation by prior echocardiogram    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HCC)    Rheumatoid arthritis involving multiple sites with positive rheumatoid factor (HCC)    Seborrheic dermatitis of scalp    Sensorineural hearing loss, bilateral    Severe protein-calorie malnutrition (HCC)    Past Surgical History:  Procedure Laterality Date   EXCISION  OF TONGUE LESION Bilateral 01/27/2024   Procedure: EXCISION, LESION, TONGUE;  Surgeon: Milissa Hamming, MD;  Location: [facility] ORS;  Service: ENT;  Laterality: Bilateral;   SINUS SURGERY WITH INSTATRAK     Family History  Problem Relation Age of Onset   Heart disease Mother    Heart attack Mother 69   Arthritis Mother    Heart disease Father    Heart attack Father 7   Healthy Sister    Hyperlipidemia Brother    Throat cancer Brother 60   Heart  disease Brother    Healthy Son    Healthy Daughter    Social History   Socioeconomic History   Marital status: Married    Spouse name: [name]   Number of children: Not on file   Years of education: High School   Highest education level: Not on file  Occupational History   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[employer])    Comment: Retired 2012  Tobacco Use   Smoking status: Former    Current packs/day: 0.00    Average packs/day: 1.5 packs/day for 30.0 years (45.0 ttl pk-yrs)    Types: Cigarettes    Start date: 57    Quit date: 2007    Years since quitting: 18.5   Smokeless tobacco: Former    Types: Chew    Quit date: 2007   Tobacco comments:    Dip smokeless tobacco >20-30 years  Vaping Use   Vaping status: Never Used  Substance and Sexual Activity   Alcohol use: Yes    Comment: occ   Drug use: No   Sexual activity: Not Currently  Other Topics Concern   Not on file  Social History Narrative   Right handed   One story home   Drinks caffeine   Social Drivers of Health   Financial Resource Strain: Low Risk  (03/12/2024)   Overall Financial Resource Strain (CARDIA)    Difficulty of Paying Living Expenses: Not hard at all  Food Insecurity: No Food Insecurity (03/12/2024)   Hunger Vital Sign    Worried About Running Out of Food in the Last Year: Never true    Ran Out of Food in the Last Year: Never true  Transportation Needs: No Transportation Needs (03/12/2024)   PRAPARE - Administrator, Civil Service (Medical): No    Lack of Transportation (Non-Medical): No  Physical Activity: Insufficiently Active (03/12/2024)   Exercise Vital Sign    Days of Exercise per Week: 2 days    Minutes of Exercise per Session: 60 min  Stress: No Stress Concern Present (03/12/2024)   [employer] of Occupational Health - Occupational Stress Questionnaire    Feeling of Stress: Not at all  Social Connections: Moderately Isolated (03/12/2024)   Social Connection and Isolation  Panel    Frequency of Communication with Friends and Family: More than three times a week    Frequency of Social Gatherings with Friends and Family: Once a week    Attends Religious Services: Never    Database administrator or Organizations: No    Attends Engineer, structural: Never    Marital Status: Married    Tobacco Counseling Counseling given: Not Answered Tobacco comments: Dip smokeless tobacco >20-30 years    Clinical Intake:  Pre-visit preparation completed: Yes  Pain : No/denies pain     Nutritional Risks: None Diabetes: No  Lab Results  Component Value Date   HGBA1C 5.7 (H) 08/15/2023   HGBA1C 5.7 (H)  09/02/2019   HGBA1C 5.5 05/07/2017     How often do you need to have someone help you when you read instructions, pamphlets, or other written materials from your doctor or pharmacy?: 1 - Never  Interpreter Needed?: No  Information entered by :: JHONNIE DAS, LPN   Activities of Daily Living     03/12/2024   12:50 PM 04/25/2023    8:09 AM  In your present state of health, do you have any difficulty performing the following activities:  Hearing? 1 1  Vision? 0 1  Difficulty concentrating or making decisions? 0 1  Walking or climbing stairs? 0 0  Dressing or bathing? 0 0  Doing errands, shopping? 0 0  Preparing Food and eating ? N   Using the Toilet? N   In the past six months, have you accidently leaked urine? N   Do you have problems with loss of bowel control? N   Managing your Medications? N   Managing your Finances? N   Housekeeping or managing your Housekeeping? N     Patient Care Team: Edman Marsa PARAS, DO as PCP - [facility]  I have updated your Care Teams any recent Medical Services you may have received from other providers in the past year.     Assessment:   This is a routine wellness examination for Bell.  Hearing/Vision screen Hearing Screening - Comments:: WEARS AIDS, BOTH  EARS Vision Screening - Comments:: NO GLASSES, HAD CATARACT SGY- [place]   Goals Addressed             This Visit's Progress    Cut out extra servings         Depression Screen     03/12/2024   12:46 PM 02/17/2024    8:27 AM 08/15/2023    8:13 AM 04/25/2023    8:09 AM 03/07/2023   10:34 AM 03/01/2022    9:51 AM 12/26/2021    4:07 PM  PHQ 2/9 Scores  PHQ - 2 Score 0 0 1 1 2  0 0  PHQ- 9 Score 0 7 3 3 4 2 6     Fall Risk     03/12/2024   12:49 PM 02/17/2024    8:26 AM 08/15/2023    8:13 AM 04/25/2023    8:10 AM 03/07/2023   10:38 AM  Fall Risk   Falls in the past year? 1 1 0 1 0  Number falls in past yr: 1 1   0  Injury with Fall? 0 0  1 0  Risk for fall due to : History of fall(s) History of fall(s)   No Fall Risks  Follow up Falls evaluation completed;Falls prevention discussed    Falls prevention discussed;Falls evaluation completed    MEDICARE RISK AT HOME:  Medicare Risk at Home Any stairs in or around the home?: Yes If so, are there any without handrails?: No Home free of loose throw rugs in walkways, pet beds, electrical cords, etc?: Yes Adequate lighting in your home to reduce risk of falls?: Yes Life alert?: No Use of a cane, walker or w/c?: No Grab bars in the bathroom?: Yes Shower chair or bench in shower?: No Elevated toilet seat or a handicapped toilet?: No  TIMED UP AND GO:  Was the test performed?  No  Cognitive Function: 6CIT completed        03/12/2024   12:54 PM 03/07/2023   10:39 AM 03/01/2022    9:55 AM 02/27/2021  9:45 AM  6CIT Screen  What Year? 0 points 0 points 0 points 0 points  What month? 0 points 0 points 0 points 0 points  What time? 0 points 0 points 0 points 0 points  Count back from 20 0 points 0 points 0 points 0 points  Months in reverse 0 points 0 points 0 points 0 points  Repeat phrase 4 points 0 points 0 points 0 points  Total Score 4 points 0 points 0 points 0 points    Immunizations Immunization History  Administered  Date(s) Administered   Fluad Quad(high Dose 65+) 08/10/2021, 04/30/2022   Fluad Trivalent(High Dose 65+) 04/25/2023   Influenza, High Dose Seasonal PF 07/28/2020   PFIZER(Purple Top)SARS-COV-2 Vaccination 11/22/2019, 12/15/2019   PNEUMOCOCCAL CONJUGATE-20 04/25/2023   Pneumococcal Conjugate-13 09/08/2019   Tdap 04/19/2015, 05/07/2017   Zoster Recombinant(Shingrix ) 09/29/2019, 11/27/2019    Screening Tests Health Maintenance  Topic Date Due   Colonoscopy  Never done   INFLUENZA VACCINE  03/19/2024   Medicare Annual Wellness (AWV)  03/12/2025   DTaP/Tdap/Td (3 - Td or Tdap) 05/08/2027   Pneumococcal Vaccine: 50+ Years  Completed   Hepatitis C Screening  Completed   Zoster Vaccines- Shingrix   Completed   Hepatitis B Vaccines  Aged Out   HPV VACCINES  Aged Out   Meningococcal B Vaccine  Aged Out   COVID-19 Vaccine  Discontinued    Health Maintenance  Health Maintenance Due  Topic Date Due   Colonoscopy  Never done   Health Maintenance Items Addressed: DECLINES COLONOSCOPY REFERRAL; UP TO DATE W/ SHOTS EXCEPT COVID  Additional Screening:  Vision Screening: Recommended annual ophthalmology exams for early detection of glaucoma and other disorders of the eye. Would you like a referral to an eye doctor? No    Dental Screening: Recommended annual dental exams for proper oral hygiene  Community Resource Referral / Chronic Care Management: CRR required this visit?  No   CCM required this visit?  No   Plan:    I have personally reviewed and noted the following in the patient's chart:   Medical and social history Use of alcohol, tobacco or illicit drugs  Current medications and supplements including opioid prescriptions. Patient is currently taking opioid prescriptions. Information provided to patient regarding non-opioid alternatives. Patient advised to discuss non-opioid treatment plan with their provider. Functional ability and status Nutritional status Physical  activity Advanced directives List of other physicians Hospitalizations, surgeries, and ER visits in previous 12 months Vitals Screenings to include cognitive, depression, and falls Referrals and appointments  In addition, I have reviewed and discussed with patient certain preventive protocols, quality metrics, and best practice recommendations. A written personalized care plan for preventive services as well as general preventive health recommendations were provided to patient.   Jhonnie GORMAN Das, LPN   [id number]/74/[id number]   After Visit Summary: (MyChart) Due to this being a telephonic visit, the after visit summary with patients personalized plan was offered to patient via MyChart   Notes: Nothing significant to report at this time.

## 2024-03-16 DIAGNOSIS — K080 Exfoliation of teeth due to systemic causes: Secondary | ICD-10-CM | POA: Diagnosis not present

## 2024-03-23 DIAGNOSIS — H01024 Squamous blepharitis left upper eyelid: Secondary | ICD-10-CM | POA: Diagnosis not present

## 2024-03-23 DIAGNOSIS — H01021 Squamous blepharitis right upper eyelid: Secondary | ICD-10-CM | POA: Diagnosis not present

## 2024-03-23 DIAGNOSIS — B880 Other acariasis: Secondary | ICD-10-CM | POA: Diagnosis not present

## 2024-03-23 DIAGNOSIS — L718 Other rosacea: Secondary | ICD-10-CM | POA: Diagnosis not present

## 2024-04-06 DIAGNOSIS — M0579 Rheumatoid arthritis with rheumatoid factor of multiple sites without organ or systems involvement: Secondary | ICD-10-CM | POA: Diagnosis not present

## 2024-04-15 DIAGNOSIS — K080 Exfoliation of teeth due to systemic causes: Secondary | ICD-10-CM | POA: Diagnosis not present

## 2024-05-04 DIAGNOSIS — M0579 Rheumatoid arthritis with rheumatoid factor of multiple sites without organ or systems involvement: Secondary | ICD-10-CM | POA: Diagnosis not present

## 2024-05-19 ENCOUNTER — Other Ambulatory Visit
Admission: RE | Admit: 2024-05-19 | Discharge: 2024-05-19 | Disposition: A | Discharge status: Home / Self Care | Source: Ambulatory Visit | Attending: Student in an Organized Health Care Education/Training Program | Admitting: Student in an Organized Health Care Education/Training Program

## 2024-05-19 ENCOUNTER — Other Ambulatory Visit (HOSPITAL_COMMUNITY): Payer: Self-pay

## 2024-05-19 ENCOUNTER — Encounter: Payer: Self-pay | Admitting: Student in an Organized Health Care Education/Training Program

## 2024-05-19 ENCOUNTER — Telehealth: Payer: Self-pay

## 2024-05-19 ENCOUNTER — Ambulatory Visit (INDEPENDENT_AMBULATORY_CARE_PROVIDER_SITE_OTHER): Admitting: Student in an Organized Health Care Education/Training Program

## 2024-05-19 VITALS — BP 122/78 | HR 60 | Temp 97.5°F | Ht 70.0 in | Wt 135.2 lb

## 2024-05-19 DIAGNOSIS — J449 Chronic obstructive pulmonary disease, unspecified: Secondary | ICD-10-CM

## 2024-05-19 DIAGNOSIS — J8489 Other specified interstitial pulmonary diseases: Secondary | ICD-10-CM | POA: Insufficient documentation

## 2024-05-19 DIAGNOSIS — M359 Systemic involvement of connective tissue, unspecified: Secondary | ICD-10-CM | POA: Diagnosis not present

## 2024-05-19 LAB — HEPATIC FUNCTION PANEL
ALT: 14 U/L (ref 0–44)
AST: 25 U/L (ref 15–41)
Albumin: 4 g/dL (ref 3.5–5.0)
Alkaline Phosphatase: 61 U/L (ref 38–126)
Bilirubin, Direct: <0.1 mg/dL (ref 0.0–0.2)
Total Bilirubin: 0.7 mg/dL (ref 0.0–1.2)
Total Protein: 7.3 g/dL (ref 6.5–8.1)

## 2024-05-19 MED ORDER — PIRFENIDONE 267 MG PO TABS: 534.0000 mg | ORAL_TABLET | Freq: Three times a day (TID) | ORAL | 12 refills | Status: DC

## 2024-05-19 MED ORDER — STIOLTO RESPIMAT 2.5-2.5 MCG/ACT IN AERS
2.0000 | INHALATION_SPRAY | Freq: Every day | RESPIRATORY_TRACT | 12 refills | Status: AC
  Filled 2024-05-19: qty 4, 30d supply, fill #0

## 2024-05-19 NOTE — Progress Notes (Signed)
 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 Tobie from [facility] rheumatology. He was previously followed by Dr. Geronimo at our interstitial lung disease center.  He is currently immunosuppressed with azathioprine  and hydroxychloroquine with anti-TNF-alpha monoclonal antibody (Certolizumab). He was also initiated on pirfenidone  given fibrotic changes. He was previously unable to afford nintedanib.   He had high-resolution CT scans of the chest in May 2024, June 2023, and June 2021. CT scan of the chest in May 2024 showed stable findings consistent with UIP as well as centrilobular and paraseptal emphysema.  His last pulmonary function test was from March 2024 showing normal spirometry but with a significantly decreased DLCO (29%).  PFTs prior to that in September 2023 had shown a DLCO of 39% predicted.   Patient has had stable CTD-ILD secondary to rheumatoid arthritis that is currently managed with immunosuppression with azathioprine , hydroxychloroquine, and anti-TNF-alpha. Review of the literature does not show strong evidence to suggest the use of one immune suppressing agent over another but overall consensus appears to be towards disease modification and control with immune suppression. Methotrexate is avoided given potential for pneumotoxicity.  One study looked at the use of azathioprine  versus mycophenolate versus rituximab versus placebo and showed stability in FVC with the use of any of the immune suppressing agents when compared to placebo without difference between groups.  Other studies have looked at the use of antifibrotic such as pirfenidone  or nintedanib and CTD-ILD and there seems to be a (not overwhelmingly strong) signal in the data  for benefit (not based on RCT's).  The  RELIEF study looking at the use of pirfenidone  in CTD-ILD patients was halted secondary to futility due to inability to recruit enough patients).  It is also postulated that pirfenidone  has a positive effect on the course of disease by decreasing TNF alpha.  Similarly, the use of anti-TNF alpha might be beneficial though concern for pneumotoxicity remains. Nerandomilast has yet to be approved for this indication.  He will continue on his immune suppression regimen as prescribed by rheumatology. Today, we will re-initiate pirfenidone  and attempt to titrate to optimal dose (2 pills twice daily). I will also check his liver function tests prior to this. I will also involve our pharmacist to help with medication titration. I will repeat his PFT's and HRCT prior to our follow up, and should there be disease activity acceleration will reach out to his rheumatologist for mycophenolate initiation.   - Pirfenidone  267 MG TABS; Take 2 tablets (534 mg total) by mouth 3 (three) times daily with meals.  Dispense: 180 tablet; Refill: 12 - Tiotropium Bromide-Olodaterol (STIOLTO RESPIMAT) 2.5-2.5 MCG/ACT AERS; Inhale 2 puffs into the lungs daily.  Dispense: 60 each; Refill: 12 - Hepatic function panel; Future - Pulmonary Function Test; Future - CT CHEST HIGH RESOLUTION; Future   Return in about 3 months (around 08/19/2024).  I spent 45 minutes caring for this patient today, including preparing to see the patient, obtaining a medical history , reviewing a separately obtained history, performing a medically appropriate examination and/or evaluation, counseling and educating the patient/family/caregiver, ordering medications, tests, or procedures, documenting clinical information in the electronic health record, and independently interpreting results (not separately reported/billed) and communicating results to the patient/family/caregiver  Belva November, MD [facility]   End of visit  medications:  Meds ordered this encounter  Medications   Pirfenidone  267 MG TABS    Sig: Take 2 tablets (534 mg total) by mouth 3 (three) times daily with meals.    Dispense:  180 tablet    Refill:  12    Maintenance dose   Tiotropium Bromide-Olodaterol (STIOLTO RESPIMAT) 2.5-2.5 MCG/ACT AERS    Sig: Inhale 2 puffs into the lungs daily.    Dispense:  60 each    Refill:  12     Current Outpatient Medications:    azaTHIOprine  (IMURAN ) 50 MG tablet, Take 2 tablets (100 mg total) by mouth daily., Disp: 180 tablet, Rfl: 0   HYDROcodone -acetaminophen  (HYCET) 7.5-325 mg/15 ml solution, Take 10 mLs by mouth 4 (four) times daily as needed for moderate pain (pain score 4-6)., Disp: 100 mL, Rfl: 0   hydroxychloroquine (PLAQUENIL) 200 MG tablet, Take 1 tablet by mouth daily., Disp: , Rfl:    Multiple Vitamins-Minerals (CENTRUM SILVER 50+MEN PO), Take by mouth daily., Disp: , Rfl:    Tiotropium Bromide-Olodaterol (STIOLTO RESPIMAT) 2.5-2.5 MCG/ACT AERS, Inhale 2 puffs into the lungs daily., Disp: 60 each, Rfl: 12   Pirfenidone  267 MG TABS, Take 2 tablets (534 mg total) by mouth 3 (three) times daily with meals., Disp: 180 tablet, Rfl: 12   Subjective:   PATIENT ID: Lawrence Wells Angles GENDER: male DOB: [birth year], MRN: [id number]  Chief Complaint  Patient presents with   Interstitial Lung Disease    SOB with exertion and when talking. No wheezing. Cough in the mornings when he gets up.    HPI  The patient is a pleasant 68 year old male with a past medical history of connective tissue disease associated interstitial lung disease presenting for follow up. He was previously followed in [place] by Dr. Geronimo and requested to switch to [facility] given difficulty with transportation.  Patient was last seen by Dr. Geronimo in May 2024 for his CTD-ILD diagnosed in 2021 following an abnormal chest x-ray.  At that time, the patient was significantly short of breath and was requiring oxygen .  During  previous visits with Dr. Geronimo, he was initiated on nintedanib (subsequently switched to Pirfenidone ) and did also establish care with rheumatology for rheumatoid arthritis and was started on immunosuppression.   His course was complicated by difficulty swallowing/dysphagia for which he was seen by his primary care physician and subsequently referred to ENT.  He was diagnosed with tonsillar cancer, treated with chemoradiation in 2021, and is now in remission. His PEG tube was removed in February 2022.  He was evaluated for lung transplant at [facility] but this was abandoned secondary to his cancer diagnosis.  He discontinued his oxygen  around the same time in 2022.  Following his last visit with Dr. Geronimo in May 2024, he was restarted on pirfenidone . There has been a significant difficulty obtaining the pirfenidone  secondary to cost. He does follow closely with rheumatology (Dr. Tobie at [facility] rheumatology) and is maintained on immunosuppression with azathioprine  100 mg daily, hydroxychloroquine 200 mg daily, and Certolizumab (anti-TNF-Alpha monoclonal antibody).   Return Visit 04/28/2023:  Interim history shows symptoms to be stable.  He feels that his shortness of breath has been unchanged over the past couple of years and he is able to do plenty of activity (albeit slowly) without limitation.  He does feel shortness of breath when he attempts to mow the lawn or do strenuous physical activity.  He denies any cough, chest pain, sputum production, wheezing, or chest tightness.   Return  Visit 05/19/2024:  Continues to experience exertional dyspnea, with some associated dry cough. No chest pain or chest tightness reported. Patient remains off oxygen  therapy. He had ran low on pirfenidone  and was taking it twice a week. He ran out of his inhalers and was not taking them. He is compliant and follows up closely with rheumatology, and continues to receive his certolizumab infusions.   Patient  reports history of smoking cigarettes between 1983 and 2007 but between 1 and 2 packs/day.  He has no history of vaping or MJ use.  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  He is also worked in the [employer].  Ancillary information including prior medications, full medical/surgical/family/social histories, and PFTs (when available) are listed below and have been reviewed.    Review of Systems  Constitutional:  Negative for chills, fever, malaise/fatigue and weight loss.  Respiratory:  Positive for cough and shortness of breath. Negative for hemoptysis, sputum production and wheezing.   Cardiovascular:  Negative for chest pain.     Objective:   Vitals:   05/19/24 1057  BP: 122/78  Pulse: 60  Temp: (!) 97.5 F (36.4 C)  SpO2: 96%  Weight: 135 lb 3.2 oz (61.3 kg)  Height: 5' 10 (1.778 m)   96% on RA  BMI Readings from Last 3 Encounters:  05/19/24 19.40 kg/m  02/17/24 19.53 kg/m  01/27/24 20.09 kg/m   Wt Readings from Last 3 Encounters:  05/19/24 135 lb 3.2 oz (61.3 kg)  02/17/24 136 lb 2 oz (61.7 kg)  01/27/24 139 lb 15.9 oz (63.5 kg)    Physical Exam Constitutional:      Appearance: Normal appearance.  Cardiovascular:     Rate and Rhythm: Normal rate and regular rhythm.     Pulses: Normal pulses.     Heart sounds: Normal heart sounds.  Pulmonary:     Effort: Pulmonary effort is normal.     Breath sounds: Rales present.  Neurological:     General: No focal deficit present.     Mental Status: He is alert. Mental status is at baseline.       Ancillary Information    Past Medical History:  Diagnosis Date   Arthritis    CAD (coronary artery disease)    Centrilobular emphysema (HCC)    Collagen vascular disease    Rhematoid Arthritis   COPD (chronic obstructive pulmonary disease) (HCC)    Coronary artery disease    Dysphagia     Dyspnea    Emphysema lung (HCC)    Grade I diastolic dysfunction    Hearing aid worn    bilateral   Hyperlipidemia    Idiopathic pulmonary fibrosis (HCC)    Interstitial lung disease (HCC)    Mild mitral regurgitation by prior echocardiogram    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HCC)    Rheumatoid arthritis involving multiple sites with positive rheumatoid factor (HCC)    Seborrheic dermatitis of scalp    Sensorineural hearing loss, bilateral    Severe protein-calorie malnutrition      Family History  Problem Relation Age of Onset   Heart disease Mother    Heart attack Mother 91   Arthritis Mother    Heart disease Father    Heart attack Father 3   Healthy Sister    Hyperlipidemia Brother    Throat cancer Brother 60   Heart disease Brother  Healthy Son    Healthy Daughter      Past Surgical History:  Procedure Laterality Date   EXCISION OF TONGUE LESION Bilateral 01/27/2024   Procedure: EXCISION, LESION, TONGUE;  Surgeon: Milissa Hamming, MD;  Location: [facility] ORS;  Service: ENT;  Laterality: Bilateral;   SINUS SURGERY WITH INSTATRAK      Social History   Socioeconomic History   Marital status: Married    Spouse name: Chartered certified accountant   Number of children: Not on file   Years of education: High School   Highest education level: Not on file  Occupational History   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[employer])    Comment: Retired 2012  Tobacco Use   Smoking status: Former    Current packs/day: 0.00    Average packs/day: 1.5 packs/day for 30.0 years (45.0 ttl pk-yrs)    Types: Cigarettes    Start date: 26    Quit date: 2007    Years since quitting: 18.7   Smokeless tobacco: Former    Types: Chew    Quit date: 2007   Tobacco comments:    Dip smokeless tobacco >20-30 years  Vaping Use   Vaping status: Never Used  Substance and Sexual Activity   Alcohol use: Yes    Comment: occ   Drug use: No   Sexual activity: Not Currently  Other Topics Concern   Not on file  Social  History Narrative   Right handed   One story home   Drinks caffeine   Social Drivers of Health   Financial Resource Strain: Low Risk  (03/12/2024)   Overall Financial Resource Strain (CARDIA)    Difficulty of Paying Living Expenses: Not hard at all  Food Insecurity: No Food Insecurity (03/12/2024)   Hunger Vital Sign    Worried About Running Out of Food in the Last Year: Never true    Ran Out of Food in the Last Year: Never true  Transportation Needs: No Transportation Needs (03/12/2024)   PRAPARE - Administrator, Civil Service (Medical): No    Lack of Transportation (Non-Medical): No  Physical Activity: Insufficiently Active (03/12/2024)   Exercise Vital Sign    Days of Exercise per Week: 2 days    Minutes of Exercise per Session: 60 min  Stress: No Stress Concern Present (03/12/2024)   [employer] of Occupational Health - Occupational Stress Questionnaire    Feeling of Stress: Not at all  Social Connections: Moderately Isolated (03/12/2024)   Social Connection and Isolation Panel    Frequency of Communication with Friends and Family: More than three times a week    Frequency of Social Gatherings with Friends and Family: Once a week    Attends Religious Services: Never    Database administrator or Organizations: No    Attends Banker Meetings: Never    Marital Status: Married  Catering manager Violence: Not At Risk (03/12/2024)   Humiliation, Afraid, Rape, and Kick questionnaire    Fear of Current or Ex-Partner: No    Emotionally Abused: No    Physically Abused: No    Sexually Abused: No     Allergies  Allergen Reactions   Atorvastatin  Other (See Comments)    Myalgias     CBC    Component Value Date/Time   WBC 6.2 08/15/2023 0859   RBC 4.73 08/15/2023 0859   HGB 15.8 08/15/2023 0859   HCT 46.2 08/15/2023 0859   PLT 277 08/15/2023 0859   MCV 97.7 08/15/2023  0859   MCH 33.4 (H) 08/15/2023 0859   MCHC 34.2 08/15/2023 0859   RDW 12.8  08/15/2023 0859   LYMPHSABS 0.7 04/30/2022 1215   MONOABS 0.9 04/30/2022 1215   EOSABS 81 08/15/2023 0859   BASOSABS 31 08/15/2023 0859    Pulmonary Functions Testing Results:    Latest Ref Rng & Units 10/21/2022    3:24 PM 04/30/2022    9:04 AM 01/17/2022    3:41 PM  PFT Results  FVC-Pre L 4.01  4.01  4.08   FVC-Predicted Pre % 89  89  86   Pre FEV1/FVC % % 73  81  82   FEV1-Pre L 2.92  3.23  3.33   FEV1-Predicted Pre % 87  97  95   DLCO uncorrected ml/min/mmHg 7.85  10.43  9.05   DLCO UNC% % 29  39  33   DLCO corrected ml/min/mmHg 7.85  10.62  9.24   DLCO COR %Predicted % 29  40  33   DLVA Predicted % 32  49  41     Outpatient Medications Prior to Visit  Medication Sig Dispense Refill   azaTHIOprine  (IMURAN ) 50 MG tablet Take 2 tablets (100 mg total) by mouth daily. 180 tablet 0   HYDROcodone -acetaminophen  (HYCET) 7.5-325 mg/15 ml solution Take 10 mLs by mouth 4 (four) times daily as needed for moderate pain (pain score 4-6). 100 mL 0   hydroxychloroquine (PLAQUENIL) 200 MG tablet Take 1 tablet by mouth daily.     Multiple Vitamins-Minerals (CENTRUM SILVER 50+MEN PO) Take by mouth daily.     Pirfenidone  267 MG TABS Take 2 tablets (534 mg total) by mouth 3 (three) times daily with meals. (Patient taking differently: Take 534 mg by mouth. 2 tablets a week.) 180 tablet 1   No facility-administered medications prior to visit.

## 2024-05-19 NOTE — Telephone Encounter (Addendum)
 Received notification from staff message that pt is restarting pirfenidone . Ran test claim and authorization for [facility] commercial plan is approved until 10/23/24.   BC[employer] Medicare requires pa. Submitted a Prior Authorization request to [facility] for PIRFENIDONE  via CoverMyMeds. Will update once we receive a response.  Key: [id number]

## 2024-05-20 ENCOUNTER — Telehealth: Payer: Self-pay

## 2024-05-20 ENCOUNTER — Other Ambulatory Visit (HOSPITAL_COMMUNITY): Payer: Self-pay

## 2024-05-20 ENCOUNTER — Other Ambulatory Visit: Payer: Self-pay

## 2024-05-20 NOTE — Telephone Encounter (Signed)
 Copied from CRM [id number]. Topic: Clinical - Prescription Issue >> May 20, 2024  8:41 AM Nathanel DEL wrote: Reason for CRM: Malia calling to let you know the med  Pirfenidone  267 MG TABS Has been approved. She will fax as well [phone]  option 5

## 2024-05-20 NOTE — Telephone Encounter (Signed)
 Received a CRM from the patient stating the cost of his medication is high. Called and spoke to the pt's wife who said she was checking to see if the rx for Stiolto was sent. I advised her the pharmacy had it ready but the copay was over $400. Pt has 2 insurances and it was only billed to the medicare, told the pt's wife I would check with the pharmacy and get back with her.  Reached out to Rai who was able to rebill the rx to both insurances and brought it to a $47 copay. I called the wife back to inform her of the copay, she said that will be ok. She had some questions about whether the rx would be mailed and if it's on auto refill, I referred her to call the pharmacy and follow up with them.

## 2024-05-20 NOTE — Telephone Encounter (Signed)
-----   Message from Orange Blossom Dgayli sent at 05/19/2024 11:20 AM EDT ----- Regarding: pirfenidone  Hi all - this patient returned for follow up today and was running out of his pirfenidone . I've re-sent the prescription to our community pharmacy at [place] give you've previously helped him with this. EPIC is showing that a new prior shara is needed - could you kindly help with this as well as with re-initiation and titration.  Thanks Avie Belva November, MD [facility] 05/19/2024 11:21 AM

## 2024-05-20 NOTE — Telephone Encounter (Signed)
 Received notification from [facility] regarding a prior authorization for PIRFENIDONE . Authorization has been APPROVED from 05/19/24 to 05/19/25. Approval letter sent to scan center.  Unable to run test claim because pt must fill [facility].  Authorization # [id number] Phone # [phone]

## 2024-05-21 ENCOUNTER — Other Ambulatory Visit: Payer: Self-pay | Admitting: Student in an Organized Health Care Education/Training Program

## 2024-05-21 ENCOUNTER — Other Ambulatory Visit (HOSPITAL_COMMUNITY): Payer: Self-pay

## 2024-05-21 DIAGNOSIS — J8489 Other specified interstitial pulmonary diseases: Secondary | ICD-10-CM

## 2024-05-24 ENCOUNTER — Telehealth: Payer: Self-pay

## 2024-05-24 DIAGNOSIS — Z5181 Encounter for therapeutic drug level monitoring: Secondary | ICD-10-CM

## 2024-05-24 DIAGNOSIS — J8489 Other specified interstitial pulmonary diseases: Secondary | ICD-10-CM

## 2024-05-24 MED ORDER — PIRFENIDONE 267 MG PO TABS: ORAL_TABLET | ORAL | 0 refills | Status: AC

## 2024-05-24 MED ORDER — PIRFENIDONE 267 MG PO TABS: 534.0000 mg | ORAL_TABLET | Freq: Three times a day (TID) | ORAL | 1 refills | Status: DC

## 2024-05-24 NOTE — Telephone Encounter (Signed)
 Addressed in new encounter - medication restarted and patient counseled on appropriate titration.   Aleck Puls, PharmD, BCPS, CPP Clinical Pharmacist  [facility]

## 2024-05-24 NOTE — Telephone Encounter (Signed)
 Pharmacy team asked to help with reinitiation and titration or pirfenidone  - patient has been running out of pirfenidone . New PA completed (see telephone note 05/19/24).   Per last OV with Dr. Isadora on 05/19/24 Today, we will re-initiate pirfenidone  and attempt to titrate to optimal dose (2 pills twice daily).   Called patient to discuss. He has been using pirfenidone  differently than prescribed for a several months - reports using 1 or 2 tablets total per day. There are several days within a week that he skips pirfenidone  altogether. He reports he was using this pattern of dosing because he was running out of medication.   Reviewed prescribed dosing of Esbriet  (pirfenidone ) with titration instructions: Take 1 tab three times daily for 7 days, then 2 tabs three times daily thereafter.   Patient counseled on proper use and potential adverse effects including nausea, vomiting, abdominal pain, GERD, weight loss, and suns sensitivity/rash.  Stressed the importance of routine lab monitoring. Will monitor LFT's every month for the first 6 months of treatment then every 3 months.   [facility].  Phone # [phone]   Patient instructed to call pharmacy if he does not hear from them by tomorrow to coordinate shipment.   He will have labs drawn in 4 weeks - LFTs ordered.  Lawrence Wells, PharmD, BCPS, CPP Clinical Pharmacist  [facility]

## 2024-05-25 NOTE — Telephone Encounter (Signed)
 Spouse called this morning - they are asking what the copay will be and how to set up mail order pharmacy. Addendum to previous - [facility] phone number: [phone]. Advised to call [facility] to coordinate shipment and learn copay.  Aleck Puls, PharmD, BCPS, CPP Clinical Pharmacist  [facility]

## 2024-05-26 NOTE — Telephone Encounter (Signed)
 Spouse LVM - [facility] not [facility].   Verified that Rx was sent [facility].   Spoke to patient and spouse - they report they are receiving shipment this afternoon.   NFN.   Aleck Puls, PharmD, BCPS, CPP Clinical Pharmacist  [facility]

## 2024-05-28 ENCOUNTER — Ambulatory Visit
Admission: RE | Admit: 2024-05-28 | Discharge: 2024-05-28 | Disposition: A | Discharge status: Home / Self Care | Source: Ambulatory Visit | Attending: Student in an Organized Health Care Education/Training Program | Admitting: Student in an Organized Health Care Education/Training Program

## 2024-05-28 DIAGNOSIS — M359 Systemic involvement of connective tissue, unspecified: Secondary | ICD-10-CM | POA: Insufficient documentation

## 2024-05-28 DIAGNOSIS — J9809 Other diseases of bronchus, not elsewhere classified: Secondary | ICD-10-CM | POA: Diagnosis not present

## 2024-05-28 DIAGNOSIS — J849 Interstitial pulmonary disease, unspecified: Secondary | ICD-10-CM | POA: Diagnosis not present

## 2024-05-28 DIAGNOSIS — J449 Chronic obstructive pulmonary disease, unspecified: Secondary | ICD-10-CM | POA: Insufficient documentation

## 2024-05-28 DIAGNOSIS — J432 Centrilobular emphysema: Secondary | ICD-10-CM | POA: Diagnosis not present

## 2024-05-28 DIAGNOSIS — J8489 Other specified interstitial pulmonary diseases: Secondary | ICD-10-CM | POA: Insufficient documentation

## 2024-05-31 ENCOUNTER — Ambulatory Visit (INDEPENDENT_AMBULATORY_CARE_PROVIDER_SITE_OTHER): Admitting: Family Medicine

## 2024-05-31 ENCOUNTER — Encounter: Payer: Self-pay | Admitting: Family Medicine

## 2024-05-31 ENCOUNTER — Ambulatory Visit: Payer: Self-pay | Admitting: *Deleted

## 2024-05-31 VITALS — BP 124/78 | HR 64 | Temp 98.7°F | Ht 70.0 in | Wt 132.5 lb

## 2024-05-31 DIAGNOSIS — J441 Chronic obstructive pulmonary disease with (acute) exacerbation: Secondary | ICD-10-CM | POA: Diagnosis not present

## 2024-05-31 DIAGNOSIS — R6889 Other general symptoms and signs: Secondary | ICD-10-CM

## 2024-05-31 MED ORDER — PREDNISONE 20 MG PO TABS: ORAL_TABLET | ORAL | 0 refills | Status: DC

## 2024-05-31 MED ORDER — AZITHROMYCIN 250 MG PO TABS: ORAL_TABLET | ORAL | 0 refills | Status: DC

## 2024-05-31 MED ORDER — GUAIFENESIN-CODEINE 100-10 MG/5ML PO SOLN: 5.0000 mL | Freq: Three times a day (TID) | ORAL | 0 refills | Status: DC | PRN

## 2024-05-31 NOTE — Patient Instructions (Addendum)
 Acute copd flare Start prednisone  taper, cough syrup and azithromycin  Zpak  Please schedule a Follow-up Appointment to: Return if symptoms worsen or fail to improve.  If you have any other questions or concerns, please feel free to call the office or send a message through MyChart. You may also schedule an earlier appointment if necessary.  Additionally, you may be receiving a survey about your experience at our office within a few days to 1 week by e-mail or mail. We value your feedback.  Marsa Officer, DO [facility], [place]

## 2024-05-31 NOTE — Progress Notes (Signed)
 Subjective:    Patient ID: Lawrence Wells, male    DOB: 1956/07/09, 68 y.o.   MRN: [id number]  Lawrence Wells is a 68 y.o. male presenting on 05/31/2024 for Cough (About 1 week )  Patient presents for a same day appointment.  HPI  Discussed the use of AI scribe software for clinical note transcription with the patient, who gave verbal consent to proceed.  History of Present Illness   Lawrence Wells is a 68 year old male with COPD who presents with persistent cough and hoarseness.  AECOPD Cough and sputum production Acute worsening of symptoms now similar to prior COPD - Persistent cough, particularly severe at night when lying down - Cough produces green sputum - Similar symptoms occurred about 1-2 months ago - No fever - Using over-the-counter cough medicine in an orange box (name unknown)  Hoarseness - Hoarseness began last week and has progressively worsened  Headache - Splitting headaches, exacerbated by coughing - Using Advil and Tylenol  for headache management  - History of asthma and COPD - Shortness of breath, sometimes becoming short-winded while talking - Last use of prednisone  possibly last year, with multiple prior courses for coughing and breathing issues  Followed by Pulm, recent CT 05/28/24  Forgetfulness - Occasional forgetfulness, including forgetting conversations           03/12/2024   12:46 PM 02/17/2024    8:27 AM 08/15/2023    8:13 AM  Depression screen PHQ 2/9  Decreased Interest 0 0 0  Down, Depressed, Hopeless 0 0 1  PHQ - 2 Score 0 0 1  Altered sleeping 0 0 1  Tired, decreased energy 0 1 0  Change in appetite 0 1 0  Feeling bad or failure about yourself  0 1 1  Trouble concentrating 0 1 0  Moving slowly or fidgety/restless 0 2 0  Suicidal thoughts 0 1 0  PHQ-9 Score 0 7 3  Difficult doing work/chores Not difficult at all Not difficult at all        02/17/2024    8:27 AM 08/15/2023    8:13 AM 04/25/2023    8:09 AM 12/26/2021     4:07 PM  GAD 7 : Generalized Anxiety Score  Nervous, Anxious, on Edge 1 0 0 0  Control/stop worrying 1 0 1 1  Worry too much - different things 1 0 1 1  Trouble relaxing 0 0 0 0  Restless 1 2 0 0  Easily annoyed or irritable 1 1 0 1  Afraid - awful might happen 0 0 0 1  Total GAD 7 Score 5 3 2 4   Anxiety Difficulty Not difficult at all   Somewhat difficult    Social History   Tobacco Use   Smoking status: Former    Current packs/day: 0.00    Average packs/day: 1.5 packs/day for 30.0 years (45.0 ttl pk-yrs)    Types: Cigarettes    Start date: 30    Quit date: 2007    Years since quitting: 18.7   Smokeless tobacco: Former    Types: Chew    Quit date: 2007   Tobacco comments:    Dip smokeless tobacco >20-30 years  Vaping Use   Vaping status: Never Used  Substance Use Topics   Alcohol use: Yes    Comment: occ   Drug use: No    Review of Systems Per HPI unless specifically indicated above     Objective:    BP 124/78 (BP Location:  Right Arm, Patient Position: Sitting, Cuff Size: Normal)   Pulse 64   Temp 98.7 F (37.1 C) (Oral)   Ht 5' 10 (1.778 m)   Wt 132 lb 8 oz (60.1 kg)   SpO2 98%   BMI 19.01 kg/m   Wt Readings from Last 3 Encounters:  05/31/24 132 lb 8 oz (60.1 kg)  05/19/24 135 lb 3.2 oz (61.3 kg)  02/17/24 136 lb 2 oz (61.7 kg)    Physical Exam Vitals and nursing note reviewed.  Constitutional:      General: He is not in acute distress.    Appearance: He is well-developed. He is not diaphoretic.     Comments: Well-appearing, comfortable, cooperative  HENT:     Head: Normocephalic and atraumatic.  Eyes:     General:        Right eye: No discharge.        Left eye: No discharge.     Conjunctiva/sclera: Conjunctivae normal.  Neck:     Thyroid : No thyromegaly.  Cardiovascular:     Rate and Rhythm: Normal rate and regular rhythm.     Pulses: Normal pulses.     Heart sounds: Normal heart sounds. No murmur heard. Pulmonary:     Effort:  Pulmonary effort is normal. No respiratory distress.     Breath sounds: Wheezing present. No rales.     Comments: Reduced air movement, coarse wheezing cough Musculoskeletal:        General: Normal range of motion.     Cervical back: Normal range of motion and neck supple.  Lymphadenopathy:     Cervical: No cervical adenopathy.  Skin:    General: Skin is warm and dry.     Findings: No erythema or rash.  Neurological:     Mental Status: He is alert and oriented to person, place, and time. Mental status is at baseline.  Psychiatric:        Behavior: Behavior normal.     Comments: Well groomed, good eye contact, normal speech and thoughts     I have personally reviewed the radiology report from CT high resolution Chest 05/28/24  CLINICAL DATA:  Rheumatoid arthritis interstitial lung disease.   EXAM: CT CHEST WITHOUT CONTRAST   TECHNIQUE: Multidetector CT imaging of the chest was performed following the standard protocol without intravenous contrast. High resolution imaging of the lungs, as well as inspiratory and expiratory imaging, was performed.   RADIATION DOSE REDUCTION: This exam was performed according to the departmental dose-optimization program which includes automated exposure control, adjustment of the mA and/or kV according to patient size and/or use of iterative reconstruction technique.   COMPARISON:  01/01/2023.   FINDINGS: Cardiovascular: Atherosclerotic calcification of the aorta, aortic valve and coronary arteries. Ascending aorta measures 4.1 cm (coronal image 37), stable. Enlarged pulmonic trunk. Heart is at the upper limits of normal in size to mildly enlarged. No pericardial effusion.   Mediastinum/Nodes: Mediastinal lymph nodes measure up to 12 mm in the low right paratracheal station, unchanged and likely reactive in etiology. Hilar regions are difficult to definitively evaluate without IV contrast. No axillary adenopathy. Esophagus is  grossly unremarkable.   Lungs/Pleura: Peripheral and basilar predominant subpleural reticulation, traction bronchiectasis/bronchiolectasis and extensive honeycombing. Minimal associated ground-glass component, as before. Findings are stable to minimally progressive from 01/01/2023. Centrilobular and paraseptal emphysema. No pleural fluid. Narrowing of the left upper lobe bronchus, similar. Airway is otherwise unremarkable. Expiratory phase imaging was not performed in true expiration, limiting the evaluation for air trapping.  Upper Abdomen: Small left adrenal adenoma. No specific follow-up necessary. Prominent left renal pelvis. Proximal gastric wall thickening, likely unchanged. Visualized portions of the liver, adrenal glands, kidneys, spleen, pancreas, stomach and bowel are otherwise grossly unremarkable. No upper abdominal adenopathy.   Musculoskeletal: Degenerative changes in the spine.   IMPRESSION: 1. Pulmonary parenchymal pattern of interstitial lung disease, as detailed above, similar to minimally progressive from 01/01/2023. Findings are consistent with UIP per consensus guidelines: Diagnosis of Idiopathic Pulmonary Fibrosis: An Official ATS/ERS/JRS/ALAT Clinical Practice Guideline. Am JINNY [employer] Crit Care Med Vol 198, Iss 5, [id number], Apr 19 2017. 2. 4.1 cm ascending aortic aneurysm, stable. Recommend annual imaging followup by CTA or MRA. This recommendation follows 2010 ACCF/AHA/AATS/ACR/ASA/SCA/SCAI/SIR/STS/SVM Guidelines for the Diagnosis and Management of Patients with Thoracic Aortic Disease. Circulation. 2010; 121: Z733-z630. Aortic aneurysm NOS (ICD10-I71.9). 3. Small left adrenal adenoma. 4. Proximal gastric wall thickening, likely unchanged. 5. Aortic atherosclerosis (ICD10-I70.0). Coronary artery calcification. 6. Enlarged pulmonic trunk, indicative of pulmonary arterial hypertension. 7.  Emphysema (ICD10-J43.9).     Electronically Signed   By: Newell Eke M.D.   On: 05/29/2024 13:31  Results for orders placed or performed during the hospital encounter of 05/19/24  Hepatic function panel   Collection Time: 05/19/24 11:42 AM  Result Value Ref Range   Total Protein 7.3 6.5 - 8.1 g/dL   Albumin 4.0 3.5 - 5.0 g/dL   AST 25 15 - 41 U/L   ALT 14 0 - 44 U/L   Alkaline Phosphatase 61 38 - 126 U/L   Total Bilirubin 0.7 0.0 - 1.2 mg/dL   Bilirubin, Direct <9.8 0.0 - 0.2 mg/dL   Indirect Bilirubin NOT CALCULATED 0.3 - 0.9 mg/dL      Assessment & Plan:   Problem List Items Addressed This Visit     COPD with acute exacerbation (HCC) - Primary   Relevant Medications   azithromycin  (ZITHROMAX  Z-PAK) 250 MG tablet   predniSONE  (DELTASONE ) 20 MG tablet   guaiFENesin -codeine (VIRTUSSIN A/C) 100-10 MG/5ML syrup    Acute exacerbation COPD Acute upper respiratory infection with cough and hoarseness Followed by Pulmonology, COPD/ILD Now worsening persistent cough, and green sputum. Likely viral. Negative COVID test. Cough worsening headaches and COPD. - Prescribed Z-Pak for bacterial coverage. - Prescribed codeine cough syrup for cough relief. - Prescribed prednisone  taper for inflammation and respiratory symptoms. Continue maintenance therapy for COPD and f/u with Pulm as planned - Note just completed High Res CT Lung Scan 05/28/24, therefore, we did not offer X-ray today    No orders of the defined types were placed in this encounter.   Meds ordered this encounter  Medications   azithromycin  (ZITHROMAX  Z-PAK) 250 MG tablet    Sig: Take 2 tabs (500mg  total) on Day 1. Take 1 tab (250mg ) daily for next 4 days.    Dispense:  6 tablet    Refill:  0   predniSONE  (DELTASONE ) 20 MG tablet    Sig: Take daily with food. Start with 60mg  (3 pills) x 2 days, then reduce to 40mg  (2 pills) x 2 days, then 20mg  (1 pill) x 3 days    Dispense:  13 tablet    Refill:  0   guaiFENesin -codeine (VIRTUSSIN A/C) 100-10 MG/5ML syrup    Sig: Take 5 mLs by  mouth 3 (three) times daily as needed.    Dispense:  120 mL    Refill:  0    Follow up plan: Return if symptoms worsen or fail to improve.  Marsa Officer, DO [facility] 05/31/2024, 2:17 PM

## 2024-05-31 NOTE — Telephone Encounter (Signed)
 FYI Only or Action Required?: FYI only for provider.  Patient was last seen in primary care on 02/17/2024 by Edman Marsa PARAS, DO.  Called Nurse Triage reporting Cough.  Symptoms began several days ago.  Interventions attempted: OTC medications: delsym not effective .  Symptoms are: gradually worsening.  Triage Disposition: See HCP Within 4 Hours (Or PCP Triage)  Patient/caregiver understands and will follow disposition?: Yes       Copied from CRM #[id number]. Topic: Clinical - Red Word Triage >> May 31, 2024  8:24 AM Avram MATSU wrote: Red Word that prompted transfer to Nurse Triage: pt feels bad, feels like he's dying, coughing,headache, coughing mucous dark green globs. Can't sleep, hot/cold spells. Reason for Disposition  [1] MILD difficulty breathing (e.g., minimal/no SOB at rest, SOB with walking, pulse < 100) AND [2] still present when not coughing  Answer Assessment - Initial Assessment Questions Appt today with PCP. Recommended if sx worsen go to UC/ED.     1. ONSET: When did the cough begin?      3 days ago  2. SEVERITY: How bad is the cough today?      Getting worse 3. SPUTUM: Describe the color of your sputum (e.g., none, dry cough; clear, white, yellow, green)    Green globs  4. HEMOPTYSIS: Are you coughing up any blood? If Yes, ask: How much? (e.g., flecks, streaks, tablespoons, etc.)     No blood  5. DIFFICULTY BREATHING: Are you having difficulty breathing? If Yes, ask: How bad is it? (e.g., mild, moderate, severe)      At times breathing is moderate to severe with coughing  6. FEVER: Do you have a fever? If Yes, ask: What is your temperature, how was it measured, and when did it start?     No coughing  7. CARDIAC HISTORY: Do you have any history of heart disease? (e.g., heart attack, congestive heart failure)      See hx  8. LUNG HISTORY: Do you have any history of lung disease?  (e.g., pulmonary embolus, asthma, emphysema)      Hx pulmonary fibrosis, COPD 9. PE RISK FACTORS: Do you have a history of blood clots? (or: recent major surgery, recent prolonged travel, bedridden)     na 10. OTHER SYMPTOMS: Do you have any other symptoms? (e.g., runny nose, wheezing, chest pain)       Headache , coughing green , runny nose light freight train sneezing , eyes sting loss of appetite  11. PREGNANCY: Is there any chance you are pregnant? When was your last menstrual period?       na 12. TRAVEL: Have you traveled out of the country in the last month? (e.g., travel history, exposures)       na  Protocols used: Cough - Acute Productive-A-AH

## 2024-06-10 ENCOUNTER — Ambulatory Visit: Payer: Self-pay | Admitting: Student in an Organized Health Care Education/Training Program

## 2024-06-10 NOTE — Progress Notes (Signed)
1st attempt- call would not go through

## 2024-06-11 NOTE — Progress Notes (Signed)
 Pt notified of results and scheduled for 08/20/24 at 9am

## 2024-06-15 DIAGNOSIS — M0579 Rheumatoid arthritis with rheumatoid factor of multiple sites without organ or systems involvement: Secondary | ICD-10-CM | POA: Diagnosis not present

## 2024-07-13 DIAGNOSIS — M0579 Rheumatoid arthritis with rheumatoid factor of multiple sites without organ or systems involvement: Secondary | ICD-10-CM | POA: Diagnosis not present

## 2024-07-26 ENCOUNTER — Telehealth: Payer: Self-pay | Admitting: Student in an Organized Health Care Education/Training Program

## 2024-07-26 DIAGNOSIS — M359 Systemic involvement of connective tissue, unspecified: Secondary | ICD-10-CM

## 2024-07-26 NOTE — Telephone Encounter (Signed)
 Copied from CRM [id number]. Topic: Clinical - Medication Refill >> Jul 26, 2024 10:12 AM Rilla B wrote: Medication: Pirfenidone  267 MG TABS  Has the patient contacted their pharmacy? Yes (Agent: If no, request that the patient contact the pharmacy for the refill. If patient does not wish to contact the pharmacy document the reason why and proceed with request.) (Agent: If yes, when and what did the pharmacy advise?)  This is the patient's preferred pharmacy:   [facility] - [place], [place] - [address] [place] [address] Phone: [phone] Fax: [phone]  Is this the correct pharmacy for this prescription? Yes If no, delete pharmacy and type the correct one.   Has the prescription been filled recently? Yes  Is the patient out of the medication? Yes  Has the patient been seen for an appointment in the last year OR does the patient have an upcoming appointment? Yes  Can we respond through MyChart? No  Agent: Please be advised that Rx refills may take up to 3 business days. We ask that you follow-up with your pharmacy.

## 2024-07-28 MED ORDER — PIRFENIDONE 267 MG PO TABS: 534.0000 mg | ORAL_TABLET | Freq: Three times a day (TID) | ORAL | 3 refills | Status: AC

## 2024-07-28 NOTE — Telephone Encounter (Signed)
 Refill sent for ESBRIET  to [facility] (pulmonary fibrosis team): [phone]  Dose: 534mg  by mouth three times daily   Last OV: 05/19/24 Provider: Dr. Isadora  Pertinent labs: LFTs wnl 05/19/24  Next OV: 08/05/24  Aleck Puls, PharmD, BCPS Clinical Pharmacist  [facility]

## 2024-08-05 ENCOUNTER — Encounter: Payer: Self-pay | Admitting: Student in an Organized Health Care Education/Training Program

## 2024-08-05 ENCOUNTER — Ambulatory Visit: Admitting: Student in an Organized Health Care Education/Training Program

## 2024-08-05 VITALS — BP 118/80 | HR 57 | Temp 98.1°F | Ht 70.0 in | Wt 136.0 lb

## 2024-08-05 DIAGNOSIS — Z87891 Personal history of nicotine dependence: Secondary | ICD-10-CM

## 2024-08-05 DIAGNOSIS — J432 Centrilobular emphysema: Secondary | ICD-10-CM

## 2024-08-05 DIAGNOSIS — M069 Rheumatoid arthritis, unspecified: Secondary | ICD-10-CM

## 2024-08-05 DIAGNOSIS — M0579 Rheumatoid arthritis with rheumatoid factor of multiple sites without organ or systems involvement: Secondary | ICD-10-CM

## 2024-08-05 DIAGNOSIS — J8410 Pulmonary fibrosis, unspecified: Secondary | ICD-10-CM

## 2024-08-05 DIAGNOSIS — J449 Chronic obstructive pulmonary disease, unspecified: Secondary | ICD-10-CM

## 2024-08-05 DIAGNOSIS — J849 Interstitial pulmonary disease, unspecified: Secondary | ICD-10-CM | POA: Diagnosis not present

## 2024-08-05 NOTE — Progress Notes (Unsigned)
 Synopsis: Referred in *** by Lawrence Marsa PARAS, DO  Assessment & Plan:   There are no diagnoses linked to this encounter.  Assessment and Plan Assessment & Plan Rheumatoid arthritis-associated interstitial lung disease Stable with occasional cough. Recent prednisone  course. Pirfenidone  refill issue resolved. - Ensured pirfenidone  refill is automatic. - Scheduled pulmonary function test before next visit. - Follow-up in three months.   Return in about 3 months (around 11/03/2024).  Lawrence November, MD [facility] 08/05/2024 9:27 AM   I spent *** minutes caring for this patient today, including {EM billing:28027}  End of visit medications:  No orders of the defined types were placed in this encounter.   Current Medications[1]   Subjective:   PATIENT ID: Lawrence Wells DOB: 09-11-1955, MRN: [id number]  Chief Complaint  Patient presents with   Interstitial Lung Disease    DOE. No wheezing. No cough Pirfenidone - daily. Stiolto- PRN    HPI  Discussed the use of AI scribe software for clinical note transcription with the patient, who gave verbal consent to proceed.  History of Present Illness Lawrence Wells is a 68 year old Wells with rheumatoid arthritis-associated interstitial lung disease who presents for follow-up.  He was started on pirfenidone , taken three times a day, but ran out of the medication two weeks ago.  No significant changes in breathing and feels good overall. He is able to engage in activities such as splitting wood without major issues, although he experiences normal exhaustion from physical activity.  A few months ago, he had a 'real bad, terrible cough' for which his primary care physician prescribed prednisone . He did not have a fever or COVID-19 at that time, and the cough has since resolved.  He experiences an occasional cough and a little queasiness but nothing significant.  He is retired from working  in HOSPITAL DOCTOR for [employer].   Ancillary information including prior medications, full medical/surgical/family/social histories, and PFTs (when available) are listed below and have been reviewed.   {PULM QUESTIONNAIRES (Optional):33196}  ROS   Objective:   Vitals:   08/05/24 0902  BP: 118/80  Pulse: (!) 57  Temp: 98.1 F (36.7 C)  SpO2: 98%  Weight: 136 lb (61.7 kg)  Height: 5' 10 (1.778 m)   98% on *** LPM *** RA BMI Readings from Last 3 Encounters:  08/05/24 19.51 kg/m  05/31/24 19.01 kg/m  05/19/24 19.40 kg/m   Wt Readings from Last 3 Encounters:  08/05/24 136 lb (61.7 kg)  05/31/24 132 lb 8 oz (60.1 kg)  05/19/24 135 lb 3.2 oz (61.3 kg)    .vitalsmbmi  Physical Exam    Ancillary Information    Past Medical History:  Diagnosis Date   Arthritis    CAD (coronary artery disease)    Centrilobular emphysema (HCC)    Collagen vascular disease    Rhematoid Arthritis   COPD (chronic obstructive pulmonary disease) (HCC)    Coronary artery disease    Dysphagia    Dyspnea    Emphysema lung (HCC)    Grade I diastolic dysfunction    Hearing aid worn    bilateral   Hyperlipidemia    Idiopathic pulmonary fibrosis (HCC)    Interstitial lung disease (HCC)    Mild mitral regurgitation by prior echocardiogram    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HCC)    Rheumatoid arthritis involving multiple sites with positive rheumatoid factor (HCC)    Seborrheic dermatitis of scalp    Sensorineural hearing loss, bilateral  Severe protein-calorie malnutrition      Family History  Problem Relation Age of Onset   Heart disease Mother    Heart attack Mother 50   Arthritis Mother    Heart disease Father    Heart attack Father 70   Healthy Sister    Hyperlipidemia Brother    Throat cancer Brother 60   Heart disease Brother    Healthy Son    Healthy Daughter      Past Surgical History:  Procedure Laterality Date   EXCISION OF TONGUE LESION Bilateral 01/27/2024   Procedure: EXCISION,  LESION, TONGUE;  Surgeon: Lawrence Hamming, MD;  Location: [facility] ORS;  Service: ENT;  Laterality: Bilateral;   SINUS SURGERY WITH INSTATRAK      Social History   Socioeconomic History   Marital status: Married    Spouse name: Lawrence Wells   Number of children: Not on file   Years of education: High School   Highest education level: Not on file  Occupational History   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[employer])    Comment: Retired 2012  Tobacco Use   Smoking status: Former    Current packs/day: 0.00    Average packs/day: 1.5 packs/day for 30.0 years (45.0 ttl pk-yrs)    Types: Cigarettes    Start date: 26    Quit date: 2007    Years since quitting: 18.9   Smokeless tobacco: Former    Types: Chew    Quit date: 2007   Tobacco comments:    Dip smokeless tobacco >20-30 years  Vaping Use   Vaping status: Never Used  Substance and Sexual Activity   Alcohol use: Yes    Comment: occ   Drug use: No   Sexual activity: Not Currently  Other Topics Concern   Not on file  Social History Narrative   Right handed   One story home   Drinks caffeine   Social Drivers of Health   Tobacco Use: Medium Risk (08/05/2024)   Patient History    Smoking Tobacco Use: Former    Smokeless Tobacco Use: Former    Passive Exposure: Not on Actuary Strain: Low Risk (03/12/2024)   Overall Financial Resource Strain (CARDIA)    Difficulty of Paying Living Expenses: Not hard at all  Food Insecurity: No Food Insecurity (03/12/2024)   Epic    Worried About Radiation Protection Practitioner of Food in the Last Year: Never true    Ran Out of Food in the Last Year: Never true  Transportation Needs: No Transportation Needs (03/12/2024)   Epic    Lack of Transportation (Medical): No    Lack of Transportation (Non-Medical): No  Physical Activity: Insufficiently Active (03/12/2024)   Exercise Vital Sign    Days of Exercise per Week: 2 days    Minutes of Exercise per Session: 60 min  Stress: No Stress Concern Present  (03/12/2024)   [employer] of Occupational Health - Occupational Stress Questionnaire    Feeling of Stress: Not at all  Social Connections: Moderately Isolated (03/12/2024)   Social Connection and Isolation Panel    Frequency of Communication with Friends and Family: More than three times a week    Frequency of Social Gatherings with Friends and Family: Once a week    Attends Religious Services: Never    Database Administrator or Organizations: No    Attends Banker Meetings: Never    Marital Status: Married  Catering Manager Violence: Not At Risk (03/12/2024)  Epic    Fear of Current or Ex-Partner: No    Emotionally Abused: No    Physically Abused: No    Sexually Abused: No  Depression (PHQ2-9): Low Risk (03/12/2024)   Depression (PHQ2-9)    PHQ-2 Score: 0  Recent Concern: Depression (PHQ2-9) - Medium Risk (02/17/2024)   Depression (PHQ2-9)    PHQ-2 Score: 7  Alcohol Screen: Low Risk (03/12/2024)   Alcohol Screen    Last Alcohol Screening Score (AUDIT): 4  Housing: Unknown (03/12/2024)   Epic    Unable to Pay for Housing in the Last Year: No    Number of Times Moved in the Last Year: Not on file    Homeless in the Last Year: No  Utilities: Not At Risk (03/12/2024)   Epic    Threatened with loss of utilities: No  Health Literacy: Adequate Health Literacy (03/12/2024)   B1300 Health Literacy    Frequency of need for help with medical instructions: Never     Allergies[2]   CBC    Component Value Date/Time   WBC 6.2 08/15/2023 0859   RBC 4.73 08/15/2023 0859   HGB 15.8 08/15/2023 0859   HCT 46.2 08/15/2023 0859   PLT 277 08/15/2023 0859   MCV 97.7 08/15/2023 0859   MCH 33.4 (H) 08/15/2023 0859   MCHC 34.2 08/15/2023 0859   RDW 12.8 08/15/2023 0859   LYMPHSABS 0.7 04/30/2022 1215   MONOABS 0.9 04/30/2022 1215   EOSABS 81 08/15/2023 0859   BASOSABS 31 08/15/2023 0859    Pulmonary Functions Testing Results:    Latest Ref Rng & Units 10/21/2022    3:24  PM 04/30/2022    9:04 AM 01/17/2022    3:41 PM  PFT Results  FVC-Pre L 4.01  4.01  4.08   FVC-Predicted Pre % 89  89  86   Pre FEV1/FVC % % 73  81  82   FEV1-Pre L 2.92  3.23  3.33   FEV1-Predicted Pre % 87  97  95   DLCO uncorrected ml/min/mmHg 7.85  10.43  9.05   DLCO UNC% % 29  39  33   DLCO corrected ml/min/mmHg 7.85  10.62  9.24   DLCO COR %Predicted % 29  40  33   DLVA Predicted % 32  49  41     Outpatient Medications Prior to Visit  Medication Sig Dispense Refill   azaTHIOprine  (IMURAN ) 50 MG tablet Take 2 tablets (100 mg total) by mouth daily. 180 tablet 0   azithromycin  (ZITHROMAX  Z-PAK) 250 MG tablet Take 2 tabs (500mg  total) on Day 1. Take 1 tab (250mg ) daily for next 4 days. 6 tablet 0   guaiFENesin -codeine  (VIRTUSSIN A/C) 100-10 MG/5ML syrup Take 5 mLs by mouth 3 (three) times daily as needed. 120 mL 0   hydroxychloroquine (PLAQUENIL) 200 MG tablet Take 1 tablet by mouth daily.     Multiple Vitamins-Minerals (CENTRUM SILVER 50+MEN PO) Take by mouth daily.     Pirfenidone  267 MG TABS Take 1 tablet (267mg  total) by mouth three times daily with food for 7 days. Then, take 2 tablets (534mg  total) by mouth three times daily with food thereafter. Repeat labs in 1 month. 159 tablet 0   Pirfenidone  267 MG TABS Take 2 tablets (534 mg total) by mouth in the morning, at noon, and at bedtime. 180 tablet 3   predniSONE  (DELTASONE ) 20 MG tablet Take daily with food. Start with 60mg  (3 pills) x 2 days, then reduce to 40mg  (2  pills) x 2 days, then 20mg  (1 pill) x 3 days 13 tablet 0   Tiotropium Bromide-Olodaterol (STIOLTO RESPIMAT ) 2.5-2.5 MCG/ACT AERS Inhale 2 puffs into the lungs daily. 4 g 12   No facility-administered medications prior to visit.      [1]  Current Outpatient Medications:    azaTHIOprine  (IMURAN ) 50 MG tablet, Take 2 tablets (100 mg total) by mouth daily., Disp: 180 tablet, Rfl: 0   azithromycin  (ZITHROMAX  Z-PAK) 250 MG tablet, Take 2 tabs (500mg  total) on Day 1. Take  1 tab (250mg ) daily for next 4 days., Disp: 6 tablet, Rfl: 0   guaiFENesin -codeine  (VIRTUSSIN A/C) 100-10 MG/5ML syrup, Take 5 mLs by mouth 3 (three) times daily as needed., Disp: 120 mL, Rfl: 0   hydroxychloroquine (PLAQUENIL) 200 MG tablet, Take 1 tablet by mouth daily., Disp: , Rfl:    Multiple Vitamins-Minerals (CENTRUM SILVER 50+MEN PO), Take by mouth daily., Disp: , Rfl:    Pirfenidone  267 MG TABS, Take 1 tablet (267mg  total) by mouth three times daily with food for 7 days. Then, take 2 tablets (534mg  total) by mouth three times daily with food thereafter. Repeat labs in 1 month., Disp: 159 tablet, Rfl: 0   Pirfenidone  267 MG TABS, Take 2 tablets (534 mg total) by mouth in the morning, at noon, and at bedtime., Disp: 180 tablet, Rfl: 3   predniSONE  (DELTASONE ) 20 MG tablet, Take daily with food. Start with 60mg  (3 pills) x 2 days, then reduce to 40mg  (2 pills) x 2 days, then 20mg  (1 pill) x 3 days, Disp: 13 tablet, Rfl: 0   Tiotropium Bromide-Olodaterol (STIOLTO RESPIMAT ) 2.5-2.5 MCG/ACT AERS, Inhale 2 puffs into the lungs daily., Disp: 4 g, Rfl: 12 [2]  Allergies Allergen Reactions   Atorvastatin  Other (See Comments)    Myalgias

## 2024-08-05 NOTE — Patient Instructions (Signed)
°  VISIT SUMMARY: You came in today for a follow-up visit regarding your rheumatoid arthritis-associated interstitial lung disease. You mentioned that you ran out of your medication, pirfenidone , two weeks ago but have not experienced any significant changes in your breathing. You also shared that you had a severe cough a few months ago, which has since resolved after taking prednisone . Currently, you feel good overall and can engage in physical activities like splitting wood, although you do experience normal exhaustion.  YOUR PLAN: -RHEUMATOID ARTHRITIS-ASSOCIATED INTERSTITIAL LUNG DISEASE: This condition involves lung disease associated with rheumatoid arthritis, which can cause symptoms like coughing and breathing difficulties. Your condition appears stable with only occasional coughing. We have resolved the issue with your pirfenidone  refill, ensuring it is now automatic. Additionally, we have scheduled a pulmonary function test before your next visit to monitor your lung function.  INSTRUCTIONS: Please follow up in three months for your next appointment. Make sure to complete the pulmonary function test before this visit.       Contains text generated by Abridge.

## 2024-08-06 NOTE — Progress Notes (Signed)
 Lawrence Wells                                          MRN: [id number]   08/06/2024   The [facility] Quality Team Specialist reviewed this patient medical record for the purposes of chart review for care gap closure. The following were reviewed: chart review for care gap closure-colorectal cancer screening.    [facility] Quality Team

## 2024-08-20 ENCOUNTER — Ambulatory Visit: Admitting: Student in an Organized Health Care Education/Training Program

## 2024-08-23 ENCOUNTER — Other Ambulatory Visit

## 2024-08-23 DIAGNOSIS — J849 Interstitial pulmonary disease, unspecified: Secondary | ICD-10-CM

## 2024-08-23 DIAGNOSIS — N401 Enlarged prostate with lower urinary tract symptoms: Secondary | ICD-10-CM

## 2024-08-23 DIAGNOSIS — E7800 Pure hypercholesterolemia, unspecified: Secondary | ICD-10-CM

## 2024-08-23 DIAGNOSIS — R7309 Other abnormal glucose: Secondary | ICD-10-CM

## 2024-08-23 DIAGNOSIS — I700 Atherosclerosis of aorta: Secondary | ICD-10-CM

## 2024-08-23 DIAGNOSIS — Z0000 Encounter for general adult medical examination without abnormal findings: Secondary | ICD-10-CM

## 2024-08-24 LAB — COMPREHENSIVE METABOLIC PANEL WITH GFR
AG Ratio: 1.9 (calc) (ref 1.0–2.5)
ALT: 10 U/L (ref 9–46)
AST: 18 U/L (ref 10–35)
Albumin: 4.5 g/dL (ref 3.6–5.1)
Alkaline phosphatase (APISO): 76 U/L (ref 35–144)
BUN: 20 mg/dL (ref 7–25)
CO2: 26 mmol/L (ref 20–32)
Calcium: 9.8 mg/dL (ref 8.6–10.3)
Chloride: 102 mmol/L (ref 98–110)
Creat: 1.08 mg/dL (ref 0.70–1.28)
Globulin: 2.4 g/dL (ref 1.9–3.7)
Glucose, Bld: 91 mg/dL (ref 65–99)
Potassium: 5.2 mmol/L (ref 3.5–5.3)
Sodium: 138 mmol/L (ref 135–146)
Total Bilirubin: 0.7 mg/dL (ref 0.2–1.2)
Total Protein: 6.9 g/dL (ref 6.1–8.1)
eGFR: 74 mL/min/1.73m2

## 2024-08-24 LAB — HEMOGLOBIN A1C
Hgb A1c MFr Bld: 5.4 %
Mean Plasma Glucose: 108 mg/dL
eAG (mmol/L): 6 mmol/L

## 2024-08-24 LAB — CBC WITH DIFFERENTIAL/PLATELET
Absolute Lymphocytes: 1958 {cells}/uL (ref 850–3900)
Absolute Monocytes: 608 {cells}/uL (ref 200–950)
Basophils Absolute: 51 {cells}/uL (ref 0–200)
Basophils Relative: 0.8 %
Eosinophils Absolute: 122 {cells}/uL (ref 15–500)
Eosinophils Relative: 1.9 %
HCT: 49.5 % (ref 39.4–51.1)
Hemoglobin: 16.5 g/dL (ref 13.2–17.1)
MCH: 32.8 pg (ref 27.0–33.0)
MCHC: 33.3 g/dL (ref 31.6–35.4)
MCV: 98.4 fL (ref 81.4–101.7)
MPV: 10.1 fL (ref 7.5–12.5)
Monocytes Relative: 9.5 %
Neutro Abs: 3661 {cells}/uL (ref 1500–7800)
Neutrophils Relative %: 57.2 %
Platelets: 293 Thousand/uL (ref 140–400)
RBC: 5.03 Million/uL (ref 4.20–5.80)
RDW: 13.1 % (ref 11.0–15.0)
Total Lymphocyte: 30.6 %
WBC: 6.4 Thousand/uL (ref 3.8–10.8)

## 2024-08-24 LAB — LIPID PANEL
Cholesterol: 241 mg/dL — ABNORMAL HIGH
HDL: 91 mg/dL
LDL Cholesterol (Calc): 131 mg/dL — ABNORMAL HIGH
Non-HDL Cholesterol (Calc): 150 mg/dL — ABNORMAL HIGH
Total CHOL/HDL Ratio: 2.6 (calc)
Triglycerides: 91 mg/dL

## 2024-08-24 LAB — PSA: PSA: 0.39 ng/mL

## 2024-08-30 ENCOUNTER — Encounter: Payer: Self-pay | Admitting: Family Medicine

## 2024-08-30 ENCOUNTER — Ambulatory Visit: Admitting: Family Medicine

## 2024-08-30 VITALS — BP 122/70 | HR 52 | Ht 70.0 in | Wt 132.4 lb

## 2024-08-30 DIAGNOSIS — Z0000 Encounter for general adult medical examination without abnormal findings: Secondary | ICD-10-CM | POA: Diagnosis not present

## 2024-08-30 DIAGNOSIS — J8410 Pulmonary fibrosis, unspecified: Secondary | ICD-10-CM

## 2024-08-30 DIAGNOSIS — E7800 Pure hypercholesterolemia, unspecified: Secondary | ICD-10-CM | POA: Diagnosis not present

## 2024-08-30 DIAGNOSIS — L602 Onychogryphosis: Secondary | ICD-10-CM

## 2024-08-30 DIAGNOSIS — J849 Interstitial pulmonary disease, unspecified: Secondary | ICD-10-CM | POA: Diagnosis not present

## 2024-08-30 DIAGNOSIS — L989 Disorder of the skin and subcutaneous tissue, unspecified: Secondary | ICD-10-CM | POA: Diagnosis not present

## 2024-08-30 DIAGNOSIS — L219 Seborrheic dermatitis, unspecified: Secondary | ICD-10-CM

## 2024-08-30 DIAGNOSIS — J432 Centrilobular emphysema: Secondary | ICD-10-CM

## 2024-08-30 DIAGNOSIS — M0579 Rheumatoid arthritis with rheumatoid factor of multiple sites without organ or systems involvement: Secondary | ICD-10-CM

## 2024-08-30 DIAGNOSIS — Z85819 Personal history of malignant neoplasm of unspecified site of lip, oral cavity, and pharynx: Secondary | ICD-10-CM | POA: Diagnosis not present

## 2024-08-30 DIAGNOSIS — L853 Xerosis cutis: Secondary | ICD-10-CM

## 2024-08-30 DIAGNOSIS — R6889 Other general symptoms and signs: Secondary | ICD-10-CM | POA: Insufficient documentation

## 2024-08-30 MED ORDER — TRIAMCINOLONE ACETONIDE 0.1 % EX LOTN: TOPICAL_LOTION | CUTANEOUS | 0 refills | Status: AC

## 2024-08-30 MED ORDER — KETOCONAZOLE 2 % EX SHAM: 1.0000 | MEDICATED_SHAMPOO | CUTANEOUS | 2 refills | Status: AC

## 2024-08-30 NOTE — Patient Instructions (Addendum)
 Thank you for coming to the office today.  Lab results normal except mild elevated cholesterol LDL level.  Use the rx shampoo for scalp and also use the steroid Lotion as needed for scalp up to once a day for 2 weeks.  -------------------------  Referral to Dermatologist  [facility]   [address] [place], [place] [address] Phone: [phone]  [facility] [address] [place], [place] [address] Phone: [phone]  Referral to Podiatry for toenails  [facility] Address: [address] [place], [place] [address] Hours: Open 8AM-5PM Phone: [phone]  Dry skin  = this can flare up and get worse due to a variety of factors (excessive dry skin from bathing/showering, soaps, cold weather / indoor heaters, outdoor exposures).  For baths/showers, limit bathing to every other day if you can (max 1 x daily)  Use a gentle, unscented soap and lukewarm water (hot water is most irritating to skin) Never scrub skin with too much pressure, this causes more irritation. Pat skin dry, then leave it slightly damp. DO NOT scrub it dry.  Apply a daily moisturizer (Vaseline, Eucerin, Aveeno). Continue daily moisturizer every day of the year (even after flare is resolved)   If develops redness, honey colored crust oozing, drainage of pus, bleeding, or redness / swelling, pain, please return for re-evaluation, may have become infected after scratching.    Please schedule a Follow-up Appointment to: Return in about 6 months (around 02/27/2025) for 6 month follow-up Pulm, Derm Rheum updates.  If you have any other questions or concerns, please feel free to call the office or send a message through MyChart. You may also schedule an earlier appointment if necessary.  Additionally, you may be receiving a survey about your experience at our office within a few days to 1 week by e-mail or mail. We value your feedback.  Marsa Officer, DO [facility], [place]

## 2024-08-30 NOTE — Progress Notes (Unsigned)
 "  Subjective:    Patient ID: Lawrence Wells, male    DOB: Mar 10, 1956, 69 y.o.   MRN: [id number]  Lawrence Wells is a 69 y.o. male presenting on 08/30/2024 for Annual Exam   HPI  Discussed the use of AI scribe software for clinical note transcription with the patient, who gave verbal consent to proceed.  History of Present Illness   ***  ***[facility] Dr Milissa, has evaluated his throat and ears. Ear wax flushing. Uses hearing aid AS NEEDED  Cognitive impairment - Frequent forgetfulness and difficulty maintaining train of thought - Forgets what he was talking about mid-conversation - Partner expresses concern regarding memory lapses - Perfect scores on cognitive screening memory quizzes during annual Medicare wellness visits for the past three years   Dizziness and gait instability - Dizziness and sensation of wobbliness, especially when bending over or changing positions quickly - Feels 'real wobbly' and needs to walk around to regain balance - Symptoms are more pronounced in the heat - Stays indoors during the hottest parts of the day to manage symptoms   Postoperative status - oral surgery - Surgery on June 10th to remove a growth under the tongue, initially noticed in March - Growth described as a 'glob of bubble gum' and had increased in size prior to removal - Minimal postoperative soreness - Able to resume normal activities, such as mowing the yard, shortly after surgery   Hearing impairment - Acquired hearing aids in March - Does not use hearing aids frequently as he is often home alone     ILD / Chronic  Followed by [facility]ology He has transferred from [facility] to [facility] Has seen Dr Isadora He is on Pirfenidone , through [facility]  Followed by Rheumatology On Azathioprine  + Hydroxychloroquin Upcoming visit 09/01/24 Dr Tobie [facility] Rheumatology Recent Infusion therapy on 08/10/24 Overall improvement. Considering adjusting medication since not needed as  often ***   Followed by Dr Mevelyn   History of Episodic Migraines No further flares Has Rizatriptan  PRN   Additional updates  *** Seborrheic Dermatitis Scalp and Body       Health Maintenance:   Updated vaccines.   Prevnar 20 vaccine done 04/2023  Considering options for Colon CA Screening, he received a FIT from Insurance but has not done it.  Past Surgical History:  Procedure Laterality Date   EXCISION OF TONGUE LESION Bilateral 01/27/2024   Procedure: EXCISION, LESION, TONGUE;  Surgeon: Milissa Hamming, MD;  Location: [facility] ORS;  Service: ENT;  Laterality: Bilateral;   SINUS SURGERY WITH INSTATRAK      Right Chest Skin Lesion      08/30/2024    9:14 AM 03/12/2024   12:46 PM 02/17/2024    8:27 AM  Depression screen PHQ 2/9  Decreased Interest 2 0 0  Down, Depressed, Hopeless 2 0 0  PHQ - 2 Score 4 0 0  Altered sleeping 3 0 0  Tired, decreased energy 1 0 1  Change in appetite 0 0 1  Feeling bad or failure about yourself  1 0 1  Trouble concentrating 1 0 1  Moving slowly or fidgety/restless 3 0 2  Suicidal thoughts 2 0 1  PHQ-9 Score 15 0  7   Difficult doing work/chores Not difficult at all Not difficult at all Not difficult at all     Data saved with a previous flowsheet row definition       08/30/2024    9:15 AM 02/17/2024    8:27  AM 08/15/2023    8:13 AM 04/25/2023    8:09 AM  GAD 7 : Generalized Anxiety Score  Nervous, Anxious, on Edge 1 1 0 0  Control/stop worrying 1 1 0 1  Worry too much - different things 1 1 0 1  Trouble relaxing 1 0 0 0  Restless 1 1 2  0  Easily annoyed or irritable 2 1 1  0  Afraid - awful might happen 1 0 0 0  Total GAD 7 Score 8 5 3 2   Anxiety Difficulty Not difficult at all Not difficult at all       Past Medical History:  Diagnosis Date   Arthritis    CAD (coronary artery disease)    Centrilobular emphysema (HCC)    Collagen vascular disease    Rhematoid Arthritis   COPD (chronic obstructive pulmonary disease)  (HCC)    Coronary artery disease    Dysphagia    Dyspnea    Emphysema lung (HCC)    Grade I diastolic dysfunction    Hearing aid worn    bilateral   Hyperlipidemia    Idiopathic pulmonary fibrosis (HCC)    Interstitial lung disease (HCC)    Mild mitral regurgitation by prior echocardiogram    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HCC)    Rheumatoid arthritis involving multiple sites with positive rheumatoid factor (HCC)    Seborrheic dermatitis of scalp    Sensorineural hearing loss, bilateral    Severe protein-calorie malnutrition    Past Surgical History:  Procedure Laterality Date   EXCISION OF TONGUE LESION Bilateral 01/27/2024   Procedure: EXCISION, LESION, TONGUE;  Surgeon: Milissa Hamming, MD;  Location: [facility] ORS;  Service: ENT;  Laterality: Bilateral;   SINUS SURGERY WITH INSTATRAK     Social History   Socioeconomic History   Marital status: Married    Spouse name: Chartered Certified Accountant   Number of children: Not on file   Years of education: High School   Highest education level: Not on file  Occupational History   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[employer])    Comment: Retired 2012  Tobacco Use   Smoking status: Former    Current packs/day: 0.00    Average packs/day: 1.5 packs/day for 30.0 years (45.0 ttl pk-yrs)    Types: Cigarettes    Start date: 21    Quit date: 2007    Years since quitting: 19.0   Smokeless tobacco: Former    Types: Chew    Quit date: 2007   Tobacco comments:    Dip smokeless tobacco >20-30 years  Vaping Use   Vaping status: Never Used  Substance and Sexual Activity   Alcohol use: Yes    Comment: occ   Drug use: No   Sexual activity: Not Currently  Other Topics Concern   Not on file  Social History Narrative   Right handed   One story home   Drinks caffeine   Social Drivers of Health   Tobacco Use: Medium Risk (08/30/2024)   Patient History    Smoking Tobacco Use: Former    Smokeless Tobacco Use: Former    Passive Exposure: Not on Surveyor, Minerals Strain: Low Risk (03/12/2024)   Overall Financial Resource Strain (CARDIA)    Difficulty of Paying Living Expenses: Not hard at all  Food Insecurity: No Food Insecurity (03/12/2024)   Epic    Worried About Radiation Protection Practitioner of Food in the Last Year: Never true    Ran Out of Food in the Last Year: Never true  Transportation Needs: No Transportation Needs (03/12/2024)   Epic    Lack of Transportation (Medical): No    Lack of Transportation (Non-Medical): No  Physical Activity: Insufficiently Active (03/12/2024)   Exercise Vital Sign    Days of Exercise per Week: 2 days    Minutes of Exercise per Session: 60 min  Stress: No Stress Concern Present (03/12/2024)   [employer] of Occupational Health - Occupational Stress Questionnaire    Feeling of Stress: Not at all  Social Connections: Moderately Isolated (03/12/2024)   Social Connection and Isolation Panel    Frequency of Communication with Friends and Family: More than three times a week    Frequency of Social Gatherings with Friends and Family: Once a week    Attends Religious Services: Never    Database Administrator or Organizations: No    Attends Banker Meetings: Never    Marital Status: Married  Catering Manager Violence: Not At Risk (03/12/2024)   Epic    Fear of Current or Ex-Partner: No    Emotionally Abused: No    Physically Abused: No    Sexually Abused: No  Depression (PHQ2-9): High Risk (08/30/2024)   Depression (PHQ2-9)    PHQ-2 Score: 15  Alcohol Screen: Low Risk (03/12/2024)   Alcohol Screen    Last Alcohol Screening Score (AUDIT): 4  Housing: Unknown (03/12/2024)   Epic    Unable to Pay for Housing in the Last Year: No    Number of Times Moved in the Last Year: Not on file    Homeless in the Last Year: No  Utilities: Not At Risk (03/12/2024)   Epic    Threatened with loss of utilities: No  Health Literacy: Adequate Health Literacy (03/12/2024)   B1300 Health Literacy    Frequency of need for  help with medical instructions: Never   Family History  Problem Relation Age of Onset   Heart disease Mother    Heart attack Mother 13   Arthritis Mother    Heart disease Father    Heart attack Father 67   Healthy Sister    Hyperlipidemia Brother    Throat cancer Brother 60   Heart disease Brother    Healthy Son    Healthy Daughter    Current Outpatient Medications on File Prior to Visit  Medication Sig   azaTHIOprine  (IMURAN ) 50 MG tablet Take 2 tablets (100 mg total) by mouth daily.   hydroxychloroquine (PLAQUENIL) 200 MG tablet Take 1 tablet by mouth daily.   Multiple Vitamins-Minerals (CENTRUM SILVER 50+MEN PO) Take by mouth daily.   Pirfenidone  267 MG TABS Take 1 tablet (267mg  total) by mouth three times daily with food for 7 days. Then, take 2 tablets (534mg  total) by mouth three times daily with food thereafter. Repeat labs in 1 month.   Pirfenidone  267 MG TABS Take 2 tablets (534 mg total) by mouth in the morning, at noon, and at bedtime.   Tiotropium Bromide-Olodaterol (STIOLTO RESPIMAT ) 2.5-2.5 MCG/ACT AERS Inhale 2 puffs into the lungs daily.   No current facility-administered medications on file prior to visit.    Review of Systems  Constitutional:  Negative for activity change, appetite change, chills, diaphoresis, fatigue and fever.  HENT:  Negative for congestion and hearing loss.   Eyes:  Negative for visual disturbance.  Respiratory:  Negative for cough, chest tightness, shortness of breath and wheezing.   Cardiovascular:  Negative for chest pain, palpitations and leg swelling.  Gastrointestinal:  Negative for abdominal pain,  constipation, diarrhea, nausea and vomiting.  Genitourinary:  Negative for dysuria, frequency and hematuria.  Musculoskeletal:  Negative for arthralgias and neck pain.  Skin:  Negative for rash.  Neurological:  Negative for dizziness, weakness, light-headedness, numbness and headaches.  Hematological:  Negative for adenopathy.   Psychiatric/Behavioral:  Negative for behavioral problems, dysphoric mood and sleep disturbance.    Per HPI unless specifically indicated above     Objective:    BP 122/70 (BP Location: Right Arm, Patient Position: Sitting, Cuff Size: Normal)   Pulse (!) 52   Ht 5' 10 (1.778 m)   Wt 132 lb 6 oz (60 kg)   SpO2 99%   BMI 18.99 kg/m   Wt Readings from Last 3 Encounters:  08/30/24 132 lb 6 oz (60 kg)  08/05/24 136 lb (61.7 kg)  05/31/24 132 lb 8 oz (60.1 kg)    Physical Exam Vitals and nursing note reviewed.  Constitutional:      General: He is not in acute distress.    Appearance: He is well-developed. He is not diaphoretic.     Comments: Well-appearing, comfortable, cooperative  HENT:     Head: Normocephalic and atraumatic.  Eyes:     General:        Right eye: No discharge.        Left eye: No discharge.     Conjunctiva/sclera: Conjunctivae normal.     Pupils: Pupils are equal, round, and reactive to light.  Neck:     Thyroid : No thyromegaly.  Cardiovascular:     Rate and Rhythm: Normal rate and regular rhythm.     Pulses: Normal pulses.     Heart sounds: Normal heart sounds. No murmur heard. Pulmonary:     Effort: Pulmonary effort is normal. No respiratory distress.     Breath sounds: Normal breath sounds. No wheezing or rales.  Abdominal:     General: Bowel sounds are normal. There is no distension.     Palpations: Abdomen is soft. There is no mass.     Tenderness: There is no abdominal tenderness.  Musculoskeletal:        General: No tenderness. Normal range of motion.     Cervical back: Normal range of motion and neck supple.     Right lower leg: No edema.     Left lower leg: No edema.     Comments: Upper / Lower Extremities: - Normal muscle tone, strength bilateral upper extremities 5/5, lower extremities 5/5  Lymphadenopathy:     Cervical: No cervical adenopathy.  Skin:    General: Skin is warm and dry.     Findings: Lesion (R anterior chest, see  photo. pigmented lesion unchanged 1 year) present. No erythema or rash.     Comments: Dry skin dermatitis  Neurological:     Mental Status: He is alert and oriented to person, place, and time.     Comments: Distal sensation intact to light touch all extremities  Psychiatric:        Mood and Affect: Mood normal.        Behavior: Behavior normal.        Thought Content: Thought content normal.     Comments: Well groomed, good eye contact, normal speech and thoughts     Results for orders placed or performed in visit on 08/23/24  Comprehensive metabolic panel with GFR   Collection Time: 08/23/24  8:39 AM  Result Value Ref Range   Glucose, Bld 91 65 - 99 mg/dL   BUN  20 7 - 25 mg/dL   Creat 8.91 9.29 - 8.71 mg/dL   eGFR 74 > OR = 60 fO/fpw/8.26f7   BUN/Creatinine Ratio SEE NOTE: 6 - 22 (calc)   Sodium 138 135 - 146 mmol/L   Potassium 5.2 3.5 - 5.3 mmol/L   Chloride 102 98 - 110 mmol/L   CO2 26 20 - 32 mmol/L   Calcium  9.8 8.6 - 10.3 mg/dL   Total Protein 6.9 6.1 - 8.1 g/dL   Albumin 4.5 3.6 - 5.1 g/dL   Globulin 2.4 1.9 - 3.7 g/dL (calc)   AG Ratio 1.9 1.0 - 2.5 (calc)   Total Bilirubin 0.7 0.2 - 1.2 mg/dL   Alkaline phosphatase (APISO) 76 35 - 144 U/L   AST 18 10 - 35 U/L   ALT 10 9 - 46 U/L  PSA   Collection Time: 08/23/24  8:39 AM  Result Value Ref Range   PSA 0.39 < OR = 4.00 ng/mL  CBC with Differential/Platelet   Collection Time: 08/23/24  8:39 AM  Result Value Ref Range   WBC 6.4 3.8 - 10.8 Thousand/uL   RBC 5.03 4.20 - 5.80 Million/uL   Hemoglobin 16.5 13.2 - 17.1 g/dL   HCT 50.4 60.5 - 48.8 %   MCV 98.4 81.4 - 101.7 fL   MCH 32.8 27.0 - 33.0 pg   MCHC 33.3 31.6 - 35.4 g/dL   RDW 86.8 88.9 - 84.9 %   Platelets 293 140 - 400 Thousand/uL   MPV 10.1 7.5 - 12.5 fL   Neutro Abs 3,661 1,500 - 7,800 cells/uL   Absolute Lymphocytes 1,958 850 - 3,900 cells/uL   Absolute Monocytes 608 200 - 950 cells/uL   Eosinophils Absolute 122 15 - 500 cells/uL   Basophils Absolute  51 0 - 200 cells/uL   Neutrophils Relative % 57.2 %   Total Lymphocyte 30.6 %   Monocytes Relative 9.5 %   Eosinophils Relative 1.9 %   Basophils Relative 0.8 %  Hemoglobin A1c   Collection Time: 08/23/24  8:39 AM  Result Value Ref Range   Hgb A1c MFr Bld 5.4 <5.7 %   Mean Plasma Glucose 108 mg/dL   eAG (mmol/L) 6.0 mmol/L  Lipid panel   Collection Time: 08/23/24  8:39 AM  Result Value Ref Range   Cholesterol 241 (H) <200 mg/dL   HDL 91 > OR = 40 mg/dL   Triglycerides 91 <849 mg/dL   LDL Cholesterol (Calc) 131 (H) mg/dL (calc)   Total CHOL/HDL Ratio 2.6 <5.0 (calc)   Non-HDL Cholesterol (Calc) 150 (H) <130 mg/dL (calc)      Assessment & Plan:   Problem List Items Addressed This Visit     Centrilobular emphysema (HCC)   Forgetfulness   History of oropharyngeal cancer   Hypercholesterolemia   ILD (interstitial lung disease) (HCC)   Pulmonary fibrosis (HCC)   Rheumatoid arthritis involving multiple sites with positive rheumatoid factor (HCC)   Other Visit Diagnoses       Annual physical exam    -  Primary     Thickening of toenail       Relevant Orders   Ambulatory referral to Podiatry     Seborrheic dermatitis of scalp       Relevant Medications   triamcinolone  lotion (KENALOG ) 0.1 %   ketoconazole  (NIZORAL ) 2 % shampoo   Other Relevant Orders   Ambulatory referral to Dermatology     Skin lesion of chest wall       Relevant  Orders   Ambulatory referral to Dermatology     Xerosis of skin       Relevant Orders   Ambulatory referral to Dermatology        Updated Health Maintenance information Reviewed recent lab results with patient Encouraged improvement to lifestyle with diet and exercise Goal of weight loss  Assessment and Plan Assessment & Plan      Orders Placed This Encounter  Procedures   Ambulatory referral to Podiatry    Referral Priority:   Routine    Referral Type:   Consultation    Referral Reason:   Specialty Services Required     Requested Specialty:   Podiatry    Number of Visits Requested:   1   Ambulatory referral to Dermatology    Referral Priority:   Routine    Referral Type:   Consultation    Referral Reason:   Specialty Services Required    Requested Specialty:   Dermatology    Number of Visits Requested:   1    Meds ordered this encounter  Medications   triamcinolone  lotion (KENALOG ) 0.1 %    Sig: Apply to scalp once daily for up to 2 weeks at a time. May repeat if need.    Dispense:  60 mL    Refill:  0   ketoconazole  (NIZORAL ) 2 % shampoo    Sig: Apply 1 Application topically 2 (two) times a week. For scalp. Until resolved    Dispense:  120 mL    Refill:  2     Follow up plan: Return in about 6 months (around 02/27/2025) for 6 month follow-up Pulm, Derm Rheum updates.  Marsa Officer, DO [facility] 08/30/2024, 9:22 AM  "

## 2024-09-06 ENCOUNTER — Ambulatory Visit

## 2024-09-07 ENCOUNTER — Ambulatory Visit: Admitting: Podiatry

## 2024-09-07 ENCOUNTER — Encounter: Payer: Self-pay | Admitting: Podiatry

## 2024-09-07 VITALS — Ht 70.0 in | Wt 132.4 lb

## 2024-09-07 DIAGNOSIS — M79675 Pain in left toe(s): Secondary | ICD-10-CM

## 2024-09-07 DIAGNOSIS — M79674 Pain in right toe(s): Secondary | ICD-10-CM | POA: Diagnosis not present

## 2024-09-07 DIAGNOSIS — B351 Tinea unguium: Secondary | ICD-10-CM

## 2024-09-07 NOTE — Progress Notes (Signed)
" ° °  Chief Complaint  Patient presents with   Nail Problem    Pt is here due to thick toenail nails would like to have them cut down.    SUBJECTIVE Patient presents to office today complaining of elongated, thickened nails that cause pain while ambulating in shoes.  Patient is unable to trim their own nails. Patient is here for further evaluation and treatment.  Past Medical History:  Diagnosis Date   Arthritis    CAD (coronary artery disease)    Centrilobular emphysema (HCC)    Collagen vascular disease    Rhematoid Arthritis   COPD (chronic obstructive pulmonary disease) (HCC)    Coronary artery disease    Dysphagia    Dyspnea    Emphysema lung (HCC)    Grade I diastolic dysfunction    Hearing aid worn    bilateral   Hyperlipidemia    Idiopathic pulmonary fibrosis (HCC)    Interstitial lung disease (HCC)    Mild mitral regurgitation by prior echocardiogram    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HCC)    Rheumatoid arthritis involving multiple sites with positive rheumatoid factor (HCC)    Seborrheic dermatitis of scalp    Sensorineural hearing loss, bilateral    Severe protein-calorie malnutrition     Allergies[1]   OBJECTIVE General Patient is awake, alert, and oriented x 3 and in no acute distress. Derm Skin is dry and supple bilateral. Negative open lesions or macerations. Remaining integument unremarkable. Nails are tender, long, thickened and dystrophic with subungual debris, consistent with onychomycosis, 1-5 bilateral. No signs of infection noted. Vasc  DP and PT pedal pulses palpable bilaterally. Temperature gradient within normal limits.  Neuro Epicritic and protective threshold sensation grossly intact bilaterally.  Musculoskeletal Exam No symptomatic pedal deformities noted bilateral. Muscular strength within normal limits.  ASSESSMENT 1.  Pain due to onychomycosis of toenails both  PLAN OF CARE 1. Patient evaluated today.  2. Instructed to maintain good pedal hygiene  and foot care.  3. Mechanical debridement of nails 1-5 bilaterally performed using a nail nipper. Filed with dremel without incident.  4. Return to clinic PRN   Thresa EMERSON Sar, DPM [facility]  Dr. Thresa EMERSON Sar, DPM    [address] [place], [place] [address]                Office [phone]  Fax [phone]        [1]  Allergies Allergen Reactions   Atorvastatin  Other (See Comments)    Myalgias   "

## 2024-09-08 ENCOUNTER — Ambulatory Visit

## 2024-09-08 DIAGNOSIS — D229 Melanocytic nevi, unspecified: Secondary | ICD-10-CM

## 2024-09-08 DIAGNOSIS — L578 Other skin changes due to chronic exposure to nonionizing radiation: Secondary | ICD-10-CM

## 2024-09-08 DIAGNOSIS — C4492 Squamous cell carcinoma of skin, unspecified: Secondary | ICD-10-CM

## 2024-09-08 DIAGNOSIS — L570 Actinic keratosis: Secondary | ICD-10-CM | POA: Diagnosis not present

## 2024-09-08 DIAGNOSIS — W908XXA Exposure to other nonionizing radiation, initial encounter: Secondary | ICD-10-CM

## 2024-09-08 DIAGNOSIS — L853 Xerosis cutis: Secondary | ICD-10-CM

## 2024-09-08 DIAGNOSIS — D1801 Hemangioma of skin and subcutaneous tissue: Secondary | ICD-10-CM

## 2024-09-08 DIAGNOSIS — L814 Other melanin hyperpigmentation: Secondary | ICD-10-CM

## 2024-09-08 DIAGNOSIS — Z1283 Encounter for screening for malignant neoplasm of skin: Secondary | ICD-10-CM

## 2024-09-08 DIAGNOSIS — L821 Other seborrheic keratosis: Secondary | ICD-10-CM

## 2024-09-08 DIAGNOSIS — L209 Atopic dermatitis, unspecified: Secondary | ICD-10-CM | POA: Diagnosis not present

## 2024-09-08 DIAGNOSIS — D489 Neoplasm of uncertain behavior, unspecified: Secondary | ICD-10-CM | POA: Diagnosis not present

## 2024-09-08 DIAGNOSIS — D045 Carcinoma in situ of skin of trunk: Secondary | ICD-10-CM | POA: Diagnosis not present

## 2024-09-08 DIAGNOSIS — L2084 Intrinsic (allergic) eczema: Secondary | ICD-10-CM

## 2024-09-08 HISTORY — DX: Squamous cell carcinoma of skin, unspecified: C44.92

## 2024-09-08 MED ORDER — TRIAMCINOLONE ACETONIDE 0.1 % EX OINT: TOPICAL_OINTMENT | CUTANEOUS | 2 refills | Status: AC

## 2024-09-08 MED ORDER — CLOBETASOL PROPIONATE 0.05 % EX OINT: TOPICAL_OINTMENT | CUTANEOUS | 5 refills | Status: AC

## 2024-09-08 NOTE — Patient Instructions (Addendum)
 Gentle Skin Care Guide  1. Bathe no more than once a day.  2. Avoid bathing in hot water  3. Use a mild soap like Dove, Vanicream, Cetaphil, CeraVe. Can use Lever 2000 or Cetaphil antibacterial soap  4. Use soap only where you need it. On most days, use it under your arms, between your legs, and on your feet. Let the water rinse other areas unless visibly dirty.  5. When you get out of the bath/shower, use a towel to gently blot your skin dry, don't rub it.  6. While your skin is still a little damp, apply a moisturizing cream such as Vanicream, CeraVe, Cetaphil, Eucerin, Sarna lotion or plain Vaseline Jelly. For hands apply Neutrogena Norwegian Hand Cream or Excipial Hand Cream.  7. Reapply moisturizer any time you start to itch or feel dry.  8. Sometimes using free and clear laundry detergents can be helpful. Fabric softener sheets should be avoided. Downy Free & Gentle liquid, or any liquid fabric softener that is free of dyes and perfumes, it acceptable to use  9. If your doctor has given you prescription creams you may apply moisturizers over them   Moisturizer: Apply a moisturizer throughout the day and after bathing.  When you moisturize after bathing, this locks in the moisture.  This can lead to softer and smoother skin.  Body moisturizers come in ointments, creams, and lotions.  If you have dry skin, we recommend the use of ointments or creams rather than lotions.  In other words, something you scoop out of a jar rather than squirted out.  Ointments and creams are thicker and thus provide better moisturization.      Moisturizers Apply a moisturizer to your skin at least once a day (even if you do not bathe).   Cool moisturizers on your skin help with itching (to accomplish this, place your moisturizers and medicated creams in the refrigerator). In general, people with dry skin need a moisturizer that is scooped and not squirted.  - Ointments (petrolatum ointment): greasy, but  are the best moisturizers Vaseline, Aquaphor  - Creams (thick, white cream that comes in a jar and is scooped with your hand) Cerave, Cetaphil, Eucerin, Vanicream  - Lotions (comes in a pump) is the weakest moisturizer but is an acceptable choice for the face if you have an oily face Cetaphil, Cerave, Curel, Neutrogena, Lubriderm, Aveeno   Sunscreen  Who needs sunscreen? Everyone. Sunscreen use can help prevent skin cancer by protecting you from the sun's harmful ultraviolet rays. Anyone can get skin cancer, regardless of age, gender or race. In fact, it is estimated that one in five Americans will develop skin cancer in their lifetime.  Sunscreen alone cannot fully protect you. In addition to wearing sunscreen, dermatologists recommend taking the following steps to protect your skin and find skin cancer early:  Seek shade when appropriate, remembering that the sun's rays are strongest between 10 a.m. and 2 p.m. If your shadow is shorter than you are, seek shade. Dress to protect yourself from the sun by wearing a lightweight long-sleeved shirt, pants, a wide-brimmed hat and sunglasses, when possible.  Use extra caution near water, snow and sand as they reflect the damaging rays of the sun, which can increase your chance of sunburn.  Get vitamin D safely through a healthy diet that may include vitamin supplements. Don't seek the sun. Avoid tanning beds. Ultraviolet light from the sun and tanning beds can cause skin cancer and wrinkling. If you want to  look tan, you may wish to use a self-tanning product, but continue to use sunscreen with it.  When should I use sunscreen? Every day you go outside--even if you're just walking to and from your form of transportation. The sun emits harmful UV rays year-round. Even on cloudy days, up to 80 percent of the sun's harmful UV rays can penetrate your skin. Snow, sand and water increase the need for sunscreen because they reflect the sun's rays.  How  much sunscreen should I use, and how often should I apply it? Most people only apply 25-50 percent of the recommended amount of sunscreen. Apply enough sunscreen to cover all exposed skin. Most adults need about 1 ounce -- or enough to fill a shot glass -- to fully cover their body.  Don't forget to apply to the tops of your feet, your neck, your ears and the top of your head. Apply sunscreen to dry skin 15 minutes before going outdoors.  Skin cancer also can form on the lips. To protect your lips, apply a lip balm or lipstick that contains sunscreen with an SPF of 30 or higher.  When outdoors, reapply sunscreen approximately every two hours, or after swimming or sweating, according to the directions on the bottle.   Broad-spectrum sunscreens protect against both UVA and UVB rays. What is the difference between the rays? Sunlight consists of two types of harmful rays that reach the earth -- UVA rays and UVB rays. Overexposure to either can lead to skin cancer. In addition to causing skin cancer, here's what each of these rays do:  UVA rays (or aging rays) can prematurely age your skin, causing wrinkles and age spots, and can pass through window glass. UVB rays (or burning rays) are the primary cause of sunburn and are blocked by window glass  There is no safe way to tan. Every time you tan, you damage your skin. As this damage builds, you speed up the aging of your skin and increase your risk for all types of skin cancer.  What is the difference between chemical and physical sunscreens? Chemical sunscreens work like a sponge, absorbing the sun's rays. They contain one or more of the following active ingredients: oxybenzone, avobenzone, octisalate, octocrylene, homosalate and octinoxate. These formulations tend to be easier to rub into the skin without leaving a white residue.   Physical sunscreens work like a shield, sitting sit on the surface of your skin and deflecting the sun's rays. They contain  the active ingredients zinc  oxide and/or titanium dioxide. Use this sunscreen if you have sensitive skin.   What type of sunscreen should I use? The best type of sunscreen is the one you will use again and again. Just make sure it offers broad-spectrum (UVA and UVB) protection, has an SPF of 30+, and is water-resistant. The kind of sunscreen you use is a matter of personal choice, and may vary depending on the area of the body to be protected. Available sunscreen options include lotions, creams, gels, ointments, wax sticks and sprays.  Recommended physical sunscreens for face: - Neutrogena Sheer Zinc  - Aveeno Positively Mineral Sensitive - CeraVe Hydrating Mineral (also has a tinted version) - La Roche-Posay Anthelios Mineral Face (comes as a cream, lotion, light fluid, and there is also a tinted version).  - EltaMD UV Clear (also has a tinted version)  Recommended physical sunscreens for body: - Neutrogena Sheer Zinc  Dry-Touch Sunscreen Sensitive Skin Lotion Broad Spectrum SPF 50 - Aveeno Positively Mineral Sensitive Skin Sunscreen  Broad Spectrum SPF 50 - La Roche-Posay Anthelios SPF 50 Mineral Sunscreen - Gentle Lotion - CeraVe Hydrating Mineral Sunscreen SPF 50  Recommended chemical sunscreens for face: - Anthelios UV Correct Face Sunscreen SPF 70 with Niacinamide - Neutrogena Clear Face Oil-Free SPF 50 with Helioplex - Neutrogena Sport Face Oil-Free SPF 70+ with Helioplex - Aveeno Protect + Hydrate Sunscreen For Face SPF 70 - La Roche-Posay Anthelios Light Fluid Sunscreen for Face SPF 60  Recommended chemical sunscreens for body: - Neutrogena Ultra Sheer Dry-Touch Sunscreen SPF 70 - Aveeno Protect + Hydrate Broad Spectrum All-Day Hydration SPF 60 (comes in a big pump) - La Roche-Posay Anthelios Melt-In Milk Sunscreen SPF 60  Melanoma ABCDEs  Melanoma is the most dangerous type of skin cancer, and is the leading cause of death from skin disease.  You are more likely to develop  melanoma if you: Have light-colored skin, light-colored eyes, or red or blond hair Spend a lot of time in the sun Tan regularly, either outdoors or in a tanning bed Have had blistering sunburns, especially during childhood Have a close family member who has had a melanoma Have atypical moles or large birthmarks  Early detection of melanoma is key since treatment is typically straightforward and cure rates are extremely high if we catch it early.   The first sign of melanoma is often a change in a mole or a new dark spot.  The ABCDE system is a way of remembering the signs of melanoma.  A for asymmetry:  The two halves do not match. B for border:  The edges of the growth are irregular. C for color:  A mixture of colors are present instead of an even brown color. D for diameter:  Melanomas are usually (but not always) greater than 6mm - the size of a pencil eraser. E for evolution:  The spot keeps changing in size, shape, and color.  Please check your skin once per month between visits. You can use a small mirror in front and a large mirror behind you to keep an eye on the back side or your body.   If you see any new or changing lesions before your next follow-up, please call to schedule a visit.  Please continue daily skin protection including broad spectrum sunscreen SPF 30+ to sun-exposed areas, reapplying every 2 hours as needed when you're outdoors.    Due to recent changes in healthcare laws, you may see results of your pathology and/or laboratory studies on MyChart before the doctors have had a chance to review them. We understand that in some cases there may be results that are confusing or concerning to you. Please understand that not all results are received at the same time and often the doctors may need to interpret multiple results in order to provide you with the best plan of care or course of treatment. Therefore, we ask that you please give us  2 business days to thoroughly  review all your results before contacting the office for clarification. Should we see a critical lab result, you will be contacted sooner.   If You Need Anything After Your Visit  If you have any questions or concerns for your doctor, please call our main line at [phone] and press option 4 to reach your doctor's medical assistant. If no one answers, please leave a voicemail as directed and we will return your call as soon as possible. Messages left after 4 pm will be answered the following business day.   You may  also send us  a message via MyChart. We typically respond to MyChart messages within 1-2 business days.  For prescription refills, please ask your pharmacy to contact our office. Our fax number is [phone].  If you have an urgent issue when the clinic is closed that cannot wait until the next business day, you can page your doctor at the number below.    Please note that while we do our best to be available for urgent issues outside of office hours, we are not available 24/7.   If you have an urgent issue and are unable to reach us , you may choose to seek medical care at your doctor's office, retail clinic, urgent care center, or emergency room.  If you have a medical emergency, please immediately call 911 or go to the emergency department.  Pager Numbers  - Dr. Hester: [phone]  - Dr. Jackquline: [phone]  - Dr. Claudene: [phone]   - Dr. Raymund: [phone]  In the event of inclement weather, please call our main line at [phone] for an update on the status of any delays or closures.  Dermatology Medication Tips: Please keep the boxes that topical medications come in in order to help keep track of the instructions about where and how to use these. Pharmacies typically print the medication instructions only on the boxes and not directly on the medication tubes.   If your medication is too expensive, please contact our office at [phone] option 4 or  send us  a message through MyChart.   We are unable to tell what your co-pay for medications will be in advance as this is different depending on your insurance coverage. However, we may be able to find a substitute medication at lower cost or fill out paperwork to get insurance to cover a needed medication.   If a prior authorization is required to get your medication covered by your insurance company, please allow us  1-2 business days to complete this process.  Drug prices often vary depending on where the prescription is filled and some pharmacies may offer cheaper prices.  The website www.goodrx.com contains coupons for medications through different pharmacies. The prices here do not account for what the cost may be with help from insurance (it may be cheaper with your insurance), but the website can give you the price if you did not use any insurance.  - You can print the associated coupon and take it with your prescription to the pharmacy.  - You may also stop by our office during regular business hours and pick up a GoodRx coupon card.  - If you need your prescription sent electronically to a different pharmacy, notify our office through [facility] or by phone at [phone] option 4.     Si Usted Necesita Algo Despus de Su Visita  Tambin puede enviarnos un mensaje a travs de Clinical Cytogeneticist. Por lo general respondemos a los mensajes de MyChart en el transcurso de 1 a 2 das hbiles.  Para renovar recetas, por favor pida a su farmacia que se ponga en contacto con nuestra oficina. Randi lakes de fax es [place] [phone].  Si tiene un asunto urgente cuando la clnica est cerrada y que no puede esperar hasta el siguiente da hbil, puede llamar/localizar a su doctor(a) al nmero que aparece a continuacin.   Por favor, tenga en cuenta que aunque hacemos todo lo posible para estar disponibles para asuntos urgentes fuera del horario de [place], no estamos disponibles las 24 horas del  da, los [address]  de la semana.   Si tiene un problema urgente y no puede comunicarse con nosotros, puede optar por buscar atencin mdica  en el consultorio de su doctor(a), en una clnica privada, en un centro de atencin urgente o en una sala de emergencias.  Si tiene engineer, drilling, por favor llame inmediatamente al 911 o vaya a la sala de emergencias.  Nmeros de bper  - Dr. Hester: [phone]  - Dra. Jackquline: [phone]  - Dr. Claudene: [phone]  - Dra. Kitts: [phone]  En caso de inclemencias del [place], por favor llame a nuestra lnea principal al [phone] para una actualizacin sobre el estado de cualquier retraso o cierre.  Consejos para la medicacin en dermatologa: Por favor, guarde las cajas en las que vienen los medicamentos de uso tpico para ayudarle a seguir las instrucciones sobre dnde y cmo usarlos. Las farmacias generalmente imprimen las instrucciones del medicamento slo en las cajas y no directamente en los tubos del [place].   Si su medicamento es muy caro, por favor, pngase en contacto con landry rieger llamando al [phone] y presione la opcin 4 o envenos un mensaje a travs de Clinical Cytogeneticist.   No podemos decirle cul ser su copago por los medicamentos por adelantado ya que esto es diferente dependiendo de la cobertura de su seguro. Sin embargo, es posible que podamos encontrar un medicamento sustituto a audiological scientist un formulario para que el seguro cubra el medicamento que se considera necesario.   Si se requiere una autorizacin previa para que su compaa de seguros cubra su medicamento, por favor permtanos de 1 a 2 das hbiles para completar este proceso.  Los precios de los medicamentos varan con frecuencia dependiendo del environmental consultant de dnde se surte la receta y alguna farmacias pueden ofrecer precios ms baratos.  El sitio web www.goodrx.com tiene cupones para medicamentos de health and safety inspector. Los precios aqu no  tienen en cuenta lo que podra costar con la ayuda del seguro (puede ser ms barato con su seguro), pero el sitio web puede darle el precio si no utiliz tourist information centre manager.  - Puede imprimir el cupn correspondiente y llevarlo con su receta a la farmacia.  - Tambin puede pasar por nuestra oficina durante el horario de atencin regular y education officer, museum una tarjeta de cupones de GoodRx.  - Si necesita que su receta se enve electrnicamente a una farmacia diferente, informe a nuestra oficina a travs de MyChart de [place] o por telfono llamando al [phone] y presione la opcin 4.

## 2024-09-08 NOTE — Progress Notes (Signed)
 "   Subjective   Lawrence Wells is a 69 y.o. male who presents for the following: Total body skin exam for skin cancer screening and mole check. The patient has spots, moles and lesions to be evaluated, some may be new or changing and the patient may have concern these could be cancer.. Patient is new patient  Today patient reports: LOC on the chest x2  Itching all over the body Itchy scaly scalp; was given keto shampoo and triamcinolone  lotion by PCP  Review of Systems:    No other skin or systemic complaints except as noted in HPI or Assessment and Plan.  The following portions of the chart were reviewed this encounter and updated as appropriate: medications, allergies, medical history  Relevant Medical History:  n/a   Objective  (SKPE) Well appearing patient in no apparent distress; mood and affect are within normal limits. Examination was performed of the: Full Skin Examination: scalp, head, eyes, ears, nose, lips, neck, chest, axillae, abdomen, back, buttocks, bilateral upper extremities, bilateral lower extremities, hands, feet, fingers, toes, fingernails, and toenails.   Examination notable for: SKIN EXAM, Angioma(s): Scattered red vascular papule(s)  , Lentigo/lentigines: Scattered pigmented macules that are tan to brown in color and are somewhat non-uniform in shape and concentrated in the sun-exposed areas, Nevus/nevi: Scattered well-demarcated, regular, pigmented macule(s) and/or papule(s)  , Seborrheic Keratosis(es): Stuck-on appearing keratotic papule(s) on the trunk, none  irritated with redness, crusting, edema, and/or partial avulsion, Actinic Damage/Elastosis: chronic sun damage: dyspigmentation, telangiectasia, and wrinkling, Xerosis: Diffuse xerosis and scale accentuated on the extremities   Examination limited by: Undergarments and Patient deferred removal     Right Chest 1cm irregular brown plaque Right Upper Arm - Anterior 7mm irregular brown macule  Left Chest 9mm  irregular brown macule  Right Upper Back 1cm irregular brown patch  Right Mid Back 9mm irregular brown macule   Assessment & Plan  (SKAP)   SKIN CANCER SCREENING PERFORMED TODAY.  BENIGN SKIN FINDINGS  - Lentigines  - Seborrheic keratoses  - Hemangiomas   - Nevus/Multiple Benign Nevi  - Reassurance provided regarding the benign appearance of lesions noted on exam today; no treatment is indicated in the absence of symptoms/changes. - Reinforced importance of photoprotective strategies including liberal and frequent sunscreen use of a broad-spectrum SPF 30 or greater, use of protective clothing, and sun avoidance for prevention of cutaneous malignancy and photoaging.  Counseled patient on the importance of regular self-skin monitoring as well as routine clinical skin examinations as scheduled.   ACTINIC DAMAGE - Chronic condition, secondary to cumulative UV/sun exposure - Recommend daily broad spectrum sunscreen SPF 30+ to sun-exposed areas, reapply every 2 hours as needed.  - Staying in the shade or wearing long sleeves, sun glasses (UVA+UVB protection) and wide brim hats (4-inch brim around the entire circumference of the hat) are also recommended for sun protection.  - Call for new or changing lesions.  Mild atopic dermatitis  Xerosis cutis - Discussed diagnosis, typical course, and treatment options for this condition - The patient was advised to use a gentle cleanser such as Dove or Cetaphil and to avoid anti-bacterial soaps which may be too irritating. We recommended the frequent use of a greasy emollient such as Cetaphil, Cerave or Vaseline to moisturize the skin once to twice daily. We also recommended the avoidance of scratching and irritants to the skin. start triamcinolone  ointment 0.1% twice daily to affected areas of skin Discussed side effect of potent topical steroids including atrophy,  dyspigmentation, striae, telangectasia, folliculitis, loss of skin pigment, hair growth,  tachyphylaxis, risk of systemic absorption with missuse. - Start clobetasol  ointment 0.05% twice daily to affected skin Discussed side effect of super potent topical steroids including atrophy, dyspigmentation, striae, telangectasia, folliculitis, loss of skin pigment, hair growth, tachyphylaxis, risk of systemic absorption with missuse.    Was sun protection counseling provided?: Yes   Level of service outlined above   Patient instructions (SKPI)   Procedures, orders, diagnosis for this visit:  NEOPLASM OF UNCERTAIN BEHAVIOR (5) Right Chest - Skin / nail biopsy Type of biopsy: tangential   Informed consent: discussed and consent obtained   Timeout: patient name, date of birth, surgical site, and procedure verified   Procedure prep:  Patient was prepped and draped in usual sterile fashion Prep type:  Isopropyl alcohol Anesthesia: the lesion was anesthetized in a standard fashion   Anesthetic:  1% lidocaine  w/ epinephrine  1-100,000 buffered w/ 8.4% NaHCO3 Instrument used: DermaBlade   Hemostasis achieved with: pressure and aluminum chloride   Outcome: patient tolerated procedure well   Post-procedure details: sterile dressing applied and wound care instructions given   Dressing type: bandage and petrolatum    Specimen 1 - Surgical pathology Differential Diagnosis: Melanoma vs Other   Check Margins: No Right Upper Arm - Anterior - Skin / nail biopsy Type of biopsy: tangential   Informed consent: discussed and consent obtained   Timeout: patient name, date of birth, surgical site, and procedure verified   Procedure prep:  Patient was prepped and draped in usual sterile fashion Prep type:  Isopropyl alcohol Anesthesia: the lesion was anesthetized in a standard fashion   Anesthetic:  1% lidocaine  w/ epinephrine  1-100,000 buffered w/ 8.4% NaHCO3 Instrument used: DermaBlade   Hemostasis achieved with: pressure and aluminum chloride   Outcome: patient tolerated procedure well    Post-procedure details: sterile dressing applied and wound care instructions given   Dressing type: bandage and petrolatum    Specimen 2 - Surgical pathology Differential Diagnosis: Melanoma vs other   Check Margins: No Left Chest - Skin / nail biopsy Type of biopsy: tangential   Informed consent: discussed and consent obtained   Timeout: patient name, date of birth, surgical site, and procedure verified   Procedure prep:  Patient was prepped and draped in usual sterile fashion Prep type:  Isopropyl alcohol Anesthesia: the lesion was anesthetized in a standard fashion   Anesthetic:  1% lidocaine  w/ epinephrine  1-100,000 buffered w/ 8.4% NaHCO3 Instrument used: DermaBlade   Hemostasis achieved with: pressure and aluminum chloride   Outcome: patient tolerated procedure well   Post-procedure details: sterile dressing applied and wound care instructions given   Dressing type: bandage and petrolatum    Specimen 3 - Surgical pathology Differential Diagnosis: Melanoma vs other   Check Margins: No Right Upper Back - Skin / nail biopsy Type of biopsy: tangential   Informed consent: discussed and consent obtained   Timeout: patient name, date of birth, surgical site, and procedure verified   Procedure prep:  Patient was prepped and draped in usual sterile fashion Prep type:  Isopropyl alcohol Anesthesia: the lesion was anesthetized in a standard fashion   Anesthetic:  1% lidocaine  w/ epinephrine  1-100,000 buffered w/ 8.4% NaHCO3 Instrument used: DermaBlade   Hemostasis achieved with: pressure and aluminum chloride   Outcome: patient tolerated procedure well   Post-procedure details: sterile dressing applied and wound care instructions given   Dressing type: bandage and petrolatum    Specimen 4 - Surgical  pathology Differential Diagnosis: Melanoma vs other   Check Margins: No Right Mid Back - Skin / nail biopsy Type of biopsy: tangential   Informed consent: discussed and  consent obtained   Timeout: patient name, date of birth, surgical site, and procedure verified   Procedure prep:  Patient was prepped and draped in usual sterile fashion Prep type:  Isopropyl alcohol Anesthesia: the lesion was anesthetized in a standard fashion   Anesthetic:  1% lidocaine  w/ epinephrine  1-100,000 buffered w/ 8.4% NaHCO3 Instrument used: DermaBlade   Hemostasis achieved with: pressure and aluminum chloride   Outcome: patient tolerated procedure well   Post-procedure details: sterile dressing applied and wound care instructions given   Dressing type: bandage and petrolatum    Specimen 5 - Surgical pathology Differential Diagnosis: Melanoma vs other   Check Margins: No  Neoplasm of uncertain behavior -     Skin / nail biopsy -     Skin / nail biopsy -     Skin / nail biopsy -     Skin / nail biopsy -     Skin / nail biopsy -     Surgical pathology; Standing  Other orders -     Clobetasol  Propionate; Apply 1 gram topically to affected area of skin twice daily. Stop once resolved and restart as needed for flares. Avoid use on face, armpits, groin unless otherwise indicated.  Dispense: 60 g; Refill: 5 -     Triamcinolone  Acetonide; Apply 7 grams twice daily to affected areas of skin. Stop once resolved and restart as needed for flares. Avoid use on face, armpits, groin unless otherwise indicated.  Dispense: 454 g; Refill: 2    Return to clinic: Return in about 3 months (around 12/07/2024) for TBSE.  I, Emerick Ege, CMA am acting as scribe for Lauraine JAYSON Kanaris, MD.  Documentation: I have reviewed the above documentation for accuracy and completeness, and I agree with the above.  Lauraine JAYSON Kanaris, MD  "

## 2024-09-10 LAB — SURGICAL PATHOLOGY

## 2024-09-14 ENCOUNTER — Ambulatory Visit: Payer: Self-pay

## 2024-09-14 NOTE — Telephone Encounter (Signed)
Patient advised of BX results and scheduled for EDC. aw 

## 2024-09-14 NOTE — Progress Notes (Signed)
 Patient states this was not a good time to talk and he will call back to discuss results.

## 2024-09-29 ENCOUNTER — Ambulatory Visit

## 2024-11-22 ENCOUNTER — Ambulatory Visit: Admitting: Student in an Organized Health Care Education/Training Program

## 2024-11-22 ENCOUNTER — Encounter

## 2024-12-08 ENCOUNTER — Ambulatory Visit

## 2025-03-02 ENCOUNTER — Ambulatory Visit: Admitting: Family Medicine

## 2025-03-25 ENCOUNTER — Ambulatory Visit

## 2025-03-30 ENCOUNTER — Ambulatory Visit
